# Patient Record
Sex: Male | Born: 1955
Health system: Southern US, Community
[De-identification: ages and names within clinical notes are randomized; demographics above are authoritative.]

## PROBLEM LIST (undated history)

## (undated) DIAGNOSIS — R7989 Other specified abnormal findings of blood chemistry: Secondary | ICD-10-CM

## (undated) DIAGNOSIS — J189 Pneumonia, unspecified organism: Secondary | ICD-10-CM

## (undated) DIAGNOSIS — I4891 Unspecified atrial fibrillation: Secondary | ICD-10-CM

## (undated) DIAGNOSIS — E119 Type 2 diabetes mellitus without complications: Secondary | ICD-10-CM

## (undated) DIAGNOSIS — K219 Gastro-esophageal reflux disease without esophagitis: Secondary | ICD-10-CM

## (undated) DIAGNOSIS — K760 Fatty (change of) liver, not elsewhere classified: Secondary | ICD-10-CM

## (undated) DIAGNOSIS — I509 Heart failure, unspecified: Secondary | ICD-10-CM

## (undated) DIAGNOSIS — G4730 Sleep apnea, unspecified: Secondary | ICD-10-CM

## (undated) DIAGNOSIS — I10 Essential (primary) hypertension: Secondary | ICD-10-CM

## (undated) DIAGNOSIS — R7303 Prediabetes: Secondary | ICD-10-CM

## (undated) DIAGNOSIS — D126 Benign neoplasm of colon, unspecified: Secondary | ICD-10-CM

## (undated) DIAGNOSIS — E559 Vitamin D deficiency, unspecified: Secondary | ICD-10-CM

## (undated) DIAGNOSIS — G4700 Insomnia, unspecified: Secondary | ICD-10-CM

## (undated) DIAGNOSIS — R0600 Dyspnea, unspecified: Secondary | ICD-10-CM

## (undated) DIAGNOSIS — R0602 Shortness of breath: Secondary | ICD-10-CM

## (undated) HISTORY — PX: OTHER SURGICAL HISTORY: SHX169

## (undated) HISTORY — PX: CHOLECYSTECTOMY: SHX55

## (undated) HISTORY — DX: Sleep apnea, unspecified: G47.30

## (undated) HISTORY — DX: Vitamin D deficiency, unspecified: E55.9

## (undated) HISTORY — DX: Prediabetes: R73.03

## (undated) HISTORY — DX: Shortness of breath: R06.02

## (undated) HISTORY — DX: Type 2 diabetes mellitus without complications: E11.9

## (undated) HISTORY — PX: COLONOSCOPY: SHX174

## (undated) HISTORY — DX: Benign neoplasm of colon, unspecified: D12.6

## (undated) HISTORY — DX: Other specified abnormal findings of blood chemistry: R79.89

## (undated) HISTORY — DX: Fatty (change of) liver, not elsewhere classified: K76.0

---

## 1998-03-03 ENCOUNTER — Emergency Department (HOSPITAL_COMMUNITY): Admission: EM | Admit: 1998-03-03 | Discharge: 1998-03-03 | Payer: Self-pay | Admitting: Emergency Medicine

## 1999-10-01 ENCOUNTER — Emergency Department (HOSPITAL_COMMUNITY): Admission: EM | Admit: 1999-10-01 | Discharge: 1999-10-01 | Payer: Self-pay | Admitting: *Deleted

## 2000-08-26 ENCOUNTER — Emergency Department (HOSPITAL_COMMUNITY): Admission: EM | Admit: 2000-08-26 | Discharge: 2000-08-26 | Payer: Self-pay | Admitting: Emergency Medicine

## 2000-08-26 ENCOUNTER — Encounter: Payer: Self-pay | Admitting: Emergency Medicine

## 2001-05-22 ENCOUNTER — Encounter: Payer: Self-pay | Admitting: Emergency Medicine

## 2001-05-22 ENCOUNTER — Emergency Department (HOSPITAL_COMMUNITY): Admission: EM | Admit: 2001-05-22 | Discharge: 2001-05-22 | Payer: Self-pay | Admitting: Emergency Medicine

## 2002-07-07 ENCOUNTER — Emergency Department (HOSPITAL_COMMUNITY): Admission: EM | Admit: 2002-07-07 | Discharge: 2002-07-07 | Payer: Self-pay

## 2003-09-07 ENCOUNTER — Emergency Department (HOSPITAL_COMMUNITY): Admission: EM | Admit: 2003-09-07 | Discharge: 2003-09-07 | Payer: Self-pay | Admitting: *Deleted

## 2003-09-09 ENCOUNTER — Emergency Department (HOSPITAL_COMMUNITY): Admission: EM | Admit: 2003-09-09 | Discharge: 2003-09-09 | Payer: Self-pay | Admitting: Emergency Medicine

## 2004-09-22 ENCOUNTER — Inpatient Hospital Stay (HOSPITAL_COMMUNITY): Admission: EM | Admit: 2004-09-22 | Discharge: 2004-09-24 | Payer: Self-pay | Admitting: Emergency Medicine

## 2004-10-23 ENCOUNTER — Emergency Department (HOSPITAL_COMMUNITY): Admission: EM | Admit: 2004-10-23 | Discharge: 2004-10-23 | Payer: Self-pay | Admitting: *Deleted

## 2005-03-27 ENCOUNTER — Emergency Department (HOSPITAL_COMMUNITY): Admission: EM | Admit: 2005-03-27 | Discharge: 2005-03-27 | Payer: Self-pay | Admitting: Emergency Medicine

## 2005-04-27 ENCOUNTER — Ambulatory Visit: Payer: Self-pay | Admitting: Internal Medicine

## 2005-05-04 ENCOUNTER — Ambulatory Visit: Payer: Self-pay | Admitting: Family Medicine

## 2005-05-04 ENCOUNTER — Ambulatory Visit: Payer: Self-pay | Admitting: Internal Medicine

## 2005-05-06 ENCOUNTER — Ambulatory Visit: Payer: Self-pay | Admitting: *Deleted

## 2005-05-11 ENCOUNTER — Ambulatory Visit: Payer: Self-pay | Admitting: Internal Medicine

## 2005-07-25 ENCOUNTER — Emergency Department (HOSPITAL_COMMUNITY): Admission: AD | Admit: 2005-07-25 | Discharge: 2005-07-25 | Payer: Self-pay | Admitting: Family Medicine

## 2006-01-24 ENCOUNTER — Ambulatory Visit: Payer: Self-pay | Admitting: Internal Medicine

## 2006-02-22 ENCOUNTER — Ambulatory Visit: Payer: Self-pay | Admitting: Internal Medicine

## 2006-04-16 ENCOUNTER — Emergency Department (HOSPITAL_COMMUNITY): Admission: EM | Admit: 2006-04-16 | Discharge: 2006-04-16 | Payer: Self-pay | Admitting: Emergency Medicine

## 2006-12-26 ENCOUNTER — Ambulatory Visit: Payer: Self-pay | Admitting: Family Medicine

## 2007-02-27 ENCOUNTER — Ambulatory Visit: Payer: Self-pay | Admitting: Family Medicine

## 2007-05-24 ENCOUNTER — Ambulatory Visit: Payer: Self-pay | Admitting: Internal Medicine

## 2007-05-24 LAB — CONVERTED CEMR LAB
ALT: 28 units/L (ref 0–53)
AST: 23 units/L (ref 0–37)
Chloride: 105 meq/L (ref 96–112)
Cholesterol: 188 mg/dL (ref 0–200)
Creatinine, Ser: 1.21 mg/dL (ref 0.40–1.50)
Glucose, Bld: 99 mg/dL (ref 70–99)
LDL Cholesterol: 67 mg/dL (ref 0–99)
Microalb, Ur: 7.87 mg/dL — ABNORMAL HIGH (ref 0.00–1.89)
Sodium: 138 meq/L (ref 135–145)
Total Bilirubin: 0.4 mg/dL (ref 0.3–1.2)
Total Protein: 7.2 g/dL (ref 6.0–8.3)

## 2007-06-28 ENCOUNTER — Encounter (INDEPENDENT_AMBULATORY_CARE_PROVIDER_SITE_OTHER): Payer: Self-pay | Admitting: *Deleted

## 2008-04-08 ENCOUNTER — Ambulatory Visit: Payer: Self-pay | Admitting: Internal Medicine

## 2008-04-08 ENCOUNTER — Encounter (INDEPENDENT_AMBULATORY_CARE_PROVIDER_SITE_OTHER): Payer: Self-pay | Admitting: Family Medicine

## 2008-04-08 LAB — CONVERTED CEMR LAB
BUN: 20 mg/dL (ref 6–23)
CO2: 21 meq/L (ref 19–32)
Calcium: 9.5 mg/dL (ref 8.4–10.5)
Cholesterol: 181 mg/dL (ref 0–200)
HDL: 60 mg/dL (ref >39)
Hemoglobin: 14.8 g/dL (ref 13.0–17.0)
LDL Cholesterol: 83 mg/dL (ref 0–99)
Lymphocytes Relative: 41 % (ref 12–46)
MCHC: 32.2 g/dL (ref 30.0–36.0)
MCV: 87.3 fL (ref 78.0–100.0)
Monocytes Relative: 8 % (ref 3–12)
Neutro Abs: 2.9 10*3/uL (ref 1.7–7.7)
Potassium: 4.3 meq/L (ref 3.5–5.3)
RDW: 15.5 % (ref 11.5–15.5)
Sodium: 140 meq/L (ref 135–145)
Total CHOL/HDL Ratio: 3
Triglycerides: 191 mg/dL — ABNORMAL HIGH (ref <150)
VLDL: 38 mg/dL (ref 0–40)
WBC: 6.1 10*3/uL (ref 4.0–10.5)

## 2008-04-11 ENCOUNTER — Ambulatory Visit (HOSPITAL_COMMUNITY): Admission: RE | Admit: 2008-04-11 | Discharge: 2008-04-11 | Payer: Self-pay | Admitting: Internal Medicine

## 2008-05-08 ENCOUNTER — Ambulatory Visit: Payer: Self-pay | Admitting: Internal Medicine

## 2009-05-07 ENCOUNTER — Ambulatory Visit: Payer: Self-pay | Admitting: Internal Medicine

## 2009-05-09 ENCOUNTER — Ambulatory Visit (HOSPITAL_COMMUNITY): Admission: RE | Admit: 2009-05-09 | Discharge: 2009-05-09 | Payer: Self-pay | Admitting: Internal Medicine

## 2009-05-21 ENCOUNTER — Ambulatory Visit: Payer: Self-pay | Admitting: Internal Medicine

## 2009-05-21 ENCOUNTER — Encounter (INDEPENDENT_AMBULATORY_CARE_PROVIDER_SITE_OTHER): Payer: Self-pay | Admitting: Adult Health

## 2009-05-21 LAB — CONVERTED CEMR LAB
Alkaline Phosphatase: 77 units/L (ref 39–117)
Basophils Relative: 1 % (ref 0–1)
CO2: 23 meq/L (ref 19–32)
Chloride: 108 meq/L (ref 96–112)
Eosinophils Absolute: 0.2 10*3/uL (ref 0.0–0.7)
Eosinophils Relative: 2 % (ref 0–5)
Hemoglobin: 14.8 g/dL (ref 13.0–17.0)
Lymphocytes Relative: 41 % (ref 12–46)
Monocytes Absolute: 0.6 10*3/uL (ref 0.1–1.0)
Monocytes Relative: 8 % (ref 3–12)
Neutro Abs: 3.6 10*3/uL (ref 1.7–7.7)
RBC: 5.4 M/uL (ref 4.22–5.81)
RDW: 16.7 % — ABNORMAL HIGH (ref 11.5–15.5)
Sodium: 140 meq/L (ref 135–145)
TSH: 0.869 microintl units/mL (ref 0.350–4.500)
Total CHOL/HDL Ratio: 3.4
Triglycerides: 159 mg/dL — ABNORMAL HIGH (ref <150)

## 2009-08-02 ENCOUNTER — Emergency Department (HOSPITAL_COMMUNITY): Admission: EM | Admit: 2009-08-02 | Discharge: 2009-08-02 | Payer: Self-pay | Admitting: Emergency Medicine

## 2009-08-03 ENCOUNTER — Emergency Department (HOSPITAL_COMMUNITY): Admission: EM | Admit: 2009-08-03 | Discharge: 2009-08-03 | Payer: Self-pay | Admitting: Emergency Medicine

## 2009-08-06 ENCOUNTER — Emergency Department (HOSPITAL_COMMUNITY): Admission: EM | Admit: 2009-08-06 | Discharge: 2009-08-06 | Payer: Self-pay | Admitting: Emergency Medicine

## 2009-08-08 ENCOUNTER — Ambulatory Visit: Payer: Self-pay | Admitting: Internal Medicine

## 2009-08-08 LAB — CONVERTED CEMR LAB
ALT: 200 units/L — ABNORMAL HIGH (ref 0–53)
Albumin: 3.9 g/dL (ref 3.5–5.2)
Alkaline Phosphatase: 191 units/L — ABNORMAL HIGH (ref 39–117)
BUN: 18 mg/dL (ref 6–23)
Chloride: 102 meq/L (ref 96–112)
Cholesterol: 149 mg/dL (ref 0–200)
Creatinine, Ser: 1 mg/dL (ref 0.40–1.50)
Glucose, Bld: 99 mg/dL (ref 70–99)
HDL: 41 mg/dL (ref >39)
LDL Cholesterol: 83 mg/dL (ref 0–99)
Potassium: 4.7 meq/L (ref 3.5–5.3)
Sodium: 139 meq/L (ref 135–145)
Total CHOL/HDL Ratio: 3.6
VLDL: 25 mg/dL (ref 0–40)

## 2009-08-12 ENCOUNTER — Encounter (INDEPENDENT_AMBULATORY_CARE_PROVIDER_SITE_OTHER): Payer: Self-pay | Admitting: Internal Medicine

## 2009-08-12 LAB — CONVERTED CEMR LAB: Hep B Core Total Ab: NEGATIVE

## 2010-04-03 ENCOUNTER — Ambulatory Visit: Payer: Self-pay | Admitting: Internal Medicine

## 2010-04-03 LAB — CONVERTED CEMR LAB
Alkaline Phosphatase: 81 units/L (ref 39–117)
CO2: 22 meq/L (ref 19–32)
Calcium: 9.2 mg/dL (ref 8.4–10.5)
Microalb, Ur: 0.84 mg/dL (ref 0.00–1.89)
Total Bilirubin: 0.3 mg/dL (ref 0.3–1.2)
Total CHOL/HDL Ratio: 4.3
Total Protein: 7.3 g/dL (ref 6.0–8.3)
Triglycerides: 375 mg/dL — ABNORMAL HIGH (ref <150)

## 2010-09-29 ENCOUNTER — Emergency Department (HOSPITAL_COMMUNITY)
Admission: EM | Admit: 2010-09-29 | Discharge: 2010-09-29 | Payer: Self-pay | Source: Home / Self Care | Admitting: Family Medicine

## 2011-02-24 ENCOUNTER — Other Ambulatory Visit: Payer: Self-pay | Admitting: Internal Medicine

## 2011-02-26 NOTE — Discharge Summary (Signed)
NAME:  Christopher Wilkins, Christopher Wilkins NO.:  [id number]   MEDICAL RECORD NO.:  [id number]          PATIENT TYPE:  INP   LOCATION:  0445                         FACILITY:  [facility]   PHYSICIAN:  Isidor Holts, M.D.  DATE OF BIRTH:  [birth year]   DATE OF ADMISSION:  09/22/2004  DATE OF DISCHARGE:  09/24/2004                                 DISCHARGE SUMMARY   PRIMARY CARE PHYSICIAN:  Unassigned.   DIAGNOSES:  1.  Acute pancreatitis.  2.  Hypertension.  3.  ETOH/substance abuse.   DISCHARGE MEDICATIONS:  1.  Protonix 40 mg p.o. daily for 1 week only.  2.  Lisinopril 40 mg p.o. daily.  3.  Ultram 50 mg p.o. p.r.n. q.8h.  One week's supply is dispensed.   PROCEDURE:  1.  Abdominal x-ray/chest x-ray dated September 22, 2004:  This shows normal      cardiac silhouette, mediastinum and pulmonary vasculature.  There is no      evidence of gas under either hemidiaphragm.  2.  Flat and upright abdominal images reveal normal stool and bowel gas      pattern.  No evidence of pneumoperitoneum.  Surgical clips are seen in      the region of the gallbladder.  Degenerative changes are seen in the      lumbar spine, i.e., unremarkable acute abdominal series.  3.  Pelvis, there is no gross organomegaly.   CONSULTATION:  None.   ADMISSION HISTORY:  As in H&P notes of September 22, 2004.  However, in  brief, this is a 55 year old male with a history of possible hypertension,  also status post laparoscopic cholecystectomy approximately 5 years ago,  presenting with upper abdominal pain of approximately 1 week's duration,  associated with vomiting and reduced appetite.  The patient also had a  transient episode of watery diarrhea which had resolved at the time of  admission.  He admitted to binge drinking and also smokes cocaine rolled up  in marijuana.  Initial laboratory test done in the emergency department  revealed elevated amylase at 277 and lipase at 108.  Abdominal series was  quite  unremarkable.  The patient was therefore admitted for further  evaluation, investigation, and treatment.   CLINICAL COURSE:  1.  Acute pancreatitis.  The patient was kept NPO initially, managed with      intravenous fluids, analgesics, antiemetics, and proton pump inhibitor      with satisfactory response.  Subsequently, abdominal pain subsided, and      no further episodes of emesis were noted.  The patient regained his      appetite and by September 23, 2004, was able to manage clear fluids with      gradual broadening to low fat diet which he also tolerated.   1.  Hypertension.  The patient's blood pressure at the time of admission was      mildly elevated at 152/94 mmHg.  He was therefore managed with ACE      inhibitor with gradual reduction of systolic BP to 144, as of September 24, 2004.  He has been recommended a low salt diet, and he is expected      to continue ACE inhibitor treatment until reevaluated by his PMD and      monitored on an outpatient basis.   1.  History of alcohol abuse/substance abuse.  The patient has been      counseled while in-hospital, and he has assured me that he will abstain      from these substances.  He understands that, in particular, alcohol may      be contributory to his episode of acute pancreatitis.   DISPOSITION:  The patient was subsequently discharged in satisfactory  condition on September 25, 2004.   ACTIVITY:  As tolerated.  He has been recommended out of work for  approximately 1week.   DIET:  A low fat diet for at least 1 week is recommended.   He has been cautioned to avoid alcohol and illegal substances.  He is to  call [facility] and establish a follow up appointment within 2 weeks.  He  has verbalized understanding.     Chri   CO/MEDQ  D:  09/28/2004  T:  09/28/2004  Job:  [id number]

## 2011-02-26 NOTE — H&P (Signed)
NAME:  Christopher Wilkins, Christopher Wilkins NO.:  [id number]   MEDICAL RECORD NO.:  [id number]          PATIENT TYPE:  EMS   LOCATION:  ED                           FACILITY:  [facility]   PHYSICIAN:  Isidor Holts, M.D.  DATE OF BIRTH:  Oct 11, 1956   DATE OF ADMISSION:  09/22/2004  DATE OF DISCHARGE:                                HISTORY & PHYSICAL   PMD:  Unassigned.   CHIEF COMPLAINT:  Abdominal pain/vomiting approximately one week.   HISTORY OF PRESENT ILLNESS:  This is a 55 year old male with no significant  past medical history who states that he developed upper abdominal pain  approximately one week ago, associated with vomiting and reduced appetite.  He also had watery diarrhea on September 21, 2004 now resolved.  The last  episode of vomiting was on September 21, 2004.  Denies clustering of cases or  any unusual foods.  Admits to binge drinking, usually 3-4 cans of beer on  the weekends. His last drink, however, was on September 21, 2004. He also  smoked cocaine rolled up with marijuana on September 21, 2004.  He called EMS  today as he was not getting any better.   PAST MEDICAL HISTORY:  1.  Status post laparoscopic cholecystectomy approximately five years ago.  2.  Query hypertension.   MEDICATIONS:  Not taking any regular medications.   ALLERGIES:  No known drug allergies, however, morphine causes nausea.   SOCIAL HISTORY:  Lives alone, divorced, has one offspring who is alive and  well, has two brothers and one sister, all of whom are alive and well. The  patient is a smoker and smokes approximately 1 pack of cigarettes per day  for the past 7-8 years, drinks alcohol on the weekends, about 3-4 cans of  beer according to patient. He also utilizes cocaine and marijuana.   FAMILY HISTORY:  Otherwise noncontributory.   REVIEW OF SYMPTOMS:  Essentially as per HPI and chief complaint otherwise  negative.   PHYSICAL EXAMINATION:  VITAL SIGNS:  Temperature 98.5,  blood  pressure  152/94, respiratory rate 22, pulse 77.  Pulse oximeter 97% on room air.  GENERAL:  Not in any acute distress. After analgesics are administered in  the emergency department, he is drowsy but is easily arousable. Not short of  breath at rest.  HEENT:  No clinical pallor or jaundice. No conjunctival injection.  NECK:  Supple.  JVD not seen.  No palpable lymphadenopathy, no palpable  goiter.  CHEST:  Clear to auscultation.  No wheezes, no crackles.  HEART:  Heart sounds 1 and 2 heard.  Normal rhythm, no murmurs.  ABDOMEN:  Moderately obese, soft, moderately tender in the epigastrium, no  guarding, no rebound.  No palpable organomegaly. Normal bowel sounds.  EXTREMITIES:  Lower extremity is quite unremarkable.  MUSCULOSKELETAL:  Unremarkable.  CENTRAL NERVOUS SYSTEM:  Reveals no focal neurologic deficits on gross  examination.   LABORATORY DATA:  CBC, WBC is 6.9, hemoglobin 15.3, hematocrit 46.0.  Platelets 320.  Electrolytes, sodium 139, potassium 3.7, chloride 108, CO2  26, BUN 7, creatinine 1.0, glucose  104.  AST 16, ALT 17, alkaline  phosphatase 73.  Amylase 277, lipase 108.   Abdominal series dated September 22, 2004 is quite unremarkable.   ASSESSMENT/PLAN:  1.  Acute pancreatitis. Will admit for observation and management. The      patient to be n.p.o. for now, apart from medications.  Will administer      intravenous fluids i.e. normal saline with potassium supplementation.      Also analgesics p.r.n.  Proton pump inhibitors and antiemetics as      needed. We will monitor amylase and lipase levels.   1.  Hypertension.  Mildly elevated. Will manage with ACE inhibitor.   1.  History of alcohol excess.  We will watch for withdrawal and administer      Ativan withdrawal protocol as needed.   Further management will depend on clinical course.     Chri   CO/MEDQ  D:  09/22/2004  T:  09/22/2004  Job:  [id number]

## 2011-09-28 ENCOUNTER — Other Ambulatory Visit (HOSPITAL_COMMUNITY): Payer: Self-pay | Admitting: Family Medicine

## 2011-09-28 ENCOUNTER — Ambulatory Visit (HOSPITAL_COMMUNITY)
Admission: RE | Admit: 2011-09-28 | Discharge: 2011-09-28 | Disposition: A | Discharge status: Home / Self Care | Payer: Self-pay | Source: Ambulatory Visit | Attending: Family Medicine | Admitting: Family Medicine

## 2011-09-28 DIAGNOSIS — M25569 Pain in unspecified knee: Secondary | ICD-10-CM | POA: Insufficient documentation

## 2011-09-28 DIAGNOSIS — R52 Pain, unspecified: Secondary | ICD-10-CM

## 2011-09-28 DIAGNOSIS — Z1810 Retained metal fragments, unspecified: Secondary | ICD-10-CM | POA: Insufficient documentation

## 2012-09-07 ENCOUNTER — Encounter (HOSPITAL_COMMUNITY): Payer: Self-pay | Admitting: Emergency Medicine

## 2012-09-07 ENCOUNTER — Emergency Department (HOSPITAL_COMMUNITY): Payer: Self-pay

## 2012-09-07 ENCOUNTER — Emergency Department (HOSPITAL_COMMUNITY)
Admission: EM | Admit: 2012-09-07 | Discharge: 2012-09-07 | Disposition: A | Discharge status: Home / Self Care | Payer: Self-pay | Attending: Emergency Medicine | Admitting: Emergency Medicine

## 2012-09-07 DIAGNOSIS — Z7982 Long term (current) use of aspirin: Secondary | ICD-10-CM | POA: Insufficient documentation

## 2012-09-07 DIAGNOSIS — R42 Dizziness and giddiness: Secondary | ICD-10-CM | POA: Insufficient documentation

## 2012-09-07 DIAGNOSIS — H538 Other visual disturbances: Secondary | ICD-10-CM | POA: Insufficient documentation

## 2012-09-07 DIAGNOSIS — I10 Essential (primary) hypertension: Secondary | ICD-10-CM | POA: Insufficient documentation

## 2012-09-07 DIAGNOSIS — R51 Headache: Secondary | ICD-10-CM

## 2012-09-07 DIAGNOSIS — F17200 Nicotine dependence, unspecified, uncomplicated: Secondary | ICD-10-CM | POA: Insufficient documentation

## 2012-09-07 DIAGNOSIS — Z79899 Other long term (current) drug therapy: Secondary | ICD-10-CM | POA: Insufficient documentation

## 2012-09-07 HISTORY — DX: Essential (primary) hypertension: I10

## 2012-09-07 MED ORDER — HYDROMORPHONE HCL PF 1 MG/ML IJ SOLN
0.5000 mg | Freq: Once | INTRAMUSCULAR | Status: AC
  Administered 2012-09-07: 0.5 mg via INTRAVENOUS
  Filled 2012-09-07: qty 1

## 2012-09-07 MED ORDER — METOCLOPRAMIDE HCL 5 MG/ML IJ SOLN
10.0000 mg | Freq: Once | INTRAMUSCULAR | Status: AC
  Administered 2012-09-07: 10 mg via INTRAVENOUS
  Filled 2012-09-07: qty 2

## 2012-09-07 MED ORDER — DIPHENHYDRAMINE HCL 50 MG/ML IJ SOLN
25.0000 mg | Freq: Once | INTRAMUSCULAR | Status: AC
  Administered 2012-09-07: 25 mg via INTRAVENOUS
  Filled 2012-09-07: qty 1

## 2012-09-07 MED ORDER — LISINOPRIL-HYDROCHLOROTHIAZIDE 20-12.5 MG PO TABS: 1.0000 | ORAL_TABLET | Freq: Every day | ORAL | Status: DC

## 2012-09-07 MED ORDER — IOHEXOL 350 MG/ML SOLN
50.0000 mL | Freq: Once | INTRAVENOUS | Status: AC | PRN
  Administered 2012-09-07: 50 mL via INTRAVENOUS

## 2012-09-07 NOTE — ED Notes (Signed)
Patient transported to CT 

## 2012-09-07 NOTE — ED Provider Notes (Signed)
History     CSN: [id number]  Arrival date & time 09/07/12  1003   First MD Initiated Contact with Patient 09/07/12 1015     Chief Complaint  Patient presents with  . Hypertension   HPI: Christopher Wilkins is a 56 yo AAM with history of HTN, who ran out of his blood pressure medications two weeks ago presents with acute onset occipital headache. He was watching TV when his headache started. Pain is located in the occiput, described as pressure, non-radiating, 9/10. No alleviating or exacerbating factors. He has not taken any medications for pain. Symptoms are associated with dizziness, described as lightheadedness, but not vertigo. Mild blurred vision but no double vision. He denies neck pain. He has had a HA similar to this previously several years ago when he first was diagnosed with hypertension, he is unsure of the results of this scan. He does not normally have headaches. He does not take any anticoagulants.   Past Medical History  Diagnosis Date  . Hypertension     Past Surgical History  Procedure Date  . Cholecystectomy   . Other surgical history     "Shot a couple of times"    No family history on file.  History  Substance Use Topics  . Smoking status: Current Every Day Smoker -- 0.2 packs/day    Types: Cigarettes  . Smokeless tobacco: Not on file  . Alcohol Use: Yes     Comment: occasionally     Review of Systems  Constitutional: Negative for fever, chills, appetite change and fatigue.  HENT: Negative for neck pain and neck stiffness.   Eyes: Positive for visual disturbance (blurred vision). Negative for photophobia.  Respiratory: Negative for cough and shortness of breath.   Cardiovascular: Negative for chest pain and palpitations.  Gastrointestinal: Negative for nausea, vomiting, abdominal pain and constipation.  Genitourinary: Negative for dysuria, frequency, decreased urine volume and enuresis.  Musculoskeletal: Negative for myalgias, arthralgias and gait problem.    Skin: Negative for pallor and rash.  Neurological: Positive for dizziness and headaches.  Psychiatric/Behavioral: Negative for confusion and agitation.  All other systems reviewed and are negative.   Allergies  Codeine  Home Medications   Current Outpatient Rx  Name  Route  Sig  Dispense  Refill  . ASPIRIN EC 81 MG PO TBEC   Oral   Take 81 mg by mouth daily.         Marland Kitchen LISINOPRIL-HYDROCHLOROTHIAZIDE 20-12.5 MG PO TABS   Oral   Take 1 tablet by mouth daily.           BP 175/94  Pulse 93  Temp 97.8 F (36.6 C) (Oral)  Resp 18  SpO2 96%  Physical Exam  Nursing note and vitals reviewed. Constitutional: He is oriented to person, place, and time. He appears well-developed. He is cooperative.       Overweight gentleman, appears uncomfortable.   HENT:  Head: Normocephalic and atraumatic.  Mouth/Throat: Mucous membranes are normal.  Eyes: Conjunctivae normal and EOM are normal. Pupils are equal, round, and reactive to light.  Neck: Trachea normal. No JVD present. Carotid bruit is not present.  Cardiovascular: Normal rate, regular rhythm, S1 normal, S2 normal and normal heart sounds.  Exam reveals no decreased pulses.   No murmur heard. Pulmonary/Chest: Effort normal and breath sounds normal. No respiratory distress. He has no decreased breath sounds.  Abdominal: Soft. Normal appearance and bowel sounds are normal. There is no tenderness.  Neurological: He is alert and oriented  to person, place, and time. He has normal strength and normal reflexes. No cranial nerve deficit or sensory deficit. He displays a negative Romberg sign. GCS eye subscore is 4. GCS verbal subscore is 5. GCS motor subscore is 6.   ED Course  Procedures  Labs Reviewed - No data to display Ct Angio Head W/cm &/or Wo Cm  09/07/2012  *RADIOLOGY REPORT*  Clinical Data:  Dizziness and headache  CT ANGIOGRAPHY HEAD AND NECK  Technique:  Multidetector CT imaging of the head and neck was performed using the  standard protocol during bolus administration of intravenous contrast.  Multiplanar CT image reconstructions including MIPs were obtained to evaluate the vascular anatomy. Carotid stenosis measurements (when applicable) are obtained utilizing NASCET criteria, using the distal internal carotid diameter as the denominator.  Contrast: 50mL OMNIPAQUE IOHEXOL 350 MG/ML SOLN  Comparison:  CT head 09/07/2012  CTA NECK  Findings:  Left carotid artery origin from the innominate artery, a normal variant.  Proximal great vessels are widely patent.  No mass is present in the neck.  There is disc degeneration and spondylosis throughout the cervical spine.  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  There is moderate spinal stenosis throughout the cervical spine.  No fracture or spinal mass is identified.  The carotid artery is widely patent bilaterally.  There is mild atherosclerotic plaque and mild calcification of the carotid bifurcation bilaterally without significant stenosis.  No carotid dissection.  Both vertebral arteries are  patent to the basilar. No significant carotid stenosis or dissection.   Review of the MIP images confirms the above findings.  IMPRESSION: No significant carotid or vertebral artery stenosis.  CTA HEAD  Findings:  Both vertebral arteries are patent to the basilar.  The basilar is widely patent.  Posterior cerebral arteries are patent bilaterally without stenosis.  Internal carotid artery is patent bilaterally without significant stenosis.  There is minimal atherosclerosis in the cavernous carotid.  Anterior and middle cerebral arteries are patent bilaterally without stenosis.  Negative for cerebral aneurysm.  Postcontrast images of the brain were obtained.  Normal enhancement pattern.  Ventricle size is normal.  No hemorrhage or acute infarct is identified.  There is significant chronic sinusitis in the paranasal sinuses.   Review of the MIP images confirms the above  findings.  IMPRESSION: No significant intracranial stenosis.  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  Chronic sinusitis.   Original Report Authenticated By: Janeece Riggers, M.D.    Ct Head Wo Contrast  09/07/2012  *RADIOLOGY REPORT*  Clinical Data: Headache, hypertension  CT HEAD WITHOUT CONTRAST  Technique:  Contiguous axial images were obtained from the base of the skull through the vertex without contrast.  Comparison: None.  Findings: No acute intracranial hemorrhage.  No focal mass lesion. No CT evidence of acute infarction.   No midline shift or mass effect.  No hydrocephalus.  Basilar cisterns are patent.  Extensive dural calcifications are noted.  There is mucosal thickening within the maxillary and ethmoid sinuses.  The frontal sinuses are occluded by mucosal thickening.  IMPRESSION: No acute intracranial findings.  Extensive chronic sinus inflammation involving the paranasal sinuses and frontal sinuses.   Original Report Authenticated By: Genevive Bi, M.D.    Ct Angio Neck W/cm &/or Wo/cm  09/07/2012  *RADIOLOGY REPORT*  Clinical Data:  Dizziness and headache  CT ANGIOGRAPHY HEAD AND NECK  Technique:  Multidetector CT imaging of the head and neck was performed using the standard  protocol during bolus administration of intravenous contrast.  Multiplanar CT image reconstructions including MIPs were obtained to evaluate the vascular anatomy. Carotid stenosis measurements (when applicable) are obtained utilizing NASCET criteria, using the distal internal carotid diameter as the denominator.  Contrast: 50mL OMNIPAQUE IOHEXOL 350 MG/ML SOLN  Comparison:  CT head 09/07/2012  CTA NECK  Findings:  Left carotid artery origin from the innominate artery, a normal variant.  Proximal great vessels are widely patent.  No mass is present in the neck.  There is disc degeneration and spondylosis throughout the cervical spine.  There is ossification of the posterior longitudinal ligament  at C4 and C5.  There is moderate spinal stenosis throughout the cervical spine.  No fracture or spinal mass is identified.  The carotid artery is widely patent bilaterally.  There is mild atherosclerotic plaque and mild calcification of the carotid bifurcation bilaterally without significant stenosis.  No carotid dissection.  Both vertebral arteries are  patent to the basilar. No significant carotid stenosis or dissection.   Review of the MIP images confirms the above findings.  IMPRESSION: No significant carotid or vertebral artery stenosis.  CTA HEAD  Findings:  Both vertebral arteries are patent to the basilar.  The basilar is widely patent.  Posterior cerebral arteries are patent bilaterally without stenosis.  Internal carotid artery is patent bilaterally without significant stenosis.  There is minimal atherosclerosis in the cavernous carotid.  Anterior and middle cerebral arteries are patent bilaterally without stenosis.  Negative for cerebral aneurysm.  Postcontrast images of the brain were obtained.  Normal enhancement pattern.  Ventricle size is normal.  No hemorrhage or acute infarct is identified.  There is significant chronic sinusitis in the paranasal sinuses.   Review of the MIP images confirms the above findings.  IMPRESSION: No significant intracranial stenosis.  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  Chronic sinusitis.   Original Report Authenticated By: Janeece Riggers, M.D.    No diagnosis found.  MDM   56 yo AAM with history of HTN, who ran out of his blood pressure medications two weeks ago presents with acute onset occipital headache. Afebrile, mildly hypertensive on arrival. Doubt hypertensive emergency as no neurologic deficit, lungs clear, abdomen benign. Doubt angle closure glaucoma as PERRL, no eye pain. Symptoms concerning for IPH vs subarachnoid vs vertebral artery occlussion or dissection due to acute onset. Initial head CT without acute findings. Evaluated  with CTA of the head and neck, both of which were negative for acute findings. Treated with migraine cocktail and dilaudid with resolution of HA. Unknown cause of symptoms but significant intracranial vascular pathology excluded. He remained HD stable while in the ED, blood pressure trended down to 147/90 prior to DC without intervention. No neurologic deficits evolved. Felt to be stable for outpatient management as symptoms resolved, no significant abnormality of w/u or physical exam, HD stable. Return precautions to include recurrent HA, any neurologic deficits or other concerning symptoms were given. Patient in agreement with plan and voiced understanding. Rx for hypertension med and list of PCP's given.   Reviewed imaging and previous medical records, utilized in MDM  Discussed case with Dr. Patria Mane.   ECG: SR, rate 83, PAC's, normal axis, mild flattening of T waves in V1-V2, T wave inversion aVL, no STE or depression. No previous ECG for comparison,   Clinical Impression 1. Acute headache       Margie Billet, MD 09/07/12 1659

## 2012-09-07 NOTE — ED Notes (Signed)
Pt stated that he has not had his BP medication in 2 weeks because he ran out of medication and has not been able to find a new doctor to prescribe medication. Pt c/o headache in the back of his head that started this morning. Stated that he has some blurry vision that started when he walked in the ED.

## 2012-09-08 NOTE — ED Provider Notes (Signed)
I saw and evaluated the patient, reviewed the resident's note and I agree with the findings and plan.  Pt is well appearing. Normal neuro exam. CT and CTA head without abnormality. Resolution of HA after treatment in ER. Doubt SAH. Dc home in good condition, pcp follow up  Lyanne Co, MD 09/08/12 [phone]

## 2013-03-21 ENCOUNTER — Emergency Department (HOSPITAL_COMMUNITY)
Admission: EM | Admit: 2013-03-21 | Discharge: 2013-03-21 | Disposition: A | Discharge status: Home / Self Care | Payer: BC Managed Care – PPO | Attending: Emergency Medicine | Admitting: Emergency Medicine

## 2013-03-21 ENCOUNTER — Encounter (HOSPITAL_COMMUNITY): Payer: Self-pay | Admitting: Emergency Medicine

## 2013-03-21 ENCOUNTER — Emergency Department (HOSPITAL_COMMUNITY): Payer: BC Managed Care – PPO

## 2013-03-21 DIAGNOSIS — S1096XA Insect bite of unspecified part of neck, initial encounter: Secondary | ICD-10-CM | POA: Insufficient documentation

## 2013-03-21 DIAGNOSIS — J189 Pneumonia, unspecified organism: Secondary | ICD-10-CM

## 2013-03-21 DIAGNOSIS — R0789 Other chest pain: Secondary | ICD-10-CM | POA: Insufficient documentation

## 2013-03-21 DIAGNOSIS — Z885 Allergy status to narcotic agent status: Secondary | ICD-10-CM | POA: Insufficient documentation

## 2013-03-21 DIAGNOSIS — J159 Unspecified bacterial pneumonia: Secondary | ICD-10-CM | POA: Insufficient documentation

## 2013-03-21 DIAGNOSIS — R05 Cough: Secondary | ICD-10-CM | POA: Insufficient documentation

## 2013-03-21 DIAGNOSIS — Z79899 Other long term (current) drug therapy: Secondary | ICD-10-CM | POA: Insufficient documentation

## 2013-03-21 DIAGNOSIS — F17200 Nicotine dependence, unspecified, uncomplicated: Secondary | ICD-10-CM | POA: Insufficient documentation

## 2013-03-21 DIAGNOSIS — R059 Cough, unspecified: Secondary | ICD-10-CM | POA: Insufficient documentation

## 2013-03-21 DIAGNOSIS — Y92009 Unspecified place in unspecified non-institutional (private) residence as the place of occurrence of the external cause: Secondary | ICD-10-CM | POA: Insufficient documentation

## 2013-03-21 DIAGNOSIS — W57XXXA Bitten or stung by nonvenomous insect and other nonvenomous arthropods, initial encounter: Secondary | ICD-10-CM | POA: Insufficient documentation

## 2013-03-21 DIAGNOSIS — R509 Fever, unspecified: Secondary | ICD-10-CM | POA: Insufficient documentation

## 2013-03-21 DIAGNOSIS — I10 Essential (primary) hypertension: Secondary | ICD-10-CM | POA: Insufficient documentation

## 2013-03-21 DIAGNOSIS — Y939 Activity, unspecified: Secondary | ICD-10-CM | POA: Insufficient documentation

## 2013-03-21 LAB — CBC
HCT: 41.3 % (ref 39.0–52.0)
MCH: 28.9 pg (ref 26.0–34.0)
MCV: 82.9 fL (ref 78.0–100.0)
Platelets: 272 10*3/uL (ref 150–400)
RDW: 15.1 % (ref 11.5–15.5)
WBC: 12.5 10*3/uL — ABNORMAL HIGH (ref 4.0–10.5)

## 2013-03-21 LAB — BASIC METABOLIC PANEL
BUN: 19 mg/dL (ref 6–23)
Calcium: 9.2 mg/dL (ref 8.4–10.5)
Chloride: 104 mEq/L (ref 96–112)
Creatinine, Ser: 1.67 mg/dL — ABNORMAL HIGH (ref 0.50–1.35)
GFR calc Af Amer: 51 mL/min — ABNORMAL LOW (ref >90)

## 2013-03-21 MED ORDER — HYDROCOD POLST-CHLORPHEN POLST 10-8 MG/5ML PO LQCR: 5.0000 mL | Freq: Two times a day (BID) | ORAL | Status: DC | PRN

## 2013-03-21 MED ORDER — AZITHROMYCIN 250 MG PO TABS: ORAL_TABLET | ORAL | Status: DC

## 2013-03-21 NOTE — ED Notes (Signed)
Pt reports he was doing yard work when something landed on the top of his head 2 days ago. Pt reports immediately after it happened he pulled it off his head and sat down. Ever since then he has been feeling generalized weakness, intermittent fevers and just not himself. Pt has equal hand grips, no facial droop, ambulatory to room. Pt in nad, skin warm and dry, resp e/u.

## 2013-03-21 NOTE — ED Provider Notes (Signed)
History     CSN: [id number]  Arrival date & time 03/21/13  4098   First MD Initiated Contact with Patient 03/21/13 1156      Chief Complaint  Patient presents with  . Insect Bite  . Generalized Body Aches    (Consider location/radiation/quality/duration/timing/severity/associated sxs/prior treatment) Patient is a 57 y.o. male presenting with fever. The history is provided by the patient.  Fever Max temp prior to arrival:  Pt was bitten by insects on the top of his head 2 days ago.  He did not see what kind of insect it was but the bites were painful.  Since then had has developed a cough with green sputum , has felt hot flashes, like a fever, and had excessive sweating.  He felt pressure in his chest like a 20 lb. brick on his chest.  He took no medicine for these symptoms. Temp source:  Subjective Severity:  Moderate Onset quality:  Gradual Duration:  2 days Timing:  Intermittent Progression:  Worsening Chronicity:  New Relieved by:  Nothing Worsened by:  Nothing tried Ineffective treatments:  None tried Associated symptoms: chest pain and cough   Associated symptoms: no diarrhea, no sore throat and no vomiting   Associated symptoms comment:  These symptoms seemed to start after insect bites on his scalp.   Past Medical History  Diagnosis Date  . Hypertension     Past Surgical History  Procedure Laterality Date  . Cholecystectomy    . Other surgical history      "Shot a couple of times"    History reviewed. No pertinent family history.  History  Substance Use Topics  . Smoking status: Current Every Day Smoker -- 0.25 packs/day    Types: Cigarettes  . Smokeless tobacco: Not on file  . Alcohol Use: Yes     Comment: occasionally      Review of Systems  Constitutional: Positive for fever.  HENT: Negative.  Negative for sore throat.   Respiratory: Positive for cough. Negative for wheezing.   Cardiovascular: Positive for chest pain. Negative for palpitations  and leg swelling.  Gastrointestinal: Negative.  Negative for vomiting and diarrhea.  Genitourinary: Negative.   Musculoskeletal: Negative.   Skin:       Insect bites on the scalp.   Neurological: Negative.   Psychiatric/Behavioral: Negative.     Allergies  Codeine  Home Medications   Current Outpatient Rx  Name  Route  Sig  Dispense  Refill  . amitriptyline (ELAVIL) 75 MG tablet   Oral   Take 75 mg by mouth at bedtime.         Marland Kitchen amLODipine (NORVASC) 10 MG tablet   Oral   Take 10 mg by mouth daily.         Marland Kitchen lisinopril-hydrochlorothiazide (PRINZIDE) 20-12.5 MG per tablet   Oral   Take 1 tablet by mouth daily.   30 tablet   0   . meloxicam (MOBIC) 15 MG tablet   Oral   Take 15 mg by mouth daily.           BP 111/62  Pulse 92  Temp(Src) 98.1 F (36.7 C) (Oral)  Resp 18  SpO2 94%  Physical Exam  Nursing note and vitals reviewed. Constitutional: He is oriented to person, place, and time. He appears well-developed and well-nourished. No distress.  HENT:  Head: Normocephalic and atraumatic.  Right Ear: External ear normal.  Left Ear: External ear normal.  Mouth/Throat: Oropharynx is clear and moist.  He  has two areas with scabs 2 mm in diameter at the vertex of the scalp, where he says he was bitten twice by insects.  There is no associated redness, swelling or heat.  Eyes: Conjunctivae and EOM are normal. Pupils are equal, round, and reactive to light.  Neck: Normal range of motion. Neck supple.  Cardiovascular: Normal rate, regular rhythm and normal heart sounds.   Pulmonary/Chest: Effort normal and breath sounds normal.  Abdominal: Soft. Bowel sounds are normal.  Musculoskeletal: Normal range of motion. He exhibits no edema and no tenderness.  Neurological: He is alert and oriented to person, place, and time.  No sensory or motor deficit.  Skin: Skin is warm and dry.  Psychiatric: He has a normal mood and affect. His behavior is normal.    ED Course   Procedures (including critical care time)  Labs Reviewed  CBC  BASIC METABOLIC PANEL  ROCKY MTN SPOTTED FVR AB, IGG-BLOOD   Dg Chest 2 View  03/21/2013   *RADIOLOGY REPORT*  Clinical Data: Insect bite with body aches and muscle pain.  Chest tightness and fever.  CHEST - 2 VIEW  Comparison: None.  Findings: Trachea is midline.  Heart size is accentuated by low lung volumes.  There is left basilar airspace disease.  No pleural fluid.  IMPRESSION: Low lung volumes with left basilar air space disease, possibly due to pneumonia.  Follow-up to clearing is recommended.   Original Report Authenticated By: Leanna Battles, M.D.     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  ST/T Wave abnormalities: normal  Conduction Disutrbances:none  Narrative Interpretation: Abnormal EKG  Old EKG Reviewed: unchanged  12:25 PM Pt was seen and had physical examination.  Lab workup was ordered.  1:33 PM Results for orders placed during the hospital encounter of 03/21/13  CBC      Result Value Range   WBC 12.5 (*) 4.0 - 10.5 K/uL   RBC 4.98  4.22 - 5.81 MIL/uL   Hemoglobin 14.4  13.0 - 17.0 g/dL   HCT 16.1  09.6 - 04.5 %   MCV 82.9  78.0 - 100.0 fL   MCH 28.9  26.0 - 34.0 pg   MCHC 34.9  30.0 - 36.0 g/dL   RDW 40.9  81.1 - 91.4 %   Platelets 272  150 - 400 K/uL  BASIC METABOLIC PANEL      Result Value Range   Sodium 141  135 - 145 mEq/L   Potassium 3.4 (*) 3.5 - 5.1 mEq/L   Chloride 104  96 - 112 mEq/L   CO2 26  19 - 32 mEq/L   Glucose, Bld 102 (*) 70 - 99 mg/dL   BUN 19  6 - 23 mg/dL   Creatinine, Ser 7.82 (*) 0.50 - 1.35 mg/dL   Calcium 9.2  8.4 - 95.6 mg/dL   GFR calc non Af Amer 44 (*) >90 mL/min   GFR calc Af Amer 51 (*) >90 mL/min  POCT I-STAT TROPONIN I      Result Value Range   Troponin i, poc 0.00  0.00 - 0.08 ng/mL   Comment 3             Dg Chest 2 View  03/21/2013   *RADIOLOGY REPORT*  Clinical Data: Insect bite with body aches and muscle pain.  Chest tightness and fever.  CHEST - 2  VIEW  Comparison: None.  Findings: Trachea is midline.  Heart size is accentuated by low lung volumes.  There is left basilar airspace disease.  No pleural fluid.  IMPRESSION: Low lung volumes with left basilar air space disease, possibly due to pneumonia.  Follow-up to clearing is recommended.   Original Report Authenticated By: Leanna Battles, M.D.    Workup suggests he has a small focus of community-acquired pneumonia.  Will Rx with azithromycin, Tussionex.  1. CAP (community acquired pneumonia)          Carleene Cooper III, MD 03/21/13 1348

## 2013-03-21 NOTE — ED Notes (Signed)
Lab at bedside

## 2013-03-21 NOTE — Discharge Instructions (Signed)

## 2013-03-21 NOTE — ED Notes (Signed)
Pt sts bit by bug on top of head 2 days ago and now having generalized body aches and chest pressure and cramping in extremities

## 2013-03-21 NOTE — ED Notes (Signed)
Pt denies CP but sts he feels like it is heavy that started last night. Pt denies cardiac hx.

## 2013-03-23 NOTE — ED Notes (Signed)
Chart sent to EDP office for review.+ RMSF

## 2013-03-25 ENCOUNTER — Telehealth (HOSPITAL_COMMUNITY): Payer: Self-pay | Admitting: Emergency Medicine

## 2013-03-25 NOTE — ED Notes (Signed)
Chart reviewed by Trixie Dredge PA "Give Doxycycline 100 mg po BID x 10 days.  Please follow up with PCP.  Return to ER for worsening symptoms"  6/15 @ 1710 LVM requesting callback (H)#.  Mobile # "wrong # or incorrect code"

## 2013-03-26 ENCOUNTER — Telehealth (HOSPITAL_COMMUNITY): Payer: Self-pay | Admitting: Emergency Medicine

## 2013-04-21 ENCOUNTER — Telehealth (HOSPITAL_COMMUNITY): Payer: Self-pay | Admitting: Emergency Medicine

## 2013-04-21 NOTE — ED Notes (Signed)
No response to letter sent after 30 days. Chart sent to Medical Records. °

## 2014-07-25 ENCOUNTER — Encounter (HOSPITAL_COMMUNITY): Payer: Self-pay | Admitting: Emergency Medicine

## 2014-07-25 ENCOUNTER — Emergency Department (HOSPITAL_COMMUNITY): Payer: BC Managed Care – PPO

## 2014-07-25 ENCOUNTER — Emergency Department (HOSPITAL_COMMUNITY)
Admission: EM | Admit: 2014-07-25 | Discharge: 2014-07-25 | Disposition: A | Discharge status: Home / Self Care | Payer: BC Managed Care – PPO | Attending: Emergency Medicine | Admitting: Emergency Medicine

## 2014-07-25 DIAGNOSIS — M791 Myalgia, unspecified site: Secondary | ICD-10-CM

## 2014-07-25 DIAGNOSIS — R630 Anorexia: Secondary | ICD-10-CM | POA: Insufficient documentation

## 2014-07-25 DIAGNOSIS — M549 Dorsalgia, unspecified: Secondary | ICD-10-CM

## 2014-07-25 DIAGNOSIS — R079 Chest pain, unspecified: Secondary | ICD-10-CM

## 2014-07-25 DIAGNOSIS — R109 Unspecified abdominal pain: Secondary | ICD-10-CM | POA: Insufficient documentation

## 2014-07-25 DIAGNOSIS — Z77120 Contact with and (suspected) exposure to mold (toxic): Secondary | ICD-10-CM | POA: Insufficient documentation

## 2014-07-25 DIAGNOSIS — R51 Headache: Secondary | ICD-10-CM | POA: Insufficient documentation

## 2014-07-25 DIAGNOSIS — Z720 Tobacco use: Secondary | ICD-10-CM | POA: Insufficient documentation

## 2014-07-25 DIAGNOSIS — Z79899 Other long term (current) drug therapy: Secondary | ICD-10-CM | POA: Insufficient documentation

## 2014-07-25 DIAGNOSIS — R6883 Chills (without fever): Secondary | ICD-10-CM | POA: Insufficient documentation

## 2014-07-25 DIAGNOSIS — R059 Cough, unspecified: Secondary | ICD-10-CM

## 2014-07-25 DIAGNOSIS — I10 Essential (primary) hypertension: Secondary | ICD-10-CM | POA: Insufficient documentation

## 2014-07-25 DIAGNOSIS — R1110 Vomiting, unspecified: Secondary | ICD-10-CM | POA: Insufficient documentation

## 2014-07-25 DIAGNOSIS — R05 Cough: Secondary | ICD-10-CM

## 2014-07-25 DIAGNOSIS — J3489 Other specified disorders of nose and nasal sinuses: Secondary | ICD-10-CM | POA: Insufficient documentation

## 2014-07-25 LAB — I-STAT TROPONIN, ED: TROPONIN I, POC: 0 ng/mL (ref 0.00–0.08)

## 2014-07-25 LAB — BASIC METABOLIC PANEL
ANION GAP: 12 (ref 5–15)
BUN: 14 mg/dL (ref 6–23)
CO2: 24 mEq/L (ref 19–32)
Calcium: 8.9 mg/dL (ref 8.4–10.5)
Chloride: 103 mEq/L (ref 96–112)
Creatinine, Ser: 1.1 mg/dL (ref 0.50–1.35)
GFR, EST AFRICAN AMERICAN: 83 mL/min — AB (ref >90)
GFR, EST NON AFRICAN AMERICAN: 72 mL/min — AB (ref >90)
GLUCOSE: 99 mg/dL (ref 70–99)
POTASSIUM: 4 meq/L (ref 3.7–5.3)
SODIUM: 139 meq/L (ref 137–147)

## 2014-07-25 LAB — CBC
HCT: 47.5 % (ref 39.0–52.0)
HEMOGLOBIN: 15.9 g/dL (ref 13.0–17.0)
MCH: 28 pg (ref 26.0–34.0)
MCHC: 33.5 g/dL (ref 30.0–36.0)
MCV: 83.6 fL (ref 78.0–100.0)
PLATELETS: 234 10*3/uL (ref 150–400)
RBC: 5.68 MIL/uL (ref 4.22–5.81)
RDW: 15.9 % — ABNORMAL HIGH (ref 11.5–15.5)
WBC: 3.4 10*3/uL — ABNORMAL LOW (ref 4.0–10.5)

## 2014-07-25 NOTE — ED Notes (Signed)
Pt. Reports having mold in his house for a few months. In the last 2 days he has developed chest pain, nasal congestion, abdominal pain, lower back pain with kidney pain and a headache.  ECG completed in Triage.  Pt. Is alert and oriented X3.  Pt is in NAD. Skin is warm and dry.

## 2014-07-25 NOTE — ED Provider Notes (Signed)
CSN: [id number]     Arrival date & time 07/25/14  1344 History   First MD Initiated Contact with Patient 07/25/14 1706     Chief Complaint  Patient presents with  . Chest Pain    abdominal pain, head pain, stuffiness lower back pain, abdominal pain      Patient is a 58 y.o. male presenting with chest pain. The history is provided by the patient.  Chest Pain Pain quality: aching   Pain radiates to:  Does not radiate Pain severity:  Mild Onset quality:  Gradual Timing:  Intermittent Progression:  Unchanged Relieved by: leaving his house. Worsened by:  Nothing tried Associated symptoms: abdominal pain, back pain, cough, headache and vomiting    Patient presents for multiple complaints: Pt reports ever since he discovered "black and green mold" in his house over a month ago he has had intermittent headaches, sinus pain/congestion, back pain, abdominal pain, diffuse joint pain as well mild, intermittent chest pain.  He also reports dry cough.  He is attempting to f/u with PCP but has not done so as of yet He does reports recent vomiting and lack of appetite  He denies exertional CP - he reports CP occurs at random He reports most symptoms will improve when he leaves his house   Past Medical History  Diagnosis Date  . Hypertension    Past Surgical History  Procedure Laterality Date  . Cholecystectomy    . Other surgical history      "Shot a couple of times"   No family history on file. History  Substance Use Topics  . Smoking status: Current Every Day Smoker -- 0.25 packs/day    Types: Cigarettes  . Smokeless tobacco: Not on file  . Alcohol Use: Yes     Comment: occasionally    Review of Systems  Constitutional: Positive for chills.  HENT: Positive for sinus pressure.   Respiratory: Positive for cough.   Cardiovascular: Positive for chest pain.  Gastrointestinal: Positive for vomiting and abdominal pain.  Musculoskeletal: Positive for back pain and myalgias.   Neurological: Positive for headaches.  All other systems reviewed and are negative.     Allergies  Codeine  Home Medications   Prior to Admission medications   Medication Sig Start Date End Date Taking? Authorizing Provider  lisinopril-hydrochlorothiazide (PRINZIDE) 20-12.5 MG per tablet Take 1 tablet by mouth daily. 09/07/12  Yes Louretta Shorten, MD   BP 157/84  Pulse 79  Temp(Src) 98.1 F (36.7 C) (Oral)  Resp 17  SpO2 100% Physical Exam CONSTITUTIONAL: Well developed/well nourished HEAD: Normocephalic/atraumatic EYES: EOMI/PERRL ENMT: Mucous membranes moist NECK: supple no meningeal signs SPINE:entire spine nontender CV: S1/S2 noted, no murmurs/rubs/gallops noted LUNGS: Lungs are clear to auscultation bilaterally, no apparent distress ABDOMEN: soft, nontender, no rebound or guarding GU:no cva tenderness NEURO: Pt is awake/alert, moves all extremitiesx4 No arm/leg drift No facial droop No ataxia EXTREMITIES: pulses normal, full ROM SKIN: warm, color normal PSYCH: no abnormalities of mood noted  ED Course  Procedures pt with multiple issues ongoing for over a month.   I doubt CVA/ACS or other acute cardiopulmonary/neurologic emergency Advised f/u with PCP as outpatient Advised need for close recheck of his BP  Labs Review Labs Reviewed  CBC - Abnormal; Notable for the following:    WBC 3.4 (*)    RDW 15.9 (*)    All other components within normal limits  BASIC METABOLIC PANEL - Abnormal; Notable for the following:    GFR calc non  Af Amer 72 (*)    GFR calc Af Amer 83 (*)    All other components within normal limits  I-STAT TROPOININ, ED    Imaging Review Dg Chest 2 View  07/25/2014   CLINICAL DATA:  Cough and chest congestion for 3 days. Intermittent fever. Nausea and vomiting. Chest pain. Abdominal pain.  EXAM: CHEST  2 VIEW  COMPARISON:  03/21/2013  FINDINGS: There is slight peribronchial thickening consistent with bronchitis. Lungs are otherwise  clear. Heart size and vascularity are normal. No effusions. No osseous abnormality.  IMPRESSION: Bronchitic changes.   Electronically Signed   By: Rozetta Nunnery M.D.   On: 07/25/2014 15:12     EKG Interpretation   Date/Time:  Thursday July 25 2014 13:53:07 EDT Ventricular Rate:  88 PR Interval:  150 QRS Duration: 86 QT Interval:  380 QTC Calculation: 459 R Axis:   53 Text Interpretation:  Sinus rhythm with marked sinus arrhythmia Otherwise  normal ECG No significant change since last tracing Confirmed by Christy Gentles   MD, Elenore Rota ([id number]) on 07/25/2014 5:07:08 PM      MDM   Final diagnoses:  Chest pain, unspecified chest pain type  Essential hypertension  Myalgia  Cough  Back pain, unspecified location  Exposure to mold    Nursing notes including past medical history and social history reviewed and considered in documentation Labs/vital reviewed and considered xrays reviewed and considered     Sharyon Cable, MD 07/25/14 1729

## 2014-07-25 NOTE — Discharge Instructions (Signed)
PLEASE FOLLOWUP WITH YOUR DOCTOR NEXT WEEK  Your caregiver has diagnosed you as having chest pain that is not specific for one problem, but does not require admission.  Chest pain comes from many different causes.  SEEK IMMEDIATE MEDICAL ATTENTION IF: You have severe chest pain, especially if the pain is crushing or pressure-like and spreads to the arms, back, neck, or jaw, or if you have sweating, nausea (feeling sick to your stomach), or shortness of breath. THIS IS AN EMERGENCY. Don't wait to see if the pain will go away. Get medical help at once. Call 911 or 0 (operator). DO NOT drive yourself to the hospital.  Your chest pain gets worse and does not go away with rest.  You have an attack of chest pain lasting longer than usual, despite rest and treatment with the medications your caregiver has prescribed.  You wake from sleep with chest pain or shortness of breath.  You feel dizzy or faint.  You have chest pain not typical of your usual pain for which you originally saw your caregiver.   You are having a headache. No specific cause was found today for your headache. It may have been a migraine or other cause of headache. Stress, anxiety, fatigue, and depression are common triggers for headaches. Your headache today does not appear to be life-threatening or require hospitalization, but often the exact cause of headaches is not determined in the emergency department. Therefore, follow-up with your doctor is very important to find out what may have caused your headache, and whether or not you need any further diagnostic testing or treatment. Sometimes headaches can appear benign (not harmful), but then more serious symptoms can develop which should prompt an immediate re-evaluation by your doctor or the emergency department.  SEEK MEDICAL ATTENTION IF:  You develop possible problems with medications prescribed.  The medications don't resolve your headache, if it recurs , or if you have multiple  episodes of vomiting or can't take fluids. You have a change from the usual headache.  RETURN IMMEDIATELY IF you develop a sudden, severe headache or confusion, become poorly responsive or faint, develop a fever above 100.47F or problem breathing, have a change in speech, vision, swallowing, or understanding, or develop new weakness, numbness, tingling, incoordination, or have a seizure.   SEEK IMMEDIATE MEDICAL ATTENTION IF: New numbness, tingling, weakness, or problem with the use of your arms or legs.  Severe back pain not relieved with medications.  Change in bowel or bladder control (if you lose control of stool or urine, or if you are unable to urinate) Increasing pain in any areas of the body (such as chest or abdominal pain).  Shortness of breath, dizziness or fainting.  Nausea (feeling sick to your stomach), vomiting, fever, or sweats.

## 2015-02-28 ENCOUNTER — Ambulatory Visit (AMBULATORY_SURGERY_CENTER): Payer: Self-pay

## 2015-02-28 VITALS — Ht 66.0 in | Wt 232.6 lb

## 2015-02-28 DIAGNOSIS — Z1211 Encounter for screening for malignant neoplasm of colon: Secondary | ICD-10-CM

## 2015-02-28 MED ORDER — SUPREP BOWEL PREP KIT 17.5-3.13-1.6 GM/177ML PO SOLN: 1.0000 | Freq: Once | ORAL | Status: DC

## 2015-02-28 NOTE — Progress Notes (Signed)
No allergies to eggs or soy No diet/weight loss meds No home oxygen No past problem with anesthesia  No email (doesn't remember password)

## 2015-03-13 ENCOUNTER — Ambulatory Visit (AMBULATORY_SURGERY_CENTER): Payer: BLUE CROSS/BLUE SHIELD | Admitting: Internal Medicine

## 2015-03-13 ENCOUNTER — Encounter: Payer: Self-pay | Admitting: Internal Medicine

## 2015-03-13 VITALS — BP 131/77 | HR 78 | Temp 96.0°F | Resp 18 | Ht 66.0 in | Wt 232.0 lb

## 2015-03-13 DIAGNOSIS — K635 Polyp of colon: Secondary | ICD-10-CM

## 2015-03-13 DIAGNOSIS — Z1211 Encounter for screening for malignant neoplasm of colon: Secondary | ICD-10-CM

## 2015-03-13 DIAGNOSIS — D123 Benign neoplasm of transverse colon: Secondary | ICD-10-CM | POA: Diagnosis not present

## 2015-03-13 DIAGNOSIS — D127 Benign neoplasm of rectosigmoid junction: Secondary | ICD-10-CM | POA: Diagnosis not present

## 2015-03-13 DIAGNOSIS — D125 Benign neoplasm of sigmoid colon: Secondary | ICD-10-CM | POA: Diagnosis not present

## 2015-03-13 DIAGNOSIS — K621 Rectal polyp: Secondary | ICD-10-CM

## 2015-03-13 DIAGNOSIS — D128 Benign neoplasm of rectum: Secondary | ICD-10-CM

## 2015-03-13 MED ORDER — SODIUM CHLORIDE 0.9 % IV SOLN: 500.0000 mL | INTRAVENOUS | Status: DC

## 2015-03-13 NOTE — Progress Notes (Signed)
Transferred to recovery room. A/O x3, pleased with MAC.  VSS.  Report to Jill, RN. 

## 2015-03-13 NOTE — Op Note (Signed)
[facility]  [address] [place], [id number]   COLONOSCOPY PROCEDURE REPORT  PATIENT: Christopher Wilkins, Christopher Wilkins  MR#: [id number] BIRTHDATE: [birth year] , 63  yrs. old GENDER: male ENDOSCOPIST: Jerene Bears, MD REFERRED BF:XOVA Marlou Sa, M.D. PROCEDURE DATE:  03/13/2015 PROCEDURE:   Colonoscopy, screening and Colonoscopy with snare polypectomy First Screening Colonoscopy - Avg.  risk and is 50 yrs.  old or older Yes.  Prior Negative Screening - Now for repeat screening. N/A  History of Adenoma - Now for follow-up colonoscopy & has been > or = to 3 yrs.  N/A  Polyps removed today? Yes ASA CLASS:   Class II INDICATIONS:Screening for colonic neoplasia and Colorectal Neoplasm Risk Assessment for this procedure is average risk. MEDICATIONS: Propofol 350 mg IV and Monitored anesthesia care  DESCRIPTION OF PROCEDURE:   After the risks benefits and alternatives of the procedure were thoroughly explained, informed consent was obtained.  The digital rectal exam revealed no rectal mass.   The LB [id number]  endoscope was introduced through the anus and advanced to the cecum, which was identified by both the appendix and ileocecal valve. No adverse events experienced. The quality of the prep was good.  (Suprep was used)  The instrument was then slowly withdrawn as the colon was fully examined. Estimated blood loss is zero unless otherwise noted in this procedure report.  COLON FINDINGS: A sessile polyp measuring 5 mm in size was found in the transverse colon.  A polypectomy was performed with a cold snare.  The resection was complete, the polyp tissue was completely retrieved and sent to histology.   Five sessile polyps, hyperplastic in appearance, ranging between 3-43mm in size were found in the rectosigmoid colon and rectum.  Polypectomies were performed with a cold snare.  The resection was complete, the polyp tissue was completely retrieved and sent to histology.   Retroflexed views revealed external hemorrhoids. The time to cecum = 2.7 Withdrawal time = 13.9   The scope was withdrawn and the procedure completed. COMPLICATIONS: There were no immediate complications.  ENDOSCOPIC IMPRESSION: 1.   Sessile polyp was found in the transverse colon; polypectomy was performed with a cold snare 2.   Five sessile polyps ranging between 3-66mm in size were found in the rectosigmoid colon and rectum; polypectomies were performed with a cold snare  RECOMMENDATIONS: 1.  Await pathology results 2.  Timing of repeat colonoscopy will be determined by pathology findings. 3.  You will receive a letter within 1-2 weeks with the results of your biopsy as well as final recommendations.  Please call my office if you have not received a letter after 3 weeks.  eSigned:  Jerene Bears, MD 03/13/2015 2:03 PM cc: Kevan Ny, MD and The Patient     PATIENT NAME:  Christopher, Wilkins MR#: [id number]

## 2015-03-13 NOTE — Patient Instructions (Signed)
YOU HAD AN ENDOSCOPIC PROCEDURE TODAY AT [facility]:   Refer to the procedure report that was given to you for any specific questions about what was found during the examination.  If the procedure report does not answer your questions, please call your gastroenterologist to clarify.  If you requested that your care partner not be given the details of your procedure findings, then the procedure report has been included in a sealed envelope for you to review at your convenience later.  YOU SHOULD EXPECT: Some feelings of bloating in the abdomen. Passage of more gas than usual.  Walking can help get rid of the air that was put into your GI tract during the procedure and reduce the bloating. If you had a lower endoscopy (such as a colonoscopy or flexible sigmoidoscopy) you may notice spotting of blood in your stool or on the toilet paper. If you underwent a bowel prep for your procedure, you may not have a normal bowel movement for a few days.  Please Note:  You might notice some irritation and congestion in your nose or some drainage.  This is from the oxygen used during your procedure.  There is no need for concern and it should clear up in a day or so.  SYMPTOMS TO REPORT IMMEDIATELY:   Following lower endoscopy (colonoscopy or flexible sigmoidoscopy):  Excessive amounts of blood in the stool  Significant tenderness or worsening of abdominal pains  Swelling of the abdomen that is new, acute  Fever of 100F or higher   For urgent or emergent issues, a gastroenterologist can be reached at any hour by calling [phone].   DIET: Your first meal following the procedure should be a small meal and then it is ok to progress to your normal diet. Heavy or fried foods are harder to digest and may make you feel nauseous or bloated.  Likewise, meals heavy in dairy and vegetables can increase bloating.  Drink plenty of fluids but you should avoid alcoholic beverages for 24  hours.  ACTIVITY:  You should plan to take it easy for the rest of today and you should NOT DRIVE or use heavy machinery until tomorrow (because of the sedation medicines used during the test).    FOLLOW UP: Our staff will call the number listed on your records the next business day following your procedure to check on you and address any questions or concerns that you may have regarding the information given to you following your procedure. If we do not reach you, we will leave a message.  However, if you are feeling well and you are not experiencing any problems, there is no need to return our call.  We will assume that you have returned to your regular daily activities without incident.  If any biopsies were taken you will be contacted by phone or by letter within the next 1-3 weeks.  Please call us at [phone] if you have not heard about the biopsies in 3 weeks.    SIGNATURES/CONFIDENTIALITY: You and/or your care partner have signed paperwork which will be entered into your electronic medical record.  These signatures attest to the fact that that the information above on your After Visit Summary has been reviewed and is understood.  Full responsibility of the confidentiality of this discharge information lies with you and/or your care-partner.  Discharge instructions given Polyp/Hemorrhoids handout given   You will receive a letter within 1-2 weeks with pathology results. Please call office if you  have not received a letter in 3 weeks

## 2015-03-13 NOTE — Progress Notes (Signed)
Called to room to assist during endoscopic procedure.  Patient ID and intended procedure confirmed with present staff. Received instructions for my participation in the procedure from the performing physician.  

## 2015-03-14 ENCOUNTER — Telehealth: Payer: Self-pay | Admitting: *Deleted

## 2015-03-14 NOTE — Telephone Encounter (Signed)
  Follow up Call-  Call back number 03/13/2015  Post procedure Call Back phone  # [phone]  Permission to leave phone message Yes     Patient questions:  Do you have a fever, pain , or abdominal swelling? No. Pain Score  0 *  Have you tolerated food without any problems? Yes.    Have you been able to return to your normal activities? Yes.    Do you have any questions about your discharge instructions: Diet   No. Medications  No. Follow up visit  No.  Do you have questions or concerns about your Care? No.  Actions: * If pain score is 4 or above: No action needed, pain <4.

## 2015-03-24 ENCOUNTER — Encounter: Payer: Self-pay | Admitting: Internal Medicine

## 2017-08-02 LAB — GLUCOSE, POCT (MANUAL RESULT ENTRY): POC Glucose: 87 mg/dl (ref 70–99)

## 2018-04-06 ENCOUNTER — Other Ambulatory Visit: Payer: Self-pay

## 2018-04-07 ENCOUNTER — Encounter (HOSPITAL_COMMUNITY): Payer: Self-pay | Admitting: Emergency Medicine

## 2018-04-07 ENCOUNTER — Ambulatory Visit (HOSPITAL_COMMUNITY): Payer: BLUE CROSS/BLUE SHIELD

## 2018-04-07 ENCOUNTER — Emergency Department (HOSPITAL_COMMUNITY): Payer: BLUE CROSS/BLUE SHIELD

## 2018-04-07 ENCOUNTER — Emergency Department (HOSPITAL_COMMUNITY)
Admission: EM | Admit: 2018-04-07 | Discharge: 2018-04-07 | Disposition: A | Discharge status: Home / Self Care | Payer: BLUE CROSS/BLUE SHIELD | Attending: Emergency Medicine | Admitting: Emergency Medicine

## 2018-04-07 DIAGNOSIS — R1013 Epigastric pain: Secondary | ICD-10-CM | POA: Diagnosis not present

## 2018-04-07 DIAGNOSIS — Z79899 Other long term (current) drug therapy: Secondary | ICD-10-CM | POA: Diagnosis not present

## 2018-04-07 DIAGNOSIS — K219 Gastro-esophageal reflux disease without esophagitis: Secondary | ICD-10-CM | POA: Insufficient documentation

## 2018-04-07 DIAGNOSIS — R072 Precordial pain: Secondary | ICD-10-CM

## 2018-04-07 DIAGNOSIS — R079 Chest pain, unspecified: Secondary | ICD-10-CM | POA: Diagnosis present

## 2018-04-07 DIAGNOSIS — I10 Essential (primary) hypertension: Secondary | ICD-10-CM | POA: Insufficient documentation

## 2018-04-07 DIAGNOSIS — F17210 Nicotine dependence, cigarettes, uncomplicated: Secondary | ICD-10-CM | POA: Insufficient documentation

## 2018-04-07 DIAGNOSIS — R10816 Epigastric abdominal tenderness: Secondary | ICD-10-CM

## 2018-04-07 DIAGNOSIS — Z720 Tobacco use: Secondary | ICD-10-CM

## 2018-04-07 LAB — I-STAT TROPONIN, ED
TROPONIN I, POC: 0 ng/mL (ref 0.00–0.08)
TROPONIN I, POC: 0 ng/mL (ref 0.00–0.08)

## 2018-04-07 LAB — CBC
HCT: 46.7 % (ref 39.0–52.0)
Hemoglobin: 14.9 g/dL (ref 13.0–17.0)
MCH: 27.2 pg (ref 26.0–34.0)
MCHC: 31.9 g/dL (ref 30.0–36.0)
MCV: 85.2 fL (ref 78.0–100.0)
Platelets: 253 10*3/uL (ref 150–400)
RBC: 5.48 MIL/uL (ref 4.22–5.81)
RDW: 15.1 % (ref 11.5–15.5)
WBC: 4.7 10*3/uL (ref 4.0–10.5)

## 2018-04-07 LAB — BASIC METABOLIC PANEL
ANION GAP: 9 (ref 5–15)
BUN: 24 mg/dL — ABNORMAL HIGH (ref 8–23)
CALCIUM: 8.8 mg/dL — AB (ref 8.9–10.3)
CO2: 22 mmol/L (ref 22–32)
Chloride: 109 mmol/L (ref 98–111)
Creatinine, Ser: 1.23 mg/dL (ref 0.61–1.24)
Glucose, Bld: 95 mg/dL (ref 70–99)
Potassium: 3.9 mmol/L (ref 3.5–5.1)
SODIUM: 140 mmol/L (ref 135–145)

## 2018-04-07 LAB — HEPATIC FUNCTION PANEL
ALBUMIN: 3.4 g/dL — AB (ref 3.5–5.0)
ALT: 24 U/L (ref 0–44)
AST: 22 U/L (ref 15–41)
Alkaline Phosphatase: 69 U/L (ref 38–126)
Bilirubin, Direct: <0.1 mg/dL (ref 0.0–0.2)
Total Bilirubin: 0.3 mg/dL (ref 0.3–1.2)
Total Protein: 6.7 g/dL (ref 6.5–8.1)

## 2018-04-07 LAB — LIPASE, BLOOD: LIPASE: 43 U/L (ref 11–51)

## 2018-04-07 MED ORDER — RANITIDINE HCL 150 MG PO TABS: 150.0000 mg | ORAL_TABLET | Freq: Two times a day (BID) | ORAL | 0 refills | Status: DC

## 2018-04-07 MED ORDER — ASPIRIN 81 MG PO CHEW: 324.0000 mg | CHEWABLE_TABLET | Freq: Once | ORAL | Status: DC

## 2018-04-07 MED ORDER — FAMOTIDINE IN NACL 20-0.9 MG/50ML-% IV SOLN
20.0000 mg | Freq: Once | INTRAVENOUS | Status: AC
  Administered 2018-04-07: 20 mg via INTRAVENOUS
  Filled 2018-04-07: qty 50

## 2018-04-07 MED ORDER — GI COCKTAIL ~~LOC~~
30.0000 mL | Freq: Once | ORAL | Status: AC
  Administered 2018-04-07: 30 mL via ORAL
  Filled 2018-04-07: qty 30

## 2018-04-07 NOTE — ED Provider Notes (Signed)
[facility] EMERGENCY DEPARTMENT Provider Note   CSN: [id number] Arrival date & time: 04/07/18  1037     History   Chief Complaint Chief Complaint  Patient presents with  . Chest Pain    HPI Christopher Wilkins is a 62 y.o. male with a PMHx of HTN and PSHx of cholecystectomy, who presents to the ED with complaints of sudden onset left-sided chest pain that began around 8-9 AM while he was reaching up to turn off a light in the caf at work.  He describes the pain as 10/10 constant sharp left-sided chest pain that radiated somewhat into the left shoulder blade area, worse with movement, coughing, and breathing, and unrelieved with 325 mg aspirin.  He reports associated paresthesias in the left hand that quickly resolved, diaphoresis, lightheadedness, and a dry cough for a few days.  He admits to being a cigarette smoker.  No known family history of cardiac disease.  He has a niece who had a DVT remotely, but no other known family history of DVT/PE.  He denies fevers, chills, SOB, LE swelling, recent travel/surgery/immobilization, personal hx of DVT/PE, abd pain, N/V/D/C, hematuria, dysuria, myalgias, arthralgias, claudication, orthopnea, focal weakness, or any other complaints at this time.   The history is provided by the patient and medical records. No language interpreter was used.  Chest Pain   Associated symptoms include cough, diaphoresis and numbness (paresthesias to L hand- resolved). Pertinent negatives include no abdominal pain, no fever, no nausea, no shortness of breath, no vomiting and no weakness.    Past Medical History:  Diagnosis Date  . Hypertension     There are no active problems to display for this patient.   Past Surgical History:  Procedure Laterality Date  . CHOLECYSTECTOMY    . OTHER SURGICAL HISTORY     "Shot a couple of times"        Home Medications    Prior to Admission medications   Medication Sig Start Date End Date Taking?  Authorizing Provider  Cholecalciferol (VITAMIN D PO) Take by mouth.    [provider]  lisinopril-hydrochlorothiazide (PRINZIDE) 20-12.5 MG per tablet Take 1 tablet by mouth daily. 09/07/12   Louretta Shorten, MD  PRESCRIPTION MEDICATION Prostate med    [provider]    Family History Family History  Problem Relation Age of Onset  . Colon cancer Neg Hx     Social History Social History   Tobacco Use  . Smoking status: Current Every Day Smoker    Packs/day: 0.25    Types: Cigarettes  . Smokeless tobacco: Never Used  Substance Use Topics  . Alcohol use: Yes    Alcohol/week: 0.0 oz    Comment: occasionally; once every 2 weeks  . Drug use: No     Allergies   Codeine   Review of Systems Review of Systems  Constitutional: Positive for diaphoresis. Negative for chills and fever.  Respiratory: Positive for cough. Negative for shortness of breath.   Cardiovascular: Positive for chest pain. Negative for leg swelling.  Gastrointestinal: Negative for abdominal pain, constipation, diarrhea, nausea and vomiting.  Genitourinary: Negative for dysuria and hematuria.  Musculoskeletal: Negative for arthralgias and myalgias.  Skin: Negative for color change.  Allergic/Immunologic: Negative for immunocompromised state.  Neurological: Positive for light-headedness and numbness (paresthesias to L hand- resolved). Negative for weakness.  Psychiatric/Behavioral: Negative for confusion.   All other systems reviewed and are negative for acute change except as noted in the HPI.  Physical Exam Updated Vital Signs BP 127/89   Pulse 84   Temp 97.6 F (36.4 C) (Oral)   Resp 18   SpO2 98%   Physical Exam  Constitutional: He is oriented to person, place, and time. Vital signs are normal. He appears well-developed and well-nourished.  Non-toxic appearance. No distress.  Afebrile, nontoxic, NAD  HENT:  Head: Normocephalic and atraumatic.  Mouth/Throat: Oropharynx is  clear and moist and mucous membranes are normal.  Eyes: Conjunctivae and EOM are normal. Right eye exhibits no discharge. Left eye exhibits no discharge.  Neck: Normal range of motion. Neck supple.  Cardiovascular: Normal rate, regular rhythm, normal heart sounds and intact distal pulses. Exam reveals no gallop and no friction rub.  No murmur heard. RRR, nl s1/s2, no m/r/g, distal pulses intact, no pedal edema   Pulmonary/Chest: Effort normal and breath sounds normal. No respiratory distress. He has no decreased breath sounds. He has no wheezes. He has no rhonchi. He has no rales. He exhibits tenderness. He exhibits no crepitus, no deformity and no retraction.  CTAB in all lung fields, no w/r/r, no hypoxia or increased WOB, speaking in full sentences, SpO2 98% on RA Chest wall with moderate TTP to L anterior chest and somewhat into the epigastric region, without crepitus, deformities, or retractions.     Abdominal: Soft. Normal appearance and bowel sounds are normal. He exhibits no distension. There is tenderness in the right upper quadrant, epigastric area and left upper quadrant. There is no rigidity, no rebound, no guarding, no CVA tenderness, no tenderness at McBurney's point and negative Murphy's sign.  Soft, nondistended, +BS throughout, with moderate epigastric/upper abd TTP, no r/g/r, neg murphy's as he can fully inspire but this does elicit pain, neg mcburney's, no CVA TTP   Musculoskeletal: Normal range of motion.  MAE x4 Strength and sensation grossly intact in all extremities Distal pulses intact Gait steady No pedal edema, neg homan's bilaterally  Mild TTP to L scapular area/musculature around the scapula, no focal bony or joint line TTP in L shoulder.   Neurological: He is alert and oriented to person, place, and time. He has normal strength. No sensory deficit.  Skin: Skin is warm, dry and intact. No rash noted.  Psychiatric: He has a normal mood and affect.  Nursing note and  vitals reviewed.    ED Treatments / Results  Labs (all labs ordered are listed, but only abnormal results are displayed) Labs Reviewed  BASIC METABOLIC PANEL - Abnormal; Notable for the following components:      Result Value   BUN 24 (*)    Calcium 8.8 (*)    All other components within normal limits  HEPATIC FUNCTION PANEL - Abnormal; Notable for the following components:   Albumin 3.4 (*)    All other components within normal limits  CBC  LIPASE, BLOOD  I-STAT TROPONIN, ED  I-STAT TROPONIN, ED    EKG EKG Interpretation  Date/Time:  Thursday April 06 2018 10:42:45 EDT Ventricular Rate:  79 PR Interval:  162 QRS Duration: 86 QT Interval:  404 QTC Calculation: 463 R Axis:   38 Text Interpretation:  Normal sinus rhythm Possible Left atrial enlargement Anterior infarct , age undetermined Abnormal ECG No significant change since last tracing Confirmed by Isla Pence [phone]) on 04/07/2018 12:57:22 PM   Radiology Dg Chest 2 View  Result Date: 04/07/2018 CLINICAL DATA:  Chest pain. EXAM: CHEST - 2 VIEW COMPARISON:  07/25/2014. FINDINGS: Cardiomegaly with normal pulmonary vascularity. Mild low lung  volumes with mild bibasilar subsegmental atelectasis. No pleural effusion pneumothorax. Stable to slightly smaller faint sclerotic density is noted the left anterior fifth rib. This may be a site of prior injury or bone island no acute bony abnormality identified. IMPRESSION: 1.  Cardiomegaly.  No pulmonary venous congestion. 2.  Low lung volumes with mild bibasilar atelectasis. Electronically Signed   By: Marcello Moores  Register   On: 04/07/2018 11:06   US Abdomen Complete  Result Date: 04/07/2018 CLINICAL DATA:  Chest and upper abdominal pain. EXAM: ABDOMEN ULTRASOUND COMPLETE COMPARISON:  None. FINDINGS: Gallbladder: Surgically absent. Common bile duct: Diameter: 4.1 mm without evidence of choledocholithiasis. Liver: No focal lesion identified. Mild increase in parenchymal echogenicity.  Portal vein is patent on color Doppler imaging with normal direction of blood flow towards the liver. IVC: No abnormality visualized. Pancreas: Suboptimally visualized due to overlying bowel gas. Spleen: Size and appearance within normal limits. Right Kidney: Length: 11.4 cm. Echogenicity within normal limits. No mass or hydronephrosis visualized. Left Kidney: Length: 12.8 cm. Echogenicity within normal limits. No mass or hydronephrosis visualized. Abdominal aorta: No aneurysm visualized. The bifurcation and midportion of the aorta were obscured secondary to overlying bowel. Other findings: None. IMPRESSION: 1. Status post cholecystectomy without evidence of choledocholithiasis. 2. Suboptimal assessment of pancreas, mid aorta and aortic bifurcation due to overlying bowel gas artifacts. 3. The remainder of the study is unremarkable. Electronically Signed   By: Ashley Royalty M.D.   On: 04/07/2018 18:04    Procedures Procedures (including critical care time)  Medications Ordered in ED Medications  famotidine (PEPCID) IVPB 20 mg premix (0 mg Intravenous Stopped 04/07/18 1358)  gi cocktail (Maalox,Lidocaine,Donnatal) (30 mLs Oral Given 04/07/18 1328)     Initial Impression / Assessment and Plan / ED Course  I have reviewed the triage vital signs and the nursing notes.  Pertinent labs & imaging results that were available during my care of the patient were reviewed by me and considered in my medical decision making (see chart for details).     62 y.o. male here with CP that began after he reached for something. On exam, reproducible tenderness to the epigastric/upper abd areas and L anterior chest, nonperitoneal abdomen, neg murphy's as he can fully inspire although this does elicit pain; no pedal edema, neg homan's bilaterally, no tachycardia or hypoxia. Clear lungs. Work up thus far reveals: trop neg; BMP with marginally elevated BUN 24 but otherwise unremarkable; CBC WNL; CXR with mild cardiomegaly  without pulm vasc congestion, mild bibasilar atelectasis; EKG nonischemic and without acute changes. Will check lipase and LFTs, get abd U/S, give pepcid/GI cocktail, and recheck second trop at 3hr mark (2pm). Doubt PE or dissection, doubt need for d-dimer at this time. Will reassess shortly.   6:44 PM LFTs WNL. Lipase WNL. Second trop negative. Abd U/S unremarkable without evidence of choledocholithiasis or other acute findings, bowel gas artifacts obscures optimal assessment of pancreas/mid aortic/aortic bifurcation but overall no acute findings noted. Pt feeling much better, pain resolved. Overall, symptoms most c/w musculoskeletal CP vs GI related symptoms (GERD/gastritis/PUD/etc); pt with low HEART score, two negative troponins, reassuring EKG and work up. Doubt need for further emergent work up at this time. Will start on zantac. Advised OTC remedies for symptomatic relief and close PCP f/up for recheck and ongoing management.  Diet/lifestyle modifications advised for possible gastritis/GERD. Tobacco cessation strongly encouraged. I explained the diagnosis and have given explicit precautions to return to the ER including for any other new or worsening symptoms.  The patient understands and accepts the medical plan as it's been dictated and I have answered their questions. Discharge instructions concerning home care and prescriptions have been given. The patient is STABLE and is discharged to home in good condition.    Final Clinical Impressions(s) / ED Diagnoses   Final diagnoses:  Precordial chest pain  Epigastric abdominal tenderness without rebound tenderness  Tobacco user  Gastroesophageal reflux disease, esophagitis presence not specified    ED Discharge Orders        Ordered    ranitidine (ZANTAC) 150 MG tablet  2 times daily     04/07/18 [address] Danayah Smyre, [place], [place] 04/07/18 1845    Isla Pence, MD 04/07/18 Einar Crow

## 2018-04-07 NOTE — Discharge Instructions (Addendum)
Your labs, xray, and EKG are all reassuring, your chest pain could be a variety of things including gas pain, muscle pain, indigestion, and other nonemergent issues. Alternate between tylenol and motrin as directed as needed for pain, always taking these on a full stomach and NEVER on an empty stomach. Stay well hydrated. You may consider using heat to the areas of pain, no more than 20 minutes every hour. Start taking zantac as directed to help with any indigestion/reflux. You may consider using over the counter tums, maalox, pepto bismol, etc to help with symptoms; avoid spicy/fatty/fried/acidic foods, avoid drinking alcohol or coffee/tea/soda. STOP SMOKING! Follow up with your regular doctor in 1 week for recheck of symptoms. Return to the ER for changes or worsening symptoms.  SEEK IMMEDIATE MEDICAL ATTENTION IF: You develop a fever.  Your chest pains become severe or intolerable.  You develop new, unexplained symptoms (problems).  You develop shortness of breath, nausea, vomiting, sweating or feel light headed.  You develop a new cough or you cough up blood. You develop new leg swelling

## 2018-04-07 NOTE — ED Triage Notes (Signed)
Pt arrives via [employer] from work, states he was reaching for something when he began having sharp chest pain that decreases when hes at rest, pain increases with inspiration. 12 lead unremarkable, vss. Nad.

## 2018-04-07 NOTE — ED Notes (Signed)
Pt has had 324 of ASP today

## 2018-04-07 NOTE — ED Notes (Signed)
Patient transported to Ultrasound 

## 2019-04-19 ENCOUNTER — Encounter (HOSPITAL_COMMUNITY): Payer: Self-pay | Admitting: Emergency Medicine

## 2019-04-19 ENCOUNTER — Inpatient Hospital Stay (HOSPITAL_COMMUNITY)
Admission: EM | Admit: 2019-04-19 | Discharge: 2019-04-20 | DRG: 916 | Disposition: A | Discharge status: Home / Self Care | Payer: BLUE CROSS/BLUE SHIELD | Attending: Pulmonary Disease | Admitting: Pulmonary Disease

## 2019-04-19 ENCOUNTER — Other Ambulatory Visit: Payer: Self-pay

## 2019-04-19 DIAGNOSIS — I10 Essential (primary) hypertension: Secondary | ICD-10-CM | POA: Diagnosis present

## 2019-04-19 DIAGNOSIS — N179 Acute kidney failure, unspecified: Secondary | ICD-10-CM | POA: Diagnosis present

## 2019-04-19 DIAGNOSIS — Z1159 Encounter for screening for other viral diseases: Secondary | ICD-10-CM

## 2019-04-19 DIAGNOSIS — Z79899 Other long term (current) drug therapy: Secondary | ICD-10-CM

## 2019-04-19 DIAGNOSIS — T783XXD Angioneurotic edema, subsequent encounter: Secondary | ICD-10-CM

## 2019-04-19 DIAGNOSIS — T783XXA Angioneurotic edema, initial encounter: Principal | ICD-10-CM | POA: Diagnosis present

## 2019-04-19 DIAGNOSIS — F17210 Nicotine dependence, cigarettes, uncomplicated: Secondary | ICD-10-CM | POA: Diagnosis present

## 2019-04-19 LAB — COMPREHENSIVE METABOLIC PANEL
ALT: 15 U/L (ref 0–44)
AST: 23 U/L (ref 15–41)
Albumin: 3.1 g/dL — ABNORMAL LOW (ref 3.5–5.0)
Alkaline Phosphatase: 56 U/L (ref 38–126)
Anion gap: 12 (ref 5–15)
BUN: 20 mg/dL (ref 8–23)
CO2: 23 mmol/L (ref 22–32)
Calcium: 8.7 mg/dL — ABNORMAL LOW (ref 8.9–10.3)
Chloride: 104 mmol/L (ref 98–111)
Creatinine, Ser: 1.26 mg/dL — ABNORMAL HIGH (ref 0.61–1.24)
GFR calc Af Amer: >60 mL/min (ref >60)
GFR calc non Af Amer: >60 mL/min (ref >60)
Glucose, Bld: 90 mg/dL (ref 70–99)
Potassium: 4.2 mmol/L (ref 3.5–5.1)
Sodium: 139 mmol/L (ref 135–145)
Total Bilirubin: 1.1 mg/dL (ref 0.3–1.2)
Total Protein: 6.4 g/dL — ABNORMAL LOW (ref 6.5–8.1)

## 2019-04-19 LAB — CBC
HCT: 46.4 % (ref 39.0–52.0)
Hemoglobin: 15.4 g/dL (ref 13.0–17.0)
MCH: 28.7 pg (ref 26.0–34.0)
MCHC: 33.2 g/dL (ref 30.0–36.0)
MCV: 86.6 fL (ref 80.0–100.0)
Platelets: 249 10*3/uL (ref 150–400)
RBC: 5.36 MIL/uL (ref 4.22–5.81)
RDW: 16.2 % — ABNORMAL HIGH (ref 11.5–15.5)
WBC: 9.2 10*3/uL (ref 4.0–10.5)
nRBC: 0 % (ref 0.0–0.2)

## 2019-04-19 LAB — CBC WITH DIFFERENTIAL/PLATELET
Abs Immature Granulocytes: 0.02 10*3/uL (ref 0.00–0.07)
Basophils Absolute: 0.1 10*3/uL (ref 0.0–0.1)
Basophils Relative: 1 %
Eosinophils Absolute: 0.1 10*3/uL (ref 0.0–0.5)
Eosinophils Relative: 2 %
HCT: 46 % (ref 39.0–52.0)
Hemoglobin: 15.1 g/dL (ref 13.0–17.0)
Immature Granulocytes: 0 %
Lymphocytes Relative: 46 %
Lymphs Abs: 2.9 10*3/uL (ref 0.7–4.0)
MCH: 28.5 pg (ref 26.0–34.0)
MCHC: 32.8 g/dL (ref 30.0–36.0)
MCV: 87 fL (ref 80.0–100.0)
Monocytes Absolute: 0.6 10*3/uL (ref 0.1–1.0)
Monocytes Relative: 10 %
Neutro Abs: 2.6 10*3/uL (ref 1.7–7.7)
Neutrophils Relative %: 41 %
Platelets: 227 10*3/uL (ref 150–400)
RBC: 5.29 MIL/uL (ref 4.22–5.81)
RDW: 15.9 % — ABNORMAL HIGH (ref 11.5–15.5)
WBC: 6.2 10*3/uL (ref 4.0–10.5)
nRBC: 0 % (ref 0.0–0.2)

## 2019-04-19 LAB — MAGNESIUM: Magnesium: 2 mg/dL (ref 1.7–2.4)

## 2019-04-19 LAB — MRSA PCR SCREENING: MRSA by PCR: NEGATIVE

## 2019-04-19 LAB — SARS CORONAVIRUS 2 BY RT PCR (HOSPITAL ORDER, PERFORMED IN ~~LOC~~ HOSPITAL LAB): SARS Coronavirus 2: NEGATIVE

## 2019-04-19 LAB — BASIC METABOLIC PANEL
Anion gap: 10 (ref 5–15)
BUN: 18 mg/dL (ref 8–23)
CO2: 23 mmol/L (ref 22–32)
Calcium: 8.4 mg/dL — ABNORMAL LOW (ref 8.9–10.3)
Chloride: 106 mmol/L (ref 98–111)
Creatinine, Ser: 1.11 mg/dL (ref 0.61–1.24)
GFR calc Af Amer: >60 mL/min (ref >60)
GFR calc non Af Amer: >60 mL/min (ref >60)
Glucose, Bld: 98 mg/dL (ref 70–99)
Potassium: 4 mmol/L (ref 3.5–5.1)
Sodium: 139 mmol/L (ref 135–145)

## 2019-04-19 LAB — HIV ANTIBODY (ROUTINE TESTING W REFLEX): HIV Screen 4th Generation wRfx: NONREACTIVE

## 2019-04-19 LAB — PHOSPHORUS: Phosphorus: 3.4 mg/dL (ref 2.5–4.6)

## 2019-04-19 LAB — C-REACTIVE PROTEIN: CRP: <0.8 mg/dL (ref <1.0)

## 2019-04-19 LAB — SEDIMENTATION RATE: Sed Rate: 1 mm/hr (ref 0–16)

## 2019-04-19 MED ORDER — EPINEPHRINE 0.3 MG/0.3ML IJ SOAJ
0.3000 mg | Freq: Once | INTRAMUSCULAR | Status: AC
  Administered 2019-04-19: 0.3 mg via INTRAMUSCULAR
  Filled 2019-04-19: qty 0.3

## 2019-04-19 MED ORDER — FAMOTIDINE IN NACL 20-0.9 MG/50ML-% IV SOLN
20.0000 mg | Freq: Two times a day (BID) | INTRAVENOUS | Status: DC
  Administered 2019-04-19 – 2019-04-20 (×4): 20 mg via INTRAVENOUS
  Filled 2019-04-19 (×4): qty 50

## 2019-04-19 MED ORDER — ONDANSETRON HCL 4 MG/2ML IJ SOLN
4.0000 mg | Freq: Once | INTRAMUSCULAR | Status: AC
  Administered 2019-04-19: 4 mg via INTRAVENOUS
  Filled 2019-04-19: qty 2

## 2019-04-19 MED ORDER — LIDOCAINE HCL (PF) 4 % IJ SOLN
5.0000 mL | Freq: Once | INTRAMUSCULAR | Status: AC
  Administered 2019-04-19: 5 mL
  Filled 2019-04-19: qty 5

## 2019-04-19 MED ORDER — HYDROCHLOROTHIAZIDE 12.5 MG PO CAPS
12.5000 mg | ORAL_CAPSULE | Freq: Every day | ORAL | Status: DC
  Administered 2019-04-19 – 2019-04-20 (×2): 12.5 mg via ORAL
  Filled 2019-04-19 (×2): qty 1

## 2019-04-19 MED ORDER — METHYLPREDNISOLONE SODIUM SUCC 125 MG IJ SOLR
60.0000 mg | Freq: Three times a day (TID) | INTRAMUSCULAR | Status: DC
  Administered 2019-04-19 – 2019-04-20 (×4): 60 mg via INTRAVENOUS
  Filled 2019-04-19 (×4): qty 2

## 2019-04-19 MED ORDER — LACTATED RINGERS IV SOLN
INTRAVENOUS | Status: DC
  Administered 2019-04-19 (×2): via INTRAVENOUS

## 2019-04-19 MED ORDER — DIPHENHYDRAMINE HCL 50 MG/ML IJ SOLN
25.0000 mg | Freq: Once | INTRAMUSCULAR | Status: AC
  Administered 2019-04-19: 25 mg via INTRAVENOUS
  Filled 2019-04-19: qty 1

## 2019-04-19 MED ORDER — DEXAMETHASONE SODIUM PHOSPHATE 10 MG/ML IJ SOLN
10.0000 mg | Freq: Once | INTRAMUSCULAR | Status: AC
  Administered 2019-04-19: 10 mg via INTRAVENOUS
  Filled 2019-04-19: qty 1

## 2019-04-19 MED ORDER — AMLODIPINE BESYLATE 5 MG PO TABS
5.0000 mg | ORAL_TABLET | Freq: Every day | ORAL | Status: DC
  Administered 2019-04-19 – 2019-04-20 (×2): 5 mg via ORAL
  Filled 2019-04-19 (×2): qty 1

## 2019-04-19 MED ORDER — SODIUM CHLORIDE 0.9 % IV BOLUS
500.0000 mL | Freq: Once | INTRAVENOUS | Status: AC
  Administered 2019-04-19: 500 mL via INTRAVENOUS

## 2019-04-19 MED ORDER — SODIUM CHLORIDE 0.9% IV SOLUTION: Freq: Once | INTRAVENOUS | Status: DC

## 2019-04-19 MED ORDER — DIPHENHYDRAMINE HCL 50 MG/ML IJ SOLN
25.0000 mg | Freq: Four times a day (QID) | INTRAMUSCULAR | Status: DC
  Administered 2019-04-19 – 2019-04-20 (×6): 25 mg via INTRAVENOUS
  Filled 2019-04-19 (×6): qty 1

## 2019-04-19 MED ORDER — HEPARIN SODIUM (PORCINE) 5000 UNIT/ML IJ SOLN
5000.0000 [IU] | Freq: Three times a day (TID) | INTRAMUSCULAR | Status: DC
  Administered 2019-04-19 – 2019-04-20 (×4): 5000 [IU] via SUBCUTANEOUS
  Filled 2019-04-19 (×4): qty 1

## 2019-04-19 MED ORDER — CHLORHEXIDINE GLUCONATE CLOTH 2 % EX PADS
6.0000 | MEDICATED_PAD | Freq: Every day | CUTANEOUS | Status: DC
  Administered 2019-04-19: 6 via TOPICAL

## 2019-04-19 NOTE — Progress Notes (Signed)
NEW ADMISSION NOTE New Admission Note:   Arrival Method: from [place] via wheelchair Mental Orientation: Alert and oriented x4 Telemetry: 84m07 Assessment: Completed Skin: intact IV: L FA NSL Pain: 0-10 Tubes: none  Safety Measures: Safety Fall Prevention Plan has been given, discussed and signed [facility] [place] Orientation: Patient has been orientated to the room, unit and staff.   Orders have been reviewed and implemented. Will continue to monitor the patient. Call light has been placed within reach.  Orville Govern, RN

## 2019-04-19 NOTE — ED Notes (Signed)
The pt feels like there is somehting in his throat since 1700 today  He was eating fish earlier today

## 2019-04-19 NOTE — ED Notes (Signed)
Rep;ort given to rn on 2h 

## 2019-04-19 NOTE — ED Notes (Signed)
ent still has not arrived

## 2019-04-19 NOTE — Consult Note (Signed)
Reason for Consult:airway swelling  Referring Physician: er  Christopher Wilkins is an 63 y.o. male.  HPI: hx of swelling of throat for about 10 hours. He has a hot potato voice but no stridor or breathing trouble. He has not had this previously. He not sure if he is allergic to anything new. He takes ACE inh.   Past Medical History:  Diagnosis Date  . Hypertension     Past Surgical History:  Procedure Laterality Date  . CHOLECYSTECTOMY    . OTHER SURGICAL HISTORY     "Shot a couple of times"    Family History  Problem Relation Age of Onset  . Colon cancer Neg Hx     Social History:  reports that he has been smoking cigarettes. He has been smoking about 0.25 packs per day. He has never used smokeless tobacco. He reports current alcohol use. He reports that he does not use drugs.  Allergies:  Allergies  Allergen Reactions  . Codeine Nausea And Vomiting    Medications: I have reviewed the patient's current medications.  Results for orders placed or performed during the hospital encounter of 04/19/19 (from the past 48 hour(s))  Comprehensive metabolic panel     Status: Abnormal   Collection Time: 04/19/19  1:07 AM  Result Value Ref Range   Sodium 139 135 - 145 mmol/L   Potassium 4.2 3.5 - 5.1 mmol/L   Chloride 104 98 - 111 mmol/L   CO2 23 22 - 32 mmol/L   Glucose, Bld 90 70 - 99 mg/dL   BUN 20 8 - 23 mg/dL   Creatinine, Ser 1.26 (H) 0.61 - 1.24 mg/dL   Calcium 8.7 (L) 8.9 - 10.3 mg/dL   Total Protein 6.4 (L) 6.5 - 8.1 g/dL   Albumin 3.1 (L) 3.5 - 5.0 g/dL   AST 23 15 - 41 U/L   ALT 15 0 - 44 U/L   Alkaline Phosphatase 56 38 - 126 U/L   Total Bilirubin 1.1 0.3 - 1.2 mg/dL   GFR calc non Af Amer >60 >60 mL/min   GFR calc Af Amer >60 >60 mL/min   Anion gap 12 5 - 15    Comment: Performed at [facility], [address], [place], [place] [address]  CBC with Differential/Platelet     Status: Abnormal   Collection Time: 04/19/19  2:03 AM  Result Value Ref Range   WBC  6.2 4.0 - 10.5 K/uL   RBC 5.29 4.22 - 5.81 MIL/uL   Hemoglobin 15.1 13.0 - 17.0 g/dL   HCT 46.0 39.0 - 52.0 %   MCV 87.0 80.0 - 100.0 fL   MCH 28.5 26.0 - 34.0 pg   MCHC 32.8 30.0 - 36.0 g/dL   RDW 15.9 (H) 11.5 - 15.5 %   Platelets 227 150 - 400 K/uL   nRBC 0.0 0.0 - 0.2 %   Neutrophils Relative % 41 %   Neutro Abs 2.6 1.7 - 7.7 K/uL   Lymphocytes Relative 46 %   Lymphs Abs 2.9 0.7 - 4.0 K/uL   Monocytes Relative 10 %   Monocytes Absolute 0.6 0.1 - 1.0 K/uL   Eosinophils Relative 2 %   Eosinophils Absolute 0.1 0.0 - 0.5 K/uL   Basophils Relative 1 %   Basophils Absolute 0.1 0.0 - 0.1 K/uL   Immature Granulocytes 0 %   Abs Immature Granulocytes 0.02 0.00 - 0.07 K/uL    Comment: Performed at [facility]  [address], [place], [place] [address]  Prepare fresh frozen plasma     Status: None (Preliminary result)   Collection Time: 04/19/19  2:43 AM  Result Value Ref Range   Unit Number [id number]    Blood Component Type THW PLS APHR    Unit division A0    Status of Unit ALLOCATED    Transfusion Status OK TO TRANSFUSE   CBC     Status: Abnormal   Collection Time: 04/19/19  3:03 AM  Result Value Ref Range   WBC 9.2 4.0 - 10.5 K/uL   RBC 5.36 4.22 - 5.81 MIL/uL   Hemoglobin 15.4 13.0 - 17.0 g/dL   HCT 46.4 39.0 - 52.0 %   MCV 86.6 80.0 - 100.0 fL   MCH 28.7 26.0 - 34.0 pg   MCHC 33.2 30.0 - 36.0 g/dL   RDW 16.2 (H) 11.5 - 15.5 %   Platelets 249 150 - 400 K/uL   nRBC 0.0 0.0 - 0.2 %    Comment: Performed at [facility], [place]. [address] [place], [place] [address]    No results found.  ROS Blood pressure 128/79, pulse 69, temperature 98.9 F (37.2 C), temperature source Oral, resp. rate 15, SpO2 98 %. Physical Exam  Constitutional: He appears well-developed.  HENT:  He is upright and speaking with no distress. Nose without sweliing. FOE- the soft palate is swollen. The epiglottis and larynx looks normal. Vocal cords move normal.  His tongue looks  normal anteriorly. It moves well. 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 He has no epiglolttic or laryngeal swelling. His tongue looks normal he is 10 hours from onset so observation would appear to be appropriate.   [name] 04/19/2019, 3:18 AM

## 2019-04-19 NOTE — ED Notes (Signed)
No change in the pts symptoms  He reports that his throat hurts whenever he tries to talk

## 2019-04-19 NOTE — ED Triage Notes (Signed)
Patient feels like he has something in his throat, has been trying to spit it up since 4-5 hours ago.  Took two benadryl earlier thinking that it might be something he was allergic to.  He states that he feels like it is cutting off his air.

## 2019-04-19 NOTE — ED Provider Notes (Addendum)
[facility] EMERGENCY DEPARTMENT Provider Note   CSN: [id number] Arrival date & time: 04/19/19  0012    History   Chief Complaint Chief Complaint  Patient presents with  . Foreign Body    HPI Christopher Wilkins is a 63 y.o. male.     HPI 63 year old male comes in a chief complaint of difficulty swallowing.  He has history of hypertension.  He reports about 3 or 4 hours ago he just had finished eating chicken and fish -and he suddenly started feeling like he was having something stuck in his throat.  Over time he started having difficulty in swallowing, and now he spitting out slight blood.  Patient has no history of food impaction or esophageal dysfunction.  He also denies any history of strokes or severe allergic reaction.  Patient also denies any new exposures with the food intake.  He denies any trouble breathing, but continues to state that he is having some discomfort with swallowing.  Past Medical History:  Diagnosis Date  . Hypertension     Patient Active Problem List   Diagnosis Date Noted  . Angioedema 04/19/2019    Past Surgical History:  Procedure Laterality Date  . CHOLECYSTECTOMY    . OTHER SURGICAL HISTORY     "Shot a couple of times"        Home Medications    Prior to Admission medications   Medication Sig Start Date End Date Taking? Authorizing Provider  amitriptyline (ELAVIL) 50 MG tablet Take 50 mg by mouth daily as needed. 02/08/18   [provider]  lisinopril-hydrochlorothiazide (PRINZIDE) 20-12.5 MG per tablet Take 1 tablet by mouth daily. 09/07/12   Louretta Shorten, MD  ranitidine (ZANTAC) 150 MG tablet Take 1 tablet (150 mg total) by mouth 2 (two) times daily. 04/07/18   Street, [place], PA-C  tamsulosin (FLOMAX) 0.4 MG CAPS capsule Take 0.4 mg by mouth daily. 02/08/18   [provider]    Family History Family History  Problem Relation Age of Onset  . Colon cancer Neg Hx     Social History Social History    Tobacco Use  . Smoking status: Current Every Day Smoker    Packs/day: 0.25    Types: Cigarettes  . Smokeless tobacco: Never Used  Substance Use Topics  . Alcohol use: Yes    Alcohol/week: 0.0 standard drinks    Comment: occasionally; once every 2 weeks  . Drug use: No     Allergies   Codeine   Review of Systems Review of Systems  Unable to perform ROS: Acuity of condition  Constitutional: Positive for activity change.  HENT: Positive for trouble swallowing and voice change. Negative for drooling.   Respiratory: Negative for shortness of breath and stridor.   Cardiovascular: Negative for chest pain.  Allergic/Immunologic: Negative for immunocompromised state.  Hematological: Does not bruise/bleed easily.     Physical Exam Updated Vital Signs BP 122/69   Pulse 78   Temp 98.9 F (37.2 C) (Oral)   Resp 19   Wt 88.5 kg   SpO2 92%   BMI 31.49 kg/m   Physical Exam Vitals signs and nursing note reviewed.  Constitutional:      Appearance: He is well-developed.  HENT:     Head: Atraumatic.     Mouth/Throat:     Comments: Patient has bullous appearing edematous uvula.  No tongue swelling appreciated. Given the significantly edematous uvula, we are unable to visualize the tonsils or the back of the throat  Neck:     Musculoskeletal: Neck supple.  Cardiovascular:     Rate and Rhythm: Normal rate.  Pulmonary:     Effort: Pulmonary effort is normal.     Breath sounds: No stridor.  Skin:    General: Skin is warm.  Neurological:     Mental Status: He is alert and oriented to person, place, and time.      ED Treatments / Results  Labs (all labs ordered are listed, but only abnormal results are displayed) Labs Reviewed  COMPREHENSIVE METABOLIC PANEL - Abnormal; Notable for the following components:      Result Value   Creatinine, Ser 1.26 (*)    Calcium 8.7 (*)    Total Protein 6.4 (*)    Albumin 3.1 (*)    All other components within normal limits  CBC WITH  DIFFERENTIAL/PLATELET - Abnormal; Notable for the following components:   RDW 15.9 (*)    All other components within normal limits  CBC - Abnormal; Notable for the following components:   RDW 16.2 (*)    All other components within normal limits  BASIC METABOLIC PANEL - Abnormal; Notable for the following components:   Calcium 8.4 (*)    All other components within normal limits  SARS CORONAVIRUS 2 (HOSPITAL ORDER, [facility])  MRSA PCR SCREENING  MAGNESIUM  PHOSPHORUS  SEDIMENTATION RATE  CBC WITH DIFFERENTIAL/PLATELET  HIV ANTIBODY (ROUTINE TESTING W REFLEX)  C4 COMPLEMENT  C3 COMPLEMENT  C-REACTIVE PROTEIN  PREPARE FRESH FROZEN PLASMA    EKG None  Radiology No results found.  Procedures .Critical Care Performed by: Varney Biles, MD Authorized by: Varney Biles, MD   Critical care provider statement:    Critical care time (minutes):  45   Critical care was necessary to treat or prevent imminent or life-threatening deterioration of the following conditions: Angioedema.   Critical care was time spent personally by me on the following activities:  Discussions with consultants, evaluation of patient's response to treatment, examination of patient, ordering and performing treatments and interventions, ordering and review of laboratory studies, ordering and review of radiographic studies, pulse oximetry, re-evaluation of patient's condition, obtaining history from patient or surrogate and review of old charts   (including critical care time)  Medications Ordered in ED Medications  heparin injection 5,000 Units (has no administration in time range)  lactated ringers infusion ( Intravenous New Bag/Given 04/19/19 0429)  0.9 %  sodium chloride infusion (Manually program via Guardrails IV Fluids) ( Intravenous Not Given 04/19/19 0431)  methylPREDNISolone sodium succinate (SOLU-MEDROL) 125 mg/2 mL injection 60 mg (has no administration in time range)   famotidine (PEPCID) IVPB 20 mg premix (20 mg Intravenous New Bag/Given 04/19/19 0443)  diphenhydrAMINE (BENADRYL) injection 25 mg (has no administration in time range)  dexamethasone (DECADRON) injection 10 mg (10 mg Intravenous Given 04/19/19 0129)  diphenhydrAMINE (BENADRYL) injection 25 mg (25 mg Intravenous Given 04/19/19 0127)  ondansetron (ZOFRAN) injection 4 mg (4 mg Intravenous Given 04/19/19 0132)  sodium chloride 0.9 % bolus 500 mL (0 mLs Intravenous Stopped 04/19/19 0242)  EPINEPHrine (EPI-PEN) injection 0.3 mg (0.3 mg Intramuscular Given 04/19/19 0221)  lidocaine (XYLOCAINE) 4 % (PF) injection 5 mL (5 mLs Other Given 04/19/19 0241)     Initial Impression / Assessment and Plan / ED Course  I have reviewed the triage vital signs and the nursing notes.  Pertinent labs & imaging results that were available during my care of the patient were reviewed by me  and considered in my medical decision making (see chart for details).        63 year old comes with a chief complaint of difficulty in swallowing and feeling that something is stuck in his throat.  The symptoms started about the same time he completed having dinner.  He has never had history of severe allergic reaction or angioedema.  He also does not have any history of esophageal dysfunction.  On exam it is clear that he is got uvulitis/severely edematous uvula.  Etiology for this is likely hypersensitivity given the sudden onset of the symptoms.  He has been given Decadron along with Benadryl and on reassessment he still feeling unwell therefore we will give him IM epi. He is on ACE, and his symptoms could be is induced.  Infectious process deemed less likely because the symptoms were sudden onset in nature.  The other possibility is trauma, but patient states that while eating food he did not chew on any bones or swallowed any bones that he is aware of.  Final Clinical Impressions(s) / ED Diagnoses   Final diagnoses:  Angioedema,  initial encounter    ED Discharge Orders    None         Varney Biles, MD 04/19/19 [phone]

## 2019-04-19 NOTE — H&P (Addendum)
NAME:  Christopher Wilkins, MRN:  [id number], DOB:  Jun 30, 1956, LOS: 0 ADMISSION DATE:  04/19/2019, CONSULTATION DATE:  7/9 REFERRING MD: Dr. Kathrynn Humble CHIEF COMPLAINT: Angioedema  Brief History   63 year old male on ACE inhibitor presented to ER with throat swelling shortly after eating a partially seafood dinner.  History of present illness   63 year old male with past medical history as below, which is significant for hypertension.  This is treated with lisinopril/HCTZ.  He was in his usual state of health until the p.m. hours of 7/8.  He ate dinner which consisted of chicken, fish, and shrimp.  He has been on lisinopril for "years" and has eaten these foods many times without complication in the past.  Shortly after dinner he began to feel his throat swell and become painful.  This continued to worsen until he began to experience shortness of breath.  For this reason he presented to [facility] emergency department in the early a.m. hours of 7/9.  On exam he did demonstrate some airway edema and PCCM was asked to evaluate for ICU admission and possible intubation.  Past Medical History   has a past medical history of Hypertension.   Significant Hospital Events   7/9 admit  Consults:  ENT 7/9 >  Procedures:    Significant Diagnostic Tests:    Micro Data:    Antimicrobials:     Interim history/subjective:    Objective   Blood pressure (!) 140/102, pulse 80, temperature 98.9 F (37.2 C), temperature source Oral, resp. rate 17, SpO2 98 %.       No intake or output data in the 24 hours ending 04/19/19 0256 There were no vitals filed for this visit.  Examination: General: Middle-aged overweight male in no acute distress HENT: Normocephalic, atraumatic.  Significant airway edema visible at the level of the uvula.  He has no stridor.  He is dysarthric and is having difficulty swallowing his secretions. Lungs: Clear bilateral breath sounds Cardiovascular: Regular rate and rhythm, no  MRG Abdomen: Soft, nontender, nondistended Extremities: No acute deformity or range of motion limitation Neuro: Alert, oriented, nonfocal   Resolved Hospital Problem list     Assessment & Plan:   Angioedema: No history of this.  No family history.  He is on ACE inhibitor and ate shellfish earlier tonight. -ENT consulted for laryngoscopic evaluation of the airway. -If patient requires intubation may be best to do in the operating room. -Check C3, C4, CRP, ESR -Consider FFP transfusion -Discontinue lisinopril -Benadryl, pepcid, and solumedrol scheduled.   AKI -Gentle hydration -Repeat BMP  Hypertension: -Holding home lisinopril  Best practice:  Diet: N.p.o. Pain/Anxiety/Delirium protocol (if indicated): N/A VAP protocol (if indicated): N/A DVT prophylaxis: SQ H GI prophylaxis: pepcid Glucose control: NA Mobility: Bedrest Code Status: Full code Family Communication: Patient updated Disposition: ICU  Labs   CBC: Recent Labs  Lab 04/19/19 0203  WBC 6.2  NEUTROABS 2.6  HGB 15.1  HCT 46.0  MCV 87.0  PLT 093    Basic Metabolic Panel: Recent Labs  Lab 04/19/19 0107  NA 139  K 4.2  CL 104  CO2 23  GLUCOSE 90  BUN 20  CREATININE 1.26*  CALCIUM 8.7*   GFR: CrCl cannot be calculated (Unknown ideal weight.). Recent Labs  Lab 04/19/19 0203  WBC 6.2    Liver Function Tests: Recent Labs  Lab 04/19/19 0107  AST 23  ALT 15  ALKPHOS 56  BILITOT 1.1  PROT 6.4*  ALBUMIN 3.1*  No results for input(s): LIPASE, AMYLASE in the last 168 hours. No results for input(s): AMMONIA in the last 168 hours.  ABG No results found for: PHART, PCO2ART, PO2ART, HCO3, TCO2, ACIDBASEDEF, O2SAT   Coagulation Profile: No results for input(s): INR, PROTIME in the last 168 hours.  Cardiac Enzymes: No results for input(s): CKTOTAL, CKMB, CKMBINDEX, TROPONINI in the last 168 hours.  HbA1C: No results found for: HGBA1C  CBG: No results for input(s): GLUCAP in the  last 168 hours.  Review of Systems:   Bolds are positive  Constitutional: weight loss, gain, night sweats, Fevers, chills, fatigue .  HEENT: headaches, Sore throat, sneezing, nasal congestion, post nasal drip, Difficulty swallowing, Tooth/dental problems, visual complaints visual changes, ear ache CV:  chest pain, radiates:,Orthopnea, PND, swelling in lower extremities, dizziness, palpitations, syncope.  GI  heartburn, indigestion, abdominal pain, nausea, vomiting, diarrhea, change in bowel habits, loss of appetite, bloody stools.  Resp: cough, productive:, hemoptysis, dyspnea, chest pain, pleuritic.  Skin: rash or itching or icterus GU: dysuria, change in color of urine, urgency or frequency. flank pain, hematuria  MS: joint pain or swelling. decreased range of motion  Psych: change in mood or affect. depression or anxiety.  Neuro: difficulty with speech, weakness, numbness, ataxia    Past Medical History  He,  has a past medical history of Hypertension.   Surgical History    Past Surgical History:  Procedure Laterality Date  . CHOLECYSTECTOMY    . OTHER SURGICAL HISTORY     "Shot a couple of times"     Social History   reports that he has been smoking cigarettes. He has been smoking about 0.25 packs per day. He has never used smokeless tobacco. He reports current alcohol use. He reports that he does not use drugs.   Family History   His family history is negative for Colon cancer.   Allergies Allergies  Allergen Reactions  . Codeine Nausea And Vomiting     Home Medications  Prior to Admission medications   Medication Sig Start Date End Date Taking? Authorizing Provider  amitriptyline (ELAVIL) 50 MG tablet Take 50 mg by mouth daily as needed. 02/08/18   [provider]  lisinopril-hydrochlorothiazide (PRINZIDE) 20-12.5 MG per tablet Take 1 tablet by mouth daily. 09/07/12   Louretta Shorten, MD  ranitidine (ZANTAC) 150 MG tablet Take 1 tablet (150 mg total) by mouth  2 (two) times daily. 04/07/18   Street, [place], PA-C  tamsulosin (FLOMAX) 0.4 MG CAPS capsule Take 0.4 mg by mouth daily. 02/08/18   [provider]     Critical care time: 40 mins    Georgann Housekeeper, AGACNP-BC [place] Pager [phone] or [phone]  04/19/2019 3:07 AM

## 2019-04-19 NOTE — Progress Notes (Signed)
PCCM progress note  Briefly this is a 63 y/o with angioedema secondary to ACEI vs shellfish  Pt seen and examined at morning rounds Has some throat discomfort. No difficulty breathing.  Continue steroids, pepcid, benadryl Ok for transfer to floor Will observe for 24 hrs. Likely discharge tomorrow.  Marshell Garfinkel MD [facility] 04/19/2019, 9:46 AM

## 2019-04-20 LAB — PREPARE FRESH FROZEN PLASMA: Unit division: 0

## 2019-04-20 LAB — BASIC METABOLIC PANEL
Anion gap: 10 (ref 5–15)
BUN: 17 mg/dL (ref 8–23)
CO2: 23 mmol/L (ref 22–32)
Calcium: 8.6 mg/dL — ABNORMAL LOW (ref 8.9–10.3)
Chloride: 103 mmol/L (ref 98–111)
Creatinine, Ser: 1.11 mg/dL (ref 0.61–1.24)
GFR calc Af Amer: >60 mL/min (ref >60)
GFR calc non Af Amer: >60 mL/min (ref >60)
Glucose, Bld: 142 mg/dL — ABNORMAL HIGH (ref 70–99)
Potassium: 4.1 mmol/L (ref 3.5–5.1)
Sodium: 136 mmol/L (ref 135–145)

## 2019-04-20 LAB — BPAM FFP
Blood Product Expiration Date: 202007142359
Blood Product Expiration Date: 202007142359
Blood Product Expiration Date: 202007142359
ISSUE DATE / TIME: 202007090920
ISSUE DATE / TIME: 202007100416
ISSUE DATE / TIME: 202007100416
Unit Type and Rh: 7300
Unit Type and Rh: 7300
Unit Type and Rh: 7300

## 2019-04-20 MED ORDER — AMLODIPINE BESYLATE 5 MG PO TABS: 5.0000 mg | ORAL_TABLET | Freq: Every day | ORAL | 1 refills | Status: DC

## 2019-04-20 MED ORDER — PREDNISONE 10 MG PO TABS: ORAL_TABLET | ORAL | 0 refills | Status: DC

## 2019-04-20 MED ORDER — HYDROCHLOROTHIAZIDE 12.5 MG PO CAPS: 12.5000 mg | ORAL_CAPSULE | Freq: Every day | ORAL | 1 refills | Status: DC

## 2019-04-20 MED FILL — predniSONE 10 MG TABS: 10 | 16 days supply | Qty: 40 | Fill #0

## 2019-04-20 MED FILL — HYDROCHLOROTHIAZIDE 12.5 MG: 12.5 | 30 days supply | Qty: 30 | Fill #0

## 2019-04-20 MED FILL — AMLODIPINE BESYLATE 5 MG TA: 5 | 30 days supply | Qty: 30 | Fill #0

## 2019-04-20 NOTE — Discharge Instructions (Signed)
Do not take lisinopril or any ACE-I again. You will need to see Allergy specialist, please have Dr. Lavonia Drafts to set up for you. Go to ER immediatly for any throat welling.

## 2019-04-20 NOTE — TOC Initial Note (Addendum)
Transition of Care [facility]) - Initial/Assessment Note    Patient Details  Name: Christopher Wilkins MRN: [id number] Date of Birth: 02-01-1956  Transition [facility] CM/SW Contact:    Bartholomew Crews, RN Phone Number: [phone] 04/20/2019, 11:32 AM  Clinical Narrative:                 Spoke with patient at the bedside. 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 Insurance is currently inactive. Lives in boarding house - friends with the other boarders. Drove himself to hospital - advised to call 911 in an emergency in the future - discussed that paramedics initiate treatment right away. Patient states that he feels good, and can drive self. Discussed Match with override for copays to assist with medications - patient in agreement. Reviewed meds with patient and noted through GoodRx app that most medications on [facility] $4 list. No HH or DME needs. PCP at [facility] - patient reports that an appointment has been scheduled. No other transition of care needs identified at this time. Patient to transition home today.   Expected Discharge Plan: Home/Self Care Barriers to Discharge: No Barriers Identified   Patient Goals and CMS Choice Patient states their goals for this hospitalization and ongoing recovery are:: ready to go home   Choice offered to / list presented to : NA  Expected Discharge Plan and Services Expected Discharge Plan: Home/Self Care In-house Referral: Financial Counselor Discharge Planning Services: CM Consult, [facility] Program Post Acute Care Choice: NA Living arrangements for the past 2 months: [place] Expected Discharge Date: 04/20/19               DME Arranged: N/A DME Agency: NA       HH Arranged: NA HH Agency: NA        Prior Living Arrangements/Services Living arrangements for the past 2 months: [place] with:: Self   Do you feel safe going back to the place where you live?: Yes            Criminal  Activity/Legal Involvement Pertinent to Current Situation/Hospitalization: No - Comment as needed  Activities of Daily Living Home Assistive Devices/Equipment: None ADL Screening (condition at time of admission) Patient's cognitive ability adequate to safely complete daily activities?: Yes Is the patient deaf or have difficulty hearing?: No Does the patient have difficulty seeing, even when wearing glasses/contacts?: No Does the patient have difficulty concentrating, remembering, or making decisions?: No Patient able to express need for assistance with ADLs?: Yes Does the patient have difficulty dressing or bathing?: No Independently performs ADLs?: Yes (appropriate for developmental age) Does the patient have difficulty walking or climbing stairs?: No Weakness of Legs: None Weakness of Arms/Hands: None  Permission Sought/Granted                  Emotional Assessment   Attitude/Demeanor/Rapport: Engaged Affect (typically observed): Accepting Orientation: : Oriented to Situation, Oriented to  Time, Oriented to Place, Oriented to Self Alcohol / Substance Use: Not Applicable Psych Involvement: No (comment)  Admission diagnosis:  Angioedema, initial encounter [T78.3XXA] Patient Active Problem List   Diagnosis Date Noted  . Angioedema 04/19/2019  . AKI (acute kidney injury) ([place])    PCP:  Rogers Blocker, MD Pharmacy:   [id number] [address], [place] [address] Phone: [phone] Fax: [phone]  [facility] [place] [place] -  [address] [place] [address] Phone: [phone] Fax: [phone]     Social Determinants of Health (SDOH) Interventions    Readmission Risk Interventions No flowsheet data found.

## 2019-04-20 NOTE — Discharge Summary (Addendum)
Physician Discharge Summary  Patient ID: Christopher Wilkins MRN: [id number] DOB/AGE: 17-Nov-1955 63 y.o.  Admit date: 04/19/2019 Discharge date: 04/20/2019  Problem List Active Problems:   Angioedema   AKI (acute kidney injury) [facility])  HPI: 63 year old male with past medical history as below, which is significant for hypertension.  This is treated with lisinopril/HCTZ.  He was in his usual state of health until the p.m. hours of 7/8.  He ate dinner which consisted of chicken, fish, and shrimp.  He has been on lisinopril for "years" and has eaten these foods many times without complication in the past.  Shortly after dinner he began to feel his throat swell and become painful.  This continued to worsen until he began to experience shortness of breath.  For this reason he presented to [facility] emergency department in the early a.m. hours of 7/9.  On exam he did demonstrate some airway edema and PCCM was asked to evaluate for ICU admission and possible intubation. Note: This is exertional throat episode of facial swelling and difficulty swallowing and breathing.  Previous 2 times he self treated with over-the-counter Benadryl.  He does note that each episode is getting worse and now episode before. Hospital Course:  Assessment & Plan:   Angioedema: No history of this.  No family history.  He is on ACE inhibitor and ate shellfish earlier tonight.  Dr. [name] of ear nose and throat was consulted 04/19/2019.  He found the patient in no acute distress.  No further interventions were required.  He agreed with medical management. Medical management with steroids H2 blockers antihistamines.  He reached maximal hospital benefit on 04/20/2019 with resolution of all throat swelling.  Will be discharged on prednisone taper, his lisinopril hydrochlorothiazide is been changed to Norvasc and hydrochlorothiazide, he has been placed on a prednisone taper and is to follow-up with his primary care physician Dr. Lavonia Drafts as  instructed.  He is also been instructed to avoid all ACE inhibitors in the future.  And to avoid shellfish until he seen by an allergist.  We have asked Dr. Lavonia Drafts to arrange for allergy specialist consult in the near future. This is actually her third episode of angioedema previous 2 were self medicated with over-the-counter Benadryl.  He was heavily instructed to avoid ACE inhibitor's, avoid shellfish and he will need an allergy consult once he has been seen by Dr. Lavonia Drafts of primary care.  AKI Lab Results  Component Value Date   CREATININE 1.11 04/20/2019   CREATININE 1.11 04/19/2019   CREATININE 1.26 (H) 04/19/2019   Resolved with gentle hydration  Hypertension: We stopped his ACE inhibitor's. Been placed on Norvasc and hydrochlorothiazide Instructed to avoid ACE inhibitors  Best practice:  Diet:  As tolerated but to avoid shellfish at this time Pain/Anxiety/Delirium protocol (if indicated): N/A VAP protocol (if indicated): N/A DVT prophylaxis: SQ H GI prophylaxis: pepcid Glucose control: NA Mobility: Bedrest Code Status: Full code Family Communication: Patient updated.  He has been instructed to avoid all ACE inhibitors in any form.  Avoid shellfish at this time.  And needs allergy consult in the near future.  He has been given replacements for his ACE inhibitor with Norvasc 5 mg daily.  He has been placed on a prednisone taper to taper over 16 days. Disposition: ICU  General: Well-nourished well-developed male no acute distress. HEENT: Phonates without difficulty, no evidence of any swelling at this time.  Oropharynx lingula or neck negative Neuro: Grossly intact CV: s1s2 rrr, no  m/r/g PULM: even/non-labored, lungs bilaterally clear to auscultation VP:XTGG, non-tender, bsx4 active  Extremities: warm/dry, good edema  Skin: no rashes or lesions   Labs at discharge Lab Results  Component Value Date   CREATININE 1.11 04/20/2019   BUN 17 04/20/2019   NA 136 04/20/2019   K  4.1 04/20/2019   CL 103 04/20/2019   CO2 23 04/20/2019   Lab Results  Component Value Date   WBC 9.2 04/19/2019   HGB 15.4 04/19/2019   HCT 46.4 04/19/2019   MCV 86.6 04/19/2019   PLT 249 04/19/2019   Lab Results  Component Value Date   ALT 15 04/19/2019   AST 23 04/19/2019   ALKPHOS 56 04/19/2019   BILITOT 1.1 04/19/2019   No results found for: INR, PROTIME  Current radiology studies No results found.  Disposition:  Discharge disposition: 01-Home or Self Care        Allergies as of 04/20/2019      Reactions   Codeine Nausea And Vomiting      Medication List    STOP taking these medications   lisinopril-hydrochlorothiazide 20-12.5 MG tablet Commonly known as: Prinzide     TAKE these medications   amitriptyline 50 MG tablet Commonly known as: ELAVIL Take 50 mg by mouth daily as needed.   amLODipine 5 MG tablet Commonly known as: NORVASC Take 1 tablet (5 mg total) by mouth daily.   hydrochlorothiazide 12.5 MG capsule Commonly known as: MICROZIDE Take 1 capsule (12.5 mg total) by mouth daily.   predniSONE 10 MG tablet Commonly known as: DELTASONE Prednisone 10 mg tabs Take 4 tabs  daily with food x 4 days, then 3 tabs daily x 4 days, then 2 tabs daily x 4 days, then 1 tab daily x4 days then stop. #40   ranitidine 150 MG tablet Commonly known as: ZANTAC Take 1 tablet (150 mg total) by mouth 2 (two) times daily.   tamsulosin 0.4 MG Caps capsule Commonly known as: FLOMAX Take 0.4 mg by mouth daily.      Follow-up Information    Vassie Moment, MD Follow up on 04/27/2019.   Specialty: Family Medicine Why: Dr. Lavonia Drafts at 1:30 pm Contact information: [place] [id number] [phone]            Discharged Condition: good  Time spent on discharge  40 minutes.  Vital signs at Discharge. Temp:  [98.2 F (36.8 C)-98.6 F (37 C)] 98.3 F (36.8 C) (07/10 0924) Pulse Rate:  [60-76] 76 (07/10 0924) Resp:  [11-18] 18 (07/10  0924) BP: (127-148)/(55-103) 136/55 (07/10 0924) SpO2:  [98 %-100 %] 100 % (07/10 0924) Weight:  [87.6 kg] 87.6 kg (07/10 0456) Office follow up Special Information or instructions. Follow-up with Dr. Lavonia Drafts on this Sunday.  He will need a referral to allergy specialist in the near future.  He has been instructed to never again take ACE inhibitor's. Placed on Norvasc hydrochlorothiazide and a prednisone taper at time of discharge.  He has been discharged home with low resolution of all angioedema symptoms. Signed: Richardson Landry Kian Gamarra ACNP Maryanna Shape PCCM Pager [phone] till 1 pm If no answer page [phone] 04/20/2019, 10:07 AM

## 2019-04-20 NOTE — Progress Notes (Signed)
[name] to be discharged Home per MD order. Discussed prescriptions and follow up appointments with the patient. Prescriptions explained to patient; medication list explained in detail by Emeterio Reeve. Patient verbalized understanding.  Skin clean, dry and intact without evidence of skin break down, no evidence of skin tears noted. IV catheter discontinued intact. Site without signs and symptoms of complications. Dressing and pressure applied. Pt denies pain at the site currently. No complaints noted.  Patient free of lines, drains, and wounds.   An After Visit Summary (AVS) was printed and given to the patient. Patient walked out of the unit, refused wheelchair.  Amaryllis Dyke, RN

## 2019-04-24 LAB — C3 COMPLEMENT: C3 Complement: 132 mg/dL (ref 82–167)

## 2019-04-24 LAB — C4 COMPLEMENT: Complement C4, Body Fluid: 22 mg/dL (ref 14–44)

## 2019-08-27 ENCOUNTER — Other Ambulatory Visit: Payer: Self-pay

## 2019-08-27 ENCOUNTER — Emergency Department (HOSPITAL_COMMUNITY)
Admission: EM | Admit: 2019-08-27 | Discharge: 2019-08-27 | Disposition: A | Discharge status: Home / Self Care | Payer: Self-pay | Attending: Emergency Medicine | Admitting: Emergency Medicine

## 2019-08-27 ENCOUNTER — Emergency Department (HOSPITAL_COMMUNITY): Payer: Self-pay

## 2019-08-27 DIAGNOSIS — J189 Pneumonia, unspecified organism: Secondary | ICD-10-CM | POA: Insufficient documentation

## 2019-08-27 DIAGNOSIS — F17210 Nicotine dependence, cigarettes, uncomplicated: Secondary | ICD-10-CM | POA: Insufficient documentation

## 2019-08-27 DIAGNOSIS — Z79899 Other long term (current) drug therapy: Secondary | ICD-10-CM | POA: Insufficient documentation

## 2019-08-27 DIAGNOSIS — I498 Other specified cardiac arrhythmias: Secondary | ICD-10-CM | POA: Insufficient documentation

## 2019-08-27 DIAGNOSIS — E876 Hypokalemia: Secondary | ICD-10-CM | POA: Insufficient documentation

## 2019-08-27 DIAGNOSIS — Z20822 Contact with and (suspected) exposure to covid-19: Secondary | ICD-10-CM

## 2019-08-27 DIAGNOSIS — Z20828 Contact with and (suspected) exposure to other viral communicable diseases: Secondary | ICD-10-CM | POA: Insufficient documentation

## 2019-08-27 DIAGNOSIS — I10 Essential (primary) hypertension: Secondary | ICD-10-CM | POA: Insufficient documentation

## 2019-08-27 LAB — BASIC METABOLIC PANEL
Anion gap: 12 (ref 5–15)
BUN: 10 mg/dL (ref 8–23)
CO2: 20 mmol/L — ABNORMAL LOW (ref 22–32)
Calcium: 8.6 mg/dL — ABNORMAL LOW (ref 8.9–10.3)
Chloride: 108 mmol/L (ref 98–111)
Creatinine, Ser: 1.02 mg/dL (ref 0.61–1.24)
GFR calc Af Amer: >60 mL/min (ref >60)
GFR calc non Af Amer: >60 mL/min (ref >60)
Glucose, Bld: 97 mg/dL (ref 70–99)
Potassium: 3.2 mmol/L — ABNORMAL LOW (ref 3.5–5.1)
Sodium: 140 mmol/L (ref 135–145)

## 2019-08-27 LAB — CBC
HCT: 50.8 % (ref 39.0–52.0)
Hemoglobin: 16.7 g/dL (ref 13.0–17.0)
MCH: 28.2 pg (ref 26.0–34.0)
MCHC: 32.9 g/dL (ref 30.0–36.0)
MCV: 85.7 fL (ref 80.0–100.0)
Platelets: 297 10*3/uL (ref 150–400)
RBC: 5.93 MIL/uL — ABNORMAL HIGH (ref 4.22–5.81)
RDW: 14 % (ref 11.5–15.5)
WBC: 6.6 10*3/uL (ref 4.0–10.5)
nRBC: 0 % (ref 0.0–0.2)

## 2019-08-27 LAB — TROPONIN I (HIGH SENSITIVITY): Troponin I (High Sensitivity): 37 ng/L — ABNORMAL HIGH (ref <18)

## 2019-08-27 LAB — MAGNESIUM: Magnesium: 1.8 mg/dL (ref 1.7–2.4)

## 2019-08-27 LAB — SARS CORONAVIRUS 2 (TAT 6-24 HRS): SARS Coronavirus 2: NEGATIVE

## 2019-08-27 MED ORDER — POTASSIUM CHLORIDE ER 10 MEQ PO TBCR: 20.0000 meq | EXTENDED_RELEASE_TABLET | Freq: Every day | ORAL | 0 refills | Status: DC

## 2019-08-27 MED ORDER — AEROCHAMBER PLUS FLO-VU LARGE MISC: 1.0000 | Freq: Once | Status: DC

## 2019-08-27 MED ORDER — ALBUTEROL SULFATE HFA 108 (90 BASE) MCG/ACT IN AERS
5.0000 | INHALATION_SPRAY | Freq: Once | RESPIRATORY_TRACT | Status: AC
  Administered 2019-08-27: 5 via RESPIRATORY_TRACT
  Filled 2019-08-27: qty 6.7

## 2019-08-27 MED ORDER — DOXYCYCLINE HYCLATE 100 MG PO CAPS: 100.0000 mg | ORAL_CAPSULE | Freq: Two times a day (BID) | ORAL | 0 refills | Status: AC

## 2019-08-27 MED ORDER — DOXYCYCLINE HYCLATE 100 MG PO TABS
100.0000 mg | ORAL_TABLET | Freq: Once | ORAL | Status: AC
  Administered 2019-08-27: 11:00:00 100 mg via ORAL
  Filled 2019-08-27: qty 1

## 2019-08-27 NOTE — Discharge Instructions (Signed)
Take the antibiotics as directed unless we inform you that your COVID-19 test is positive. Use the albuterol to help with your shortness of breath. Return to the ED if you start to have worsening shortness of breath, develop chest pain, leg swelling, lightheadedness or coughing up blood.

## 2019-08-27 NOTE — ED Triage Notes (Signed)
Pt endorses cough, shob and chills x 2 days. Afebrile in triage. 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 VSS. No cardiac hx.

## 2019-08-27 NOTE — ED Provider Notes (Signed)
[facility] EMERGENCY DEPARTMENT Provider Note   CSN: [id number] Arrival date & time: 08/27/19  0825     History   Chief Complaint Chief Complaint  Patient presents with  . Cough  . Shortness of Breath    HPI Christopher Wilkins is a 63 y.o. male with a past medical history of hypertension presenting to the ED with a chief complaint of cough and shortness of breath.  Symptoms have been going on for the past 2 days and worsened this morning.  Cough is productive with yellow mucus.  Reports associated diarrhea as well.  He reports chills but denies documented fever.  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  He denies any chest pain.  He has not tried any over-the-counter medications to help with his symptoms.  Denies any leg swelling, abdominal pain, vomiting, wheezing, headache.  Denies history of lung disease but states that he does smoke cigarettes.     HPI  Past Medical History:  Diagnosis Date  . Hypertension     Patient Active Problem List   Diagnosis Date Noted  . Angioedema 04/19/2019  . AKI (acute kidney injury) [facility])     Past Surgical History:  Procedure Laterality Date  . CHOLECYSTECTOMY    . OTHER SURGICAL HISTORY     "Shot a couple of times"        Home Medications    Prior to Admission medications   Medication Sig Start Date End Date Taking? Authorizing Provider  amitriptyline (ELAVIL) 50 MG tablet Take 50 mg by mouth daily as needed. 02/08/18   [provider]  amLODipine (NORVASC) 5 MG tablet Take 1 tablet (5 mg total) by mouth daily. 04/20/19   Minor, Grace Bushy, NP  doxycycline (VIBRAMYCIN) 100 MG capsule Take 1 capsule (100 mg total) by mouth 2 (two) times daily for 7 days. 08/27/19 09/03/19  Heliodoro Domagalski, PA-C  hydrochlorothiazide (MICROZIDE) 12.5 MG capsule Take 1 capsule (12.5 mg total) by mouth daily. 04/20/19   Minor,  Grace Bushy, NP  potassium chloride (KLOR-CON) 10 MEQ tablet Take 2 tablets (20 mEq total) by mouth daily for 2 days. 08/27/19 08/29/19  Amori Cooperman, PA-C  predniSONE (DELTASONE) 10 MG tablet Prednisone 10 mg tabs Take 4 tabs  daily with food x 4 days, then 3 tabs daily x 4 days, then 2 tabs daily x 4 days, then 1 tab daily x4 days then stop. #40 04/20/19   Minor, Grace Bushy, NP  ranitidine (ZANTAC) 150 MG tablet Take 1 tablet (150 mg total) by mouth 2 (two) times daily. Patient not taking: Reported on 04/19/2019 04/07/18   Street, [place], PA-C  tamsulosin (FLOMAX) 0.4 MG CAPS capsule Take 0.4 mg by mouth daily. 02/08/18   [provider]    Family History Family History  Problem Relation Age of Onset  . Colon cancer Neg Hx     Social History Social History   Tobacco Use  . Smoking status: Current Every Day Smoker    Packs/day: 0.25    Types: Cigarettes  . Smokeless tobacco: Never Used  Substance Use Topics  . Alcohol use: Yes    Alcohol/week: 0.0 standard drinks    Comment: occasionally; once every 2 weeks  . Drug use: No     Allergies   Codeine   Review of Systems Review of Systems  Constitutional: Positive for chills. Negative for appetite  change and fever.  HENT: Negative for ear pain, rhinorrhea, sneezing and sore throat.   Eyes: Negative for photophobia and visual disturbance.  Respiratory: Positive for cough and shortness of breath. Negative for chest tightness and wheezing.   Cardiovascular: Negative for chest pain and palpitations.  Gastrointestinal: Positive for diarrhea. Negative for abdominal pain, blood in stool, constipation, nausea and vomiting.  Genitourinary: Negative for dysuria, hematuria and urgency.  Musculoskeletal: Negative for myalgias.  Skin: Negative for rash.  Neurological: Negative for dizziness, weakness and light-headedness.     Physical Exam Updated Vital Signs BP (!) 146/81 (BP Location: Right Arm)   Pulse 91   Temp (!) 97.4 F  (36.3 C) (Oral)   Resp 17   SpO2 99%   Physical Exam Vitals signs and nursing note reviewed.  Constitutional:      General: He is not in acute distress.    Appearance: He is well-developed.  HENT:     Head: Normocephalic and atraumatic.     Nose: Nose normal.  Eyes:     General: No scleral icterus.       Left eye: No discharge.     Conjunctiva/sclera: Conjunctivae normal.  Neck:     Musculoskeletal: Normal range of motion and neck supple.  Cardiovascular:     Rate and Rhythm: Normal rate and regular rhythm.     Heart sounds: Normal heart sounds. No murmur. No friction rub. No gallop.   Pulmonary:     Effort: Pulmonary effort is normal. No respiratory distress.     Breath sounds: Normal breath sounds.  Abdominal:     General: Bowel sounds are normal. There is no distension.     Palpations: Abdomen is soft.     Tenderness: There is no abdominal tenderness. There is no guarding.  Musculoskeletal: Normal range of motion.  Skin:    General: Skin is warm and dry.     Findings: No rash.  Neurological:     Mental Status: He is alert.     Motor: No abnormal muscle tone.     Coordination: Coordination normal.      ED Treatments / Results  Labs (all labs ordered are listed, but only abnormal results are displayed) Labs Reviewed  BASIC METABOLIC PANEL - Abnormal; Notable for the following components:      Result Value   Potassium 3.2 (*)    CO2 20 (*)    Calcium 8.6 (*)    All other components within normal limits  CBC - Abnormal; Notable for the following components:   RBC 5.93 (*)    All other components within normal limits  TROPONIN I (HIGH SENSITIVITY) - Abnormal; Notable for the following components:   Troponin I (High Sensitivity) 37 (*)    All other components within normal limits  SARS CORONAVIRUS 2 (TAT 6-24 HRS)  MAGNESIUM    EKG EKG Interpretation  Date/Time:  Monday August 27 2019 08:34:03 EST Ventricular Rate:  94 PR Interval:  150 QRS Duration:  90 QT Interval:  406 QTC Calculation: 507 R Axis:   46 Text Interpretation: Sinus rhythm with frequent Premature ventricular complexes in a pattern of bigeminy Biatrial enlargement Left ventricular hypertrophy ( Sokolow-Lyon , Cornell product ) T wave abnormality, consider lateral ischemia Prolonged QT Abnormal ECG in a pattern of bigeminy - new Nonspecific ST and T wave abnormality Confirmed by Varney Biles [phone]) on 08/27/2019 10:10:55 AM   Radiology Dg Chest 2 View  Result Date: 08/27/2019 CLINICAL DATA:  Shortness of breath  with cough EXAM: CHEST - 2 VIEW COMPARISON:  April 07, 2018 FINDINGS: There are suspected nipple shadows on each side. There is subtle ill-defined opacity in the posterior right base, concerning for focal pneumonia. Lungs elsewhere clear. Heart size and pulmonary vascularity normal. No adenopathy. No bone lesions. IMPRESSION: 1. Subtle infiltrate posterior right base, suspicious for early pneumonia. 2. Probable nipple shadows; repeat study with nipple markers could confirm. Followup PA and lateral chest radiographs recommended in 3-4 weeks following trial of antibiotic therapy to ensure resolution and exclude underlying malignancy. Electronically Signed   By: Lowella Grip III M.D.   On: 08/27/2019 09:13    Procedures Procedures (including critical care time)  Medications Ordered in ED Medications  AeroChamber Plus Flo-Vu Large MISC 1 each (0 each Other Hold 08/27/19 1150)  doxycycline (VIBRA-TABS) tablet 100 mg (100 mg Oral Given 08/27/19 1039)  albuterol (VENTOLIN HFA) 108 (90 Base) MCG/ACT inhaler 5 puff (5 puffs Inhalation Given 08/27/19 1149)     Initial Impression / Assessment and Plan / ED Course  I have reviewed the triage vital signs and the nursing notes.  Pertinent labs & imaging results that were available during my care of the patient were reviewed by me and considered in my medical decision making (see chart for details).  Clinical Course as  of Aug 26 1418  Mon Aug 27, 2019  1039 Low suspicion for ACS, will d/c delta troponin due to EKG findings.   [HK]    Clinical Course User Index [HK] Delia Heady, PA-C       MARCKUS STEFANOVICH was evaluated in Emergency Department on 08/27/19  for the symptoms described in the history of present illness. He/she was evaluated in the context of the global COVID-19 pandemic, which necessitated consideration that the patient might be at risk for infection with the SARS-CoV-2 virus that causes COVID-19. Institutional protocols and algorithms that pertain to the evaluation of patients at risk for COVID-19 are in a state of rapid change based on information released by regulatory bodies including the CDC and federal and state organizations. These policies and algorithms were followed during the patient's care in the ED.  63 year old male with a past medical history of HTN presents to ED for cough and SOB. Symptoms going on for 2 days and worsened this morning.  Reports her cough is productive with mucus reports chills but no documented fever. Does work at [employer] without known COVID-19 exposures.  On exam is overall well appearing. EKG shows bigeminy. Low suspicion for ACS, doubt this as the cause of the slightly elevated troponin. Mild hypokalemia of 3.2 was repleted orally. CXR shows RLL pneumonia.  Covid test is pending.  Will treat with Doxy until Covid test returns.  Have also given him with spacer.  Will replete K as well. Patient oxygen saturations above 95% on RA. Do not feel he needs admission at this time and patient is agreeable to plan.  Patient is hemodynamically stable, in NAD, and able to ambulate in the ED. Evaluation does not show pathology that would require ongoing emergent intervention or inpatient treatment. I explained the diagnosis to the patient. Pain has been managed and has no complaints prior to discharge. Patient is comfortable with above plan and is stable for discharge at this time.  All questions were answered prior to disposition. Strict return precautions for returning to the ED were discussed. Encouraged follow up with PCP.   An After Visit Summary was printed and given to the patient.  Portions of this note were generated with [employer]. Dictation errors may occur despite best attempts at proofreading.     Final Clinical Impressions(s) / ED Diagnoses   Final diagnoses:  Suspected COVID-19 virus infection  Hypokalemia  Bigeminy  Community acquired pneumonia of right lower lobe of lung    ED Discharge Orders         Ordered    doxycycline (VIBRAMYCIN) 100 MG capsule  2 times daily     08/27/19 1414    potassium chloride (KLOR-CON) 10 MEQ tablet  Daily     08/27/19 1418           Delia Heady, PA-C 08/27/19 1419    Varney Biles, MD 08/28/19 1550

## 2019-08-27 NOTE — ED Notes (Signed)
Patient Alert and oriented to baseline. Stable and ambulatory to baseline. Patient verbalized understanding of the discharge instructions.  Patient belongings were taken by the patient.   

## 2020-06-18 ENCOUNTER — Emergency Department (HOSPITAL_COMMUNITY): Payer: Medicare Other

## 2020-06-18 ENCOUNTER — Other Ambulatory Visit: Payer: Self-pay

## 2020-06-18 ENCOUNTER — Inpatient Hospital Stay (HOSPITAL_COMMUNITY)
Admission: EM | Admit: 2020-06-18 | Discharge: 2020-06-22 | DRG: 308 | Disposition: A | Discharge status: Home / Self Care | Payer: Medicare Other | Attending: Family Medicine | Admitting: Family Medicine

## 2020-06-18 ENCOUNTER — Encounter (HOSPITAL_COMMUNITY): Payer: Self-pay

## 2020-06-18 DIAGNOSIS — Z6832 Body mass index (BMI) 32.0-32.9, adult: Secondary | ICD-10-CM | POA: Diagnosis not present

## 2020-06-18 DIAGNOSIS — I5041 Acute combined systolic (congestive) and diastolic (congestive) heart failure: Secondary | ICD-10-CM | POA: Diagnosis present

## 2020-06-18 DIAGNOSIS — E669 Obesity, unspecified: Secondary | ICD-10-CM | POA: Diagnosis present

## 2020-06-18 DIAGNOSIS — I248 Other forms of acute ischemic heart disease: Secondary | ICD-10-CM | POA: Diagnosis not present

## 2020-06-18 DIAGNOSIS — Z79899 Other long term (current) drug therapy: Secondary | ICD-10-CM | POA: Diagnosis not present

## 2020-06-18 DIAGNOSIS — F17213 Nicotine dependence, cigarettes, with withdrawal: Secondary | ICD-10-CM | POA: Diagnosis present

## 2020-06-18 DIAGNOSIS — Z20822 Contact with and (suspected) exposure to covid-19: Secondary | ICD-10-CM | POA: Diagnosis present

## 2020-06-18 DIAGNOSIS — I10 Essential (primary) hypertension: Secondary | ICD-10-CM | POA: Diagnosis not present

## 2020-06-18 DIAGNOSIS — R0602 Shortness of breath: Secondary | ICD-10-CM | POA: Diagnosis present

## 2020-06-18 DIAGNOSIS — Z888 Allergy status to other drugs, medicaments and biological substances status: Secondary | ICD-10-CM | POA: Diagnosis not present

## 2020-06-18 DIAGNOSIS — I083 Combined rheumatic disorders of mitral, aortic and tricuspid valves: Secondary | ICD-10-CM | POA: Diagnosis present

## 2020-06-18 DIAGNOSIS — I959 Hypotension, unspecified: Secondary | ICD-10-CM | POA: Diagnosis not present

## 2020-06-18 DIAGNOSIS — Z91013 Allergy to seafood: Secondary | ICD-10-CM

## 2020-06-18 DIAGNOSIS — I110 Hypertensive heart disease with heart failure: Secondary | ICD-10-CM | POA: Diagnosis present

## 2020-06-18 DIAGNOSIS — I493 Ventricular premature depolarization: Secondary | ICD-10-CM | POA: Diagnosis present

## 2020-06-18 DIAGNOSIS — I4891 Unspecified atrial fibrillation: Secondary | ICD-10-CM | POA: Diagnosis present

## 2020-06-18 DIAGNOSIS — I340 Nonrheumatic mitral (valve) insufficiency: Secondary | ICD-10-CM | POA: Diagnosis not present

## 2020-06-18 LAB — CBC
HCT: 47 % (ref 39.0–52.0)
Hemoglobin: 15.4 g/dL (ref 13.0–17.0)
MCH: 28.5 pg (ref 26.0–34.0)
MCHC: 32.8 g/dL (ref 30.0–36.0)
MCV: 86.9 fL (ref 80.0–100.0)
Platelets: 288 10*3/uL (ref 150–400)
RBC: 5.41 MIL/uL (ref 4.22–5.81)
RDW: 14.4 % (ref 11.5–15.5)
WBC: 5.6 10*3/uL (ref 4.0–10.5)
nRBC: 0 % (ref 0.0–0.2)

## 2020-06-18 LAB — MAGNESIUM: Magnesium: 2.2 mg/dL (ref 1.7–2.4)

## 2020-06-18 LAB — TROPONIN I (HIGH SENSITIVITY)
Troponin I (High Sensitivity): 55 ng/L — ABNORMAL HIGH (ref <18)
Troponin I (High Sensitivity): 53 ng/L — ABNORMAL HIGH (ref <18)
Troponin I (High Sensitivity): 53 ng/L — ABNORMAL HIGH (ref <18)

## 2020-06-18 LAB — BASIC METABOLIC PANEL
Anion gap: 10 (ref 5–15)
BUN: 22 mg/dL (ref 8–23)
CO2: 23 mmol/L (ref 22–32)
Calcium: 8.9 mg/dL (ref 8.9–10.3)
Chloride: 106 mmol/L (ref 98–111)
Creatinine, Ser: 1.25 mg/dL — ABNORMAL HIGH (ref 0.61–1.24)
GFR calc Af Amer: >60 mL/min (ref >60)
GFR calc non Af Amer: >60 mL/min (ref >60)
Glucose, Bld: 100 mg/dL — ABNORMAL HIGH (ref 70–99)
Potassium: 4 mmol/L (ref 3.5–5.1)
Sodium: 139 mmol/L (ref 135–145)

## 2020-06-18 LAB — BRAIN NATRIURETIC PEPTIDE: B Natriuretic Peptide: 444.2 pg/mL — ABNORMAL HIGH (ref 0.0–100.0)

## 2020-06-18 LAB — SARS CORONAVIRUS 2 BY RT PCR (HOSPITAL ORDER, PERFORMED IN ~~LOC~~ HOSPITAL LAB): SARS Coronavirus 2: NEGATIVE

## 2020-06-18 MED ORDER — ONDANSETRON HCL 4 MG/2ML IJ SOLN: 4.0000 mg | Freq: Four times a day (QID) | INTRAMUSCULAR | Status: DC | PRN

## 2020-06-18 MED ORDER — METOPROLOL TARTRATE 12.5 MG HALF TABLET
12.5000 mg | ORAL_TABLET | Freq: Two times a day (BID) | ORAL | Status: DC
  Administered 2020-06-18 – 2020-06-19 (×2): 12.5 mg via ORAL
  Filled 2020-06-18 (×2): qty 1

## 2020-06-18 MED ORDER — ACETAMINOPHEN 325 MG PO TABS: 650.0000 mg | ORAL_TABLET | Freq: Four times a day (QID) | ORAL | Status: DC | PRN

## 2020-06-18 MED ORDER — DILTIAZEM HCL-DEXTROSE 125-5 MG/125ML-% IV SOLN (PREMIX)
5.0000 mg/h | INTRAVENOUS | Status: DC
  Administered 2020-06-18: 5 mg/h via INTRAVENOUS
  Filled 2020-06-18: qty 125

## 2020-06-18 MED ORDER — HEPARIN BOLUS VIA INFUSION
5000.0000 [IU] | Freq: Once | INTRAVENOUS | Status: AC
  Administered 2020-06-18: 5000 [IU] via INTRAVENOUS
  Filled 2020-06-18: qty 5000

## 2020-06-18 MED ORDER — LEVALBUTEROL HCL 0.63 MG/3ML IN NEBU: 0.6300 mg | INHALATION_SOLUTION | Freq: Four times a day (QID) | RESPIRATORY_TRACT | Status: DC | PRN

## 2020-06-18 MED ORDER — HEPARIN (PORCINE) 25000 UT/250ML-% IV SOLN
1200.0000 [IU]/h | INTRAVENOUS | Status: DC
  Administered 2020-06-18: 1200 [IU]/h via INTRAVENOUS
  Filled 2020-06-18: qty 250

## 2020-06-18 MED ORDER — FUROSEMIDE 10 MG/ML IJ SOLN
20.0000 mg | Freq: Once | INTRAMUSCULAR | Status: AC
  Administered 2020-06-18: 20 mg via INTRAVENOUS
  Filled 2020-06-18: qty 2

## 2020-06-18 MED ORDER — DILTIAZEM LOAD VIA INFUSION
15.0000 mg | Freq: Once | INTRAVENOUS | Status: AC
  Administered 2020-06-18: 15 mg via INTRAVENOUS
  Filled 2020-06-18: qty 15

## 2020-06-18 MED ORDER — NICOTINE 14 MG/24HR TD PT24
14.0000 mg | MEDICATED_PATCH | Freq: Every day | TRANSDERMAL | Status: DC
  Administered 2020-06-18 – 2020-06-22 (×5): 14 mg via TRANSDERMAL
  Filled 2020-06-18 (×5): qty 1
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
## 2020-06-18 NOTE — ED Notes (Signed)
Lorriane Shire sister [phone] would like an update on the pt

## 2020-06-18 NOTE — Progress Notes (Signed)
[place] for Heparin Indication: atrial fibrillation  Allergies  Allergen Reactions  . Codeine Nausea And Vomiting  . Lisinopril Other (See Comments)    "Didn't do well in my body"  . Shellfish Allergy Other (See Comments)    Can tolerate shrimp in 2021 (??)    Patient Measurements: Height: 5\' 6"  (167.6 cm) Weight: 87.6 kg (193 lb 2 oz) IBW/kg (Calculated) : 63.8 Heparin Dosing Weight: 82.1 kg   Vital Signs: Temp: 97.8 F (36.6 C) (09/08 1721) Temp Source: Temporal (09/08 1721) BP: 122/76 (09/08 1930) Pulse Rate: 78 (09/08 1930)  Labs: Recent Labs    06/18/20 1723 06/18/20 1945  HGB 15.4  --   HCT 47.0  --   PLT 288  --   CREATININE 1.25*  --   TROPONINIHS 55* 53*    Estimated Creatinine Clearance: 61.9 mL/min (A) (by C-G formula based on SCr of 1.25 mg/dL (H)).   Medical History: Past Medical History:  Diagnosis Date  . Hypertension     Medications:  Scheduled:  . heparin  5,000 Units Intravenous Once  . metoprolol tartrate  12.5 mg Oral BID  . nicotine  14 mg Transdermal Daily    Assessment: Patient is a 66 yom that presents with Afib with RVR not on anticoagulation PTA. Pharmacy has been asked to dose heparin at this time.   Goal of Therapy:  Heparin level 0.3-0.7 units/ml Monitor platelets by anticoagulation protocol: Yes   Plan:  - Heparin bolus 5000 units IV x 1 dose - Heparin drip @ 1200 units/hr - Heparin level in ~ 8 hours  - Monitor patient for s/s of bleeding and CBC while on heparin   Duanne Limerick PharmD. BCPS  06/18/2020,9:50 PM

## 2020-06-18 NOTE — ED Notes (Signed)
Assumed care on patient , respirations unlabored, denies pain , IV site intact , Cardizem drip infusing at 5 mg/hr.

## 2020-06-18 NOTE — ED Triage Notes (Signed)
Pt BIB [facility], went for a wellness check up today, at PCP pt noted to be in A-Fib RVR. HR 120. Pt reports feeling SOB for several months, increase SOB w/exertion. Pt had Covid in March      BP 120/70, 99/77 HR 150-170 98% RA  20g LAC
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
## 2020-06-19 ENCOUNTER — Inpatient Hospital Stay (HOSPITAL_COMMUNITY): Payer: Medicare Other

## 2020-06-19 DIAGNOSIS — I248 Other forms of acute ischemic heart disease: Secondary | ICD-10-CM

## 2020-06-19 DIAGNOSIS — I10 Essential (primary) hypertension: Secondary | ICD-10-CM

## 2020-06-19 DIAGNOSIS — I340 Nonrheumatic mitral (valve) insufficiency: Secondary | ICD-10-CM

## 2020-06-19 DIAGNOSIS — I4891 Unspecified atrial fibrillation: Secondary | ICD-10-CM

## 2020-06-19 LAB — ECHOCARDIOGRAM COMPLETE
Calc EF: 39.7 %
Height: 66 in
MV M vel: 4.15 m/s
MV Peak grad: 68.9 mmHg
Radius: 0.3 cm
S' Lateral: 4.6 cm
Single Plane A2C EF: 35.4 %
Single Plane A4C EF: 43.8 %
Weight: 3280 oz

## 2020-06-19 LAB — CBC
HCT: 44.9 % (ref 39.0–52.0)
Hemoglobin: 14.4 g/dL (ref 13.0–17.0)
MCH: 28.1 pg (ref 26.0–34.0)
MCHC: 32.1 g/dL (ref 30.0–36.0)
MCV: 87.5 fL (ref 80.0–100.0)
Platelets: 277 10*3/uL (ref 150–400)
RBC: 5.13 MIL/uL (ref 4.22–5.81)
RDW: 14.7 % (ref 11.5–15.5)
WBC: 6.5 10*3/uL (ref 4.0–10.5)
nRBC: 0 % (ref 0.0–0.2)

## 2020-06-19 LAB — BASIC METABOLIC PANEL
Anion gap: 10 (ref 5–15)
BUN: 19 mg/dL (ref 8–23)
CO2: 27 mmol/L (ref 22–32)
Calcium: 8.6 mg/dL — ABNORMAL LOW (ref 8.9–10.3)
Chloride: 104 mmol/L (ref 98–111)
Creatinine, Ser: 1.35 mg/dL — ABNORMAL HIGH (ref 0.61–1.24)
GFR calc Af Amer: >60 mL/min (ref >60)
GFR calc non Af Amer: 55 mL/min — ABNORMAL LOW (ref >60)
Glucose, Bld: 161 mg/dL — ABNORMAL HIGH (ref 70–99)
Potassium: 4 mmol/L (ref 3.5–5.1)
Sodium: 141 mmol/L (ref 135–145)

## 2020-06-19 LAB — HEPARIN LEVEL (UNFRACTIONATED): Heparin Unfractionated: 0.34 IU/mL (ref 0.30–0.70)

## 2020-06-19 LAB — LIPID PANEL
Cholesterol: 183 mg/dL (ref 0–200)
HDL: 58 mg/dL (ref >40)
LDL Cholesterol: 116 mg/dL — ABNORMAL HIGH (ref 0–99)
Total CHOL/HDL Ratio: 3.2 RATIO
Triglycerides: 45 mg/dL (ref <150)
VLDL: 9 mg/dL (ref 0–40)

## 2020-06-19 LAB — HEMOGLOBIN A1C
Hgb A1c MFr Bld: 5.8 % — ABNORMAL HIGH (ref 4.8–5.6)
Mean Plasma Glucose: 119.76 mg/dL

## 2020-06-19 LAB — TSH: TSH: 1.467 u[IU]/mL (ref 0.350–4.500)

## 2020-06-19 LAB — BRAIN NATRIURETIC PEPTIDE: B Natriuretic Peptide: 478.8 pg/mL — ABNORMAL HIGH (ref 0.0–100.0)

## 2020-06-19 LAB — TROPONIN I (HIGH SENSITIVITY): Troponin I (High Sensitivity): 44 ng/L — ABNORMAL HIGH (ref <18)

## 2020-06-19 MED ORDER — FUROSEMIDE 20 MG PO TABS
20.0000 mg | ORAL_TABLET | Freq: Every day | ORAL | Status: DC
  Administered 2020-06-19 – 2020-06-22 (×4): 20 mg via ORAL
  Filled 2020-06-19 (×4): qty 1

## 2020-06-19 MED ORDER — METOPROLOL TARTRATE 25 MG PO TABS
25.0000 mg | ORAL_TABLET | Freq: Two times a day (BID) | ORAL | Status: DC
  Administered 2020-06-19 – 2020-06-20 (×2): 25 mg via ORAL
  Filled 2020-06-19 (×2): qty 1

## 2020-06-19 MED ORDER — APIXABAN 5 MG PO TABS
5.0000 mg | ORAL_TABLET | Freq: Two times a day (BID) | ORAL | Status: DC
  Administered 2020-06-19 – 2020-06-22 (×7): 5 mg via ORAL
  Filled 2020-06-19 (×7): qty 1

## 2020-06-19 MED ORDER — APIXABAN 5 MG PO TABS: 5.0000 mg | ORAL_TABLET | Freq: Two times a day (BID) | ORAL | 3 refills | Status: DC

## 2020-06-19 MED ORDER — DILTIAZEM HCL 30 MG PO TABS
30.0000 mg | ORAL_TABLET | Freq: Four times a day (QID) | ORAL | Status: DC
  Administered 2020-06-19 (×2): 30 mg via ORAL
  Filled 2020-06-19 (×2): qty 1

## 2020-06-19 MED ORDER — SODIUM CHLORIDE 0.9 % IV SOLN: INTRAVENOUS | Status: DC

## 2020-06-19 MED FILL — ELIQUIS 5 MG TABLET: 5 | 30 days supply | Qty: 60 | Fill #0
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
## 2020-06-19 NOTE — Progress Notes (Signed)
  Echocardiogram 2D Echocardiogram has been performed.  Christopher Wilkins 06/19/2020, 2:19 PM

## 2020-06-19 NOTE — Progress Notes (Addendum)
    Echo reviewed with attending, and EF is down. Official read is pending. Given this finding will plan for inpatient TEE/DCCV tomorrow. Will have received 3 doses of Eliquis by that time. Procedure reviewed with patient and consent obtained.   Will add low dose lasix 20mg  daily and recheck BMET in am.   Signed, Reino Bellis, NP-C 06/19/2020, 3:21 PM Pager: [phone]

## 2020-06-19 NOTE — Progress Notes (Signed)
[place] for Heparin Indication: atrial fibrillation  Assessment: Patient is a 47 yom that presents with Afib with RVR not on anticoagulation PTA. Pharmacy has been asked to dose heparin at this time.  Initial heparin level 0.34 units/ml  Goal of Therapy:  Heparin level 0.3-0.7 units/ml Monitor platelets by anticoagulation protocol: Yes   Plan:  - Continue Heparin drip @ 1200 units/hr - Heparin level in ~ 6 hours  - Monitor patient for s/s of bleeding and CBC while on heparin   Thanks for allowing pharmacy to be a part of this patient's care.  Excell Seltzer, PharmD Clinical Pharmacist  06/19/2020,3:48 AM

## 2020-06-19 NOTE — Significant Event (Addendum)
HOSPITAL MEDICINE OVERNIGHT EVENT NOTE    Notified by nursing patient arriving to unit with SBP 83/57.    Chart reviewed, admitted for Afib.  Advised to temporarily hold diltiazem infusion.  Monitor BP's closely.  Will try to transition to scheduled short acting Diltiazem once BP improves.   Vernelle Emerald  MD [facility]   ADDENDUM (9/9 12:45AM):  Patient's SBP is now over 100 off Diltiazem infusion.  Will now transition to PO Diltiazem 30mg  Q6hrs.  Sherryll Burger [place]

## 2020-06-19 NOTE — Progress Notes (Signed)
ANTICOAGULATION CONSULT NOTE - Initial Consult  Pharmacy Consult for heparin >> apixaban Indication: atrial fibrillation  Allergies  Allergen Reactions  . Codeine Nausea And Vomiting  . Lisinopril Other (See Comments)    "Didn't do well in my body"  . Shellfish Allergy Other (See Comments)    Can tolerate shrimp in 2021 (??)    Patient Measurements: Height: 5\' 6"  (167.6 cm) Weight: 93 kg (205 lb) IBW/kg (Calculated) : 63.8  Vital Signs: Temp: 99.5 F (37.5 C) (09/09 0738) Temp Source: Oral (09/09 0738) BP: 109/89 (09/09 0738) Pulse Rate: 99 (09/09 0738)  Labs: Recent Labs    06/18/20 1723 06/18/20 1723 06/18/20 1945 06/18/20 2146 06/19/20 0120  HGB 15.4  --   --   --  14.4  HCT 47.0  --   --   --  44.9  PLT 288  --   --   --  277  HEPARINUNFRC  --   --   --   --  0.34  CREATININE 1.25*  --   --   --  1.35*  TROPONINIHS 55*   < > 53* 53* 44*   < > = values in this interval not displayed.    Estimated Creatinine Clearance: 59 mL/min (A) (by C-G formula based on SCr of 1.35 mg/dL (H)).   Assessment: 64 yo M with new onset atrial fibrillation with no AC PTA. Patient transitioned from heparin to apixaban with possible DCCV as outpatient in 3 weeks if still in afib. TEE is pending but suspect reduced EF given clinical picture.   Goal of Therapy:  Monitor platelets by anticoagulation protocol: Yes   Plan:  Stop heparin Start apixaban 5mg  BID  Patient education completed, AVS updated  Monitor for signs/symptoms of bleeding    Benetta Spar, PharmD, BCPS, BCCP Clinical Pharmacist  Please check AMION for all [place] phone numbers After 10:00 PM, call [place] [phone]

## 2020-06-19 NOTE — Consult Note (Addendum)
Cardiology Consultation:   Patient ID: RODDIE RIEGLER MRN: [id number]; DOB: May 29, 1956  Admit date: 06/18/2020 Date of Consult: 06/19/2020  Primary Care Provider: Rogers Blocker, MD [facility] Cardiologist: [place], [place] Electrophysiologist:  None    Patient Profile:   AMADOR BRADDY is a 64 y.o. male with a hx of HTN, obesity and tobacco use who is being seen today for the evaluation of Afib at the request of Dr. Dwyane Dee.  History of Present Illness:   Mr. Blyden is a 64 yo male with PMH noted above. Reports he has never been seen by cardiology in the past. No known history of CAD, or arrhythmias. Denies any hx of OSA. He noted about 2 weeks ago that he started to become short of breath with activity. When leaving from work, he walks up a hill. This use to take him about 5 minutes, but recently had been taking 15 minutes. He notes shortness of breath, but no chest pain or palpitations. He presented to his PCP office for a routine check up and EKG was notable for new onset Afib. EMS was called and he was brought to the ED for further management. In talking with patient he reports orthopnea and had increased his pillows to two at night to be able to sleep. Mild non-productive cough.   In the ED his labs showed stable electrolytes, Cr 1.25, hsTn 55>>53>>44, WBC 5.6, Hgb 15.4, BNP 478. CXR with mild vascular congestion. EKG showed Afib RVR with rates in the 130s. He was placed on a Dilt gtt and metoprolol on admission, along with IV heparin. Noted to become hypotensive overnight and Dilt gtt was stopped and transitioned to Dilt 30mg  q6hr with improvement. Remains in Afib with rate controlled at rest.    Past Medical History:  Diagnosis Date  . Hypertension     Past Surgical History:  Procedure Laterality Date  . CHOLECYSTECTOMY    . OTHER SURGICAL HISTORY     "Shot a couple of times"     Home Medications:  Prior to Admission medications   Medication Sig Start Date End Date  Taking? Authorizing Provider  amLODipine (NORVASC) 5 MG tablet Take 1 tablet (5 mg total) by mouth daily. 04/20/19  Yes Minor, Grace Bushy, NP  hydrochlorothiazide (MICROZIDE) 12.5 MG capsule Take 1 capsule (12.5 mg total) by mouth daily. 04/20/19  Yes Minor, Grace Bushy, NP  NON FORMULARY Take 1 tablet by mouth See admin instructions. Super Beta caplet/tablet(s)- Take 1 caplet/tablet by mouth once a day with food   Yes [provider]  amitriptyline (ELAVIL) 50 MG tablet Take 50 mg by mouth daily as needed. Patient not taking: Reported on 06/18/2020 02/08/18   [provider]  potassium chloride (KLOR-CON) 10 MEQ tablet Take 2 tablets (20 mEq total) by mouth daily for 2 days. Patient not taking: Reported on 06/18/2020 08/27/19 06/18/20  Delia Heady, PA-C  predniSONE (DELTASONE) 10 MG tablet Prednisone 10 mg tabs Take 4 tabs  daily with food x 4 days, then 3 tabs daily x 4 days, then 2 tabs daily x 4 days, then 1 tab daily x4 days then stop. #40 Patient not taking: Reported on 06/18/2020 04/20/19   Minor, Grace Bushy, NP  ranitidine (ZANTAC) 150 MG tablet Take 1 tablet (150 mg total) by mouth 2 (two) times daily. Patient not taking: Reported on 06/18/2020 04/07/18   Street, [place], PA-C  tamsulosin (FLOMAX) 0.4 MG CAPS capsule Take 0.4 mg by mouth daily. Patient not taking:  Reported on 06/18/2020 02/08/18   [provider]    Inpatient Medications: Scheduled Meds: . diltiazem  30 mg Oral Q6H  . metoprolol tartrate  12.5 mg Oral BID  . nicotine  14 mg Transdermal Daily   Continuous Infusions: . heparin 1,200 Units/hr (06/19/20 0300)   PRN Meds: acetaminophen, levalbuterol, ondansetron (ZOFRAN) IV  Allergies:    Allergies  Allergen Reactions  . Codeine Nausea And Vomiting  . Lisinopril Other (See Comments)    "Didn't do well in my body"  . Shellfish Allergy Other (See Comments)    Can tolerate shrimp in 2021 (??)    Social History:   Social History   Socioeconomic History    . Marital status: Single    Spouse name: Not on file  . Number of children: Not on file  . Years of education: Not on file  . Highest education level: Not on file  Occupational History  . Not on file  Tobacco Use  . Smoking status: Current Every Day Smoker    Packs/day: 0.25    Types: Cigarettes  . Smokeless tobacco: Never Used  Substance and Sexual Activity  . Alcohol use: Yes    Alcohol/week: 0.0 standard drinks    Comment: occasionally; once every 2 weeks  . Drug use: No  . Sexual activity: Not on file  Other Topics Concern  . Not on file  Social History Narrative  . Not on file   Social Determinants of Health   Financial Resource Strain:   . Difficulty of Paying Living Expenses: Not on file  Food Insecurity:   . Worried About Charity fundraiser in the Last Year: Not on file  . Ran Out of Food in the Last Year: Not on file  Transportation Needs:   . Lack of Transportation (Medical): Not on file  . Lack of Transportation (Non-Medical): Not on file  Physical Activity:   . Days of Exercise per Week: Not on file  . Minutes of Exercise per Session: Not on file  Stress:   . Feeling of Stress : Not on file  Social Connections:   . Frequency of Communication with Friends and Family: Not on file  . Frequency of Social Gatherings with Friends and Family: Not on file  . Attends Religious Services: Not on file  . Active Member of Clubs or Organizations: Not on file  . Attends Archivist Meetings: Not on file  . Marital Status: Not on file  Intimate Partner Violence:   . Fear of Current or Ex-Partner: Not on file  . Emotionally Abused: Not on file  . Physically Abused: Not on file  . Sexually Abused: Not on file    Family History:    Family History  Problem Relation Age of Onset  . Colon cancer Neg Hx      ROS:  Please see the history of present illness.   All other ROS reviewed and negative.     Physical Exam/Data:   Vitals:   06/19/20 0030  06/19/20 0101 06/19/20 0418 06/19/20 0738  BP: (!) 118/100 113/68 (!) 122/101 109/89  Pulse: 95 72 86 99  Resp:   18 20  Temp:   98.6 F (37 C) 99.5 F (37.5 C)  TempSrc:   Oral Oral  SpO2:   100% 97%  Weight:      Height:        Intake/Output Summary (Last 24 hours) at 06/19/2020 1001 Last data filed at 06/19/2020 0845 Gross per  24 hour  Intake 805.7 ml  Output 1700 ml  Net -894.3 ml   Last 3 Weights 06/19/2020 06/18/2020 04/20/2019  Weight (lbs) 205 lb 193 lb 2 oz 193 lb 2 oz  Weight (kg) 92.987 kg 87.6 kg 87.6 kg     Body mass index is 33.09 kg/m.  General:  Well nourished, well developed, in no acute distress HEENT: normal Lymph: no adenopathy Neck: no JVD Endocrine:  No thryomegaly Vascular: No carotid bruits; FA pulses 2+ bilaterally without bruits  Cardiac:  normal S1, S2; Irreg Irreg; no murmur  Lungs:  clear to auscultation bilaterally, no wheezing, rhonchi or rales  Abd: soft, nontender, no hepatomegaly  Ext: no edema Musculoskeletal:  No deformities, BUE and BLE strength normal and equal Skin: warm and dry  Neuro:  CNs 2-12 intact, no focal abnormalities noted Psych:  Normal affect   EKG:  The EKG was personally reviewed and demonstrates:  AFib RVR, rate 137 Telemetry:  Telemetry was personally reviewed and demonstrates:  Afib rate controlled 90s  Relevant CV Studies:  Echo: pending  Laboratory Data:  High Sensitivity Troponin:   Recent Labs  Lab 06/18/20 1723 06/18/20 1945 06/18/20 2146 06/19/20 0120  TROPONINIHS 55* 53* 53* 44*     Chemistry Recent Labs  Lab 06/18/20 1723 06/19/20 0120  NA 139 141  K 4.0 4.0  CL 106 104  CO2 23 27  GLUCOSE 100* 161*  BUN 22 19  CREATININE 1.25* 1.35*  CALCIUM 8.9 8.6*  GFRNONAA >60 55*  GFRAA >60 >60  ANIONGAP 10 10    No results for input(s): PROT, ALBUMIN, AST, ALT, ALKPHOS, BILITOT in the last 168 hours. Hematology Recent Labs  Lab 06/18/20 1723 06/19/20 0120  WBC 5.6 6.5  RBC 5.41 5.13  HGB  15.4 14.4  HCT 47.0 44.9  MCV 86.9 87.5  MCH 28.5 28.1  MCHC 32.8 32.1  RDW 14.4 14.7  PLT 288 277   BNP Recent Labs  Lab 06/18/20 1945 06/19/20 0120  BNP 444.2* 478.8*    DDimer No results for input(s): DDIMER in the last 168 hours.   Radiology/Studies:  DG Chest Portable 1 View  Result Date: 06/18/2020 CLINICAL DATA:  Shortness of breath EXAM: PORTABLE CHEST 1 VIEW COMPARISON:  August 27, 2019 FINDINGS: Heart size remains enlarged. There is mild vascular congestion without overt pulmonary edema. There is no pneumothorax. No large pleural effusion. No acute osseous abnormality. IMPRESSION: Cardiomegaly with mild vascular congestion. Electronically Signed   By: Constance Holster M.D.   On: 06/18/2020 17:43   { New York Heart Association (NYHA) Functional Class NYHA Class II  Assessment and Plan:   GRAM SIEDLECKI is a 64 y.o. male with a hx of HTN, obesity and tobacco use who is being seen today for the evaluation of Afib at the request of Dr. Dwyane Dee.  1. Afib RVR: new onset, suspect developed about 2 weeks ago when his shortness of breath began. HR noted in the 130s on admission. Improved with IV Dilt and lopressor but BP dropped and transitioned to Dilt 30mg  q6hr. Bp now stable. Placed on IV heparin on admission.  -- This patients CHA2DS2-VASc Score of at least 2 given CHF symptoms and HTN, therefore would recommend DOAC. Discussed with patient regarding implications. Would start Eliquis 5mg  BID  -- rates are controlled while at rest, given his brief hypotension seems reasonable to stop Dilt and titrate metoprolol to 25mg  BID as tolerated -- if EF is ok on echo would plan  for [facility] for 3 weeks and follow up in the office for possible outpatient DCCV if remains in Afib. If EF is down then potentially plan for inpatient TEE/DCCV.  -- would have him ambulate to determine if HR is controlled with activity.   2. CHF with pulmonary edema: BNP was elevated on admission, and CXR with  edema. Given IV lasix with improvement. 1.7L UOP -- echo pending  3. HTN: home medications include amlodipine and HCTZ which were held on admission. Recommendations for metoprolol as above.  4. Elevated Troponin: peak of 55 with low flat trend, not consistent with ACS. No reported chest pain.   5. Tobacco use: cessation advised  For questions or updates, please contact [place] Please consult [email or web address] for contact info under    Signed, Reino Bellis, NP  06/19/2020 10:01 AM    Patient seen and examined.  Agree with above documentation.  Mr. Don is a 64 year old male with a history of hypertension, tobacco use who we are consulted to see for evaluation of atrial fibrillation at the request of Dr Dwyane Dee.  Mr. Finklea reports that he began having shortness of breath with exertion about 2 weeks ago.  Would particularly notice it when walking up a hill.  He denies any shortness of breath at rest and denies any chest pain or palpitations.  He was seen by his PCP and the EKG noted atrial fibrillation, which was a new diagnosis.  He was sent to the ED for further evaluation.  In the ED, vital signs notable for BP 135/109, SPO2 98% on room air, pulse 137.  Labs notable for creatinine 1.25, potassium 4.0, magnesium 2.2, high-sensitivity troponin  55 > 53 > 53 > 44, BNP 444, hemoglobin 15.4, A1c 5.8, TSH 1.5.  Chest x-ray showed cardiomegaly with mild vascular congestion.  EKG showed atrial fibrillation, rate 137, PVC, LVH with repolarization abnormalities, QTC 479.  Started on p.o. metoprolol and diltiazem drip with improvement in heart rate.  Telemetry shows atrial fibrillation with rates 70s to 100s.  Developed soft BP, diltiazem drip was discontinued and started on p.o. diltiazem.  On exam, patient is alert and oriented, irregular rhythm, normal rate, no murmurs, lungs CTAB, no LE edema, + JVD.  For his new onset atrial fibrillation, he currently is rate controlled and appears asymptomatic.   Will consolidate rate controlling meds to p.o. metoprolol 25 mg twice daily.  Start Eliquis 5 mg twice daily for anticoagulation.  Recommend checking echocardiogram.  If no systolic dysfunction, then would plan to continue p.o. metoprolol and Eliquis and follow-up as outpatient in 3 weeks.  If remains in AF after 3 weeks of uninterrupted anticoagulation, would schedule DCCV.  If systolic dysfunction on echocardiogram, would favor TEE/DCCV tomorrow.  Donato Heinz, MD

## 2020-06-19 NOTE — Progress Notes (Signed)
   06/18/20 2352  Vitals  Temp 98 F (36.7 C)  Temp Source Oral  BP (!) 83/57  MAP (mmHg) 66  BP Location Right Arm  BP Method Automatic  Patient Position (if appropriate) Sitting  Pulse Rate 75  Resp 18  Level of Consciousness  Level of Consciousness Alert  MEWS COLOR  MEWS Score Color Green  Oxygen Therapy  SpO2 100 %  O2 Device Room Air  MEWS Score  MEWS Temp 0  MEWS Systolic 1  MEWS Pulse 0  MEWS RR 0  MEWS LOC 0  MEWS Score 1  Dr Marlyce Huge notified of BP readings. Orders to check BP manual now then q 30 minutes. Order to d/c Cardizem drip initiated. Will continue to monitor patient.

## 2020-06-19 NOTE — Discharge Instructions (Signed)
Information on my medicine - ELIQUIS (apixaban)  This medication education was reviewed with me or my healthcare representative as part of my discharge preparation.     Why was Eliquis prescribed for you? Eliquis was prescribed for you to reduce the risk of a blood clot forming that can cause a stroke if you have a medical condition called atrial fibrillation (a type of irregular heartbeat).  What do You need to know about Eliquis ? Take your Eliquis TWICE DAILY - one tablet in the morning and one tablet in the evening with or without food. If you have difficulty swallowing the tablet whole please discuss with your pharmacist how to take the medication safely.  Take Eliquis exactly as prescribed by your doctor and DO NOT stop taking Eliquis without talking to the doctor who prescribed the medication.  Stopping may increase your risk of developing a stroke.  Refill your prescription before you run out.  After discharge, you should have regular check-up appointments with your healthcare provider that is prescribing your Eliquis.  In the future your dose may need to be changed if your kidney function or weight changes by a significant amount or as you get older.  What do you do if you miss a dose? If you miss a dose, take it as soon as you remember on the same day and resume taking twice daily.  Do not take more than one dose of ELIQUIS at the same time to make up a missed dose.  Important Safety Information A possible side effect of Eliquis is bleeding. You should call your healthcare provider right away if you experience any of the following: ? Bleeding from an injury or your nose that does not stop. ? Unusual colored urine (red or dark brown) or unusual colored stools (red or black). ? Unusual bruising for unknown reasons. ? A serious fall or if you hit your head (even if there is no bleeding).  Some medicines may interact with Eliquis and might increase your risk of bleeding or  clotting while on Eliquis. To help avoid this, consult your healthcare provider or pharmacist prior to using any new prescription or non-prescription medications, including herbals, vitamins, non-steroidal anti-inflammatory drugs (NSAIDs) and supplements.  This website has more information on Eliquis (apixaban): http://www.eliquis.com/eliquis/home  =====================================================  Atrial Fibrillation    Atrial fibrillation is a type of heartbeat that is irregular or fast. If you have this condition, your heart beats without any order. This makes it hard for your heart to pump blood in a normal way. Atrial fibrillation may come and go, or it may become a long-lasting problem. If this condition is not treated, it can put you at higher risk for stroke, heart failure, and other heart problems.  What are the causes? This condition may be caused by diseases that damage the heart. They include:  High blood pressure.  Heart failure.  Heart valve disease.  Heart surgery. Other causes include:  Diabetes.  Thyroid disease.  Being overweight.  Kidney disease. Sometimes the cause is not known.  What increases the risk? You are more likely to develop this condition if:  You are older.  You smoke.  You exercise often and very hard.  You have a family history of this condition.  You are a man.  You use drugs.  You drink a lot of alcohol.  You have lung conditions, such as emphysema, pneumonia, or COPD.  You have sleep apnea.   What are the signs or symptoms? Common symptoms   of this condition include:  A feeling that your heart is beating very fast.  Chest pain or discomfort.  Feeling short of breath.  Suddenly feeling light-headed or weak.  Getting tired easily during activity.  Fainting.  Sweating. In some cases, there are no symptoms.  How is this treated? Treatment for this condition depends on underlying conditions and how you feel  when you have atrial fibrillation. They include: 1. Medicines to: ? Prevent blood clots. ? Treat heart rate or heart rhythm problems. 2. Using devices, such as a pacemaker, to correct heart rhythm problems. 3. Doing surgery to remove the part of the heart that sends bad signals. 4. Closing an area where clots can form in the heart (left atrial appendage). In some cases, your doctor will treat other underlying conditions.  Follow these instructions at home:  Medicines 1. Take over-the-counter and prescription medicines only as told by your doctor. 2. Do not take any new medicines without first talking to your doctor. 3. If you are taking blood thinners: ? Talk with your doctor before you take any medicines that have aspirin or NSAIDs, such as ibuprofen, in them. ? Take your medicine exactly as told by your doctor. Take it at the same time each day. ? Avoid activities that could hurt or bruise you. Follow instructions about how to prevent falls. ? Wear a bracelet that says you are taking blood thinners. Or, carry a card that lists what medicines you take. Lifestyle          Do not use any products that have nicotine or tobacco in them. These include cigarettes, e-cigarettes, and chewing tobacco. If you need help quitting, ask your doctor.  Eat heart-healthy foods. Talk with your doctor about the right eating plan for you.  Exercise regularly as told by your doctor.  Do not drink alcohol.  Lose weight if you are overweight.  Do not use drugs, including cannabis.  General instructions  If you have a condition that causes breathing to stop for a short period of time (apnea), treat it as told by your doctor.  Keep a healthy weight. Do not use diet pills unless your doctor says they are safe for you. Diet pills may make heart problems worse.  Keep all follow-up visits as told by your doctor. This is important.  Contact a doctor if:  You notice a change in the speed, rhythm,  or strength of your heartbeat.  You are taking a blood-thinning medicine and you get more bruising.  You get tired more easily when you move or exercise.  You have a sudden change in weight.  Get help right away if:    1. You have pain in your chest or your belly (abdomen). 2. You have trouble breathing. 3. You have side effects of blood thinners, such as blood in your vomit, poop (stool), or pee (urine), or bleeding that cannot stop. 4. You have any signs of a stroke. "BE FAST" is an easy way to remember the main warning signs: ? B - Balance. Signs are dizziness, sudden trouble walking, or loss of balance. ? E - Eyes. Signs are trouble seeing or a change in how you see. ? F - Face. Signs are sudden weakness or loss of feeling in the face, or the face or eyelid drooping on one side. ? A - Arms. Signs are weakness or loss of feeling in an arm. This happens suddenly and usually on one side of the body. ? S - Speech.   Signs are sudden trouble speaking, slurred speech, or trouble understanding what people say. ? T - Time. Time to call emergency services. Write down what time symptoms started. 5. You have other signs of a stroke, such as: ? A sudden, very bad headache with no known cause. ? Feeling like you may vomit (nausea). ? Vomiting. ? A seizure.  These symptoms may be an emergency. Do not wait to see if the symptoms will go away. Get medical help right away. Call your local emergency services (911 in the [place] Do not drive yourself to the hospital. Summary  Atrial fibrillation is a type of heartbeat that is irregular or fast.  You are at higher risk of this condition if you smoke, are older, have diabetes, or are overweight.  Follow your doctor's instructions about medicines, diet, exercise, and follow-up visits.  Get help right away if you have signs or symptoms of a stroke.  Get help right away if you cannot catch your breath, or you have chest pain or discomfort. This  information is not intended to replace advice given to you by your health care provider. Make sure you discuss any questions you have with your health care provider. Document Revised: 03/21/2019 Document Reviewed: 03/21/2019 Elsevier Patient Education  2020 [employer]vier Inc.     

## 2020-06-19 NOTE — Progress Notes (Signed)
   06/19/20 0101  Vitals  BP 113/68  MAP (mmHg) 79  BP Method Automatic  Pulse Rate 72  MEWS COLOR  MEWS Score Color Green  MEWS Score  MEWS Temp 0  MEWS Systolic 0  MEWS Pulse 0  MEWS RR 0  MEWS LOC 0  MEWS Score 0  PO Cardizem given

## 2020-06-19 NOTE — Progress Notes (Signed)
PROGRESS NOTE    Christopher Wilkins  RXV:[id number] DOB: Dec 15, 1955 DOA: 06/18/2020 PCP: Rogers Blocker, MD    Brief Narrative: 64 years old male with medical history significant for essential hypertension, tobacco use disorder, obesity presented to the ED with progressive shortness of breath for 2 weeks.  He went to see his PCP for yearly checkup found to have a new onset A. fib with RVR on EKG. Patient was sent to the ER.  Patient also reported associated dyspnea and orthopnea. Patient was admitted for new onset A. fib with RVR,  started on Cardizem and heparin drip which dropped his blood pressure,  Cardizem drip was discontinued and transitioned to p.o. Cardizem.  Cardiology consulted recommended to continue Eliquis for 3 weeks and outpatient follow up , heart rate controlled.  Cardizem was changed to metoprolol.  Awaiting echocardiogram.   Assessment & Plan:   Active Problems:   New onset atrial fibrillation (HCC)   New onset A. fib with RVR Presented with dyspnea for 2 weeks duration associated with orthopnea and nonproductive cough. Twelve-lead EKG showing A. fib rate of 137 Started on Cardizem drip in the ED, CHA2DS2-VASc score 2 with suspected acute CHF No recent head trauma, no recent bleeding, no prior history of CVA or TIA Started on heparin drip, follow results of 2D echo May need to switch to DOAC if A. fib is sustained. Start low-dose Lopressor 12.5 mg twice daily. Continue to closely monitor on telemetry Cardiology consulted, heart rate controlled Cardizem changed to metoprolol twice daily. Patient will need Eliquis for 3 weeks if remaining in A. Fib, may require cardioversion. Echocardiogram completed report pending.   Elevated troponin, suspect demand ischemia in the setting of A. fib with RVR Troponin S peaked at 55 No sign of acute ischemia on twelve-lead EKG  Mild pulmonary edema, suspect secondary to A. fib with RVR Independently reviewed chest x-ray done on  admission which shows cardiomegaly with mild increase in pulmonary vascularity suggestive of pulmonary edema BNP 478.0 Give low dose of IV Lasix 20 mg x 1. Follow results of 2D echo  Suspected acute CHF Presented with dyspnea, orthopnea, mild pulmonary edema with cardiomegaly on chest x-ray, trace edema in lower extremities bilaterally 2D echo report pending. Give 1 dose IV Lasix 20 mg  Essential hypertension Start low-dose Lopressor Hold off home amlodipine and HCTZ Monitor vital signs  Tobacco use disorder States he uses 1 pack of cigarette per week Tobacco cessation counseled on at bedside Nicotine patch   DVT prophylaxis: Heparin gtt Code Status: Full Family Communication:  No family at bed side. Disposition Plan:   Status is: Inpatient  Remains inpatient appropriate because:Ongoing diagnostic testing needed not appropriate for outpatient work up   Dispo:  Patient From: Home  Planned Disposition: Home  Expected discharge date: 06/20/20  Medically stable for discharge: No   Consultants:   cardiology  Procedures: Echocardiogram.  Antimicrobials:  Anti-infectives (From admission, onward)   None      Subjective: Patient was seen and examined at bedside.  Overnight events noted.  Patient reports feeling better.  Denies any chest pain, palpitations,  dizziness or shortness of breath.  He asks when he can be discharged.  Objective: Vitals:   06/19/20 0101 06/19/20 0418 06/19/20 0738 06/19/20 1152  BP: 113/68 (!) 122/101 109/89 (!) 108/95  Pulse: 72 86 99 60  Resp:  18 20 20   Temp:  98.6 F (37 C) 99.5 F (37.5 C) 98.4 F (36.9 C)  TempSrc:  Oral Oral Oral  SpO2:  100% 97% 100%  Weight:      Height:        Intake/Output Summary (Last 24 hours) at 06/19/2020 1501 Last data filed at 06/19/2020 1350 Gross per 24 hour  Intake 1265.7 ml  Output 2300 ml  Net -1034.3 ml   Filed Weights   06/18/20 1723 06/19/20 0001  Weight: 87.6 kg 93 kg     Examination:  General exam: Appears calm and comfortable  Respiratory system: Clear to auscultation. Respiratory effort normal. Cardiovascular system: S1 & S2 heard, RRR. No JVD, murmurs, rubs, gallops or clicks. No pedal edema. Gastrointestinal system: Abdomen is nondistended, soft and nontender. No organomegaly or masses felt. Normal bowel sounds heard. Central nervous system: Alert and oriented. No focal neurological deficits. Extremities: No leg edema, no cyanosis no clubbing. Skin: No rashes, lesions or ulcers Psychiatry: Judgement and insight appear normal. Mood & affect appropriate.     Data Reviewed: I have personally reviewed following labs and imaging studies  CBC: Recent Labs  Lab 06/18/20 1723 06/19/20 0120  WBC 5.6 6.5  HGB 15.4 14.4  HCT 47.0 44.9  MCV 86.9 87.5  PLT 288 782   Basic Metabolic Panel: Recent Labs  Lab 06/18/20 1723 06/19/20 0120  NA 139 141  K 4.0 4.0  CL 106 104  CO2 23 27  GLUCOSE 100* 161*  BUN 22 19  CREATININE 1.25* 1.35*  CALCIUM 8.9 8.6*  MG 2.2  --    GFR: Estimated Creatinine Clearance: 59 mL/min (A) (by C-G formula based on SCr of 1.35 mg/dL (H)). Liver Function Tests: No results for input(s): AST, ALT, ALKPHOS, BILITOT, PROT, ALBUMIN in the last 168 hours. No results for input(s): LIPASE, AMYLASE in the last 168 hours. No results for input(s): AMMONIA in the last 168 hours. Coagulation Profile: No results for input(s): INR, PROTIME in the last 168 hours. Cardiac Enzymes: No results for input(s): CKTOTAL, CKMB, CKMBINDEX, TROPONINI in the last 168 hours. BNP (last 3 results) No results for input(s): PROBNP in the last 8760 hours. HbA1C: Recent Labs    06/19/20 0120  HGBA1C 5.8*   CBG: No results for input(s): GLUCAP in the last 168 hours. Lipid Profile: Recent Labs    06/19/20 0120  CHOL 183  HDL 58  LDLCALC 116*  TRIG 45  CHOLHDL 3.2   Thyroid Function Tests: Recent Labs    06/19/20 0120  TSH  1.467   Anemia Panel: No results for input(s): VITAMINB12, FOLATE, FERRITIN, TIBC, IRON, RETICCTPCT in the last 72 hours. Sepsis Labs: No results for input(s): PROCALCITON, LATICACIDVEN in the last 168 hours.  Recent Results (from the past 240 hour(s))  SARS Coronavirus 2 by RT PCR (hospital order, performed in [facility] hospital lab) Nasopharyngeal Nasopharyngeal Swab     Status: None   Collection Time: 06/18/20  7:13 PM   Specimen: Nasopharyngeal Swab  Result Value Ref Range Status   SARS Coronavirus 2 NEGATIVE NEGATIVE Final    Comment: (NOTE) SARS-CoV-2 target nucleic acids are NOT DETECTED.  The SARS-CoV-2 RNA is generally detectable in upper and lower respiratory specimens during the acute phase of infection. The lowest concentration of SARS-CoV-2 viral copies this assay can detect is 250 copies / mL. A negative result does not preclude SARS-CoV-2 infection and should not be used as the sole basis for treatment or other patient management decisions.  A negative result may occur with improper specimen collection / handling, submission of specimen other than nasopharyngeal swab,  presence of viral mutation(s) within the areas targeted by this assay, and inadequate number of viral copies (<250 copies / mL). A negative result must be combined with clinical observations, patient history, and epidemiological information.  Fact Sheet for Patients:   StrictlyIdeas.no  Fact Sheet for Healthcare Providers: BankingDealers.co.za  This test is not yet approved or  cleared by the [place] FDA and has been authorized for detection and/or diagnosis of SARS-CoV-2 by FDA under an Emergency Use Authorization (EUA).  This EUA will remain in effect (meaning this test can be used) for the duration of the COVID-19 declaration under Section 564(b)(1) of the Act, 21 U.S.C. section 360bbb-3(b)(1), unless the authorization is terminated  or revoked sooner.  Performed at [facility], [place] [address], [place], [place] [address]     Radiology Studies: DG Chest Portable 1 View  Result Date: 06/18/2020 CLINICAL DATA:  Shortness of breath EXAM: PORTABLE CHEST 1 VIEW COMPARISON:  August 27, 2019 FINDINGS: Heart size remains enlarged. There is mild vascular congestion without overt pulmonary edema. There is no pneumothorax. No large pleural effusion. No acute osseous abnormality. IMPRESSION: Cardiomegaly with mild vascular congestion. Electronically Signed   By: Constance Holster M.D.   On: 06/18/2020 17:43   Scheduled Meds: . apixaban  5 mg Oral BID  . metoprolol tartrate  25 mg Oral BID  . nicotine  14 mg Transdermal Daily   Continuous Infusions:   LOS: 1 day    Time spent: 25 mins.    Shawna Clamp, MD [facility]   If 7PM-7AM, please contact night-coverage

## 2020-06-20 ENCOUNTER — Encounter (HOSPITAL_COMMUNITY): Payer: Self-pay | Admitting: Internal Medicine

## 2020-06-20 ENCOUNTER — Inpatient Hospital Stay (HOSPITAL_COMMUNITY): Payer: Medicare Other | Admitting: Anesthesiology

## 2020-06-20 ENCOUNTER — Inpatient Hospital Stay (HOSPITAL_COMMUNITY): Payer: Medicare Other

## 2020-06-20 ENCOUNTER — Encounter (HOSPITAL_COMMUNITY)
Admission: EM | Disposition: A | Discharge status: Home / Self Care | Payer: Self-pay | Source: Home / Self Care | Attending: Family Medicine

## 2020-06-20 DIAGNOSIS — I4891 Unspecified atrial fibrillation: Secondary | ICD-10-CM

## 2020-06-20 DIAGNOSIS — I340 Nonrheumatic mitral (valve) insufficiency: Secondary | ICD-10-CM

## 2020-06-20 DIAGNOSIS — I5041 Acute combined systolic (congestive) and diastolic (congestive) heart failure: Secondary | ICD-10-CM

## 2020-06-20 HISTORY — PX: TEE WITHOUT CARDIOVERSION: SHX5443

## 2020-06-20 HISTORY — PX: CARDIOVERSION: SHX1299

## 2020-06-20 LAB — BASIC METABOLIC PANEL
Anion gap: 7 (ref 5–15)
BUN: 15 mg/dL (ref 8–23)
CO2: 27 mmol/L (ref 22–32)
Calcium: 8.8 mg/dL — ABNORMAL LOW (ref 8.9–10.3)
Chloride: 106 mmol/L (ref 98–111)
Creatinine, Ser: 1.34 mg/dL — ABNORMAL HIGH (ref 0.61–1.24)
GFR calc Af Amer: >60 mL/min (ref >60)
GFR calc non Af Amer: 56 mL/min — ABNORMAL LOW (ref >60)
Glucose, Bld: 107 mg/dL — ABNORMAL HIGH (ref 70–99)
Potassium: 4 mmol/L (ref 3.5–5.1)
Sodium: 140 mmol/L (ref 135–145)

## 2020-06-20 LAB — CBC
HCT: 46.1 % (ref 39.0–52.0)
Hemoglobin: 14.8 g/dL (ref 13.0–17.0)
MCH: 28.1 pg (ref 26.0–34.0)
MCHC: 32.1 g/dL (ref 30.0–36.0)
MCV: 87.5 fL (ref 80.0–100.0)
Platelets: 267 10*3/uL (ref 150–400)
RBC: 5.27 MIL/uL (ref 4.22–5.81)
RDW: 14.4 % (ref 11.5–15.5)
WBC: 4.9 10*3/uL (ref 4.0–10.5)
nRBC: 0 % (ref 0.0–0.2)

## 2020-06-20 LAB — PROTIME-INR
INR: 1.3 — ABNORMAL HIGH (ref 0.8–1.2)
Prothrombin Time: 15.2 seconds (ref 11.4–15.2)

## 2020-06-20 SURGERY — ECHOCARDIOGRAM, TRANSESOPHAGEAL
Anesthesia: General

## 2020-06-20 MED ORDER — PROPOFOL 500 MG/50ML IV EMUL
INTRAVENOUS | Status: DC | PRN
  Administered 2020-06-20: 100 ug/kg/min via INTRAVENOUS

## 2020-06-20 MED ORDER — SODIUM CHLORIDE 0.9 % IV SOLN
INTRAVENOUS | Status: AC | PRN
  Administered 2020-06-20: 500 mL via INTRAVENOUS

## 2020-06-20 MED ORDER — METOPROLOL TARTRATE 25 MG PO TABS
25.0000 mg | ORAL_TABLET | Freq: Two times a day (BID) | ORAL | Status: AC
  Administered 2020-06-20: 25 mg via ORAL
  Filled 2020-06-20: qty 1

## 2020-06-20 MED ORDER — LIDOCAINE 2% (20 MG/ML) 5 ML SYRINGE
INTRAMUSCULAR | Status: DC | PRN
  Administered 2020-06-20: 60 mg via INTRAVENOUS
  Administered 2020-06-20: 40 mg via INTRAVENOUS

## 2020-06-20 MED ORDER — METOPROLOL SUCCINATE ER 50 MG PO TB24
50.0000 mg | ORAL_TABLET | Freq: Every day | ORAL | Status: DC
  Administered 2020-06-21 – 2020-06-22 (×2): 50 mg via ORAL
  Filled 2020-06-20 (×2): qty 1

## 2020-06-20 NOTE — Anesthesia Preprocedure Evaluation (Signed)
Anesthesia Evaluation  Patient identified by MRN, date of birth, ID band Patient awake    Reviewed: Allergy & Precautions, NPO status , Patient's Chart, lab work & pertinent test results  History of Anesthesia Complications Negative for: history of anesthetic complications  Airway Mallampati: II  TM Distance: >3 FB Neck ROM: Full    Dental  (+) Edentulous Upper, Dental Advisory Given, Missing,    Pulmonary shortness of breath, sleep apnea , neg COPD, neg recent URI, Current Smoker,  Covid-19 Nucleic Acid Test Results Lab Results      Component                Value               Date                      SARSCOV2NAA              NEGATIVE            06/18/2020                [place]              NEGATIVE            08/27/2019                [place]              NEGATIVE            04/19/2019              breath sounds clear to auscultation       Cardiovascular hypertension, Pt. on medications (-) angina(-) Past MI + dysrhythmias Atrial Fibrillation  Rhythm:Irregular  1. Left ventricular ejection fraction, by estimation, is 35 to 40%. The  left ventricle has moderately decreased function. The left ventricle  demonstrates global hypokinesis. The left ventricular internal cavity size  was moderately dilated. Left  ventricular diastolic function could not be evaluated. There is severe  hypokinesis of the left ventricular, entire anteroseptal wall and anterior  wall.  2. Right ventricular systolic function is low normal. The right  ventricular size is normal. Tricuspid regurgitation signal is inadequate  for assessing PA pressure.  3. Left atrial size was severely dilated.  4. Right atrial size was mildly dilated.  5. Restricted posterior leaflet with heavy calcification of annular and  posteromedial papillary muscles. The mitral valve is abnormal. Mild mitral  valve regurgitation. Moderate mitral annular calcification.    6. The aortic valve is calcified. There is moderate calcification of the  aortic valve. There is moderate thickening of the aortic valve. Aortic  valve regurgitation is not visualized.  7. The inferior vena cava is dilated in size with >50% respiratory  variability, suggesting right atrial pressure of 8 mmHg.    Neuro/Psych negative neurological ROS  negative psych ROS   GI/Hepatic   Endo/Other  negative endocrine ROS  Renal/GU Renal diseaseLab Results      Component                Value               Date                      CREATININE               1.34 (H)  06/20/2020           Lab Results      Component                Value               Date                      K                        4.0                 06/20/2020                Musculoskeletal negative musculoskeletal ROS (+)   Abdominal   Peds  Hematology negative hematology ROS (+)   Anesthesia Other Findings   Reproductive/Obstetrics                             Anesthesia Physical Anesthesia Plan  ASA: II  Anesthesia Plan: General   Post-op Pain Management:    Induction: Intravenous  PONV Risk Score and Plan: 1 and Treatment may vary due to age or medical condition and Propofol infusion  Airway Management Planned: Nasal Cannula  Additional Equipment: None  Intra-op Plan:   Post-operative Plan:   Informed Consent: I have reviewed the patients History and Physical, chart, labs and discussed the procedure including the risks, benefits and alternatives for the proposed anesthesia with the patient or authorized representative who has indicated his/her understanding and acceptance.     Dental advisory given  Plan Discussed with: CRNA and Surgeon  Anesthesia Plan Comments:         Anesthesia Quick Evaluation

## 2020-06-20 NOTE — Progress Notes (Signed)
Progress Note  Patient Name: Christopher Wilkins Date of Encounter: 06/20/2020  [facility] Cardiologist: No primary care provider on file.   Subjective   Denies any chest pain or dyspnea  Inpatient Medications    Scheduled Meds: . apixaban  5 mg Oral BID  . furosemide  20 mg Oral Daily  . metoprolol tartrate  25 mg Oral BID  . nicotine  14 mg Transdermal Daily   Continuous Infusions: . sodium chloride 20 mL/hr at 06/20/20 0500   PRN Meds: acetaminophen, levalbuterol, ondansetron (ZOFRAN) IV   Vital Signs    Vitals:   06/19/20 2044 06/20/20 0101 06/20/20 0429 06/20/20 0818  BP: 110/82 (!) 114/102 (!) 116/103 (!) 119/107  Pulse: 83 (!) 48 62   Resp: 18 18 18 18   Temp: 98.9 F (37.2 C) 98.3 F (36.8 C) 98 F (36.7 C) 98.1 F (36.7 C)  TempSrc: Oral Oral Oral Oral  SpO2: 99% 100% 100% 98%  Weight:   93.8 kg   Height:        Intake/Output Summary (Last 24 hours) at 06/20/2020 0856 Last data filed at 06/20/2020 0500 Gross per 24 hour  Intake 521.16 ml  Output 500 ml  Net 21.16 ml   Last 3 Weights 06/20/2020 06/19/2020 06/18/2020  Weight (lbs) 206 lb 12.8 oz 205 lb 193 lb 2 oz  Weight (kg) 93.804 kg 92.987 kg 87.6 kg      Telemetry    AF rate 100-120s - Personally Reviewed  ECG    No new ECG - Personally Reviewed  Physical Exam   GEN: No acute distress.   Neck: No JVD Cardiac: irregular, tachycardic, 2/6 systolic murmur Respiratory: Clear to auscultation bilaterally. GI: Soft, nontender, non-distended  MS: No edema; No deformity. Neuro:  Nonfocal  Psych: Normal affect   Labs    High Sensitivity Troponin:   Recent Labs  Lab 06/18/20 1723 06/18/20 1945 06/18/20 2146 06/19/20 0120  TROPONINIHS 55* 53* 53* 44*      Chemistry Recent Labs  Lab 06/18/20 1723 06/19/20 0120 06/20/20 0556  NA 139 141 140  K 4.0 4.0 4.0  CL 106 104 106  CO2 23 27 27   GLUCOSE 100* 161* 107*  BUN 22 19 15   CREATININE 1.25* 1.35* 1.34*  CALCIUM 8.9 8.6* 8.8*    GFRNONAA >60 55* 56*  GFRAA >60 >60 >60  ANIONGAP 10 10 7      Hematology Recent Labs  Lab 06/18/20 1723 06/19/20 0120 06/20/20 0556  WBC 5.6 6.5 4.9  RBC 5.41 5.13 5.27  HGB 15.4 14.4 14.8  HCT 47.0 44.9 46.1  MCV 86.9 87.5 87.5  MCH 28.5 28.1 28.1  MCHC 32.8 32.1 32.1  RDW 14.4 14.7 14.4  PLT 288 277 267    BNP Recent Labs  Lab 06/18/20 1945 06/19/20 0120  BNP 444.2* 478.8*     DDimer No results for input(s): DDIMER in the last 168 hours.   Radiology    DG Chest Portable 1 View  Result Date: 06/18/2020 CLINICAL DATA:  Shortness of breath EXAM: PORTABLE CHEST 1 VIEW COMPARISON:  August 27, 2019 FINDINGS: Heart size remains enlarged. There is mild vascular congestion without overt pulmonary edema. There is no pneumothorax. No large pleural effusion. No acute osseous abnormality. IMPRESSION: Cardiomegaly with mild vascular congestion. Electronically Signed   By: Constance Holster M.D.   On: 06/18/2020 17:43   ECHOCARDIOGRAM COMPLETE  Result Date: 06/19/2020    ECHOCARDIOGRAM REPORT   Patient Name:   Christopher Wilkins  [place] Date of Exam: 06/19/2020 Medical Rec #:  [id number]     Height:       66.0 in Accession #:    [id number]    Weight:       205.0 lb Date of Birth:  1956/01/18     BSA:          2.021 m Patient Age:    64 years      BP:           109/89 mmHg Patient Gender: M             HR:           88 bpm. Exam Location:  Inpatient Procedure: 2D Echo, Cardiac Doppler and Color Doppler Indications:    Atrial Fibrillation 427.31 / I48.91  History:        Patient has no prior history of Echocardiogram examinations.                 Arrythmias:Atrial Fibrillation; Risk Factors:Hypertension.  Sonographer:    Bernadene Person RDCS Referring Phys: [id number] [place]  1. Left ventricular ejection fraction, by estimation, is 35 to 40%. The left ventricle has moderately decreased function. The left ventricle demonstrates global hypokinesis. The left ventricular internal cavity size  was moderately dilated. Left ventricular diastolic function could not be evaluated. There is severe hypokinesis of the left ventricular, entire anteroseptal wall and anterior wall.  2. Right ventricular systolic function is low normal. The right ventricular size is normal. Tricuspid regurgitation signal is inadequate for assessing PA pressure.  3. Left atrial size was severely dilated.  4. Right atrial size was mildly dilated.  5. Restricted posterior leaflet with heavy calcification of annular and posteromedial papillary muscles. The mitral valve is abnormal. Mild mitral valve regurgitation. Moderate mitral annular calcification.  6. The aortic valve is calcified. There is moderate calcification of the aortic valve. There is moderate thickening of the aortic valve. Aortic valve regurgitation is not visualized.  7. The inferior vena cava is dilated in size with >50% respiratory variability, suggesting right atrial pressure of 8 mmHg. Comparison(s): No prior Echocardiogram. FINDINGS  Left Ventricle: Both global and focal wall motion abnormalities as noted. Left ventricular ejection fraction, by estimation, is 35 to 40%. The left ventricle has moderately decreased function. The left ventricle demonstrates global hypokinesis. Severe hypokinesis of the left ventricular, entire anteroseptal wall and anterior wall. The left ventricular internal cavity size was moderately dilated. There is no left ventricular hypertrophy. Left ventricular diastolic function could not be evaluated due to  atrial fibrillation. Left ventricular diastolic function could not be evaluated. Right Ventricle: The right ventricular size is normal. Right vetricular wall thickness was not assessed. Right ventricular systolic function is low normal. Tricuspid regurgitation signal is inadequate for assessing PA pressure. Left Atrium: Left atrial size was severely dilated. Right Atrium: Right atrial size was mildly dilated. Pericardium: Trivial  pericardial effusion is present. Mitral Valve: Restricted posterior leaflet with heavy calcification of annular and posteromedial papillary muscles. The mitral valve is abnormal. Severely decreased mobility of the mitral valve leaflets. Moderate mitral annular calcification. Mild mitral  valve regurgitation. Tricuspid Valve: The tricuspid valve is normal in structure. Tricuspid valve regurgitation is trivial. No evidence of tricuspid stenosis. Aortic Valve: Likely tricuspid, but significantly calcified. The aortic valve is calcified. There is moderate calcification of the aortic valve. There is moderate thickening of the aortic valve. Aortic valve regurgitation is not visualized. Pulmonic Valve: The pulmonic valve was grossly  normal. Pulmonic valve regurgitation is not visualized. Aorta: The aortic root and ascending aorta are structurally normal, with no evidence of dilitation. Venous: The inferior vena cava is dilated in size with greater than 50% respiratory variability, suggesting right atrial pressure of 8 mmHg. IAS/Shunts: The atrial septum is grossly normal.  LEFT VENTRICLE PLAX 2D LVIDd:         6.00 cm LVIDs:         4.60 cm LV PW:         1.00 cm LV IVS:        0.80 cm LVOT diam:     2.00 cm LV SV:         56 LV SV Index:   28 LVOT Area:     3.14 cm  LV Volumes (MOD) LV vol d, MOD A2C: 147.0 ml LV vol d, MOD A4C: 163.0 ml LV vol s, MOD A2C: 94.9 ml LV vol s, MOD A4C: 91.6 ml LV SV MOD A2C:     52.1 ml LV SV MOD A4C:     163.0 ml LV SV MOD BP:      61.7 ml RIGHT VENTRICLE TAPSE (M-mode): 1.3 cm LEFT ATRIUM              Index       RIGHT ATRIUM           Index LA diam:        4.50 cm  2.23 cm/m  RA Area:     18.80 cm LA Vol (A2C):   150.0 ml 74.22 ml/m RA Volume:   50.90 ml  25.19 ml/m LA Vol (A4C):   182.0 ml 90.06 ml/m LA Biplane Vol: 168.0 ml 83.13 ml/m  AORTIC VALVE LVOT Vmax:   98.25 cm/s LVOT Vmean:  71.750 cm/s LVOT VTI:    0.180 m  AORTA Ao Root diam: 3.50 cm Ao Asc diam:  2.90 cm MR Peak  grad:    68.9 mmHg MR Mean grad:    42.0 mmHg   SHUNTS MR Vmax:         415.00 cm/s Systemic VTI:  0.18 m MR Vmean:        307.0 cm/s  Systemic Diam: 2.00 cm MR PISA:         0.57 cm MR PISA Eff ROA: 5 mm MR PISA Radius:  0.30 cm Buford Dresser MD Electronically signed by Buford Dresser MD Signature Date/Time: 06/19/2020/5:45:50 PM    Final     Cardiac Studies   Echo 06/19/20: 1. Left ventricular ejection fraction, by estimation, is 35 to 40%. The  left ventricle has moderately decreased function. The left ventricle  demonstrates global hypokinesis. The left ventricular internal cavity size  was moderately dilated. Left  ventricular diastolic function could not be evaluated. There is severe  hypokinesis of the left ventricular, entire anteroseptal wall and anterior  wall.  2. Right ventricular systolic function is low normal. The right  ventricular size is normal. Tricuspid regurgitation signal is inadequate  for assessing PA pressure.  3. Left atrial size was severely dilated.  4. Right atrial size was mildly dilated.  5. Restricted posterior leaflet with heavy calcification of annular and  posteromedial papillary muscles. The mitral valve is abnormal. Mild mitral  valve regurgitation. Moderate mitral annular calcification.  6. The aortic valve is calcified. There is moderate calcification of the  aortic valve. There is moderate thickening of the aortic valve. Aortic  valve regurgitation is not visualized.  7. The inferior  vena cava is dilated in size with >50% respiratory  variability, suggesting right atrial pressure of 8 mmHg.   Patient Profile     64 y.o. male with a hx of HTN, obesity and tobacco use who is being seen today for the evaluation of Afib at the request of Dr. Dwyane Dee.  Assessment & Plan    Afib RVR: new onset, suspect developed about 2 weeks ago when his shortness of breath began. HR noted in the 130s on admission. 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 -Started on metoprolol 25 mg twice daily, will consolidate to XL on discharge -Plan to start losartan if renal function stable -Continue Lasix 20 mg daily  HTN: home medications include amlodipine and HCTZ which were held on admission. Recommendations for metoprolol as above.  Plan to add losartan if stable renal function.  Elevated Troponin: peak of 55 with low flat trend, not consistent with ACS. No reported chest pain.   Tobacco use: cessation advised  For questions or updates, please contact [place] Please consult [email or web address] for contact info under        Signed, Donato Heinz, MD  06/20/2020, 8:56 AM

## 2020-06-20 NOTE — Transfer of Care (Signed)
Immediate Anesthesia Transfer of Care Note  Patient: Christopher Wilkins  Procedure(s) Performed: TRANSESOPHAGEAL ECHOCARDIOGRAM (TEE) (N/A ) CARDIOVERSION (N/A )  Patient Location: Endoscopy Unit  Anesthesia Type:MAC  Level of Consciousness: awake  Airway & Oxygen Therapy: Patient Spontanous Breathing and Patient connected to nasal cannula oxygen  Post-op Assessment: Report given to RN and Post -op Vital signs reviewed and stable  Post vital signs: Reviewed and stable  Last Vitals:  Vitals Value Taken Time  BP 96/71 06/20/20 1211  Temp 36.2 C 06/20/20 1210  Pulse 71 06/20/20 1214  Resp 18 06/20/20 1214  SpO2 95 % 06/20/20 1214  Vitals shown include unvalidated device data.  Last Pain:  Vitals:   06/20/20 1210  TempSrc: Temporal  PainSc: 0-No pain         Complications: No complications documented.

## 2020-06-20 NOTE — Progress Notes (Signed)
PROGRESS NOTE    Christopher Wilkins  IZT:[id number] DOB: 10/26/1955 DOA: 06/18/2020 PCP: Rogers Blocker, MD    Brief Narrative: 64 years old male with medical history significant for essential hypertension, tobacco use disorder, obesity presented to the ED with progressive shortness of breath for 2 weeks.  He went to see his PCP for yearly checkup found to have a new onset A. fib with RVR on EKG. Patient was sent to the ER.  Patient also reported associated dyspnea and orthopnea. Patient was admitted for new onset A. fib with RVR,  started on Cardizem and heparin drip which dropped his blood pressure,  Cardizem drip was discontinued and transitioned to p.o. Cardizem.  Cardiology consulted recommended to continue Eliquis for 3 weeks and outpatient follow up , heart rate controlled.  Cardizem was changed to metoprolol.  Echocardiogram shows EF 35 to 40%.  Patient underwent TEE and successful cardioversion for A. fib.  Awaiting cardiology recommendation for discharge.   Assessment & Plan:   Active Problems:   Atrial fibrillation with RVR (HCC)   New onset A. fib with RVR Presented with dyspnea for 2 weeks duration associated with orthopnea and nonproductive cough. Twelve-lead EKG showing A. fib rate of 137 Started on Cardizem drip in the ED, CHA2DS2-VASc score 2 with suspected acute CHF No recent head trauma, no recent bleeding, no prior history of CVA or TIA Started on heparin drip, follow results of 2D echo May need to switch to DOAC if A. fib is sustained. Start low-dose Lopressor 12.5 mg twice daily. Continue to closely monitor on telemetry Cardiology consulted, heart rate controlled Cardizem changed to metoprolol twice daily. Patient will need Eliquis for 3 weeks if remaining in A. Fib, may require cardioversion. Echocardiogram shows EF 35 to 40%.   Patient underwent TEE and successful cardioversion for A. fib.   Awaiting cardiology recommendation for discharge.   Elevated troponin,  suspect demand ischemia in the setting of A. fib with RVR Troponin S peaked at 55 No sign of acute ischemia on twelve-lead EKG  Mild pulmonary edema, suspect secondary to A. fib with RVR Independently reviewed chest x-ray done on admission which shows cardiomegaly with mild increase in pulmonary vascularity suggestive of pulmonary edema BNP 478.0 Give low dose of IV Lasix 20 mg x 1. Echo : EF 35 to 40% consistent with systolic CHF. Needs ACEI or ARB when renal fx better  Suspected acute CHF Presented with dyspnea, orthopnea, mild pulmonary edema with cardiomegaly on chest x-ray, trace edema in lower extremities bilaterally 2D echo LV EF 35 to 40% consistent with systolic CHF. Give 1 dose IV Lasix 20 mg  Essential hypertension Started  low-dose Lopressor Hold off home amlodipine and HCTZ Monitor vital signs  Tobacco use disorder States he uses 1 pack of cigarette per week Tobacco cessation counseled on at bedside Nicotine patch   DVT prophylaxis: Eliquis  Code Status: Full Family Communication:  No family at bed side. Disposition Plan:   Status is: Inpatient  Remains inpatient appropriate because:Ongoing diagnostic testing needed not appropriate for outpatient work up   Dispo:  Patient From: Home  Planned Disposition: Home  Expected discharge date: 06/21/20  Medically stable for discharge: No   Consultants:   cardiology  Procedures: Echocardiogram.  Antimicrobials:  Anti-infectives (From admission, onward)   None      Subjective: Patient was seen and examined at bedside.  Overnight events noted.  Patient denies any chest pain, shortness of breath.  He is anticipating a TEE  and cardioversion ,  asks can he be discharged after the procedure if it is normal.  Objective: Vitals:   06/20/20 1251 06/20/20 1316 06/20/20 1330 06/20/20 1500  BP: 113/87 (!) 110/91 (!) 117/92 123/89  Pulse: 72 78 75 80  Resp: 16 20 18 20   Temp: 97.8 F (36.6 C) 98 F (36.7  C)  98 F (36.7 C)  TempSrc: Oral Oral    SpO2: 97% 91% (!) 87% 100%  Weight:      Height:        Intake/Output Summary (Last 24 hours) at 06/20/2020 1605 Last data filed at 06/20/2020 1215 Gross per 24 hour  Intake 501.16 ml  Output 200 ml  Net 301.16 ml   Filed Weights   06/18/20 1723 06/19/20 0001 06/20/20 0429  Weight: 87.6 kg 93 kg 93.8 kg    Examination:  General exam: Appears calm and comfortable, Alert, oriented x 3  Respiratory system: Clear to auscultation. Respiratory effort normal. Cardiovascular system: S1 & S2 heard, RRR. No JVD, murmurs, rubs, gallops or clicks. No pedal edema. Gastrointestinal system: Abdomen is nondistended, soft and nontender. No organomegaly or masses felt. Normal bowel sounds heard. Central nervous system: Alert and oriented. No focal neurological deficits. Extremities: No leg edema, no cyanosis no clubbing. Skin: No rashes, lesions or ulcers Psychiatry: Judgement and insight appear normal. Mood & affect appropriate.     Data Reviewed: I have personally reviewed following labs and imaging studies  CBC: Recent Labs  Lab 06/18/20 1723 06/19/20 0120 06/20/20 0556  WBC 5.6 6.5 4.9  HGB 15.4 14.4 14.8  HCT 47.0 44.9 46.1  MCV 86.9 87.5 87.5  PLT 288 277 161   Basic Metabolic Panel: Recent Labs  Lab 06/18/20 1723 06/19/20 0120 06/20/20 0556  NA 139 141 140  K 4.0 4.0 4.0  CL 106 104 106  CO2 23 27 27   GLUCOSE 100* 161* 107*  BUN 22 19 15   CREATININE 1.25* 1.35* 1.34*  CALCIUM 8.9 8.6* 8.8*  MG 2.2  --   --    GFR: Estimated Creatinine Clearance: 59.7 mL/min (A) (by C-G formula based on SCr of 1.34 mg/dL (H)). Liver Function Tests: No results for input(s): AST, ALT, ALKPHOS, BILITOT, PROT, ALBUMIN in the last 168 hours. No results for input(s): LIPASE, AMYLASE in the last 168 hours. No results for input(s): AMMONIA in the last 168 hours. Coagulation Profile: Recent Labs  Lab 06/20/20 1252  INR 1.3*   Cardiac  Enzymes: No results for input(s): CKTOTAL, CKMB, CKMBINDEX, TROPONINI in the last 168 hours. BNP (last 3 results) No results for input(s): PROBNP in the last 8760 hours. HbA1C: Recent Labs    06/19/20 0120  HGBA1C 5.8*   CBG: No results for input(s): GLUCAP in the last 168 hours. Lipid Profile: Recent Labs    06/19/20 0120  CHOL 183  HDL 58  LDLCALC 116*  TRIG 45  CHOLHDL 3.2   Thyroid Function Tests: Recent Labs    06/19/20 0120  TSH 1.467   Anemia Panel: No results for input(s): VITAMINB12, FOLATE, FERRITIN, TIBC, IRON, RETICCTPCT in the last 72 hours. Sepsis Labs: No results for input(s): PROCALCITON, LATICACIDVEN in the last 168 hours.  Recent Results (from the past 240 hour(s))  SARS Coronavirus 2 by RT PCR (hospital order, performed in [facility] hospital lab) Nasopharyngeal Nasopharyngeal Swab     Status: None   Collection Time: 06/18/20  7:13 PM   Specimen: Nasopharyngeal Swab  Result Value Ref Range Status  SARS Coronavirus 2 NEGATIVE NEGATIVE Final    Comment: (NOTE) SARS-CoV-2 target nucleic acids are NOT DETECTED.  The SARS-CoV-2 RNA is generally detectable in upper and lower respiratory specimens during the acute phase of infection. The lowest concentration of SARS-CoV-2 viral copies this assay can detect is 250 copies / mL. A negative result does not preclude SARS-CoV-2 infection and should not be used as the sole basis for treatment or other patient management decisions.  A negative result may occur with improper specimen collection / handling, submission of specimen other than nasopharyngeal swab, presence of viral mutation(s) within the areas targeted by this assay, and inadequate number of viral copies (<250 copies / mL). A negative result must be combined with clinical observations, patient history, and epidemiological information.  Fact Sheet for Patients:   StrictlyIdeas.no  Fact Sheet for Healthcare  Providers: BankingDealers.co.za  This test is not yet approved or  cleared by the [place] FDA and has been authorized for detection and/or diagnosis of SARS-CoV-2 by FDA under an Emergency Use Authorization (EUA).  This EUA will remain in effect (meaning this test can be used) for the duration of the COVID-19 declaration under Section 564(b)(1) of the Act, 21 U.S.C. section 360bbb-3(b)(1), unless the authorization is terminated or revoked sooner.  Performed at [facility], [place] [address], [place], [place] [address]     Radiology Studies: DG Chest Portable 1 View  Result Date: 06/18/2020 CLINICAL DATA:  Shortness of breath EXAM: PORTABLE CHEST 1 VIEW COMPARISON:  August 27, 2019 FINDINGS: Heart size remains enlarged. There is mild vascular congestion without overt pulmonary edema. There is no pneumothorax. No large pleural effusion. No acute osseous abnormality. IMPRESSION: Cardiomegaly with mild vascular congestion. Electronically Signed   By: Constance Holster M.D.   On: 06/18/2020 17:43   ECHOCARDIOGRAM COMPLETE  Result Date: 06/19/2020    ECHOCARDIOGRAM REPORT   Patient Name:   Christopher Wilkins Date of Exam: 06/19/2020 Medical Rec #:  [id number]     Height:       66.0 in Accession #:    [id number]    Weight:       205.0 lb Date of Birth:  1956/06/03     BSA:          2.021 m Patient Age:    34 years      BP:           109/89 mmHg Patient Gender: M             HR:           88 bpm. Exam Location:  Inpatient Procedure: 2D Echo, Cardiac Doppler and Color Doppler Indications:    Atrial Fibrillation 427.31 / I48.91  History:        Patient has no prior history of Echocardiogram examinations.                 Arrythmias:Atrial Fibrillation; Risk Factors:Hypertension.  Sonographer:    Bernadene Person RDCS Referring Phys: [id number] [place]  1. Left ventricular ejection fraction, by estimation, is 35 to 40%. The left ventricle has moderately decreased  function. The left ventricle demonstrates global hypokinesis. The left ventricular internal cavity size was moderately dilated. Left ventricular diastolic function could not be evaluated. There is severe hypokinesis of the left ventricular, entire anteroseptal wall and anterior wall.  2. Right ventricular systolic function is low normal. The right ventricular size is normal. Tricuspid regurgitation signal is inadequate for assessing PA pressure.  3. Left atrial size was severely dilated.  4. Right atrial size was mildly dilated.  5. Restricted posterior leaflet with heavy calcification of annular and posteromedial papillary muscles. The mitral valve is abnormal. Mild mitral valve regurgitation. Moderate mitral annular calcification.  6. The aortic valve is calcified. There is moderate calcification of the aortic valve. There is moderate thickening of the aortic valve. Aortic valve regurgitation is not visualized.  7. The inferior vena cava is dilated in size with >50% respiratory variability, suggesting right atrial pressure of 8 mmHg. Comparison(s): No prior Echocardiogram. FINDINGS  Left Ventricle: Both global and focal wall motion abnormalities as noted. Left ventricular ejection fraction, by estimation, is 35 to 40%. The left ventricle has moderately decreased function. The left ventricle demonstrates global hypokinesis. Severe hypokinesis of the left ventricular, entire anteroseptal wall and anterior wall. The left ventricular internal cavity size was moderately dilated. There is no left ventricular hypertrophy. Left ventricular diastolic function could not be evaluated due to  atrial fibrillation. Left ventricular diastolic function could not be evaluated. Right Ventricle: The right ventricular size is normal. Right vetricular wall thickness was not assessed. Right ventricular systolic function is low normal. Tricuspid regurgitation signal is inadequate for assessing PA pressure. Left Atrium: Left atrial size  was severely dilated. Right Atrium: Right atrial size was mildly dilated. Pericardium: Trivial pericardial effusion is present. Mitral Valve: Restricted posterior leaflet with heavy calcification of annular and posteromedial papillary muscles. The mitral valve is abnormal. Severely decreased mobility of the mitral valve leaflets. Moderate mitral annular calcification. Mild mitral  valve regurgitation. Tricuspid Valve: The tricuspid valve is normal in structure. Tricuspid valve regurgitation is trivial. No evidence of tricuspid stenosis. Aortic Valve: Likely tricuspid, but significantly calcified. The aortic valve is calcified. There is moderate calcification of the aortic valve. There is moderate thickening of the aortic valve. Aortic valve regurgitation is not visualized. Pulmonic Valve: The pulmonic valve was grossly normal. Pulmonic valve regurgitation is not visualized. Aorta: The aortic root and ascending aorta are structurally normal, with no evidence of dilitation. Venous: The inferior vena cava is dilated in size with greater than 50% respiratory variability, suggesting right atrial pressure of 8 mmHg. IAS/Shunts: The atrial septum is grossly normal.  LEFT VENTRICLE PLAX 2D LVIDd:         6.00 cm LVIDs:         4.60 cm LV PW:         1.00 cm LV IVS:        0.80 cm LVOT diam:     2.00 cm LV SV:         56 LV SV Index:   28 LVOT Area:     3.14 cm  LV Volumes (MOD) LV vol d, MOD A2C: 147.0 ml LV vol d, MOD A4C: 163.0 ml LV vol s, MOD A2C: 94.9 ml LV vol s, MOD A4C: 91.6 ml LV SV MOD A2C:     52.1 ml LV SV MOD A4C:     163.0 ml LV SV MOD BP:      61.7 ml RIGHT VENTRICLE TAPSE (M-mode): 1.3 cm LEFT ATRIUM              Index       RIGHT ATRIUM           Index LA diam:        4.50 cm  2.23 cm/m  RA Area:     18.80 cm LA Vol (A2C):   150.0 ml 74.22 ml/m  RA Volume:   50.90 ml  25.19 ml/m LA Vol (A4C):   182.0 ml 90.06 ml/m LA Biplane Vol: 168.0 ml 83.13 ml/m  AORTIC VALVE LVOT Vmax:   98.25 cm/s LVOT Vmean:   71.750 cm/s LVOT VTI:    0.180 m  AORTA Ao Root diam: 3.50 cm Ao Asc diam:  2.90 cm MR Peak grad:    68.9 mmHg MR Mean grad:    42.0 mmHg   SHUNTS MR Vmax:         415.00 cm/s Systemic VTI:  0.18 m MR Vmean:        307.0 cm/s  Systemic Diam: 2.00 cm MR PISA:         0.57 cm MR PISA Eff ROA: 5 mm MR PISA Radius:  0.30 cm Buford Dresser MD Electronically signed by Buford Dresser MD Signature Date/Time: 06/19/2020/5:45:50 PM    Final    ECHO TEE  Result Date: 06/20/2020    TRANSESOPHOGEAL ECHO REPORT   Patient Name:   Christopher Wilkins Date of Exam: 06/20/2020 Medical Rec #:  [id number]     Height:       66.0 in Accession #:    [id number]    Weight:       206.8 lb Date of Birth:  1956/09/05     BSA:          2.028 m Patient Age:    54 years      BP:           110/100 mmHg Patient Gender: M             HR:           121 bpm. Exam Location:  Inpatient Procedure: Transesophageal Echo, 3D Echo, Cardiac Doppler and Color Doppler Indications:     atrial fibrillation  History:         Patient has prior history of Echocardiogram examinations, most                  recent 06/19/2020.  Sonographer:     Johny Chess Referring Phys:  [id number] DVVOHYW B ROBERTS Diagnosing Phys: Candee Furbish MD PROCEDURE: After discussion of the risks and benefits of a TEE, an informed consent was obtained from the patient. The transesophogeal probe was passed without difficulty through the esophogus of the patient. Sedation performed by different physician. The patient was monitored while under deep sedation. Anesthestetic sedation was provided intravenously by Anesthesiology: 112mg  of Propofol. The patient developed no complications during the procedure. A direct current cardioversion was performed. IMPRESSIONS  1. Left ventricular ejection fraction, by estimation, is 30 to 35%. The left ventricle has moderately decreased function.  2. Right ventricular systolic function is normal. The right ventricular size is normal.  3. Left atrial  size was severely dilated. No left atrial/left atrial appendage thrombus was detected.  4. Right atrial size was severely dilated.  5. 3D images of mitral valve taken. The mitral valve is degenerative. Moderate mitral valve regurgitation. 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  6. The aortic valve is tricuspid. Aortic valve regurgitation is not visualized. Mild aortic valve sclerosis is present, with no evidence of aortic valve stenosis. FINDINGS  Left Ventricle: Left ventricular ejection fraction, by estimation, is 30 to 35%. The left ventricle has moderately decreased function. The left ventricular internal cavity size was normal in size. Right Ventricle: The right ventricular size is normal. No increase in right  ventricular wall thickness. Right ventricular systolic function is normal. Left Atrium: Left atrial size was severely dilated. No left atrial/left atrial appendage thrombus was detected. Right Atrium: Right atrial size was severely dilated. Pericardium: There is no evidence of pericardial effusion. Mitral Valve: 3D images of mitral valve taken. The mitral valve is degenerative in appearance. 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 There is mild thickening of the mitral valve leaflet(s). There is mild calcification of the mitral valve leaflet(s). Mildly decreased mobility of the mitral valve posterior leaflet. Mild mitral annular calcification. Moderate mitral valve regurgitation. Tricuspid Valve: The tricuspid valve is normal in structure. Tricuspid valve regurgitation is mild. Aortic Valve: The aortic valve is tricuspid. Aortic valve regurgitation is not visualized. Mild aortic valve sclerosis is present, with no evidence of aortic valve stenosis. Pulmonic Valve: The pulmonic valve was not assessed. Pulmonic valve regurgitation is not visualized. Aorta: The aortic root  is normal in size and structure. IAS/Shunts: No atrial level shunt detected by color flow Doppler. Candee Furbish MD Electronically signed by Candee Furbish MD Signature Date/Time: 06/20/2020/2:19:34 PM    Final    Scheduled Meds: . apixaban  5 mg Oral BID  . furosemide  20 mg Oral Daily  . metoprolol tartrate  25 mg Oral BID  . nicotine  14 mg Transdermal Daily   Continuous Infusions:   LOS: 2 days    Time spent: 25 mins.    Shawna Clamp, MD [facility]   If 7PM-7AM, please contact night-coverage

## 2020-06-20 NOTE — Progress Notes (Signed)
PT Cancellation Note  Patient Details Name: Christopher Wilkins MRN: [id number] DOB: [birth year]   Cancelled Treatment:   Pt off floor for TEE. Will follow up later today vs another date. Acute PT to continue.   Willow Ora, PTA, CLT Acute Rehab Services [phone] 06/20/20, 10:55 AM  Willow Ora 06/20/2020, 10:55 AM

## 2020-06-20 NOTE — CV Procedure (Signed)
   Transesophageal Echocardiogram and cardioversion  Indications: AFIB  Time out performed  Anesthesia propofol  Findings:  Left Ventricle: 30-35% EF  Mitral Valve: Moderate MR, posterior leaflet restriction  Aortic Valve: Sclerosis, no stenosis, no AR  Tricuspid Valve: Mild TR  Left Atrium: No LAA thrombus  Successful cardioversion. 200J x 1.  Candee Furbish, MD

## 2020-06-20 NOTE — Progress Notes (Signed)
  Echocardiogram Echocardiogram Transesophageal has been performed.  Christopher Wilkins 06/20/2020, 12:56 PM

## 2020-06-20 NOTE — Plan of Care (Signed)
  Problem: Activity: Goal: Capacity to carry out activities will improve Outcome: Progressing   Problem: Activity: Goal: Capacity to carry out activities will improve Outcome: Progressing   Problem: Education: Goal: Individualized Educational Video(s) Outcome: Progressing   Problem: Education: Goal: Ability to verbalize understanding of medication therapies will improve Outcome: Progressing

## 2020-06-20 NOTE — Plan of Care (Signed)

## 2020-06-20 NOTE — Progress Notes (Addendum)
Heart Failure Stewardship Pharmacist Progress Note   PCP: Rogers Blocker, MD PCP-Cardiologist: No primary care provider on file.    HPI:  64 yo M with PMH of HTN, obesity, and tobacco use. He presented to [facility] ED following PCP visit on 06/18/20 with concern for new onset afib. An ECHO was done on 06/19/20 and showed newly reduced LVEF of 35-40%. S/p successful TEE/DCCV on 06/20/20. Of note, patient was admitted in July 2020 for angioedema (lisinopril vs shellfish).  Current HF Medications: Furosemide 20 mg daily Metoprolol tartrate 25 mg BID  Prior to admission HF Medications: None  Pertinent Lab Values: . Serum creatinine 1.34, BUN 15, Potassium 4, Sodium 140, BNP 478.8, Magnesium 2.2   Vital Signs: . Weight: 206 lbs (admission weight: 193 lbs) . Blood pressure: 110/90s  . Heart rate: 70s   Medication Assistance / Insurance Benefits Check: Does the patient have prescription insurance?  No  Does the patient qualify for medication assistance through manufacturers or grants?   Pending . Eligible grants and/or patient assistance programs: pending . Medication assistance applications in progress: none  . Medication assistance applications approved: none Approved medication assistance renewals will be completed by: pending  Outpatient Pharmacy:  Prior to admission outpatient pharmacy: [facility] Is the patient willing to use [facility] at discharge? Yes Is the patient willing to transition their outpatient pharmacy to utilize a [facility] outpatient pharmacy?   Pending    Assessment: 1. Acute systolic CHF (EF 00-93%), possible etiology afib RVR. NYHA class II symptoms. - Continue furosemide 20 mg daily - Continue metoprolol 25 mg BID. Consider changing to metoprolol XL prior to discharge - Allergy (possible angioedema) noted with lisinopril. Cardiology plans to trial losartan if renal function stable - Consider starting spironolactone and/or SGLT2i prior to discharge  pending SCr and BP trends   Plan: 1) Medication changes recommended at this time: - Agree with plans to trial losartan if SCr stable  2) Patient assistance - Patient is uninsured. Has used match in the past with [facility] pharmacy.  3)  Education  - To be completed prior to discharge  Kerby Nora, PharmD, BCPS Heart Failure Stewardship Pharmacist Phone [phone]

## 2020-06-20 NOTE — Interval H&P Note (Signed)
History and Physical Interval Note:  06/20/2020 11:10 AM  Christopher Wilkins  has presented today for surgery, with the diagnosis of AFIB.  The various methods of treatment have been discussed with the patient and family. After consideration of risks, benefits and other options for treatment, the patient has consented to  Procedure(s): TRANSESOPHAGEAL ECHOCARDIOGRAM (TEE) (N/A) CARDIOVERSION (N/A) as a surgical intervention.  The patient's history has been reviewed, patient examined, no change in status, stable for surgery.  I have reviewed the patient's chart and labs.  Questions were answered to the patient's satisfaction.     [employer]

## 2020-06-20 NOTE — TOC Progression Note (Signed)
Transition of Care [facility]) - Progression Note    Patient Details  Name: Christopher Wilkins MRN: [id number] Date of Birth: [birth year]  [facility] CM/SW Contact  Zenon Mayo, RN Phone Number: 06/20/2020, 4:27 PM  Clinical Narrative:    Patient's eliquis is located in [place] he will need to get these before he leaves tomorrow.  He does not have Medicare part D.  NCM made him a follow up apt with [facility] clinic so he can get help with medications.          Expected Discharge Plan and Services                                                 Social Determinants of Health (SDOH) Interventions    Readmission Risk Interventions No flowsheet data found.

## 2020-06-20 NOTE — H&P (View-Only) (Signed)
Progress Note  Patient Name: YEHIA MCBAIN Date of Encounter: 06/20/2020  [facility] Cardiologist: No primary care provider on file.   Subjective   Denies any chest pain or dyspnea  Inpatient Medications    Scheduled Meds: . apixaban  5 mg Oral BID  . furosemide  20 mg Oral Daily  . metoprolol tartrate  25 mg Oral BID  . nicotine  14 mg Transdermal Daily   Continuous Infusions: . sodium chloride 20 mL/hr at 06/20/20 0500   PRN Meds: acetaminophen, levalbuterol, ondansetron (ZOFRAN) IV   Vital Signs    Vitals:   06/19/20 2044 06/20/20 0101 06/20/20 0429 06/20/20 0818  BP: 110/82 (!) 114/102 (!) 116/103 (!) 119/107  Pulse: 83 (!) 48 62   Resp: 18 18 18 18   Temp: 98.9 F (37.2 C) 98.3 F (36.8 C) 98 F (36.7 C) 98.1 F (36.7 C)  TempSrc: Oral Oral Oral Oral  SpO2: 99% 100% 100% 98%  Weight:   93.8 kg   Height:        Intake/Output Summary (Last 24 hours) at 06/20/2020 0856 Last data filed at 06/20/2020 0500 Gross per 24 hour  Intake 521.16 ml  Output 500 ml  Net 21.16 ml   Last 3 Weights 06/20/2020 06/19/2020 06/18/2020  Weight (lbs) 206 lb 12.8 oz 205 lb 193 lb 2 oz  Weight (kg) 93.804 kg 92.987 kg 87.6 kg      Telemetry    AF rate 100-120s - Personally Reviewed  ECG    No new ECG - Personally Reviewed  Physical Exam   GEN: No acute distress.   Neck: No JVD Cardiac: irregular, tachycardic, 2/6 systolic murmur Respiratory: Clear to auscultation bilaterally. GI: Soft, nontender, non-distended  MS: No edema; No deformity. Neuro:  Nonfocal  Psych: Normal affect   Labs    High Sensitivity Troponin:   Recent Labs  Lab 06/18/20 1723 06/18/20 1945 06/18/20 2146 06/19/20 0120  TROPONINIHS 55* 53* 53* 44*      Chemistry Recent Labs  Lab 06/18/20 1723 06/19/20 0120 06/20/20 0556  NA 139 141 140  K 4.0 4.0 4.0  CL 106 104 106  CO2 23 27 27   GLUCOSE 100* 161* 107*  BUN 22 19 15   CREATININE 1.25* 1.35* 1.34*  CALCIUM 8.9 8.6* 8.8*    GFRNONAA >60 55* 56*  GFRAA >60 >60 >60  ANIONGAP 10 10 7      Hematology Recent Labs  Lab 06/18/20 1723 06/19/20 0120 06/20/20 0556  WBC 5.6 6.5 4.9  RBC 5.41 5.13 5.27  HGB 15.4 14.4 14.8  HCT 47.0 44.9 46.1  MCV 86.9 87.5 87.5  MCH 28.5 28.1 28.1  MCHC 32.8 32.1 32.1  RDW 14.4 14.7 14.4  PLT 288 277 267    BNP Recent Labs  Lab 06/18/20 1945 06/19/20 0120  BNP 444.2* 478.8*     DDimer No results for input(s): DDIMER in the last 168 hours.   Radiology    DG Chest Portable 1 View  Result Date: 06/18/2020 CLINICAL DATA:  Shortness of breath EXAM: PORTABLE CHEST 1 VIEW COMPARISON:  August 27, 2019 FINDINGS: Heart size remains enlarged. There is mild vascular congestion without overt pulmonary edema. There is no pneumothorax. No large pleural effusion. No acute osseous abnormality. IMPRESSION: Cardiomegaly with mild vascular congestion. Electronically Signed   By: Constance Holster M.D.   On: 06/18/2020 17:43   ECHOCARDIOGRAM COMPLETE  Result Date: 06/19/2020    ECHOCARDIOGRAM REPORT   Patient Name:   JAMERION CABELLO  Marchiano Date of Exam: 06/19/2020 Medical Rec #:  [id number]     Height:       66.0 in Accession #:    [id number]    Weight:       205.0 lb Date of Birth:  04-28-1956     BSA:          2.021 m Patient Age:    38 years      BP:           109/89 mmHg Patient Gender: M             HR:           88 bpm. Exam Location:  Inpatient Procedure: 2D Echo, Cardiac Doppler and Color Doppler Indications:    Atrial Fibrillation 427.31 / I48.91  History:        Patient has no prior history of Echocardiogram examinations.                 Arrythmias:Atrial Fibrillation; Risk Factors:Hypertension.  Sonographer:    Bernadene Person RDCS Referring Phys: [id number] [place]  1. Left ventricular ejection fraction, by estimation, is 35 to 40%. The left ventricle has moderately decreased function. The left ventricle demonstrates global hypokinesis. The left ventricular internal cavity size  was moderately dilated. Left ventricular diastolic function could not be evaluated. There is severe hypokinesis of the left ventricular, entire anteroseptal wall and anterior wall.  2. Right ventricular systolic function is low normal. The right ventricular size is normal. Tricuspid regurgitation signal is inadequate for assessing PA pressure.  3. Left atrial size was severely dilated.  4. Right atrial size was mildly dilated.  5. Restricted posterior leaflet with heavy calcification of annular and posteromedial papillary muscles. The mitral valve is abnormal. Mild mitral valve regurgitation. Moderate mitral annular calcification.  6. The aortic valve is calcified. There is moderate calcification of the aortic valve. There is moderate thickening of the aortic valve. Aortic valve regurgitation is not visualized.  7. The inferior vena cava is dilated in size with >50% respiratory variability, suggesting right atrial pressure of 8 mmHg. Comparison(s): No prior Echocardiogram. FINDINGS  Left Ventricle: Both global and focal wall motion abnormalities as noted. Left ventricular ejection fraction, by estimation, is 35 to 40%. The left ventricle has moderately decreased function. The left ventricle demonstrates global hypokinesis. Severe hypokinesis of the left ventricular, entire anteroseptal wall and anterior wall. The left ventricular internal cavity size was moderately dilated. There is no left ventricular hypertrophy. Left ventricular diastolic function could not be evaluated due to  atrial fibrillation. Left ventricular diastolic function could not be evaluated. Right Ventricle: The right ventricular size is normal. Right vetricular wall thickness was not assessed. Right ventricular systolic function is low normal. Tricuspid regurgitation signal is inadequate for assessing PA pressure. Left Atrium: Left atrial size was severely dilated. Right Atrium: Right atrial size was mildly dilated. Pericardium: Trivial  pericardial effusion is present. Mitral Valve: Restricted posterior leaflet with heavy calcification of annular and posteromedial papillary muscles. The mitral valve is abnormal. Severely decreased mobility of the mitral valve leaflets. Moderate mitral annular calcification. Mild mitral  valve regurgitation. Tricuspid Valve: The tricuspid valve is normal in structure. Tricuspid valve regurgitation is trivial. No evidence of tricuspid stenosis. Aortic Valve: Likely tricuspid, but significantly calcified. The aortic valve is calcified. There is moderate calcification of the aortic valve. There is moderate thickening of the aortic valve. Aortic valve regurgitation is not visualized. Pulmonic Valve: The pulmonic valve was grossly  normal. Pulmonic valve regurgitation is not visualized. Aorta: The aortic root and ascending aorta are structurally normal, with no evidence of dilitation. Venous: The inferior vena cava is dilated in size with greater than 50% respiratory variability, suggesting right atrial pressure of 8 mmHg. IAS/Shunts: The atrial septum is grossly normal.  LEFT VENTRICLE PLAX 2D LVIDd:         6.00 cm LVIDs:         4.60 cm LV PW:         1.00 cm LV IVS:        0.80 cm LVOT diam:     2.00 cm LV SV:         56 LV SV Index:   28 LVOT Area:     3.14 cm  LV Volumes (MOD) LV vol d, MOD A2C: 147.0 ml LV vol d, MOD A4C: 163.0 ml LV vol s, MOD A2C: 94.9 ml LV vol s, MOD A4C: 91.6 ml LV SV MOD A2C:     52.1 ml LV SV MOD A4C:     163.0 ml LV SV MOD BP:      61.7 ml RIGHT VENTRICLE TAPSE (M-mode): 1.3 cm LEFT ATRIUM              Index       RIGHT ATRIUM           Index LA diam:        4.50 cm  2.23 cm/m  RA Area:     18.80 cm LA Vol (A2C):   150.0 ml 74.22 ml/m RA Volume:   50.90 ml  25.19 ml/m LA Vol (A4C):   182.0 ml 90.06 ml/m LA Biplane Vol: 168.0 ml 83.13 ml/m  AORTIC VALVE LVOT Vmax:   98.25 cm/s LVOT Vmean:  71.750 cm/s LVOT VTI:    0.180 m  AORTA Ao Root diam: 3.50 cm Ao Asc diam:  2.90 cm MR Peak  grad:    68.9 mmHg MR Mean grad:    42.0 mmHg   SHUNTS MR Vmax:         415.00 cm/s Systemic VTI:  0.18 m MR Vmean:        307.0 cm/s  Systemic Diam: 2.00 cm MR PISA:         0.57 cm MR PISA Eff ROA: 5 mm MR PISA Radius:  0.30 cm Buford Dresser MD Electronically signed by Buford Dresser MD Signature Date/Time: 06/19/2020/5:45:50 PM    Final     Cardiac Studies   Echo 06/19/20: 1. Left ventricular ejection fraction, by estimation, is 35 to 40%. The  left ventricle has moderately decreased function. The left ventricle  demonstrates global hypokinesis. The left ventricular internal cavity size  was moderately dilated. Left  ventricular diastolic function could not be evaluated. There is severe  hypokinesis of the left ventricular, entire anteroseptal wall and anterior  wall.  2. Right ventricular systolic function is low normal. The right  ventricular size is normal. Tricuspid regurgitation signal is inadequate  for assessing PA pressure.  3. Left atrial size was severely dilated.  4. Right atrial size was mildly dilated.  5. Restricted posterior leaflet with heavy calcification of annular and  posteromedial papillary muscles. The mitral valve is abnormal. Mild mitral  valve regurgitation. Moderate mitral annular calcification.  6. The aortic valve is calcified. There is moderate calcification of the  aortic valve. There is moderate thickening of the aortic valve. Aortic  valve regurgitation is not visualized.  7. The inferior  vena cava is dilated in size with >50% respiratory  variability, suggesting right atrial pressure of 8 mmHg.   Patient Profile     64 y.o. male with a hx of HTN, obesity and tobacco use who is being seen today for the evaluation of Afib at the request of Dr. Dwyane Dee.  Assessment & Plan    Afib RVR: new onset, suspect developed about 2 weeks ago when his shortness of breath began. HR noted in the 130s on admission. 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 -Started on metoprolol 25 mg twice daily, will consolidate to XL on discharge -Plan to start losartan if renal function stable -Continue Lasix 20 mg daily  HTN: home medications include amlodipine and HCTZ which were held on admission. Recommendations for metoprolol as above.  Plan to add losartan if stable renal function.  Elevated Troponin: peak of 55 with low flat trend, not consistent with ACS. No reported chest pain.   Tobacco use: cessation advised  For questions or updates, please contact [place] Please consult [email or web address] for contact info under        Signed, Donato Heinz, MD  06/20/2020, 8:56 AM

## 2020-06-21 LAB — BASIC METABOLIC PANEL
Anion gap: 9 (ref 5–15)
BUN: 17 mg/dL (ref 8–23)
CO2: 23 mmol/L (ref 22–32)
Calcium: 8.7 mg/dL — ABNORMAL LOW (ref 8.9–10.3)
Chloride: 106 mmol/L (ref 98–111)
Creatinine, Ser: 1.31 mg/dL — ABNORMAL HIGH (ref 0.61–1.24)
GFR calc Af Amer: >60 mL/min (ref >60)
GFR calc non Af Amer: 57 mL/min — ABNORMAL LOW (ref >60)
Glucose, Bld: 98 mg/dL (ref 70–99)
Potassium: 4.1 mmol/L (ref 3.5–5.1)
Sodium: 138 mmol/L (ref 135–145)

## 2020-06-21 LAB — CBC
HCT: 47.7 % (ref 39.0–52.0)
Hemoglobin: 15.1 g/dL (ref 13.0–17.0)
MCH: 27.7 pg (ref 26.0–34.0)
MCHC: 31.7 g/dL (ref 30.0–36.0)
MCV: 87.4 fL (ref 80.0–100.0)
Platelets: 256 10*3/uL (ref 150–400)
RBC: 5.46 MIL/uL (ref 4.22–5.81)
RDW: 14.5 % (ref 11.5–15.5)
WBC: 5.7 10*3/uL (ref 4.0–10.5)
nRBC: 0 % (ref 0.0–0.2)

## 2020-06-21 MED ORDER — SACUBITRIL-VALSARTAN 24-26 MG PO TABS
1.0000 | ORAL_TABLET | Freq: Two times a day (BID) | ORAL | Status: DC
  Administered 2020-06-21 – 2020-06-22 (×3): 1 via ORAL
  Filled 2020-06-21 (×3): qty 1

## 2020-06-21 NOTE — Plan of Care (Signed)

## 2020-06-21 NOTE — Progress Notes (Signed)
PROGRESS NOTE    Christopher Wilkins  WEX:[id number] DOB: Jul 08, 1956 DOA: 06/18/2020 PCP: Rogers Blocker, MD    Brief Narrative: 64 years old male with medical history significant for essential hypertension, tobacco use disorder, obesity presented to the ED with progressive shortness of breath for 2 weeks.  He went to see his PCP for yearly checkup found to have a new onset A. fib with RVR on EKG. Patient was sent to the ER.  Patient also reported associated dyspnea and orthopnea. Patient was admitted for new onset A. fib with RVR,  started on Cardizem and heparin drip which dropped his blood pressure,  Cardizem drip was discontinued and transitioned to p.o. Cardizem.  Cardiology consulted recommended to continue Eliquis for 3 weeks and outpatient follow up , heart rate controlled.  Cardizem was changed to metoprolol.  Echocardiogram shows EF 35 to 40%.  Patient underwent TEE and successful cardioversion for A. fib.   S/p post TEE/DCC 06/20/20 maintaining NSR.  Given low ejection fraction,  started on Entresto at low dose.  Per cardiology will observe for 24 hours.  Assessment & Plan:   Active Problems:   Atrial fibrillation with RVR (HCC)   New onset A. fib with RVR Presented with dyspnea for 2 weeks duration associated with orthopnea and nonproductive cough. Twelve-lead EKG showing A. fib rate of 137 Started on Cardizem drip in the ED, CHA2DS2-VASc score 2 with suspected acute CHF No recent head trauma, no recent bleeding, no prior history of CVA or TIA Started on heparin drip, follow results of 2D echo May need to switch to DOAC if A. fib is sustained. Start low-dose Lopressor 12.5 mg twice daily. Continue to closely monitor on telemetry Cardiology consulted, heart rate controlled. Cardizem changed to metoprolol twice daily. Continue Eliquis as previously ordered Echocardiogram shows EF 35 to 40%.   Patient underwent TEE and successful cardioversion for A. fib.   S/p post TEE/DCC 06/20/20  maintaining NSR Given low ejection fraction,  started on Entresto at low dose.  Elevated troponin, suspect demand ischemia in the setting of A. fib with RVR Troponin S peaked at 55 No sign of acute ischemia on twelve-lead EKG  Mild pulmonary edema, suspect secondary to A. fib with RVR Independently reviewed chest x-ray done on admission which shows cardiomegaly with mild increase in pulmonary vascularity suggestive of pulmonary edema BNP 478.0 Give low dose of IV Lasix 20 mg x 1. Echo : EF 35 to 40% consistent with systolic CHF. Needs ACEI or ARB when renal fx better. Given low ejection fraction,  started on Entresto at low dose.  Suspected acute CHF Presented with dyspnea, orthopnea, mild pulmonary edema with cardiomegaly on chest x-ray, trace edema in lower extremities bilaterally 2D echo LV EF 35 to 40% consistent with systolic CHF. Give 1 dose IV Lasix 20 mg  Essential hypertension Started  low-dose Lopressor Hold off home amlodipine and HCTZ Monitor vital signs  Tobacco use disorder States he uses 1 pack of cigarette per week Tobacco cessation counseled on at bedside Nicotine patch   DVT prophylaxis: Eliquis  Code Status: Full Family Communication:  No family at bed side. Disposition Plan:   Status is: Inpatient  Remains inpatient appropriate because:Ongoing diagnostic testing needed not appropriate for outpatient work up   Dispo:  Patient From: Home  Planned Disposition: Home  Expected discharge date: 06/22/20 Medically stable for discharge: No   Consultants:   cardiology  Procedures: Echocardiogram.  Antimicrobials:  Anti-infectives (From admission, onward)   None  Subjective: Patient was seen and examined at bedside.  Overnight events noted.  He reports feeling better, denies any palpitation and dizziness. Patient denies any chest pain, shortness of breath.  He asks when he can be discharged. Objective: Vitals:   06/21/20 0623 06/21/20  0728 06/21/20 0820 06/21/20 1200  BP: (!) 136/100  129/90 (!) 125/95  Pulse: 85  82 83  Resp: 19  16 18   Temp: 98.4 F (36.9 C)  98.4 F (36.9 C) 98.6 F (37 C)  TempSrc: Oral  Oral Oral  SpO2: 96%  96% 97%  Weight:  93.1 kg    Height:        Intake/Output Summary (Last 24 hours) at 06/21/2020 1356 Last data filed at 06/21/2020 1200 Gross per 24 hour  Intake 960 ml  Output 1050 ml  Net -90 ml   Filed Weights   06/19/20 0001 06/20/20 0429 06/21/20 0728  Weight: 93 kg 93.8 kg 93.1 kg    Examination:  General exam: Appears calm and comfortable, Alert, oriented x 3  Respiratory system: Clear to auscultation. Respiratory effort normal. Cardiovascular system: S1 & S2 heard, RRR. No JVD, murmurs, rubs, gallops or clicks. No pedal edema. Gastrointestinal system: Abdomen is nondistended, soft and nontender. No organomegaly or masses felt. Normal bowel sounds heard. Central nervous system: Alert and oriented. No focal neurological deficits. Extremities: No leg edema, no cyanosis no clubbing. Skin: No rashes, lesions or ulcers Psychiatry: Judgement and insight appear normal. Mood & affect appropriate.     Data Reviewed: I have personally reviewed following labs and imaging studies  CBC: Recent Labs  Lab 06/18/20 1723 06/19/20 0120 06/20/20 0556 06/21/20 0628  WBC 5.6 6.5 4.9 5.7  HGB 15.4 14.4 14.8 15.1  HCT 47.0 44.9 46.1 47.7  MCV 86.9 87.5 87.5 87.4  PLT 288 277 267 703   Basic Metabolic Panel: Recent Labs  Lab 06/18/20 1723 06/19/20 0120 06/20/20 0556 06/21/20 0628  NA 139 141 140 138  K 4.0 4.0 4.0 4.1  CL 106 104 106 106  CO2 23 27 27 23   GLUCOSE 100* 161* 107* 98  BUN 22 19 15 17   CREATININE 1.25* 1.35* 1.34* 1.31*  CALCIUM 8.9 8.6* 8.8* 8.7*  MG 2.2  --   --   --    GFR: Estimated Creatinine Clearance: 60.8 mL/min (A) (by C-G formula based on SCr of 1.31 mg/dL (H)). Liver Function Tests: No results for input(s): AST, ALT, ALKPHOS, BILITOT, PROT,  ALBUMIN in the last 168 hours. No results for input(s): LIPASE, AMYLASE in the last 168 hours. No results for input(s): AMMONIA in the last 168 hours. Coagulation Profile: Recent Labs  Lab 06/20/20 1252  INR 1.3*   Cardiac Enzymes: No results for input(s): CKTOTAL, CKMB, CKMBINDEX, TROPONINI in the last 168 hours. BNP (last 3 results) No results for input(s): PROBNP in the last 8760 hours. HbA1C: Recent Labs    06/19/20 0120  HGBA1C 5.8*   CBG: No results for input(s): GLUCAP in the last 168 hours. Lipid Profile: Recent Labs    06/19/20 0120  CHOL 183  HDL 58  LDLCALC 116*  TRIG 45  CHOLHDL 3.2   Thyroid Function Tests: Recent Labs    06/19/20 0120  TSH 1.467   Anemia Panel: No results for input(s): VITAMINB12, FOLATE, FERRITIN, TIBC, IRON, RETICCTPCT in the last 72 hours. Sepsis Labs: No results for input(s): PROCALCITON, LATICACIDVEN in the last 168 hours.  Recent Results (from the past 240 hour(s))  SARS Coronavirus 2  by RT PCR (hospital order, performed in [facility] hospital lab) Nasopharyngeal Nasopharyngeal Swab     Status: None   Collection Time: 06/18/20  7:13 PM   Specimen: Nasopharyngeal Swab  Result Value Ref Range Status   SARS Coronavirus 2 NEGATIVE NEGATIVE Final    Comment: (NOTE) SARS-CoV-2 target nucleic acids are NOT DETECTED.  The SARS-CoV-2 RNA is generally detectable in upper and lower respiratory specimens during the acute phase of infection. The lowest concentration of SARS-CoV-2 viral copies this assay can detect is 250 copies / mL. A negative result does not preclude SARS-CoV-2 infection and should not be used as the sole basis for treatment or other patient management decisions.  A negative result may occur with improper specimen collection / handling, submission of specimen other than nasopharyngeal swab, presence of viral mutation(s) within the areas targeted by this assay, and inadequate number of viral copies (<250 copies /  mL). A negative result must be combined with clinical observations, patient history, and epidemiological information.  Fact Sheet for Patients:   StrictlyIdeas.no  Fact Sheet for Healthcare Providers: BankingDealers.co.za  This test is not yet approved or  cleared by the [place] FDA and has been authorized for detection and/or diagnosis of SARS-CoV-2 by FDA under an Emergency Use Authorization (EUA).  This EUA will remain in effect (meaning this test can be used) for the duration of the COVID-19 declaration under Section 564(b)(1) of the Act, 21 U.S.C. section 360bbb-3(b)(1), unless the authorization is terminated or revoked sooner.  Performed at [facility], [place] [address], [place], [place] [address]     Radiology Studies: ECHOCARDIOGRAM COMPLETE  Result Date: 06/19/2020    ECHOCARDIOGRAM REPORT   Patient Name:   Christopher Wilkins Date of Exam: 06/19/2020 Medical Rec #:  [id number]     Height:       66.0 in Accession #:    [id number]    Weight:       205.0 lb Date of Birth:  [birth year]     BSA:          2.021 m Patient Age:    6 years      BP:           109/89 mmHg Patient Gender: M             HR:           88 bpm. Exam Location:  Inpatient Procedure: 2D Echo, Cardiac Doppler and Color Doppler Indications:    Atrial Fibrillation 427.31 / I48.91  History:        Patient has no prior history of Echocardiogram examinations.                 Arrythmias:Atrial Fibrillation; Risk Factors:Hypertension.  Sonographer:    Bernadene Person RDCS Referring Phys: [id number] [place]  1. Left ventricular ejection fraction, by estimation, is 35 to 40%. The left ventricle has moderately decreased function. The left ventricle demonstrates global hypokinesis. The left ventricular internal cavity size was moderately dilated. Left ventricular diastolic function could not be evaluated. There is severe hypokinesis of the left ventricular, entire  anteroseptal wall and anterior wall.  2. Right ventricular systolic function is low normal. The right ventricular size is normal. Tricuspid regurgitation signal is inadequate for assessing PA pressure.  3. Left atrial size was severely dilated.  4. Right atrial size was mildly dilated.  5. Restricted posterior leaflet with heavy calcification of annular and posteromedial papillary muscles. The mitral valve  is abnormal. Mild mitral valve regurgitation. Moderate mitral annular calcification.  6. The aortic valve is calcified. There is moderate calcification of the aortic valve. There is moderate thickening of the aortic valve. Aortic valve regurgitation is not visualized.  7. The inferior vena cava is dilated in size with >50% respiratory variability, suggesting right atrial pressure of 8 mmHg. Comparison(s): No prior Echocardiogram. FINDINGS  Left Ventricle: Both global and focal wall motion abnormalities as noted. Left ventricular ejection fraction, by estimation, is 35 to 40%. The left ventricle has moderately decreased function. The left ventricle demonstrates global hypokinesis. Severe hypokinesis of the left ventricular, entire anteroseptal wall and anterior wall. The left ventricular internal cavity size was moderately dilated. There is no left ventricular hypertrophy. Left ventricular diastolic function could not be evaluated due to  atrial fibrillation. Left ventricular diastolic function could not be evaluated. Right Ventricle: The right ventricular size is normal. Right vetricular wall thickness was not assessed. Right ventricular systolic function is low normal. Tricuspid regurgitation signal is inadequate for assessing PA pressure. Left Atrium: Left atrial size was severely dilated. Right Atrium: Right atrial size was mildly dilated. Pericardium: Trivial pericardial effusion is present. Mitral Valve: Restricted posterior leaflet with heavy calcification of annular and posteromedial papillary muscles. The  mitral valve is abnormal. Severely decreased mobility of the mitral valve leaflets. Moderate mitral annular calcification. Mild mitral  valve regurgitation. Tricuspid Valve: The tricuspid valve is normal in structure. Tricuspid valve regurgitation is trivial. No evidence of tricuspid stenosis. Aortic Valve: Likely tricuspid, but significantly calcified. The aortic valve is calcified. There is moderate calcification of the aortic valve. There is moderate thickening of the aortic valve. Aortic valve regurgitation is not visualized. Pulmonic Valve: The pulmonic valve was grossly normal. Pulmonic valve regurgitation is not visualized. Aorta: The aortic root and ascending aorta are structurally normal, with no evidence of dilitation. Venous: The inferior vena cava is dilated in size with greater than 50% respiratory variability, suggesting right atrial pressure of 8 mmHg. IAS/Shunts: The atrial septum is grossly normal.  LEFT VENTRICLE PLAX 2D LVIDd:         6.00 cm LVIDs:         4.60 cm LV PW:         1.00 cm LV IVS:        0.80 cm LVOT diam:     2.00 cm LV SV:         56 LV SV Index:   28 LVOT Area:     3.14 cm  LV Volumes (MOD) LV vol d, MOD A2C: 147.0 ml LV vol d, MOD A4C: 163.0 ml LV vol s, MOD A2C: 94.9 ml LV vol s, MOD A4C: 91.6 ml LV SV MOD A2C:     52.1 ml LV SV MOD A4C:     163.0 ml LV SV MOD BP:      61.7 ml RIGHT VENTRICLE TAPSE (M-mode): 1.3 cm LEFT ATRIUM              Index       RIGHT ATRIUM           Index LA diam:        4.50 cm  2.23 cm/m  RA Area:     18.80 cm LA Vol (A2C):   150.0 ml 74.22 ml/m RA Volume:   50.90 ml  25.19 ml/m LA Vol (A4C):   182.0 ml 90.06 ml/m LA Biplane Vol: 168.0 ml 83.13 ml/m  AORTIC VALVE LVOT Vmax:  98.25 cm/s LVOT Vmean:  71.750 cm/s LVOT VTI:    0.180 m  AORTA Ao Root diam: 3.50 cm Ao Asc diam:  2.90 cm MR Peak grad:    68.9 mmHg MR Mean grad:    42.0 mmHg   SHUNTS MR Vmax:         415.00 cm/s Systemic VTI:  0.18 m MR Vmean:        307.0 cm/s  Systemic Diam: 2.00 cm  MR PISA:         0.57 cm MR PISA Eff ROA: 5 mm MR PISA Radius:  0.30 cm Buford Dresser MD Electronically signed by Buford Dresser MD Signature Date/Time: 06/19/2020/5:45:50 PM    Final    ECHO TEE  Result Date: 06/20/2020    TRANSESOPHOGEAL ECHO REPORT   Patient Name:   Christopher Wilkins Date of Exam: 06/20/2020 Medical Rec #:  [id number]     Height:       66.0 in Accession #:    [id number]    Weight:       206.8 lb Date of Birth:  [birth year]     BSA:          2.028 m Patient Age:    11 years      BP:           110/100 mmHg Patient Gender: M             HR:           121 bpm. Exam Location:  Inpatient Procedure: Transesophageal Echo, 3D Echo, Cardiac Doppler and Color Doppler Indications:     atrial fibrillation  History:         Patient has prior history of Echocardiogram examinations, most                  recent 06/19/2020.  Sonographer:     Johny Chess Referring Phys:  [id number] ROBERTS Diagnosing Phys: Candee Furbish MD PROCEDURE: After discussion of the risks and benefits of a TEE, an informed consent was obtained from the patient. The transesophogeal probe was passed without difficulty through the esophogus of the patient. Sedation performed by different physician. The patient was monitored while under deep sedation. Anesthestetic sedation was provided intravenously by Anesthesiology: 112mg  of Propofol. The patient developed no complications during the procedure. A direct current cardioversion was performed. IMPRESSIONS  1. Left ventricular ejection fraction, by estimation, is 30 to 35%. The left ventricle has moderately decreased function.  2. Right ventricular systolic function is normal. The right ventricular size is normal.  3. Left atrial size was severely dilated. No left atrial/left atrial appendage thrombus was detected.  4. Right atrial size was severely dilated.  5. 3D images of mitral valve taken. The mitral valve is degenerative. Moderate mitral valve regurgitation. 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  6. The aortic valve is tricuspid. Aortic valve regurgitation is not visualized. Mild aortic valve sclerosis is present, with no evidence of aortic valve stenosis. FINDINGS  Left Ventricle: Left ventricular ejection fraction, by estimation, is 30 to 35%. The left ventricle has moderately decreased function. The left ventricular internal cavity size was normal in size. Right Ventricle: The right ventricular size is normal. No increase in right ventricular wall thickness. Right ventricular systolic function is normal. Left Atrium: Left atrial size was severely dilated. No left atrial/left atrial appendage thrombus was detected. Right Atrium: Right atrial size was severely  dilated. Pericardium: There is no evidence of pericardial effusion. Mitral Valve: 3D images of mitral valve taken. The mitral valve is degenerative in appearance. 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 There is mild thickening of the mitral valve leaflet(s). There is mild calcification of the mitral valve leaflet(s). Mildly decreased mobility of the mitral valve posterior leaflet. Mild mitral annular calcification. Moderate mitral valve regurgitation. Tricuspid Valve: The tricuspid valve is normal in structure. Tricuspid valve regurgitation is mild. Aortic Valve: The aortic valve is tricuspid. Aortic valve regurgitation is not visualized. Mild aortic valve sclerosis is present, with no evidence of aortic valve stenosis. Pulmonic Valve: The pulmonic valve was not assessed. Pulmonic valve regurgitation is not visualized. Aorta: The aortic root is normal in size and structure. IAS/Shunts: No atrial level shunt detected by color flow Doppler. Candee Furbish MD Electronically signed by Candee Furbish MD Signature Date/Time: 06/20/2020/2:19:34 PM    Final    Scheduled Meds:  apixaban  5 mg Oral  BID   furosemide  20 mg Oral Daily   metoprolol succinate  50 mg Oral Daily   nicotine  14 mg Transdermal Daily   sacubitril-valsartan  1 tablet Oral BID   Continuous Infusions:   LOS: 3 days    Time spent: 25 mins.    Shawna Clamp, MD [facility]   If 7PM-7AM, please contact night-coverage

## 2020-06-21 NOTE — Progress Notes (Addendum)
Progress Note  Patient Name: Christopher Wilkins Date of Encounter: 06/21/2020  [facility] Cardiologist: Gilman Schmidt  Subjective   Feels much better  Allergies note lisinopril but no specific reaction " just didn't feel well "  Should be ok to start ARB/Entresto  Inpatient Medications    Scheduled Meds: . apixaban  5 mg Oral BID  . furosemide  20 mg Oral Daily  . metoprolol succinate  50 mg Oral Daily  . nicotine  14 mg Transdermal Daily   Continuous Infusions:  PRN Meds: acetaminophen, levalbuterol, ondansetron (ZOFRAN) IV   Vital Signs    Vitals:   06/20/20 1623 06/20/20 2023 06/21/20 0623 06/21/20 0728  BP: (!) 124/97 (!) 125/95 (!) 136/100   Pulse: 84 89 85   Resp: 20 20 19    Temp: 98.6 F (37 C) 98.7 F (37.1 C) 98.4 F (36.9 C)   TempSrc: Oral Oral Oral   SpO2:  95% 96%   Weight:    93.1 kg  Height:        Intake/Output Summary (Last 24 hours) at 06/21/2020 0747 Last data filed at 06/20/2020 2023 Gross per 24 hour  Intake 440 ml  Output 1050 ml  Net -610 ml   Last 3 Weights 06/21/2020 06/20/2020 06/19/2020  Weight (lbs) 205 lb 4.8 oz 206 lb 12.8 oz 205 lb  Weight (kg) 93.123 kg 93.804 kg 92.987 kg      Telemetry    NSR   ECG    No new ECG - Personally Reviewed  Physical Exam   GEN: No acute distress.   Neck: No JVD Cardiac: irregular, tachycardic, 2/6 systolic murmur Respiratory: Clear to auscultation bilaterally. GI: Soft, nontender, non-distended  MS: No edema; No deformity. Neuro:  Nonfocal  Psych: Normal affect   Labs    High Sensitivity Troponin:   Recent Labs  Lab 06/18/20 1723 06/18/20 1945 06/18/20 2146 06/19/20 0120  TROPONINIHS 55* 53* 53* 44*      Chemistry Recent Labs  Lab 06/19/20 0120 06/20/20 0556 06/21/20 0628  NA 141 140 138  K 4.0 4.0 4.1  CL 104 106 106  CO2 27 27 23   GLUCOSE 161* 107* 98  BUN 19 15 17   CREATININE 1.35* 1.34* 1.31*  CALCIUM 8.6* 8.8* 8.7*  GFRNONAA 55* 56* 57*  GFRAA >60 >60 >60    ANIONGAP 10 7 9      Hematology Recent Labs  Lab 06/19/20 0120 06/20/20 0556 06/21/20 0628  WBC 6.5 4.9 5.7  RBC 5.13 5.27 5.46  HGB 14.4 14.8 15.1  HCT 44.9 46.1 47.7  MCV 87.5 87.5 87.4  MCH 28.1 28.1 27.7  MCHC 32.1 32.1 31.7  RDW 14.7 14.4 14.5  PLT 277 267 256    BNP Recent Labs  Lab 06/18/20 1945 06/19/20 0120  BNP 444.2* 478.8*     DDimer No results for input(s): DDIMER in the last 168 hours.   Radiology    ECHOCARDIOGRAM COMPLETE  Result Date: 06/19/2020    ECHOCARDIOGRAM REPORT   Patient Name:   Christopher Wilkins Date of Exam: 06/19/2020 Medical Rec #:  [id number]     Height:       66.0 in Accession #:    [id number]    Weight:       205.0 lb Date of Birth:  [birth year]     BSA:          2.021 m Patient Age:    1 years      BP:  109/89 mmHg Patient Gender: M             HR:           88 bpm. Exam Location:  Inpatient Procedure: 2D Echo, Cardiac Doppler and Color Doppler Indications:    Atrial Fibrillation 427.31 / I48.91  History:        Patient has no prior history of Echocardiogram examinations.                 Arrythmias:Atrial Fibrillation; Risk Factors:Hypertension.  Sonographer:    Bernadene Person RDCS Referring Phys: [id number] [place]  1. Left ventricular ejection fraction, by estimation, is 35 to 40%. The left ventricle has moderately decreased function. The left ventricle demonstrates global hypokinesis. The left ventricular internal cavity size was moderately dilated. Left ventricular diastolic function could not be evaluated. There is severe hypokinesis of the left ventricular, entire anteroseptal wall and anterior wall.  2. Right ventricular systolic function is low normal. The right ventricular size is normal. Tricuspid regurgitation signal is inadequate for assessing PA pressure.  3. Left atrial size was severely dilated.  4. Right atrial size was mildly dilated.  5. Restricted posterior leaflet with heavy calcification of annular and  posteromedial papillary muscles. The mitral valve is abnormal. Mild mitral valve regurgitation. Moderate mitral annular calcification.  6. The aortic valve is calcified. There is moderate calcification of the aortic valve. There is moderate thickening of the aortic valve. Aortic valve regurgitation is not visualized.  7. The inferior vena cava is dilated in size with >50% respiratory variability, suggesting right atrial pressure of 8 mmHg. Comparison(s): No prior Echocardiogram. FINDINGS  Left Ventricle: Both global and focal wall motion abnormalities as noted. Left ventricular ejection fraction, by estimation, is 35 to 40%. The left ventricle has moderately decreased function. The left ventricle demonstrates global hypokinesis. Severe hypokinesis of the left ventricular, entire anteroseptal wall and anterior wall. The left ventricular internal cavity size was moderately dilated. There is no left ventricular hypertrophy. Left ventricular diastolic function could not be evaluated due to  atrial fibrillation. Left ventricular diastolic function could not be evaluated. Right Ventricle: The right ventricular size is normal. Right vetricular wall thickness was not assessed. Right ventricular systolic function is low normal. Tricuspid regurgitation signal is inadequate for assessing PA pressure. Left Atrium: Left atrial size was severely dilated. Right Atrium: Right atrial size was mildly dilated. Pericardium: Trivial pericardial effusion is present. Mitral Valve: Restricted posterior leaflet with heavy calcification of annular and posteromedial papillary muscles. The mitral valve is abnormal. Severely decreased mobility of the mitral valve leaflets. Moderate mitral annular calcification. Mild mitral  valve regurgitation. Tricuspid Valve: The tricuspid valve is normal in structure. Tricuspid valve regurgitation is trivial. No evidence of tricuspid stenosis. Aortic Valve: Likely tricuspid, but significantly calcified. The  aortic valve is calcified. There is moderate calcification of the aortic valve. There is moderate thickening of the aortic valve. Aortic valve regurgitation is not visualized. Pulmonic Valve: The pulmonic valve was grossly normal. Pulmonic valve regurgitation is not visualized. Aorta: The aortic root and ascending aorta are structurally normal, with no evidence of dilitation. Venous: The inferior vena cava is dilated in size with greater than 50% respiratory variability, suggesting right atrial pressure of 8 mmHg. IAS/Shunts: The atrial septum is grossly normal.  LEFT VENTRICLE PLAX 2D LVIDd:         6.00 cm LVIDs:         4.60 cm LV PW:  1.00 cm LV IVS:        0.80 cm LVOT diam:     2.00 cm LV SV:         56 LV SV Index:   28 LVOT Area:     3.14 cm  LV Volumes (MOD) LV vol d, MOD A2C: 147.0 ml LV vol d, MOD A4C: 163.0 ml LV vol s, MOD A2C: 94.9 ml LV vol s, MOD A4C: 91.6 ml LV SV MOD A2C:     52.1 ml LV SV MOD A4C:     163.0 ml LV SV MOD BP:      61.7 ml RIGHT VENTRICLE TAPSE (M-mode): 1.3 cm LEFT ATRIUM              Index       RIGHT ATRIUM           Index LA diam:        4.50 cm  2.23 cm/m  RA Area:     18.80 cm LA Vol (A2C):   150.0 ml 74.22 ml/m RA Volume:   50.90 ml  25.19 ml/m LA Vol (A4C):   182.0 ml 90.06 ml/m LA Biplane Vol: 168.0 ml 83.13 ml/m  AORTIC VALVE LVOT Vmax:   98.25 cm/s LVOT Vmean:  71.750 cm/s LVOT VTI:    0.180 m  AORTA Ao Root diam: 3.50 cm Ao Asc diam:  2.90 cm MR Peak grad:    68.9 mmHg MR Mean grad:    42.0 mmHg   SHUNTS MR Vmax:         415.00 cm/s Systemic VTI:  0.18 m MR Vmean:        307.0 cm/s  Systemic Diam: 2.00 cm MR PISA:         0.57 cm MR PISA Eff ROA: 5 mm MR PISA Radius:  0.30 cm Buford Dresser MD Electronically signed by Buford Dresser MD Signature Date/Time: 06/19/2020/5:45:50 PM    Final    ECHO TEE  Result Date: 06/20/2020    TRANSESOPHOGEAL ECHO REPORT   Patient Name:   Christopher Wilkins Date of Exam: 06/20/2020 Medical Rec #:  [id number]      Height:       66.0 in Accession #:    [id number]    Weight:       206.8 lb Date of Birth:  09-05-1956     BSA:          2.028 m Patient Age:    56 years      BP:           110/100 mmHg Patient Gender: M             HR:           121 bpm. Exam Location:  Inpatient Procedure: Transesophageal Echo, 3D Echo, Cardiac Doppler and Color Doppler Indications:     atrial fibrillation  History:         Patient has prior history of Echocardiogram examinations, most                  recent 06/19/2020.  Sonographer:     Johny Chess Referring Phys:  [id number] OIZTIWP B ROBERTS Diagnosing Phys: Candee Furbish MD PROCEDURE: After discussion of the risks and benefits of a TEE, an informed consent was obtained from the patient. The transesophogeal probe was passed without difficulty through the esophogus of the patient. Sedation performed by different physician. The patient was monitored while under deep sedation. Anesthestetic sedation was provided  intravenously by Anesthesiology: 112mg  of Propofol. The patient developed no complications during the procedure. A direct current cardioversion was performed. IMPRESSIONS  1. Left ventricular ejection fraction, by estimation, is 30 to 35%. The left ventricle has moderately decreased function.  2. Right ventricular systolic function is normal. The right ventricular size is normal.  3. Left atrial size was severely dilated. No left atrial/left atrial appendage thrombus was detected.  4. Right atrial size was severely dilated.  5. 3D images of mitral valve taken. The mitral valve is degenerative. Moderate mitral valve regurgitation. 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  6. The aortic valve is tricuspid. Aortic valve regurgitation is not visualized. Mild aortic valve sclerosis is present, with no evidence of aortic valve stenosis. FINDINGS  Left Ventricle: Left ventricular ejection fraction, by estimation, is 30 to 35%. The  left ventricle has moderately decreased function. The left ventricular internal cavity size was normal in size. Right Ventricle: The right ventricular size is normal. No increase in right ventricular wall thickness. Right ventricular systolic function is normal. Left Atrium: Left atrial size was severely dilated. No left atrial/left atrial appendage thrombus was detected. Right Atrium: Right atrial size was severely dilated. Pericardium: There is no evidence of pericardial effusion. Mitral Valve: 3D images of mitral valve taken. The mitral valve is degenerative in appearance. 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 There is mild thickening of the mitral valve leaflet(s). There is mild calcification of the mitral valve leaflet(s). Mildly decreased mobility of the mitral valve posterior leaflet. Mild mitral annular calcification. Moderate mitral valve regurgitation. Tricuspid Valve: The tricuspid valve is normal in structure. Tricuspid valve regurgitation is mild. Aortic Valve: The aortic valve is tricuspid. Aortic valve regurgitation is not visualized. Mild aortic valve sclerosis is present, with no evidence of aortic valve stenosis. Pulmonic Valve: The pulmonic valve was not assessed. Pulmonic valve regurgitation is not visualized. Aorta: The aortic root is normal in size and structure. IAS/Shunts: No atrial level shunt detected by color flow Doppler. Candee Furbish MD Electronically signed by Candee Furbish MD Signature Date/Time: 06/20/2020/2:19:34 PM    Final     Cardiac Studies   Echo 06/19/20: 1. Left ventricular ejection fraction, by estimation, is 35 to 40%. The  left ventricle has moderately decreased function. The left ventricle  demonstrates global hypokinesis. The left ventricular internal cavity size  was moderately dilated. Left  ventricular diastolic function could not be evaluated. There is severe  hypokinesis of the left  ventricular, entire anteroseptal wall and anterior  wall.  2. Right ventricular systolic function is low normal. The right  ventricular size is normal. Tricuspid regurgitation signal is inadequate  for assessing PA pressure.  3. Left atrial size was severely dilated.  4. Right atrial size was mildly dilated.  5. Restricted posterior leaflet with heavy calcification of annular and  posteromedial papillary muscles. The mitral valve is abnormal. Mild mitral  valve regurgitation. Moderate mitral annular calcification.  6. The aortic valve is calcified. There is moderate calcification of the  aortic valve. There is moderate thickening of the aortic valve. Aortic  valve regurgitation is not visualized.  7. The inferior vena cava is dilated in size with >50% respiratory  variability, suggesting right atrial pressure of 8 mmHg.   Patient Profile     64 y.o. male with a hx of HTN, obesity and tobacco use who is being  seen today for the evaluation of Afib at the request of Dr. Dwyane Dee.  Assessment & Plan    Afib RVR: new onset, suspect developed about 2 weeks ago when his shortness of breath began. HR noted in the 130s on admission. 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 -Started on metoprolol 25 mg twice daily, will consolidate to XL on discharge -Cr is 1.3 today start entresto low dose today  -Continue Lasix 20 mg daily  HTN: See CHF meds above   Elevated Troponin: peak of 55 with low flat trend, not consistent with ACS. No reported chest pain.   Tobacco use: cessation advised  For questions or updates, please contact [place] Please consult [email or web address] for contact info under        Signed, Jenkins Rouge, MD  06/21/2020, 7:47 AM
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
## 2020-06-22 LAB — BASIC METABOLIC PANEL
Anion gap: 9 (ref 5–15)
BUN: 11 mg/dL (ref 8–23)
CO2: 22 mmol/L (ref 22–32)
Calcium: 8.6 mg/dL — ABNORMAL LOW (ref 8.9–10.3)
Chloride: 108 mmol/L (ref 98–111)
Creatinine, Ser: 1.33 mg/dL — ABNORMAL HIGH (ref 0.61–1.24)
GFR calc Af Amer: >60 mL/min (ref >60)
GFR calc non Af Amer: 56 mL/min — ABNORMAL LOW (ref >60)
Glucose, Bld: 97 mg/dL (ref 70–99)
Potassium: 4.4 mmol/L (ref 3.5–5.1)
Sodium: 139 mmol/L (ref 135–145)

## 2020-06-22 MED ORDER — FUROSEMIDE 20 MG PO TABS
20.0000 mg | ORAL_TABLET | Freq: Every day | ORAL | 1 refills | Status: DC
Start: 2020-06-23 — End: 2020-07-02

## 2020-06-22 MED ORDER — APIXABAN 5 MG PO TABS
5.0000 mg | ORAL_TABLET | Freq: Two times a day (BID) | ORAL | 3 refills | Status: DC
Start: 2020-06-22 — End: 2020-07-11

## 2020-06-22 MED ORDER — FUROSEMIDE 20 MG PO TABS
20.0000 mg | ORAL_TABLET | Freq: Every day | ORAL | 1 refills | Status: DC
Start: 2020-06-23 — End: 2020-06-22

## 2020-06-22 MED ORDER — METOPROLOL SUCCINATE ER 50 MG PO TB24
50.0000 mg | ORAL_TABLET | Freq: Every day | ORAL | 0 refills | Status: DC
Start: 2020-06-23 — End: 2020-06-22

## 2020-06-22 MED ORDER — SACUBITRIL-VALSARTAN 24-26 MG PO TABS: 1.0000 | ORAL_TABLET | Freq: Two times a day (BID) | ORAL | 2 refills | Status: DC

## 2020-06-22 MED ORDER — SENNA 8.6 MG PO TABS
1.0000 | ORAL_TABLET | Freq: Once | ORAL | Status: AC
  Administered 2020-06-22: 8.6 mg via ORAL
  Filled 2020-06-22: qty 1

## 2020-06-22 MED ORDER — METOPROLOL SUCCINATE ER 50 MG PO TB24
50.0000 mg | ORAL_TABLET | Freq: Every day | ORAL | 0 refills | Status: DC
Start: 2020-06-23 — End: 2020-07-11

## 2020-06-22 NOTE — Progress Notes (Signed)
Progress Note  Patient Name: Christopher Wilkins Date of Encounter: 06/22/2020  [facility] Cardiologist: Hundred well tolerated entresto dose yesterday afternoon   Inpatient Medications    Scheduled Meds: . apixaban  5 mg Oral BID  . furosemide  20 mg Oral Daily  . metoprolol succinate  50 mg Oral Daily  . nicotine  14 mg Transdermal Daily  . sacubitril-valsartan  1 tablet Oral BID   Continuous Infusions:  PRN Meds: acetaminophen, levalbuterol, ondansetron (ZOFRAN) IV   Vital Signs    Vitals:   06/21/20 0820 06/21/20 1200 06/21/20 2013 06/22/20 0345  BP: 129/90 (!) 125/95 (!) 124/92 121/87  Pulse: 82 83 79 78  Resp: 16 18 16 20   Temp: 98.4 F (36.9 C) 98.6 F (37 C) 98.2 F (36.8 C) 97.6 F (36.4 C)  TempSrc: Oral Oral Oral Oral  SpO2: 96% 97% 98% 96%  Weight:    92.2 kg  Height:        Intake/Output Summary (Last 24 hours) at 06/22/2020 0801 Last data filed at 06/21/2020 2026 Gross per 24 hour  Intake 1200 ml  Output 400 ml  Net 800 ml   Last 3 Weights 06/22/2020 06/21/2020 06/20/2020  Weight (lbs) 203 lb 3.2 oz 205 lb 4.8 oz 206 lb 12.8 oz  Weight (kg) 92.171 kg 93.123 kg 93.804 kg      Telemetry    NSR   ECG    No new ECG - Personally Reviewed  Physical Exam   GEN: No acute distress.   Neck: No JVD Cardiac: irregular, tachycardic, 2/6 systolic murmur Respiratory: Clear to auscultation bilaterally. GI: Soft, nontender, non-distended  MS: No edema; No deformity. Neuro:  Nonfocal  Psych: Normal affect   Labs    High Sensitivity Troponin:   Recent Labs  Lab 06/18/20 1723 06/18/20 1945 06/18/20 2146 06/19/20 0120  TROPONINIHS 55* 53* 53* 44*      Chemistry Recent Labs  Lab 06/20/20 0556 06/21/20 0628 06/22/20 0654  NA 140 138 139  K 4.0 4.1 4.4  CL 106 106 108  CO2 27 23 22   GLUCOSE 107* 98 97  BUN 15 17 11   CREATININE 1.34* 1.31* 1.33*  CALCIUM 8.8* 8.7* 8.6*  GFRNONAA 56* 57* 56*  GFRAA >60 >60 >60    ANIONGAP 7 9 9      Hematology Recent Labs  Lab 06/19/20 0120 06/20/20 0556 06/21/20 0628  WBC 6.5 4.9 5.7  RBC 5.13 5.27 5.46  HGB 14.4 14.8 15.1  HCT 44.9 46.1 47.7  MCV 87.5 87.5 87.4  MCH 28.1 28.1 27.7  MCHC 32.1 32.1 31.7  RDW 14.7 14.4 14.5  PLT 277 267 256    BNP Recent Labs  Lab 06/18/20 1945 06/19/20 0120  BNP 444.2* 478.8*     DDimer No results for input(s): DDIMER in the last 168 hours.   Radiology    ECHO TEE  Result Date: 06/20/2020    TRANSESOPHOGEAL ECHO REPORT   Patient Name:   Christopher Wilkins Date of Exam: 06/20/2020 Medical Rec #:  [id number]     Height:       66.0 in Accession #:    [id number]    Weight:       206.8 lb Date of Birth:  1956/01/27     BSA:          2.028 m Patient Age:    64 years      BP:  110/100 mmHg Patient Gender: M             HR:           121 bpm. Exam Location:  Inpatient Procedure: Transesophageal Echo, 3D Echo, Cardiac Doppler and Color Doppler Indications:     atrial fibrillation  History:         Patient has prior history of Echocardiogram examinations, most                  recent 06/19/2020.  Sonographer:     Johny Chess Referring Phys:  [id number] JKDTOIZ B ROBERTS Diagnosing Phys: Candee Furbish MD PROCEDURE: After discussion of the risks and benefits of a TEE, an informed consent was obtained from the patient. The transesophogeal probe was passed without difficulty through the esophogus of the patient. Sedation performed by different physician. The patient was monitored while under deep sedation. Anesthestetic sedation was provided intravenously by Anesthesiology: 112mg  of Propofol. The patient developed no complications during the procedure. A direct current cardioversion was performed. IMPRESSIONS  1. Left ventricular ejection fraction, by estimation, is 30 to 35%. The left ventricle has moderately decreased function.  2. Right ventricular systolic function is normal. The right ventricular size is normal.  3. Left atrial size  was severely dilated. No left atrial/left atrial appendage thrombus was detected.  4. Right atrial size was severely dilated.  5. 3D images of mitral valve taken. The mitral valve is degenerative. Moderate mitral valve regurgitation. 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  6. The aortic valve is tricuspid. Aortic valve regurgitation is not visualized. Mild aortic valve sclerosis is present, with no evidence of aortic valve stenosis. FINDINGS  Left Ventricle: Left ventricular ejection fraction, by estimation, is 30 to 35%. The left ventricle has moderately decreased function. The left ventricular internal cavity size was normal in size. Right Ventricle: The right ventricular size is normal. No increase in right ventricular wall thickness. Right ventricular systolic function is normal. Left Atrium: Left atrial size was severely dilated. No left atrial/left atrial appendage thrombus was detected. Right Atrium: Right atrial size was severely dilated. Pericardium: There is no evidence of pericardial effusion. Mitral Valve: 3D images of mitral valve taken. The mitral valve is degenerative in appearance. 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 There is mild thickening of the mitral valve leaflet(s). There is mild calcification of the mitral valve leaflet(s). Mildly decreased mobility of the mitral valve posterior leaflet. Mild mitral annular calcification. Moderate mitral valve regurgitation. Tricuspid Valve: The tricuspid valve is normal in structure. Tricuspid valve regurgitation is mild. Aortic Valve: The aortic valve is tricuspid. Aortic valve regurgitation is not visualized. Mild aortic valve sclerosis is present, with no evidence of aortic valve stenosis. Pulmonic Valve: The pulmonic valve was not assessed. Pulmonic valve regurgitation is not visualized. Aorta: The aortic root is  normal in size and structure. IAS/Shunts: No atrial level shunt detected by color flow Doppler. Candee Furbish MD Electronically signed by Candee Furbish MD Signature Date/Time: 06/20/2020/2:19:34 PM    Final     Cardiac Studies   Echo 06/19/20: 1. Left ventricular ejection fraction, by estimation, is 35 to 40%. The  left ventricle has moderately decreased function. The left ventricle  demonstrates global hypokinesis. The left ventricular internal cavity size  was moderately dilated. Left  ventricular diastolic function could not be evaluated. There is severe  hypokinesis of  the left ventricular, entire anteroseptal wall and anterior  wall.  2. Right ventricular systolic function is low normal. The right  ventricular size is normal. Tricuspid regurgitation signal is inadequate  for assessing PA pressure.  3. Left atrial size was severely dilated.  4. Right atrial size was mildly dilated.  5. Restricted posterior leaflet with heavy calcification of annular and  posteromedial papillary muscles. The mitral valve is abnormal. Mild mitral  valve regurgitation. Moderate mitral annular calcification.  6. The aortic valve is calcified. There is moderate calcification of the  aortic valve. There is moderate thickening of the aortic valve. Aortic  valve regurgitation is not visualized.  7. The inferior vena cava is dilated in size with >50% respiratory  variability, suggesting right atrial pressure of 8 mmHg.   Patient Profile     64 y.o. male with a hx of HTN, obesity and tobacco use who is being seen today for the evaluation of Afib at the request of Dr. Dwyane Dee.  Assessment & Plan    Afib RVR: new onset, suspect developed about 2 weeks ago when his shortness of breath began. HR noted in the 130s on admission. 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 -Started on metoprolol 25 mg twice daily, will consolidate to XL on discharge -Cr is 1.3 pending today if stable ok to d/c home on entresto  -Continue Lasix 20 mg daily  HTN: See CHF meds above   Elevated Troponin: peak of 55 with low flat trend, not consistent with ACS. No reported chest pain.   Tobacco use: cessation advised  Ok to d/c home today Can note on d/c instructions no work for a week Will arrange outpatient f/u  With Dr Gilman Schmidt to titrate entresto   For questions or updates, please contact [place] Please consult [email or web address] for contact info under        Signed, Jenkins Rouge, MD  06/22/2020, 8:01 AM

## 2020-06-22 NOTE — Care Management (Signed)
1413 06-22-20 Case Manager retrieved Eliquis medication from the main pharmacy. Patient has 30 day free supply for home. Transportation en route. No further needs from Case Manager at this time. Graves-Bigelow, Ocie Cornfield, RN, BSN Case Manager

## 2020-06-22 NOTE — Discharge Summary (Addendum)
Physician Discharge Summary  Christopher Wilkins FTD:[id number] DOB: 09/05/1956 DOA: 06/18/2020  PCP: Rogers Blocker, MD  Admit date: 06/18/2020   Discharge date: 06/22/2020  Admitted From: Home.  Disposition:  Home  Recommendations for Outpatient Follow-up:  1. Follow up with PCP in 1-2 weeks. 2. Please obtain BMP/CBC in one week. 3. Advised to follow-up with cardiology as scheduled.   4. Patient's medication has been adjusted.   5. Patient is started on Entresto for low CHF.  Advice to take rest for a week. 6. Advised to take Eliquis 5 mg twice daily for atrial fibrillation. 7. He underwent TEE/ [facility] ( cardioversion) for Atrial Fibrillation.  Home Health: None. Equipment/Devices: None.  Discharge Condition: Stable CODE STATUS:Full code Diet recommendation: Heart Healthy  Brief Summary / Hospital course: 64 years old male with medical history significant for essential hypertension, tobacco use disorder, obesity presented to the ED with progressive shortness of breath for 2 weeks.  He went to see his PCP for yearly checkup and found to have a new onset A. fib with RVR on EKG. Patient was sent to the ER.  Patient also reported associated dyspnea and orthopnea. Patient was admitted for new onset A. fib with RVR,  started on Cardizem and heparin drip which dropped his blood pressure,  Cardizem drip was discontinued and transitioned to p.o. Cardizem.  Cardiology was consulted recommended to continue Eliquis for 3 weeks and outpatient follow up , Heart rate remains controlled.  Cardizem was changed to metoprolol.  Echocardiogram shows EF 35 to 40%. Cardiology decided TEE and cardioversion. Patient underwent TEE and successful cardioversion for A. fib.   S/p post TEE/DCC 06/20/20 maintaining NSR.  Given low ejection fraction, He was started on Entresto at low dose. He was observed for 24 hours after Entresto was started, renal functions remained stable.  Patient was cleared from cardiology to be discharged.   Advised patient no work for 1 week, follow-up with cardiology for up titration of Entresto.  Patient was also discharged on Eliquis given severe left atrial enlargement at risk for early recurrence for A. Fib. Excuse letter for work given to the patient.  He was managed for below problems.    Discharge Diagnoses:  Active Problems:   Atrial fibrillation with RVR (HCC)  New onset A. fib with RVR Presented with dyspnea for 2 weeks duration associated withorthopnea and nonproductive cough. Twelve-lead EKG showing A. fib rate of 137 Started on Cardizem drip in the ED, CHA2DS2-VASc score 2 with suspected acute CHF No recent head trauma, no recent bleeding, no prior history of CVA or TIA Started on heparin drip,follow results of 2D echo May need to switch to DOAC if A. fib is sustained. Consider low-dose Lopressor12.5 mg twice daily. Continue to closely monitor on telemetry Cardiology consulted, heart rate controlled. Cardizem changed to metoprolol twice daily. Continue Eliquis as previously ordered. Echocardiogram shows EF 35 to 40%.   Patient underwent TEE and successful cardioversion for A. fib.   S/p post TEE/DCC 06/20/20 maintaining NSR Given low ejection fraction,  started on Entresto at low dose.   Elevated troponin, suspect demand ischemia in the setting of A. fib with RVR Troponin S peaked at 55 No sign of acute ischemia on twelve-lead EKG.  Mild pulmonary edema, suspect secondary to A. fib with RVR Independentlyreviewedchest x-ray done on admission which shows cardiomegaly withmild increase in pulmonary vascularity suggestive of pulmonary edema BNP 478.0 Give low dose of IV Lasix20 mg x 1. Echo : EF 35 to  40% consistent with systolic CHF. Needs ACEI or ARB when renal fx better. Given low ejection fraction,  started on Entresto at low dose.  Suspected acute CHF Presented with dyspnea, orthopnea, mild pulmonary edema with cardiomegaly on chest x-ray, trace edema in  lower extremities bilaterally 2D echo LV EF 35 to 40% consistent with systolic CHF. Give 1 dose IV Lasix 20 mg  Essential hypertension Started  low-dose Lopressor Hold off home amlodipine and HCTZ Monitor vital signs  Tobacco use disorder States he uses 1 pack of cigarette per week Tobacco cessation counseled on at bedside Nicotine patch   Discharge Instructions  Discharge Instructions    Call MD for:  difficulty breathing, headache or visual disturbances   Complete by: As directed    Call MD for:  persistant dizziness or light-headedness   Complete by: As directed    Call MD for:  persistant nausea and vomiting   Complete by: As directed    Diet - low sodium heart healthy   Complete by: As directed    Diet Carb Modified   Complete by: As directed    Discharge instructions   Complete by: As directed    Advised to follow-up with primary care physician in 1 week Advised to follow-up with cardiology as scheduled.  Patient's medication has been adjusted.  Patient is started on Entresto for low CHF.  Advice to take rest for a week. Advised to take Eliquis 5 mg twice daily for atrial fibrillation.   Increase activity slowly   Complete by: As directed      Allergies as of 06/22/2020      Reactions   Codeine Nausea And Vomiting   Lisinopril Other (See Comments)   "Didn't do well in my body"   Shellfish Allergy Other (See Comments)   Can tolerate shrimp in 2021 (??)      Medication List    STOP taking these medications   amitriptyline 50 MG tablet Commonly known as: ELAVIL   amLODipine 5 MG tablet Commonly known as: NORVASC   hydrochlorothiazide 12.5 MG capsule Commonly known as: MICROZIDE   NON FORMULARY   potassium chloride 10 MEQ tablet Commonly known as: KLOR-CON   predniSONE 10 MG tablet Commonly known as: DELTASONE   ranitidine 150 MG tablet Commonly known as: ZANTAC   tamsulosin 0.4 MG Caps capsule Commonly known as: FLOMAX     TAKE these  medications   apixaban 5 MG Tabs tablet Commonly known as: ELIQUIS Take 1 tablet (5 mg total) by mouth 2 (two) times daily.   furosemide 20 MG tablet Commonly known as: LASIX Take 1 tablet (20 mg total) by mouth daily. Start taking on: June 23, 2020   metoprolol succinate 50 MG 24 hr tablet Commonly known as: TOPROL-XL Take 1 tablet (50 mg total) by mouth daily. Take with or immediately following a meal. Start taking on: June 23, 2020   sacubitril-valsartan 24-26 MG Commonly known as: ENTRESTO Take 1 tablet by mouth 2 (two) times daily.       Follow-up [place] Follow up on 07/11/2020.   Why: 9:10 for hospital follow up , you will need to get eliquis refill here for 10.00 Contact information: [place] [address] [phone]       Donato Heinz, MD Follow up in 1 week(s).   Specialties: Cardiology, Radiology Contact information: [address] [place] [address] [phone]  Allergies  Allergen Reactions  . Codeine Nausea And Vomiting  . Lisinopril Other (See Comments)    "Didn't do well in my body"  . Shellfish Allergy Other (See Comments)    Can tolerate shrimp in 2021 (??)    Consultations:  Cardiology.   Procedures/Studies: DG Chest Portable 1 View  Result Date: 06/18/2020 CLINICAL DATA:  Shortness of breath EXAM: PORTABLE CHEST 1 VIEW COMPARISON:  August 27, 2019 FINDINGS: Heart size remains enlarged. There is mild vascular congestion without overt pulmonary edema. There is no pneumothorax. No large pleural effusion. No acute osseous abnormality. IMPRESSION: Cardiomegaly with mild vascular congestion. Electronically Signed   By: Constance Holster M.D.   On: 06/18/2020 17:43   ECHOCARDIOGRAM COMPLETE  Result Date: 06/19/2020    ECHOCARDIOGRAM REPORT   Patient Name:   DAIJON WENKE Date of Exam: 06/19/2020 Medical Rec  #:  [id number]     Height:       66.0 in Accession #:    [id number]    Weight:       205.0 lb Date of Birth:  04-Apr-1956     BSA:          2.021 m Patient Age:    64 years      BP:           109/89 mmHg Patient Gender: M             HR:           88 bpm. Exam Location:  Inpatient Procedure: 2D Echo, Cardiac Doppler and Color Doppler Indications:    Atrial Fibrillation 427.31 / I48.91  History:        Patient has no prior history of Echocardiogram examinations.                 Arrythmias:Atrial Fibrillation; Risk Factors:Hypertension.  Sonographer:    Bernadene Person RDCS Referring Phys: [id number] [place]  1. Left ventricular ejection fraction, by estimation, is 35 to 40%. The left ventricle has moderately decreased function. The left ventricle demonstrates global hypokinesis. The left ventricular internal cavity size was moderately dilated. Left ventricular diastolic function could not be evaluated. There is severe hypokinesis of the left ventricular, entire anteroseptal wall and anterior wall.  2. Right ventricular systolic function is low normal. The right ventricular size is normal. Tricuspid regurgitation signal is inadequate for assessing PA pressure.  3. Left atrial size was severely dilated.  4. Right atrial size was mildly dilated.  5. Restricted posterior leaflet with heavy calcification of annular and posteromedial papillary muscles. The mitral valve is abnormal. Mild mitral valve regurgitation. Moderate mitral annular calcification.  6. The aortic valve is calcified. There is moderate calcification of the aortic valve. There is moderate thickening of the aortic valve. Aortic valve regurgitation is not visualized.  7. The inferior vena cava is dilated in size with >50% respiratory variability, suggesting right atrial pressure of 8 mmHg. Comparison(s): No prior Echocardiogram. FINDINGS  Left Ventricle: Both global and focal wall motion abnormalities as noted. Left ventricular ejection  fraction, by estimation, is 35 to 40%. The left ventricle has moderately decreased function. The left ventricle demonstrates global hypokinesis. Severe hypokinesis of the left ventricular, entire anteroseptal wall and anterior wall. The left ventricular internal cavity size was moderately dilated. There is no left ventricular hypertrophy. Left ventricular diastolic function could not be evaluated due to  atrial fibrillation. Left ventricular diastolic function could not be evaluated. Right Ventricle: The right ventricular size is  normal. Right vetricular wall thickness was not assessed. Right ventricular systolic function is low normal. Tricuspid regurgitation signal is inadequate for assessing PA pressure. Left Atrium: Left atrial size was severely dilated. Right Atrium: Right atrial size was mildly dilated. Pericardium: Trivial pericardial effusion is present. Mitral Valve: Restricted posterior leaflet with heavy calcification of annular and posteromedial papillary muscles. The mitral valve is abnormal. Severely decreased mobility of the mitral valve leaflets. Moderate mitral annular calcification. Mild mitral  valve regurgitation. Tricuspid Valve: The tricuspid valve is normal in structure. Tricuspid valve regurgitation is trivial. No evidence of tricuspid stenosis. Aortic Valve: Likely tricuspid, but significantly calcified. The aortic valve is calcified. There is moderate calcification of the aortic valve. There is moderate thickening of the aortic valve. Aortic valve regurgitation is not visualized. Pulmonic Valve: The pulmonic valve was grossly normal. Pulmonic valve regurgitation is not visualized. Aorta: The aortic root and ascending aorta are structurally normal, with no evidence of dilitation. Venous: The inferior vena cava is dilated in size with greater than 50% respiratory variability, suggesting right atrial pressure of 8 mmHg. IAS/Shunts: The atrial septum is grossly normal.  LEFT VENTRICLE PLAX 2D  LVIDd:         6.00 cm LVIDs:         4.60 cm LV PW:         1.00 cm LV IVS:        0.80 cm LVOT diam:     2.00 cm LV SV:         56 LV SV Index:   28 LVOT Area:     3.14 cm  LV Volumes (MOD) LV vol d, MOD A2C: 147.0 ml LV vol d, MOD A4C: 163.0 ml LV vol s, MOD A2C: 94.9 ml LV vol s, MOD A4C: 91.6 ml LV SV MOD A2C:     52.1 ml LV SV MOD A4C:     163.0 ml LV SV MOD BP:      61.7 ml RIGHT VENTRICLE TAPSE (M-mode): 1.3 cm LEFT ATRIUM              Index       RIGHT ATRIUM           Index LA diam:        4.50 cm  2.23 cm/m  RA Area:     18.80 cm LA Vol (A2C):   150.0 ml 74.22 ml/m RA Volume:   50.90 ml  25.19 ml/m LA Vol (A4C):   182.0 ml 90.06 ml/m LA Biplane Vol: 168.0 ml 83.13 ml/m  AORTIC VALVE LVOT Vmax:   98.25 cm/s LVOT Vmean:  71.750 cm/s LVOT VTI:    0.180 m  AORTA Ao Root diam: 3.50 cm Ao Asc diam:  2.90 cm MR Peak grad:    68.9 mmHg MR Mean grad:    42.0 mmHg   SHUNTS MR Vmax:         415.00 cm/s Systemic VTI:  0.18 m MR Vmean:        307.0 cm/s  Systemic Diam: 2.00 cm MR PISA:         0.57 cm MR PISA Eff ROA: 5 mm MR PISA Radius:  0.30 cm Buford Dresser MD Electronically signed by Buford Dresser MD Signature Date/Time: 06/19/2020/5:45:50 PM    Final    ECHO TEE  Result Date: 06/20/2020    TRANSESOPHOGEAL ECHO REPORT   Patient Name:   MEARLE DREW Date of Exam: 06/20/2020 Medical Rec #:  [id number]  Height:       66.0 in Accession #:    [id number]    Weight:       206.8 lb Date of Birth:  May 26, 1956     BSA:          2.028 m Patient Age:    50 years      BP:           110/100 mmHg Patient Gender: M             HR:           121 bpm. Exam Location:  Inpatient Procedure: Transesophageal Echo, 3D Echo, Cardiac Doppler and Color Doppler Indications:     atrial fibrillation  History:         Patient has prior history of Echocardiogram examinations, most                  recent 06/19/2020.  Sonographer:     Johny Chess Referring Phys:  [id number] B ROBERTS Diagnosing Phys: Candee Furbish MD PROCEDURE: After discussion of the risks and benefits of a TEE, an informed consent was obtained from the patient. The transesophogeal probe was passed without difficulty through the esophogus of the patient. Sedation performed by different physician. The patient was monitored while under deep sedation. Anesthestetic sedation was provided intravenously by Anesthesiology: 112mg  of Propofol. The patient developed no complications during the procedure. A direct current cardioversion was performed. IMPRESSIONS  1. Left ventricular ejection fraction, by estimation, is 30 to 35%. The left ventricle has moderately decreased function.  2. Right ventricular systolic function is normal. The right ventricular size is normal.  3. Left atrial size was severely dilated. No left atrial/left atrial appendage thrombus was detected.  4. Right atrial size was severely dilated.  5. 3D images of mitral valve taken. The mitral valve is degenerative. Moderate mitral valve regurgitation. 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  6. The aortic valve is tricuspid. Aortic valve regurgitation is not visualized. Mild aortic valve sclerosis is present, with no evidence of aortic valve stenosis. FINDINGS  Left Ventricle: Left ventricular ejection fraction, by estimation, is 30 to 35%. The left ventricle has moderately decreased function. The left ventricular internal cavity size was normal in size. Right Ventricle: The right ventricular size is normal. No increase in right ventricular wall thickness. Right ventricular systolic function is normal. Left Atrium: Left atrial size was severely dilated. No left atrial/left atrial appendage thrombus was detected. Right Atrium: Right atrial size was severely dilated. Pericardium: There is no evidence of pericardial effusion. Mitral Valve: 3D images of mitral valve taken. The mitral valve is degenerative in appearance. 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 There is mild thickening of the mitral valve leaflet(s). There is mild calcification of the mitral valve leaflet(s). Mildly decreased mobility of the mitral valve posterior leaflet. Mild mitral annular calcification. Moderate mitral valve regurgitation. Tricuspid Valve: The tricuspid valve is normal in structure. Tricuspid valve regurgitation is mild. Aortic Valve: The aortic valve is tricuspid. Aortic valve regurgitation is not visualized. Mild aortic valve sclerosis is present, with no evidence of aortic valve stenosis. Pulmonic Valve: The pulmonic valve was not assessed. Pulmonic valve regurgitation is not visualized. Aorta: The aortic root is normal in size and structure. IAS/Shunts: No atrial level shunt detected by color flow Doppler. Candee Furbish MD Electronically signed by Elta Guadeloupe  Skains MD Signature Date/Time: 06/20/2020/2:19:34 PM    Final    ( Echo, TEE, [facility])   Subjective: Patient was seen and examined at bedside,  Overnight events noted.  Patient denies any chest pain, shortness of breath. He remains in normal sinus rhythm.  Patient wants to be discharged home,  cleared from cardiology to be discharged.  Discharge Exam: Vitals:   06/22/20 0856 06/22/20 1111  BP: 115/75 106/78  Pulse: 70 67  Resp:  17  Temp:  98.5 F (36.9 C)  SpO2: 97% 99%   Vitals:   06/21/20 2013 06/22/20 0345 06/22/20 0856 06/22/20 1111  BP: (!) 124/92 121/87 115/75 106/78  Pulse: 79 78 70 67  Resp: 16 20  17   Temp: 98.2 F (36.8 C) 97.6 F (36.4 C)  98.5 F (36.9 C)  TempSrc: Oral Oral  Oral  SpO2: 98% 96% 97% 99%  Weight:  92.2 kg    Height:        General: Pt is alert, awake, not in acute distress Cardiovascular: RRR, S1/S2 +, no rubs, no gallops Respiratory: CTA bilaterally, no wheezing, no rhonchi Abdominal: Soft, NT, ND, bowel sounds + Extremities: no edema, no cyanosis    The results of  significant diagnostics from this hospitalization (including imaging, microbiology, ancillary and laboratory) are listed below for reference.     Microbiology: Recent Results (from the past 240 hour(s))  SARS Coronavirus 2 by RT PCR (hospital order, performed in [facility] hospital lab) Nasopharyngeal Nasopharyngeal Swab     Status: None   Collection Time: 06/18/20  7:13 PM   Specimen: Nasopharyngeal Swab  Result Value Ref Range Status   SARS Coronavirus 2 NEGATIVE NEGATIVE Final    Comment: (NOTE) SARS-CoV-2 target nucleic acids are NOT DETECTED.  The SARS-CoV-2 RNA is generally detectable in upper and lower respiratory specimens during the acute phase of infection. The lowest concentration of SARS-CoV-2 viral copies this assay can detect is 250 copies / mL. A negative result does not preclude SARS-CoV-2 infection and should not be used as the sole basis for treatment or other patient management decisions.  A negative result may occur with improper specimen collection / handling, submission of specimen other than nasopharyngeal swab, presence of viral mutation(s) within the areas targeted by this assay, and inadequate number of viral copies (<250 copies / mL). A negative result must be combined with clinical observations, patient history, and epidemiological information.  Fact Sheet for Patients:   StrictlyIdeas.no  Fact Sheet for Healthcare Providers: BankingDealers.co.za  This test is not yet approved or  cleared by the [place] FDA and has been authorized for detection and/or diagnosis of SARS-CoV-2 by FDA under an Emergency Use Authorization (EUA).  This EUA will remain in effect (meaning this test can be used) for the duration of the COVID-19 declaration under Section 564(b)(1) of the Act, 21 U.S.C. section 360bbb-3(b)(1), unless the authorization is terminated or revoked sooner.  Performed at [facility],  [place] [address] [place], [place] [address]      Labs: BNP (last 3 results) Recent Labs    06/18/20 1945 06/19/20 0120  BNP 444.2* 401.0*   Basic Metabolic Panel: Recent Labs  Lab 06/18/20 1723 06/19/20 0120 06/20/20 0556 06/21/20 0628 06/22/20 0654  NA 139 141 140 138 139  K 4.0 4.0 4.0 4.1 4.4  CL 106 104 106 106 108  CO2 23 27 27 23 22   GLUCOSE 100* 161* 107* 98 97  BUN 22 19 15 17  11  CREATININE 1.25* 1.35* 1.34* 1.31* 1.33*  CALCIUM 8.9 8.6* 8.8* 8.7* 8.6*  MG 2.2  --   --   --   --    Liver Function Tests: No results for input(s): AST, ALT, ALKPHOS, BILITOT, PROT, ALBUMIN in the last 168 hours. No results for input(s): LIPASE, AMYLASE in the last 168 hours. No results for input(s): AMMONIA in the last 168 hours. CBC: Recent Labs  Lab 06/18/20 1723 06/19/20 0120 06/20/20 0556 06/21/20 0628  WBC 5.6 6.5 4.9 5.7  HGB 15.4 14.4 14.8 15.1  HCT 47.0 44.9 46.1 47.7  MCV 86.9 87.5 87.5 87.4  PLT 288 277 267 256   Cardiac Enzymes: No results for input(s): CKTOTAL, CKMB, CKMBINDEX, TROPONINI in the last 168 hours. BNP: Invalid input(s): POCBNP CBG: No results for input(s): GLUCAP in the last 168 hours. D-Dimer No results for input(s): DDIMER in the last 72 hours. Hgb A1c No results for input(s): HGBA1C in the last 72 hours. Lipid Profile No results for input(s): CHOL, HDL, LDLCALC, TRIG, CHOLHDL, LDLDIRECT in the last 72 hours. Thyroid function studies No results for input(s): TSH, T4TOTAL, T3FREE, THYROIDAB in the last 72 hours.  Invalid input(s): FREET3 Anemia work up No results for input(s): VITAMINB12, FOLATE, FERRITIN, TIBC, IRON, RETICCTPCT in the last 72 hours. Urinalysis No results found for: COLORURINE, APPEARANCEUR, [place], [place], [place], [place], [place], [place], PROTEINUR, UROBILINOGEN, NITRITE, LEUKOCYTESUR Sepsis Labs Invalid input(s): PROCALCITONIN,  WBC,  LACTICIDVEN Microbiology Recent Results (from the past 240 hour(s))  SARS  Coronavirus 2 by RT PCR (hospital order, performed in [facility] hospital lab) Nasopharyngeal Nasopharyngeal Swab     Status: None   Collection Time: 06/18/20  7:13 PM   Specimen: Nasopharyngeal Swab  Result Value Ref Range Status   SARS Coronavirus 2 NEGATIVE NEGATIVE Final    Comment: (NOTE) SARS-CoV-2 target nucleic acids are NOT DETECTED.  The SARS-CoV-2 RNA is generally detectable in upper and lower respiratory specimens during the acute phase of infection. The lowest concentration of SARS-CoV-2 viral copies this assay can detect is 250 copies / mL. A negative result does not preclude SARS-CoV-2 infection and should not be used as the sole basis for treatment or other patient management decisions.  A negative result may occur with improper specimen collection / handling, submission of specimen other than nasopharyngeal swab, presence of viral mutation(s) within the areas targeted by this assay, and inadequate number of viral copies (<250 copies / mL). A negative result must be combined with clinical observations, patient history, and epidemiological information.  Fact Sheet for Patients:   StrictlyIdeas.no  Fact Sheet for Healthcare Providers: BankingDealers.co.za  This test is not yet approved or  cleared by the [place] FDA and has been authorized for detection and/or diagnosis of SARS-CoV-2 by FDA under an Emergency Use Authorization (EUA).  This EUA will remain in effect (meaning this test can be used) for the duration of the COVID-19 declaration under Section 564(b)(1) of the Act, 21 U.S.C. section 360bbb-3(b)(1), unless the authorization is terminated or revoked sooner.  Performed at [facility], [place] [address], [place], [place] [address]      Time coordinating discharge: Over 30 minutes  SIGNED:   Shawna Clamp, MD  [facility] 06/22/2020, 1:23 PM Pager   If 7PM-7AM, please contact  night-coverage [email or web address]

## 2020-06-23 ENCOUNTER — Telehealth: Payer: Self-pay | Admitting: Cardiology

## 2020-06-23 NOTE — Telephone Encounter (Signed)
Attempted to contact pt. Unable to leave message as mailbox is full.  °

## 2020-06-23 NOTE — Telephone Encounter (Signed)
    Pt is schedule for TOC with Kerin Ransom on 07/02/20 at 2:15 pm. Per Dr. Kyla Balzarine staff message   Josue Hector, MD  P Cv Div Nl Scheduling; Donato Heinz, MD Needs [facility] outpatient f/u northline Schuman patient CHF/Afib post TEE [facility] needs Entresto titrateed and f/u BMET

## 2020-06-24 ENCOUNTER — Encounter (HOSPITAL_COMMUNITY): Payer: Self-pay | Admitting: Cardiology

## 2020-06-24 NOTE — Telephone Encounter (Signed)
Called patient to discuss TOC questions.  Patient did not answer, unable to leave voicemail.

## 2020-06-24 NOTE — Anesthesia Postprocedure Evaluation (Signed)
Anesthesia Post Note  Patient: Christopher Wilkins  Procedure(s) Performed: TRANSESOPHAGEAL ECHOCARDIOGRAM (TEE) (N/A ) CARDIOVERSION (N/A )     Patient location during evaluation: Endoscopy Anesthesia Type: General Level of consciousness: awake and alert Pain management: pain level controlled Vital Signs Assessment: post-procedure vital signs reviewed and stable Respiratory status: spontaneous breathing, nonlabored ventilation, respiratory function stable and patient connected to nasal cannula oxygen Cardiovascular status: stable Postop Assessment: no apparent nausea or vomiting Anesthetic complications: no   No complications documented.  Last Vitals:  Vitals:   06/22/20 0856 06/22/20 1111  BP: 115/75 106/78  Pulse: 70 67  Resp:  17  Temp:  36.9 C  SpO2: 97% 99%    Last Pain:  Vitals:   06/22/20 1111  TempSrc: Oral  PainSc:                  Julyssa Kyer

## 2020-06-26 NOTE — Telephone Encounter (Signed)
Patient contacted regarding discharge from [facility] on 9/12.  Patient understands to follow up with provider Kerin Ransom, PA on 07/02/20 at 2:15 pm at [facility] Patient understands discharge instructions? Yes Patient understands medications and regiment? Yes Patient understands to bring all medications to this visit? yes  Pt state he was unable to pick up prescription for Eliquis and Entresto due to cost. Samples for Elquis and 30 day free card for Elquis and Entresto placed up front for pick up. Nurse also attached patient assistance form for both medications.

## 2020-07-02 ENCOUNTER — Other Ambulatory Visit: Payer: Self-pay

## 2020-07-02 ENCOUNTER — Encounter: Payer: Self-pay | Admitting: Cardiology

## 2020-07-02 ENCOUNTER — Ambulatory Visit (INDEPENDENT_AMBULATORY_CARE_PROVIDER_SITE_OTHER): Payer: Medicare Other | Admitting: Cardiology

## 2020-07-02 DIAGNOSIS — I429 Cardiomyopathy, unspecified: Secondary | ICD-10-CM | POA: Insufficient documentation

## 2020-07-02 DIAGNOSIS — I509 Heart failure, unspecified: Secondary | ICD-10-CM | POA: Insufficient documentation

## 2020-07-02 DIAGNOSIS — I4891 Unspecified atrial fibrillation: Secondary | ICD-10-CM | POA: Diagnosis not present

## 2020-07-02 DIAGNOSIS — T783XXD Angioneurotic edema, subsequent encounter: Secondary | ICD-10-CM

## 2020-07-02 DIAGNOSIS — Z7901 Long term (current) use of anticoagulants: Secondary | ICD-10-CM

## 2020-07-02 DIAGNOSIS — I10 Essential (primary) hypertension: Secondary | ICD-10-CM | POA: Insufficient documentation

## 2020-07-02 NOTE — Patient Instructions (Signed)
Medication Instructions:  Stop Entresto,and Lasix *If you need a refill on your cardiac medications before your next appointment, please call your pharmacy*   Lab Work: BMP If you have labs (blood work) drawn today and your tests are completely normal, you will receive your results only by: Marland Kitchen MyChart Message (if you have MyChart) OR . A paper copy in the mail If you have any lab test that is abnormal or we need to change your treatment, we will call you to review the results.   Testing/Procedures: None   Follow-Up: At [facility], you and your health needs are our priority.  As part of our continuing mission to provide you with exceptional heart care, we have created designated Provider Care Teams.  These Care Teams include your primary Cardiologist (physician) and Advanced Practice Providers (APPs -  Physician Assistants and Nurse Practitioners) who all work together to provide you with the care you need, when you need it.  We recommend signing up for the patient portal called "MyChart".  Sign up information is provided on this After Visit Summary.  MyChart is used to connect with patients for Virtual Visits (Telemedicine).  Patients are able to view lab/test results, encounter notes, upcoming appointments, etc.  Non-urgent messages can be sent to your provider as well.   To learn more about what you can do with MyChart, go to [email or web address].    Your next appointment:   6 week(s)  The format for your next appointment:   In Person  Provider:   Oswaldo Milian, MD   Other Instructions Please call office for Eliquis samples

## 2020-07-02 NOTE — Progress Notes (Signed)
Cardiology Office Note:    Date:  07/02/2020   ID:  Christopher Wilkins, Christopher Wilkins 01-Dec-1955, MRN [id number]  PCP:  Rogers Blocker, MD  Cardiologist:  No primary care provider on file.  Electrophysiologist:  None   Referring MD: Rogers Blocker, MD   No chief complaint on file. fatigue- DOE  History of Present Illness:    Christopher Wilkins is a 64 y.o. male with a hx of hypertension suspected angioedema with near respiratory failure July 2020.  The patient presented to cardiology 06/19/2020 when he came to the emergency room with shortness of breath and was found to be in heart failure and AF with RVR.  Patient was admitted and diuresed.  He underwent TEE cardioversion 06/20/2020.  His CHA DS 2-VASC score was 2 and he was placed on Eliquis.  Echocardiogram showed an ejection fraction of 35 to 40% with severe left atrial enlargement.  He was discharged 06/22/2020 on Entresto 24 6, Toprol 50 mg, Lasix 20 mg and Eliquis 5 mg twice daily.  He was instructed to stop his amlodipine and his hydrochlorothiazide and potassium.  He is in the office today for follow-up from his recent hospitalization.  He is back to work, he is a Biomedical scientist at [employer].  He does have some fatigue and feels like he is short of breath walking in from the parking lot.  He does not have orthopnea or swelling.  He asked about a temporary handicap parking sticker and I provided this.  I reviewed his medications with him in detail.  He did get samples of Eliquis.  He did not fill the prescription for Entresto since it was $800.  He also continue to take his amlodipine and HCTZ.  Past Medical History:  Diagnosis Date  . Hypertension     Past Surgical History:  Procedure Laterality Date  . CARDIOVERSION N/A 06/20/2020   Procedure: CARDIOVERSION;  Surgeon: Jerline Pain, MD;  Location: [facility] ENDOSCOPY;  Service: Cardiovascular;  Laterality: N/A;  . CHOLECYSTECTOMY    . OTHER SURGICAL HISTORY     "Shot a couple of times"  . TEE WITHOUT CARDIOVERSION N/A  06/20/2020   Procedure: TRANSESOPHAGEAL ECHOCARDIOGRAM (TEE);  Surgeon: Jerline Pain, MD;  Location: [facility] ENDOSCOPY;  Service: Cardiovascular;  Laterality: N/A;    Current Medications: Current Meds  Medication Sig  . amLODipine (NORVASC) 5 MG tablet Take 5 mg by mouth daily. Take 1 tablet Daily  . apixaban (ELIQUIS) 5 MG TABS tablet Take 1 tablet (5 mg total) by mouth 2 (two) times daily.  . hydrochlorothiazide (MICROZIDE) 12.5 MG capsule Take 12.5 mg by mouth daily. Take 1 Capsule Daily  . metoprolol succinate (TOPROL-XL) 50 MG 24 hr tablet Take 1 tablet (50 mg total) by mouth daily. Take with or immediately following a meal.  . [DISCONTINUED] furosemide (LASIX) 20 MG tablet Take 1 tablet (20 mg total) by mouth daily.     Allergies:   Codeine, Lisinopril, and Shellfish allergy   Social History   Socioeconomic History  . Marital status: Single    Spouse name: Not on file  . Number of children: Not on file  . Years of education: Not on file  . Highest education level: Not on file  Occupational History  . Not on file  Tobacco Use  . Smoking status: Current Every Day Smoker    Packs/day: 0.25    Types: Cigarettes  . Smokeless tobacco: Never Used  Substance and Sexual Activity  . Alcohol use: Yes  Alcohol/week: 0.0 standard drinks    Comment: occasionally; once every 2 weeks  . Drug use: No  . Sexual activity: Not on file  Other Topics Concern  . Not on file  Social History Narrative  . Not on file   Social Determinants of Health   Financial Resource Strain:   . Difficulty of Paying Living Expenses: Not on file  Food Insecurity:   . Worried About Charity fundraiser in the Last Year: Not on file  . Ran Out of Food in the Last Year: Not on file  Transportation Needs:   . Lack of Transportation (Medical): Not on file  . Lack of Transportation (Non-Medical): Not on file  Physical Activity:   . Days of Exercise per Week: Not on file  . Minutes of Exercise per Session:  Not on file  Stress:   . Feeling of Stress : Not on file  Social Connections:   . Frequency of Communication with Friends and Family: Not on file  . Frequency of Social Gatherings with Friends and Family: Not on file  . Attends Religious Services: Not on file  . Active Member of Clubs or Organizations: Not on file  . Attends Archivist Meetings: Not on file  . Marital Status: Not on file     Family History: The patient's family history is negative for Colon cancer.  ROS:   Please see the history of present illness.     All other systems reviewed and are negative.  EKGs/Labs/Other Studies Reviewed:    The following studies were reviewed today: Echo 06/19/2020- IMPRESSIONS    1. Left ventricular ejection fraction, by estimation, is 35 to 40%. The  left ventricle has moderately decreased function. The left ventricle  demonstrates global hypokinesis. The left ventricular internal cavity size  was moderately dilated. Left  ventricular diastolic function could not be evaluated. There is severe  hypokinesis of the left ventricular, entire anteroseptal wall and anterior  wall.  2. Right ventricular systolic function is low normal. The right  ventricular size is normal. Tricuspid regurgitation signal is inadequate  for assessing PA pressure.  3. Left atrial size was severely dilated.  4. Right atrial size was mildly dilated.  5. Restricted posterior leaflet with heavy calcification of annular and  posteromedial papillary muscles. The mitral valve is abnormal. Mild mitral  valve regurgitation. Moderate mitral annular calcification.  6. The aortic valve is calcified. There is moderate calcification of the  aortic valve. There is moderate thickening of the aortic valve. Aortic  valve regurgitation is not visualized.  7. The inferior vena cava is dilated in size with >50% respiratory  variability, suggesting right atrial pressure of 8 mmHg.   EKG:  EKG is ordered  today.  The ekg ordered today demonstrates NSR,  HR 76, LVH with repol changes  Recent Labs: 06/18/2020: Magnesium 2.2 06/19/2020: B Natriuretic Peptide 478.8; TSH 1.467 06/21/2020: Hemoglobin 15.1; Platelets 256 06/22/2020: BUN 11; Creatinine, Ser 1.33; Potassium 4.4; Sodium 139  Recent Lipid Panel    Component Value Date/Time   CHOL 183 06/19/2020 0120   TRIG 45 06/19/2020 0120   HDL 58 06/19/2020 0120   CHOLHDL 3.2 06/19/2020 0120   VLDL 9 06/19/2020 0120   LDLCALC 116 (H) 06/19/2020 0120    Physical Exam:    VS:  BP 122/80   Pulse 88   Ht 5\' 6"  (1.676 m)   Wt 208 lb 6.4 oz (94.5 kg)   SpO2 97%   BMI 33.64  kg/m     Wt Readings from Last 3 Encounters:  07/02/20 208 lb 6.4 oz (94.5 kg)  06/22/20 203 lb 3.2 oz (92.2 kg)  04/20/19 193 lb 2 oz (87.6 kg)     GEN:  Well nourished, well developed AA male,  in no acute distress HEENT: Normal NECK: No JVD; No carotid bruits CARDIAC: RRR, no murmurs, rubs, gallops RESPIRATORY:  Clear to auscultation without rales, wheezing or rhonchi  ABDOMEN: Soft, non-tender, non-distended MUSCULOSKELETAL:  No edema; No deformity  SKIN: Warm and dry NEUROLOGIC:  Alert and oriented x 3 PSYCHIATRIC:  Normal affect   ASSESSMENT:    Atrial fibrillation with RVR (HCC) New onset 06/18/2020- TEE CV 06/20/2020 NSR today  Acute CHF ([place]) On admission 06/18/2020-compensated on exam today  Cardiomyopathy ([place]) Presumed secondary to AF with RVR.  EF 35-40%.  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  Eliquis added 06/18/2020   Essential hypertension Controlled- continue Amlodipine  PLAN:    Stop lasix- continue HCTZ.  I would not use Entresto with his history of significant angioedema in July 2020.  Check BMP today as he has been on two diuretics and has a history of past AKI.  F/U Dr Gardiner Rhyme in 6  weeks.   Medication Adjustments/Labs and Tests Ordered: Current medicines are reviewed at length with the patient today.  Concerns regarding medicines are outlined above.  Orders Placed This Encounter  Procedures  . Basic metabolic panel  . EKG 12-Lead   No orders of the defined types were placed in this encounter.   Patient Instructions  Medication Instructions:  Stop Entresto,and Lasix *If you need a refill on your cardiac medications before your next appointment, please call your pharmacy*   Lab Work: BMP If you have labs (blood work) drawn today and your tests are completely normal, you will receive your results only by: Marland Kitchen MyChart Message (if you have MyChart) OR . A paper copy in the mail If you have any lab test that is abnormal or we need to change your treatment, we will call you to review the results.   Testing/Procedures: None   Follow-Up: At [facility], you and your health needs are our priority.  As part of our continuing mission to provide you with exceptional heart care, we have created designated Provider Care Teams.  These Care Teams include your primary Cardiologist (physician) and Advanced Practice Providers (APPs -  Physician Assistants and Nurse Practitioners) who all work together to provide you with the care you need, when you need it.  We recommend signing up for the patient portal called "MyChart".  Sign up information is provided on this After Visit Summary.  MyChart is used to connect with patients for Virtual Visits (Telemedicine).  Patients are able to view lab/test results, encounter notes, upcoming appointments, etc.  Non-urgent messages can be sent to your provider as well.   To learn more about what you can do with MyChart, go to [email or web address].    Your next appointment:   6 week(s)  The format for your next appointment:   In Person  Provider:   Oswaldo Milian, MD   Other Instructions Please call office for Eliquis  samples    Signed, Kerin Ransom, PA-C  07/02/2020 2:41 PM    [place]

## 2020-07-02 NOTE — Assessment & Plan Note (Signed)
On admission 06/18/2020-compensated on exam today

## 2020-07-02 NOTE — Assessment & Plan Note (Signed)
Presumed secondary to AF with RVR.  EF 35-40%.  Entresto added at DC but he never filled ($)

## 2020-07-02 NOTE — Assessment & Plan Note (Signed)
History of recurrent angioedema with ACE- hospitalized July 2020 Would not use Entresto

## 2020-07-02 NOTE — Assessment & Plan Note (Signed)
Controlled- continue Amlodipine

## 2020-07-02 NOTE — Assessment & Plan Note (Signed)
New onset 06/18/2020- TEE CV 06/20/2020 NSR today

## 2020-07-02 NOTE — Assessment & Plan Note (Signed)
CHADS2 VASc= 2.  Eliquis added 06/18/2020

## 2020-07-03 LAB — BASIC METABOLIC PANEL
BUN/Creatinine Ratio: 16 (ref 10–24)
BUN: 22 mg/dL (ref 8–27)
CO2: 23 mmol/L (ref 20–29)
Calcium: 9 mg/dL (ref 8.6–10.2)
Chloride: 103 mmol/L (ref 96–106)
Creatinine, Ser: 1.37 mg/dL — ABNORMAL HIGH (ref 0.76–1.27)
GFR calc Af Amer: 63 mL/min/{1.73_m2} (ref >59)
GFR calc non Af Amer: 54 mL/min/{1.73_m2} — ABNORMAL LOW (ref >59)
Glucose: 88 mg/dL (ref 65–99)
Potassium: 4 mmol/L (ref 3.5–5.2)
Sodium: 143 mmol/L (ref 134–144)

## 2020-07-09 ENCOUNTER — Telehealth: Payer: Self-pay | Admitting: Cardiology

## 2020-07-09 NOTE — Telephone Encounter (Signed)
Spoke with pt and reviewed results.  Pt appreciative for call.

## 2020-07-09 NOTE — Telephone Encounter (Signed)
  Patient is returning call for lab results 

## 2020-07-11 ENCOUNTER — Other Ambulatory Visit: Payer: Self-pay

## 2020-07-11 ENCOUNTER — Other Ambulatory Visit: Payer: Self-pay | Admitting: Family Medicine

## 2020-07-11 ENCOUNTER — Ambulatory Visit: Payer: Medicare Other | Attending: Family Medicine | Admitting: Family Medicine

## 2020-07-11 VITALS — BP 127/72 | HR 75 | Temp 98.1°F | Ht 66.0 in | Wt 208.8 lb

## 2020-07-11 DIAGNOSIS — N289 Disorder of kidney and ureter, unspecified: Secondary | ICD-10-CM | POA: Diagnosis not present

## 2020-07-11 DIAGNOSIS — I4891 Unspecified atrial fibrillation: Secondary | ICD-10-CM | POA: Diagnosis not present

## 2020-07-11 DIAGNOSIS — I509 Heart failure, unspecified: Secondary | ICD-10-CM

## 2020-07-11 DIAGNOSIS — Z23 Encounter for immunization: Secondary | ICD-10-CM

## 2020-07-11 DIAGNOSIS — Z720 Tobacco use: Secondary | ICD-10-CM

## 2020-07-11 DIAGNOSIS — I429 Cardiomyopathy, unspecified: Secondary | ICD-10-CM | POA: Diagnosis not present

## 2020-07-11 DIAGNOSIS — Z789 Other specified health status: Secondary | ICD-10-CM

## 2020-07-11 DIAGNOSIS — Z7689 Persons encountering health services in other specified circumstances: Secondary | ICD-10-CM

## 2020-07-11 DIAGNOSIS — Z7901 Long term (current) use of anticoagulants: Secondary | ICD-10-CM

## 2020-07-11 DIAGNOSIS — Z09 Encounter for follow-up examination after completed treatment for conditions other than malignant neoplasm: Secondary | ICD-10-CM

## 2020-07-11 MED ORDER — METOPROLOL SUCCINATE ER 50 MG PO TB24: 50.0000 mg | ORAL_TABLET | Freq: Every day | ORAL | 3 refills | Status: DC

## 2020-07-11 MED ORDER — APIXABAN 5 MG PO TABS: 5.0000 mg | ORAL_TABLET | Freq: Two times a day (BID) | ORAL | 3 refills | Status: DC

## 2020-07-11 MED FILL — !ELIQUIS 5MG TABLET: 5 | 30 days supply | Qty: 60 | Fill #0

## 2020-07-11 NOTE — Patient Instructions (Signed)
Heart Failure, Diagnosis  Heart failure means that your heart is not able to pump blood in the right way. This makes it hard for your body to work well. Heart failure is usually a long-term (chronic) condition. You must take good care of yourself and follow your treatment plan from your doctor. What are the causes? This condition may be caused by:  High blood pressure.  Build up of cholesterol and fat in the arteries.  Heart attack. This injures the heart muscle.  Heart valves that do not open and close properly.  Damage of the heart muscle. This is also called cardiomyopathy.  Lung disease.  Abnormal heart rhythms. What increases the risk? The risk of heart failure goes up as a person ages. This condition is also more likely to develop in people who:  Are overweight.  Are male.  Smoke or chew tobacco.  Abuse alcohol or illegal drugs.  Have taken medicines that can damage the heart.  Have diabetes.  Have abnormal heart rhythms.  Have thyroid problems.  Have low blood counts (anemia). What are the signs or symptoms? Symptoms of this condition include:  Shortness of breath.  Coughing.  Swelling of the feet, ankles, legs, or belly.  Losing weight for no reason.  Trouble breathing.  Waking from sleep because of the need to sit up and get more air.  Rapid heartbeat.  Being very tired.  Feeling dizzy, or feeling like you may pass out (faint).  Having no desire to eat.  Feeling like you may vomit (nauseous).  Peeing (urinating) more at night.  Feeling confused. How is this treated?     This condition may be treated with:  Medicines. These can be given to treat blood pressure and to make the heart muscles stronger.  Changes in your daily life. These may include eating a healthy diet, staying at a healthy body weight, quitting tobacco and illegal drug use, or doing exercises.  Surgery. Surgery can be done to open blocked valves, or to put devices in  the heart, such as pacemakers.  A donor heart (heart transplant). You will receive a healthy heart from a donor. Follow these instructions at home:  Treat other conditions as told by your doctor. These may include high blood pressure, diabetes, thyroid disease, or abnormal heart rhythms.  Learn as much as you can about heart failure.  Get support as you need it.  Keep all follow-up visits as told by your doctor. This is important. Summary  Heart failure means that your heart is not able to pump blood in the right way.  This condition is caused by high blood pressure, heart attack, or damage of the heart muscle.  Symptoms of this condition include shortness of breath and swelling of the feet, ankles, legs, or belly. You may also feel very tired or feel like you may vomit.  You may be treated with medicines, surgery, or changes in your daily life.  Treat other health conditions as told by your doctor. This information is not intended to replace advice given to you by your health care provider. Make sure you discuss any questions you have with your health care provider. Document Revised: 12/15/2018 Document Reviewed: 12/15/2018 Elsevier Patient Education  Christopher Wilkins.  Heart Failure Action Plan A heart failure action plan helps you understand what to do when you have symptoms of heart failure. Follow the plan that was created by you and your health care provider. Review your plan each time you visit your  health care provider. Red zone These signs and symptoms mean you should get medical help right away:  You have trouble breathing when resting.  You have a dry cough that is getting worse.  You have swelling or pain in your legs or abdomen that is getting worse.  You suddenly gain more than 2-3 lb (0.9-1.4 kg) in a day, or more than 5 lb (2.3 kg) in one week. This amount may be more or less depending on your condition.  You have trouble staying awake or you feel  confused.  You have chest pain.  You do not have an appetite.  You pass out. If you experience any of these symptoms:  Call your local emergency services (911 in the [place] right away or seek help at the emergency department of the nearest hospital. Yellow zone These signs and symptoms mean your condition may be getting worse and you should make some changes:  You have trouble breathing when you are active or you need to sleep with extra pillows.  You have swelling in your legs or abdomen.  You gain 2-3 lb (0.9-1.4 kg) in one day, or 5 lb (2.3 kg) in one week. This amount may be more or less depending on your condition.  You get tired easily.  You have trouble sleeping.  You have a dry cough. If you experience any of these symptoms:  Contact your health care provider within the next day.  Your health care provider may adjust your medicines. Green zone These signs mean you are doing well and can continue what you are doing:  You do not have shortness of breath.  You have very little swelling or no new swelling.  Your weight is stable (no gain or loss).  You have a normal activity level.  You do not have chest pain or any other new symptoms. Follow these instructions at home:  Take over-the-counter and prescription medicines only as told by your health care provider.  Weigh yourself daily. Your target weight is __________ lb (__________ kg). ? Call your health care provider if you gain more than __________ lb (__________ kg) in a day, or more than __________ lb (__________ kg) in one week.  Eat a heart-healthy diet. Work with a diet and nutrition specialist (dietitian) to create an eating plan that is best for you.  Keep all follow-up visits as told by your health care provider. This is important. Where to find more information  American Heart Association: www.heart.org Summary  Follow the action plan that was created by you and your health care provider.  Get  help right away if you have any symptoms in the Red zone. This information is not intended to replace advice given to you by your health care provider. Make sure you discuss any questions you have with your health care provider. Document Revised: 09/09/2017 Document Reviewed: 11/06/2016 Elsevier Patient Education  2020 [employer].

## 2020-07-11 NOTE — Progress Notes (Signed)
Patient ID: Christopher Wilkins, male    DOB: Aug 12, 1956  MRN: [id number]   SUBJECTIVE:  Christopher Wilkins is a 64 y.o. male who presents for hospital f/u. Where: [facility] When: 06/18/2020-06/22/2020 Primary Dx: New onset of atrial fibrillation with RVR, suspected acute CHF; essential Hypertension, tobacco use disorder  HPI:        64 year old male new to the practice who presents to establish care status post hospitalization from 06/18/2020 through 06/22/2020 after being seen by his doctor for increased shortness of breath for about 2 weeks prior and he was found to have new onset atrial fibrillation with rapid ventricular rate for which he was sent to the ED for further evaluation and admitted.       He reports that he feels better since his hospitalization. He has had no chest pain and no palpitations. He does not have shortness of breath unless he is really exerting himself. He has managed to decrease his tobacco use down to 5 cigarettes and some days he does not smoke at all. He has felt fatigued and has some anxiety as he is currently also taking care of an elderly family member who recently had a stroke. 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         He is taking the Eliquis but it is very expensive. He would like to know if there is something that would help with the cost. He is currently taking all of his medications. He has had no unusual bruising or bleeding. He reports no falls or injuries.    Patient Active Problem List   Diagnosis Date Noted  . Acute CHF ([place]) 07/02/2020  . Cardiomyopathy ([place]) 07/02/2020  . Anticoagulated 07/02/2020  . Essential hypertension 07/02/2020  . Atrial fibrillation with RVR ([place]) 06/18/2020  . Angioedema 04/19/2019  . AKI (acute kidney injury) [facility])      Current Outpatient Medications on File Prior to Visit  Medication Sig  Dispense Refill  . amLODipine (NORVASC) 5 MG tablet Take 5 mg by mouth daily. Take 1 tablet Daily    . hydrochlorothiazide (MICROZIDE) 12.5 MG capsule Take 12.5 mg by mouth daily. Take 1 Capsule Daily     No current facility-administered medications on file prior to visit.    Allergies  Allergen Reactions  . Codeine Nausea And Vomiting  . Lisinopril Other (See Comments)    "Didn't do well in my body"  . Shellfish Allergy Other (See Comments)    Can tolerate shrimp in 2021 (??)    Social History   Tobacco Use  . Smoking status: Current Every Day Smoker    Packs/day: 0.25    Types: Cigarettes  . Smokeless tobacco: Never Used  Substance Use Topics  . Alcohol use: Yes    Alcohol/week: 0.0 standard drinks    Comment: occasionally; once every 2 weeks  . Drug use: No     Family History  Problem Relation Age of Onset  . Colon cancer Neg Hx     Past Surgical History:  Procedure Laterality Date  . CARDIOVERSION N/A 06/20/2020   Procedure: CARDIOVERSION;  Surgeon: Jerline Pain, MD;  Location: [facility] ENDOSCOPY;  Service: Cardiovascular;  Laterality: N/A;  . CHOLECYSTECTOMY    . OTHER SURGICAL HISTORY     "Shot a couple of times"  . TEE WITHOUT CARDIOVERSION N/A 06/20/2020   Procedure: TRANSESOPHAGEAL  ECHOCARDIOGRAM (TEE);  Surgeon: Jerline Pain, MD;  Location: [facility] ENDOSCOPY;  Service: Cardiovascular;  Laterality: N/A;    ROS: Review of Systems  Constitutional: Positive for fatigue (just got off from workk at 5:30 am). Negative for chills and fever.  HENT: Negative for sore throat and trouble swallowing.   Eyes: Negative for photophobia and visual disturbance.  Respiratory: Positive for shortness of breath (with exertion). Negative for cough.   Cardiovascular: Negative for chest pain, palpitations and leg swelling.  Gastrointestinal: Negative for abdominal pain, constipation, diarrhea and nausea.  Endocrine: Negative for polydipsia, polyphagia and polyuria.  Genitourinary:  Negative for dysuria and frequency.  Musculoskeletal: Negative for arthralgias and back pain.  Skin: Negative for rash and wound.  Neurological: Negative for dizziness and headaches.  Hematological: Negative for adenopathy. Does not bruise/bleed easily.  Psychiatric/Behavioral: Negative for suicidal ideas. The patient is not nervous/anxious.        He reports some anxiety regarding being the caregiver for his elderly relative     PHYSICAL EXAM: BP 127/72 (BP Location: Left Arm, Patient Position: Sitting)   Pulse 75   Temp 98.1 F (36.7 C)   Ht 5\' 6"  (1.676 m)   Wt 208 lb 12.8 oz (94.7 kg)   SpO2 95%   BMI 33.70 kg/m    Physical Exam Vitals and nursing note reviewed.  Constitutional:      General: He is not in acute distress.    Appearance: Normal appearance. He is obese.  Neck:     Vascular: No carotid bruit.  Cardiovascular:     Rate and Rhythm: Normal rate and regular rhythm.     Comments: Regular rhythm with ectopics Pulmonary:     Effort: Pulmonary effort is normal.     Breath sounds: Normal breath sounds.  Abdominal:     Palpations: Abdomen is soft.     Tenderness: There is no abdominal tenderness. There is no right CVA tenderness, left CVA tenderness, guarding or rebound.  Musculoskeletal:     Cervical back: Normal range of motion and neck supple.     Right lower leg: No edema.     Left lower leg: No edema.     Comments: No significant LE edema but trace right pretibial edema  Lymphadenopathy:     Cervical: No cervical adenopathy.  Skin:    General: Skin is warm and dry.  Neurological:     General: No focal deficit present.     Mental Status: He is alert and oriented to person, place, and time.     Cranial Nerves: No cranial nerve deficit.  Psychiatric:        Mood and Affect: Mood normal.        Behavior: Behavior normal.       ASSESSMENT AND PLAN: 1. Atrial fibrillation with RVR ([place]); 6. Hospital discharge follow-up; 9. Encounter to establish  care Patient is status post hospitalization for new onset afib and new onset CHF. Recent hospital records and his cardiology follow-up note were reviewed and discussed with patient. Discussed atrial fibrillation and the need for anticoagulant medication to reduce the risk of embolic stroke as patient with left atrial enlargement on imaging during hospitalization which increases his risk for recurrent episodes of atrial fibrillation despite his cardioversion to normal rhythm during his hospitalization. RX for Eliquis sent to this pharmacy to see if patient is eligible for a patient assistance program due to the cost. Discussed risk of increased bleeding with use of Eliquis as patient works as  a Biomedical scientist and also discussed with patient that if he has a fall or hits his head then he needs to be seen at the ED and have a head CT due to the increased risk of a brain bleed since he is on blood thinning medication. He will have CBC in follow-up of use of blood thinning medication to look for anemia or low platelet count. Heart rate is controlled and refill given for metoprolol.  - CBC - apixaban (ELIQUIS) 5 MG TABS tablet; Take 1 tablet (5 mg total) by mouth 2 (two) times daily.  Dispense: 180 tablet; Refill: 3 - metoprolol succinate (TOPROL-XL) 50 MG 24 hr tablet; Take 1 tablet (50 mg total) by mouth daily. Take with or immediately following a meal.  Dispense: 90 tablet; Refill: 3  2. Cardiomyopathy, unspecified type ([place]) 3. Chronic congestive heart failure, unspecified heart failure type ([place]); Essential Hypertension Followed by cardiology and recent note reviewed. Also discussed CHF signs and symptoms with patient and importance of checking his weight daily and also letting one of his doctors know if he is experiencing increase in his weight, increased shortness of breath or any other concerns. Discussed regular exercise such as walking before or after work to help with heart health as well as weight loss.  -  Basic Metabolic Panel  4. Renal insufficiency Patient with Cr of 1.37 with normal GFR on BMP done on 07/02/2020. Will obtain BMP to recheck creatinine and electrolytes.  Discussed importance of controlling blood pressure and avoiding NSAID's not only due to renal insufficiency but also because he is on blood thinning medication.  - Basic Metabolic Panel  5. Anticoagulant long-term use CBC in follow-up of use of anticoagulant to look for anemia or low platelet count which may require additional follow-up.  - CBC - apixaban (ELIQUIS) 5 MG TABS tablet; Take 1 tablet (5 mg total) by mouth 2 (two) times daily.  Dispense: 180 tablet; Refill: 3  7. 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 Social Work consult placed and I provided the number for [facility] to the patient.  - Ambulatory referral to Social Work  8. Need for immunization against influenza Patient agreed to have influenza immunization and educational material on the immunization also provided at today's visit.   10. Tobacco use He reports that he is down to 5 cigarettes per day, down from 2 ppd in the past and intends to quit completely.   - Health Maintenance: He reports that he received the one dose [employer] vaccine. He declines fecal occult blood testing or referral for colonoscopy.    Future Appointments  Date Time Provider [place]  08/19/2020  4:00 PM Donato Heinz, MD [facility]     Antony Blackbird, MD, Rosalita Chessman

## 2020-07-12 LAB — BASIC METABOLIC PANEL WITH GFR
BUN/Creatinine Ratio: 16 (ref 10–24)
BUN: 18 mg/dL (ref 8–27)
CO2: 26 mmol/L (ref 20–29)
Calcium: 9 mg/dL (ref 8.6–10.2)
Chloride: 105 mmol/L (ref 96–106)
Creatinine, Ser: 1.12 mg/dL (ref 0.76–1.27)
GFR calc Af Amer: 79 mL/min/1.73
GFR calc non Af Amer: 69 mL/min/1.73
Glucose: 67 mg/dL (ref 65–99)
Potassium: 4.3 mmol/L (ref 3.5–5.2)
Sodium: 142 mmol/L (ref 134–144)

## 2020-07-12 LAB — CBC
Hematocrit: 48.3 % (ref 37.5–51.0)
Hemoglobin: 15.4 g/dL (ref 13.0–17.7)
MCH: 28 pg (ref 26.6–33.0)
MCHC: 31.9 g/dL (ref 31.5–35.7)
MCV: 88 fL (ref 79–97)
Platelets: 258 x10E3/uL (ref 150–450)
RBC: 5.5 x10E6/uL (ref 4.14–5.80)
RDW: 13.5 % (ref 11.6–15.4)
WBC: 5.6 x10E3/uL (ref 3.4–10.8)

## 2020-07-13 ENCOUNTER — Encounter: Payer: Self-pay | Admitting: Family Medicine

## 2020-07-25 ENCOUNTER — Telehealth: Payer: Self-pay | Admitting: Licensed Clinical Social Worker

## 2020-07-25 NOTE — Telephone Encounter (Signed)
Call placed to patient regarding [facility] referral. Phone continued to ring and there was no option for LCSW to leave message requesting a return call.

## 2020-08-03 ENCOUNTER — Ambulatory Visit (HOSPITAL_COMMUNITY)
Admission: EM | Admit: 2020-08-03 | Discharge: 2020-08-03 | Disposition: A | Discharge status: Nursing Home (Includes ICF) | Payer: Medicare Other | Attending: Family Medicine | Admitting: Family Medicine

## 2020-08-03 ENCOUNTER — Encounter (HOSPITAL_COMMUNITY): Payer: Self-pay | Admitting: *Deleted

## 2020-08-03 ENCOUNTER — Other Ambulatory Visit: Payer: Self-pay

## 2020-08-03 ENCOUNTER — Inpatient Hospital Stay (HOSPITAL_COMMUNITY)
Admission: EM | Admit: 2020-08-03 | Discharge: 2020-08-05 | DRG: 291 | Disposition: A | Discharge status: Home Health | Payer: Medicare Other | Source: Ambulatory Visit | Attending: Internal Medicine | Admitting: Internal Medicine

## 2020-08-03 ENCOUNTER — Encounter (HOSPITAL_COMMUNITY): Payer: Self-pay

## 2020-08-03 ENCOUNTER — Observation Stay (HOSPITAL_COMMUNITY): Payer: Medicare Other

## 2020-08-03 ENCOUNTER — Emergency Department (HOSPITAL_COMMUNITY): Payer: Medicare Other

## 2020-08-03 DIAGNOSIS — I480 Paroxysmal atrial fibrillation: Secondary | ICD-10-CM | POA: Diagnosis present

## 2020-08-03 DIAGNOSIS — R079 Chest pain, unspecified: Secondary | ICD-10-CM

## 2020-08-03 DIAGNOSIS — Z79899 Other long term (current) drug therapy: Secondary | ICD-10-CM

## 2020-08-03 DIAGNOSIS — I5023 Acute on chronic systolic (congestive) heart failure: Secondary | ICD-10-CM

## 2020-08-03 DIAGNOSIS — R0602 Shortness of breath: Secondary | ICD-10-CM

## 2020-08-03 DIAGNOSIS — Z888 Allergy status to other drugs, medicaments and biological substances status: Secondary | ICD-10-CM

## 2020-08-03 DIAGNOSIS — I509 Heart failure, unspecified: Secondary | ICD-10-CM

## 2020-08-03 DIAGNOSIS — Z7901 Long term (current) use of anticoagulants: Secondary | ICD-10-CM

## 2020-08-03 DIAGNOSIS — I4891 Unspecified atrial fibrillation: Secondary | ICD-10-CM

## 2020-08-03 DIAGNOSIS — I110 Hypertensive heart disease with heart failure: Secondary | ICD-10-CM | POA: Diagnosis not present

## 2020-08-03 DIAGNOSIS — F17210 Nicotine dependence, cigarettes, uncomplicated: Secondary | ICD-10-CM | POA: Diagnosis present

## 2020-08-03 HISTORY — DX: Unspecified atrial fibrillation: I48.91

## 2020-08-03 LAB — CBC WITH DIFFERENTIAL/PLATELET
Abs Immature Granulocytes: 0.03 10*3/uL (ref 0.00–0.07)
Basophils Absolute: 0.1 10*3/uL (ref 0.0–0.1)
Basophils Relative: 1 %
Eosinophils Absolute: 0.1 10*3/uL (ref 0.0–0.5)
Eosinophils Relative: 1 %
HCT: 48.1 % (ref 39.0–52.0)
Hemoglobin: 15.6 g/dL (ref 13.0–17.0)
Immature Granulocytes: 0 %
Lymphocytes Relative: 42 %
Lymphs Abs: 2.9 10*3/uL (ref 0.7–4.0)
MCH: 27.9 pg (ref 26.0–34.0)
MCHC: 32.4 g/dL (ref 30.0–36.0)
MCV: 86 fL (ref 80.0–100.0)
Monocytes Absolute: 0.7 10*3/uL (ref 0.1–1.0)
Monocytes Relative: 10 %
Neutro Abs: 3.2 10*3/uL (ref 1.7–7.7)
Neutrophils Relative %: 46 %
Platelets: 280 10*3/uL (ref 150–400)
RBC: 5.59 MIL/uL (ref 4.22–5.81)
RDW: 14.8 % (ref 11.5–15.5)
WBC: 7 10*3/uL (ref 4.0–10.5)
nRBC: 0 % (ref 0.0–0.2)

## 2020-08-03 LAB — BASIC METABOLIC PANEL
Anion gap: 9 (ref 5–15)
BUN: 16 mg/dL (ref 8–23)
CO2: 22 mmol/L (ref 22–32)
Calcium: 8.5 mg/dL — ABNORMAL LOW (ref 8.9–10.3)
Chloride: 107 mmol/L (ref 98–111)
Creatinine, Ser: 1.11 mg/dL (ref 0.61–1.24)
GFR, Estimated: >60 mL/min (ref >60)
Glucose, Bld: 107 mg/dL — ABNORMAL HIGH (ref 70–99)
Potassium: 3.5 mmol/L (ref 3.5–5.1)
Sodium: 138 mmol/L (ref 135–145)

## 2020-08-03 LAB — TROPONIN I (HIGH SENSITIVITY)
Troponin I (High Sensitivity): 21 ng/L — ABNORMAL HIGH (ref <18)
Troponin I (High Sensitivity): 27 ng/L — ABNORMAL HIGH (ref <18)

## 2020-08-03 LAB — MAGNESIUM: Magnesium: 1.8 mg/dL (ref 1.7–2.4)

## 2020-08-03 LAB — BRAIN NATRIURETIC PEPTIDE: B Natriuretic Peptide: 411.6 pg/mL — ABNORMAL HIGH (ref 0.0–100.0)

## 2020-08-03 MED ORDER — METOPROLOL TARTRATE 25 MG PO TABS: 50.0000 mg | ORAL_TABLET | Freq: Two times a day (BID) | ORAL | Status: DC

## 2020-08-03 MED ORDER — SODIUM CHLORIDE 0.9% FLUSH
3.0000 mL | INTRAVENOUS | Status: DC | PRN
  Administered 2020-08-03 – 2020-08-04 (×2): 3 mL via INTRAVENOUS

## 2020-08-03 MED ORDER — APIXABAN 5 MG PO TABS
5.0000 mg | ORAL_TABLET | Freq: Two times a day (BID) | ORAL | Status: DC
  Administered 2020-08-03 – 2020-08-05 (×4): 5 mg via ORAL
  Filled 2020-08-03 (×4): qty 1

## 2020-08-03 MED ORDER — DILTIAZEM LOAD VIA INFUSION
20.0000 mg | Freq: Once | INTRAVENOUS | Status: AC
  Administered 2020-08-03: 20 mg via INTRAVENOUS
  Filled 2020-08-03: qty 20

## 2020-08-03 MED ORDER — DILTIAZEM HCL-DEXTROSE 125-5 MG/125ML-% IV SOLN (PREMIX)
5.0000 mg/h | INTRAVENOUS | Status: DC
  Administered 2020-08-03: 8 mg/h via INTRAVENOUS
  Filled 2020-08-03 (×2): qty 125

## 2020-08-03 MED ORDER — METOPROLOL TARTRATE 50 MG PO TABS
50.0000 mg | ORAL_TABLET | Freq: Two times a day (BID) | ORAL | Status: DC
  Administered 2020-08-03 – 2020-08-05 (×4): 50 mg via ORAL
  Filled 2020-08-03 (×4): qty 1

## 2020-08-03 MED ORDER — METOPROLOL TARTRATE 25 MG PO TABS: 25.0000 mg | ORAL_TABLET | Freq: Two times a day (BID) | ORAL | Status: DC

## 2020-08-03 MED ORDER — METOPROLOL TARTRATE 5 MG/5ML IV SOLN
5.0000 mg | Freq: Once | INTRAVENOUS | Status: DC
  Filled 2020-08-03: qty 5

## 2020-08-03 MED ORDER — METOPROLOL TARTRATE 5 MG/5ML IV SOLN
2.5000 mg | Freq: Four times a day (QID) | INTRAVENOUS | Status: DC | PRN
  Administered 2020-08-04: 2.5 mg via INTRAVENOUS
  Filled 2020-08-03: qty 5

## 2020-08-03 MED ORDER — POTASSIUM CHLORIDE CRYS ER 20 MEQ PO TBCR
40.0000 meq | EXTENDED_RELEASE_TABLET | Freq: Once | ORAL | Status: AC
  Administered 2020-08-03: 40 meq via ORAL
  Filled 2020-08-03: qty 2

## 2020-08-03 MED ORDER — ONDANSETRON HCL 4 MG/2ML IJ SOLN: 4.0000 mg | Freq: Four times a day (QID) | INTRAMUSCULAR | Status: DC | PRN

## 2020-08-03 MED ORDER — FUROSEMIDE 10 MG/ML IJ SOLN
20.0000 mg | Freq: Once | INTRAMUSCULAR | Status: AC
  Administered 2020-08-03: 20 mg via INTRAVENOUS
  Filled 2020-08-03: qty 2

## 2020-08-03 MED ORDER — SODIUM CHLORIDE 0.9% FLUSH
3.0000 mL | Freq: Two times a day (BID) | INTRAVENOUS | Status: DC
  Administered 2020-08-04 – 2020-08-05 (×3): 3 mL via INTRAVENOUS

## 2020-08-03 MED ORDER — SODIUM CHLORIDE 0.9 % IV SOLN: 250.0000 mL | INTRAVENOUS | Status: DC | PRN

## 2020-08-03 MED ORDER — ACETAMINOPHEN 325 MG PO TABS: 650.0000 mg | ORAL_TABLET | ORAL | Status: DC | PRN

## 2020-08-03 MED ORDER — MORPHINE SULFATE (PF) 4 MG/ML IV SOLN
4.0000 mg | Freq: Once | INTRAVENOUS | Status: AC
  Administered 2020-08-03: 4 mg via INTRAVENOUS
  Filled 2020-08-03: qty 1

## 2020-08-03 NOTE — H&P (Signed)
History and Physical    Christopher Wilkins BWL:[id number] DOB: 10-Jul-1956 DOA: 08/03/2020  PCP: Rogers Blocker, MD (Confirm with patient/family/NH records and if not entered, this has to be entered at [facility] point of entry) Patient coming from: Home  I have personally briefly reviewed patient's old medical records in [facility]  Chief Complaint: SOB  HPI: Christopher Wilkins is a 64 y.o. male with medical history significant of paroxysmal A. fib status post cardioversion in September 6811, chronic systolic CHF, presented with increasing short of breath.  Symptoms started yesterday, gradual getting worse, shortness of breath with minimal activity.  Denies any cough wheezing fever or chills.  No chest pains.  Last night patient could not sleep on a flat bed due to shortness of breath. ED Course: Patient was found in rapid A. fib, Cardizem 20 mg IV push given and started on Cardizem drip 5 mg/h.  Chest x-ray showed bilateral pulmonary congestion.  Blood work potassium 3.5, creatinine 1.1.  Troponin negative x1, WBC 7.1 Review of Systems: As per HPI otherwise 14 point review of systems negative.    Past Medical History:  Diagnosis Date  . Atrial fibrillation ([place])   . Hypertension     Past Surgical History:  Procedure Laterality Date  . CARDIOVERSION N/A 06/20/2020   Procedure: CARDIOVERSION;  Surgeon: Jerline Pain, MD;  Location: [facility] ENDOSCOPY;  Service: Cardiovascular;  Laterality: N/A;  . CHOLECYSTECTOMY    . OTHER SURGICAL HISTORY     "Shot a couple of times"  . TEE WITHOUT CARDIOVERSION N/A 06/20/2020   Procedure: TRANSESOPHAGEAL ECHOCARDIOGRAM (TEE);  Surgeon: Jerline Pain, MD;  Location: [facility] ENDOSCOPY;  Service: Cardiovascular;  Laterality: N/A;     reports that he has been smoking cigarettes. He has been smoking about 0.25 packs per day. He has never used smokeless tobacco. He reports current alcohol use. He reports that he does not use drugs.  Allergies  Allergen Reactions  . Codeine  Nausea And Vomiting  . Lisinopril Other (See Comments)    "Didn't do well in my body"  . Shellfish Allergy Other (See Comments)    Can tolerate shrimp in 2021 (??)    Family History  Problem Relation Age of Onset  . Colon cancer Neg Hx      Prior to Admission medications   Medication Sig Start Date End Date Taking? Authorizing Provider  amLODipine (NORVASC) 5 MG tablet Take 5 mg by mouth daily.    Yes [provider]  apixaban (ELIQUIS) 5 MG TABS tablet Take 1 tablet (5 mg total) by mouth 2 (two) times daily. 07/11/20  Yes Fulp, Cammie, MD  hydrochlorothiazide (MICROZIDE) 12.5 MG capsule Take 12.5 mg by mouth daily.    Yes [provider]  metoprolol succinate (TOPROL-XL) 50 MG 24 hr tablet Take 1 tablet (50 mg total) by mouth daily. Take with or immediately following a meal. 07/11/20  Yes Antony Blackbird, MD    Physical Exam: Vitals:   08/03/20 1645 08/03/20 1700 08/03/20 1730 08/03/20 1847  BP: (!) 107/92 (!) 113/92 (!) 118/95 (!) 119/95  Pulse: (!) 52 86 71 85  Resp: (!) 30 (!) 36  18  Temp:    98.6 F (37 C)  TempSrc:    Oral  SpO2: 98% 98% 95% 97%    Constitutional: NAD, calm, comfortable Vitals:   08/03/20 1645 08/03/20 1700 08/03/20 1730 08/03/20 1847  BP: (!) 107/92 (!) 113/92 (!) 118/95 (!) 119/95  Pulse: (!) 52 86 71  85  Resp: (!) 30 (!) 36  18  Temp:    98.6 F (37 C)  TempSrc:    Oral  SpO2: 98% 98% 95% 97%   Eyes: PERRL, lids and conjunctivae normal ENMT: Mucous membranes are moist. Posterior pharynx clear of any exudate or lesions.Normal dentition.  Neck: normal, supple, no masses, no thyromegaly Respiratory: clear to auscultation bilaterally, no wheezing, fine crackles on bilateral bases.  Increasing respiratory effort. No accessory muscle use.  Cardiovascular: Irregular heart rate, no murmurs / rubs / gallops.  2+ extremity edema. 2+ pedal pulses. No carotid bruits.  Abdomen: no tenderness, no masses palpated. No hepatosplenomegaly. Bowel  sounds positive.  Musculoskeletal: no clubbing / cyanosis. No joint deformity upper and lower extremities. Good ROM, no contractures. Normal muscle tone.  Skin: no rashes, lesions, ulcers. No induration Neurologic: CN 2-12 grossly intact. Sensation intact, DTR normal. Strength 5/5 in all 4.  Psychiatric: Normal judgment and insight. Alert and oriented x 3. Normal mood.     Labs on Admission: I have personally reviewed following labs and imaging studies  CBC: Recent Labs  Lab 08/03/20 1440  WBC 7.0  NEUTROABS 3.2  HGB 15.6  HCT 48.1  MCV 86.0  PLT 810   Basic Metabolic Panel: Recent Labs  Lab 08/03/20 1612  NA 138  K 3.5  CL 107  CO2 22  GLUCOSE 107*  BUN 16  CREATININE 1.11  CALCIUM 8.5*   GFR: CrCl cannot be calculated (Unknown ideal weight.). Liver Function Tests: No results for input(s): AST, ALT, ALKPHOS, BILITOT, PROT, ALBUMIN in the last 168 hours. No results for input(s): LIPASE, AMYLASE in the last 168 hours. No results for input(s): AMMONIA in the last 168 hours. Coagulation Profile: No results for input(s): INR, PROTIME in the last 168 hours. Cardiac Enzymes: No results for input(s): CKTOTAL, CKMB, CKMBINDEX, TROPONINI in the last 168 hours. BNP (last 3 results) No results for input(s): PROBNP in the last 8760 hours. HbA1C: No results for input(s): HGBA1C in the last 72 hours. CBG: No results for input(s): GLUCAP in the last 168 hours. Lipid Profile: No results for input(s): CHOL, HDL, LDLCALC, TRIG, CHOLHDL, LDLDIRECT in the last 72 hours. Thyroid Function Tests: No results for input(s): TSH, T4TOTAL, FREET4, T3FREE, THYROIDAB in the last 72 hours. Anemia Panel: No results for input(s): VITAMINB12, FOLATE, FERRITIN, TIBC, IRON, RETICCTPCT in the last 72 hours. Urine analysis: No results found for: COLORURINE, APPEARANCEUR, [place], [place], GLUCOSEU, HGBUR, BILIRUBINUR, KETONESUR, PROTEINUR, UROBILINOGEN, NITRITE, LEUKOCYTESUR  Radiological Exams  on Admission: DG Chest Portable 1 View  Result Date: 08/03/2020 CLINICAL DATA:  Shortness of breath. EXAM: PORTABLE CHEST 1 VIEW COMPARISON:  June 18, 2020. FINDINGS: Stable cardiomegaly with mild central pulmonary vascular congestion. No pneumothorax or pleural effusion is noted. Both lungs are clear. The visualized skeletal structures are unremarkable. IMPRESSION: Stable cardiomegaly with mild central pulmonary vascular congestion. Electronically Signed   By: Marijo Conception M.D.   On: 08/03/2020 15:26    EKG: Independently reviewed.  Rapid A. fib  Assessment/Plan Active Problems:   A-fib (HCC)   CHF (congestive heart failure) ([place])  (please populate well all problems here in Problem List. (For example, if patient is on BP meds at home and you resume or decide to hold them, it is a problem that needs to be her. Same for CAD, COPD, HLD and so on)  Acute on chronic systolic CHF decompensation -Likely from uncontrolled A. Fib -Discontinue Cardizem given patient has CHF, and in decompensation -  Increase his metoprolol to 50 mg twice daily, as needed Lopressor IV pushes for heart rate more than 120. -Consider consult cardiology tomorrow if heart rate not well controlled -Lasix 20 mg IV x1 -Replenish potassium, check Mg -It appears that patient was discharged on Entresto last month, not clear why it was continued. -Allergy to ACEI in the past.  Rapid A. Fib -Continue Eliquis, rate control strategy as above.  DVT prophylaxis: Eliquis Code Status: Full code Family Communication: None at bedside Disposition Plan: Plan for 24 hours hospital supervision once heart rate controlled and CHF symptom improved patient can be discharged home. Consults called: None Admission status: Tele Obs   Lequita Halt MD [facility] Pager [phone]  08/03/2020, 6:56 PM

## 2020-08-03 NOTE — Plan of Care (Signed)
Patient admitted with Afib with RVR

## 2020-08-03 NOTE — ED Notes (Signed)
Attempted to call Pt's sister but no answer but no answer. Pt being transported to [facility] by EMS

## 2020-08-03 NOTE — ED Provider Notes (Signed)
BP (!) 145/84   Pulse (!) 55   Temp (!) 97.3 F (36.3 C) (Oral)   Resp 18 Comment: varies from 18-24  SpO2 100%   Patient is here with shortness of breath since last night, had to sleep sitting up.  States that he short of breath even at rest today.  He has had some chest pain and points to his mid sternal area.  He states that he broke out into a sweat earlier today and felt briefly weak.  I reviewed the medical record.  The patient was just hospitalized last month with atrial fibrillation rapid ventricular response.  He has seen cardiology in follow-up.  He has seen internal medicine in follow-up.  He is compliant with his medications.  EKG today shows again atrial fibrillation with rapid ventricular response, rate 134.  Early LVH.  Some ST and T wave changes.  PVCs.  Patient is transported by ambulance to the emergency room for higher level of care   Raylene Everts, MD 08/03/20 1414

## 2020-08-03 NOTE — Discharge Instructions (Signed)
Go to ER

## 2020-08-03 NOTE — ED Provider Notes (Signed)
[place] EMERGENCY DEPARTMENT Provider Note   CSN: [id number] Arrival date & time:        History Chief Complaint  Patient presents with  . Atrial Fibrillation    Christopher Wilkins is a 64 y.o. male.  The history is provided by the patient.  Shortness of Breath Severity:  Moderate Onset quality:  Sudden Timing:  Constant Progression:  Unchanged Chronicity:  New Worsened by:  Exertion (exertion) Associated symptoms: chest pain, cough and diaphoresis   Associated symptoms: no abdominal pain, no fever, no headaches, no neck pain, no sore throat and no vomiting   Chest pain:    Severity:  Moderate   Onset quality:  Sudden   Duration:  18 hours   Timing:  Constant   Progression:  Unchanged   Chronicity:  New Risk factors comment:  Hx of Afib, HF      Past Medical History:  Diagnosis Date  . Atrial fibrillation ([place])   . Hypertension     Patient Active Problem List   Diagnosis Date Noted  . Acute CHF ([place]) 07/02/2020  . Cardiomyopathy ([place]) 07/02/2020  . Anticoagulated 07/02/2020  . Essential hypertension 07/02/2020  . Atrial fibrillation with RVR ([place]) 06/18/2020  . Angioedema 04/19/2019  . AKI (acute kidney injury) [facility])     Past Surgical History:  Procedure Laterality Date  . CARDIOVERSION N/A 06/20/2020   Procedure: CARDIOVERSION;  Surgeon: Jerline Pain, MD;  Location: [facility] ENDOSCOPY;  Service: Cardiovascular;  Laterality: N/A;  . CHOLECYSTECTOMY    . OTHER SURGICAL HISTORY     "Shot a couple of times"  . TEE WITHOUT CARDIOVERSION N/A 06/20/2020   Procedure: TRANSESOPHAGEAL ECHOCARDIOGRAM (TEE);  Surgeon: Jerline Pain, MD;  Location: [facility] ENDOSCOPY;  Service: Cardiovascular;  Laterality: N/A;       Family History  Problem Relation Age of Onset  . Colon cancer Neg Hx     Social History   Tobacco Use  . Smoking status: Current Every Day Smoker    Packs/day: 0.25    Types: Cigarettes  . Smokeless tobacco: Never Used  Vaping  Use  . Vaping Use: Some days  Substance Use Topics  . Alcohol use: Yes    Alcohol/week: 0.0 standard drinks    Comment: occasionally; once every 2 weeks  . Drug use: No    Home Medications Prior to Admission medications   Medication Sig Start Date End Date Taking? Authorizing Provider  amLODipine (NORVASC) 5 MG tablet Take 5 mg by mouth daily. Take 1 tablet Daily    [provider]  apixaban (ELIQUIS) 5 MG TABS tablet Take 1 tablet (5 mg total) by mouth 2 (two) times daily. 07/11/20   Fulp, Cammie, MD  hydrochlorothiazide (MICROZIDE) 12.5 MG capsule Take 12.5 mg by mouth daily. Take 1 Capsule Daily    [provider]  metoprolol succinate (TOPROL-XL) 50 MG 24 hr tablet Take 1 tablet (50 mg total) by mouth daily. Take with or immediately following a meal. 07/11/20   Fulp, Cammie, MD    Allergies    Codeine, Lisinopril, and Shellfish allergy  Review of Systems   Review of Systems  Constitutional: Positive for diaphoresis. Negative for chills and fever.  HENT: Negative for congestion and sore throat.   Respiratory: Positive for cough, chest tightness and shortness of breath.   Cardiovascular: Positive for chest pain and palpitations. Negative for leg swelling.  Gastrointestinal: Negative for abdominal pain, nausea and vomiting.  Genitourinary: Negative for difficulty urinating.  Musculoskeletal: Negative for neck pain.  Neurological: Negative for dizziness and headaches.  All other systems reviewed and are negative.   Physical Exam Updated Vital Signs BP (!) 127/107   Pulse 66   Temp 98.5 F (36.9 C) (Oral)   Resp (!) 28   SpO2 99%   Physical Exam Vitals reviewed.  Constitutional:      General: He is not in acute distress.    Appearance: Normal appearance. He is not diaphoretic.  HENT:     Head: Normocephalic and atraumatic.     Nose: Nose normal.     Mouth/Throat:     Mouth: Mucous membranes are moist.     Pharynx: Oropharynx is clear.  Eyes:      Conjunctiva/sclera: Conjunctivae normal.  Cardiovascular:     Heart sounds: Normal heart sounds.  Pulmonary:     Effort: Pulmonary effort is normal.     Breath sounds: Rales present.     Comments: Rales in bases Abdominal:     General: Abdomen is flat.     Palpations: Abdomen is soft.     Tenderness: There is no abdominal tenderness.  Musculoskeletal:     Cervical back: Neck supple.     Right lower leg: No edema.     Left lower leg: No edema.  Skin:    General: Skin is warm and dry.  Neurological:     Mental Status: He is alert.  Psychiatric:        Mood and Affect: Mood normal.        Behavior: Behavior normal.     ED Results / Procedures / Treatments   Labs (all labs ordered are listed, but only abnormal results are displayed) Labs Reviewed  CBC WITH DIFFERENTIAL/PLATELET  BASIC METABOLIC PANEL  BRAIN NATRIURETIC PEPTIDE  TROPONIN I (HIGH SENSITIVITY)    EKG EKG Interpretation  Date/Time:  Sunday August 03 2020 14:24:38 EDT Ventricular Rate:  131 PR Interval:    QRS Duration: 91 QT Interval:  331 QTC Calculation: 489 R Axis:   56 Text Interpretation: Atrial fibrillation Borderline repolarization abnormality Borderline prolonged QT interval No STEMI Confirmed by Nanda Quinton [phone]) on 08/03/2020 2:29:01 PM Also confirmed by Lacretia Leigh ([id number])  on 08/03/2020 3:06:32 PM   Radiology No results found.  Procedures Procedures (including critical care time)  Medications Ordered in ED Medications - No data to display  ED Course  I have reviewed the triage vital signs and the nursing notes.  Pertinent labs & imaging results that were available during my care of the patient were reviewed by me and considered in my medical decision making (see chart for details).    MDM Rules/Calculators/A&P                           Medical Decision Making: Christopher Wilkins is a 64 y.o. male who presented to the ED today with SOB and CP. Sudden onset last night, worse  with exertion and laying flat.    Past medical history significant for Pt in Afib with RVR on arrival, pt on eliquis and metoprolol at home. Pt has hx of HF EF 35%. Reviewed and confirmed nursing documentation for past medical history, family history, social history.  On my initial exam, the pt was in NAD, BP 130/100, HR 110-130s, Mild rales in lung bases  Pt in Afib with RVR.  Diltiazem bolus and infusion started for pharmacologic cardioversion, rate improved  Clinical picture concerning for HF  exacerbation.  BNP elevated and CXR concerning for volume overload.  Pt given 20 IV Lasix for diuresis.    Consults: none performed  All radiology and laboratory studies reviewed independently and with my attending physician, agree with reading provided by radiologist unless otherwise noted.   Based on the above findings, I believe patient requires admission.   The above care was discussed with and agreed upon by my attending physician. Emergency Department Medication Summary:  Medications  diltiazem (CARDIZEM) 125 mg in dextrose 5% 125 mL (1 mg/mL) infusion (8 mg/hr Intravenous New Bag/Given 08/03/20 1549)  diltiazem (CARDIZEM) 1 mg/mL load via infusion 20 mg (20 mg Intravenous Bolus from Bag 08/03/20 1542)        Final Clinical Impression(s) / ED Diagnoses Final diagnoses:  None    Rx / DC Orders ED Discharge Orders    None       Roosevelt Locks, MD 08/03/20 2024    Lacretia Leigh, MD 08/05/20 1215

## 2020-08-03 NOTE — ED Provider Notes (Signed)
I saw and evaluated the patient, reviewed the resident's note and I agree with the findings and plan.  EKG: EKG Interpretation  Date/Time:  Sunday August 03 2020 14:24:38 EDT Ventricular Rate:  131 PR Interval:    QRS Duration: 91 QT Interval:  331 QTC Calculation: 489 R Axis:   56 Text Interpretation: Atrial fibrillation Borderline repolarization abnormality Borderline prolonged QT interval No STEMI Confirmed by Nanda Quinton [phone]) on 08/03/2020 2:29:01 PM Also confirmed by Lacretia Leigh ([id number])  on 08/03/2020 3:36:76 PM 64 year old male presents with acute onset of shortness of breath as well as palpitations began last night.  Patient has a history of paroxysmal A. fib and is in A. fib here.  States compliance with his medications.  Will give diltiazem here as well as labs and x-rays and likely admit.   Lacretia Leigh, MD 08/03/20 1527

## 2020-08-03 NOTE — ED Notes (Signed)
Report called to Roselyn Reef, ED Charge RN.

## 2020-08-03 NOTE — ED Notes (Signed)
Patient is being discharged from the Urgent Care and sent to the Emergency Department via [facility]. Per Dr Meda Coffee, patient is in need of higher level of care due to chest pain, dyspnea, a-fib. Patient is aware and verbalizes understanding of plan of care.  Vitals:   08/03/20 1335  BP: (!) 145/84  Pulse: (!) 55  Resp: 18  Temp: (!) 97.3 F (36.3 C)  SpO2: 100%

## 2020-08-03 NOTE — ED Triage Notes (Signed)
Pt brought to ED via EMS from urgent care with c/o constant centralized chest pressure radiating to right arm and shortness of breath; afib RVT present on EKG in office. Pt given 324mg  aspirin and 200 mL NS by EMS PTA. Patient takes eliquis. Currently A&Ox4, afib with a rate of 149.

## 2020-08-03 NOTE — ED Notes (Signed)
EKG shown to Dr Meda Coffee.

## 2020-08-03 NOTE — ED Notes (Signed)
This RN Informed tech if can place pt on zoll monitor

## 2020-08-03 NOTE — ED Notes (Signed)
[facility] notified of need for pt transfer to ED.

## 2020-08-03 NOTE — ED Triage Notes (Signed)
Pt reports having TEE done last month for a-fib; has been placed on Eloquis.  C/O SOB since last night, worse when laying down.  C/O some slight chest pain.  States he "broke out in a sweat" PTA.  Skin warm and dry @ this time.

## 2020-08-04 DIAGNOSIS — I4891 Unspecified atrial fibrillation: Secondary | ICD-10-CM | POA: Diagnosis not present

## 2020-08-04 DIAGNOSIS — F17210 Nicotine dependence, cigarettes, uncomplicated: Secondary | ICD-10-CM | POA: Diagnosis present

## 2020-08-04 DIAGNOSIS — I509 Heart failure, unspecified: Secondary | ICD-10-CM | POA: Diagnosis present

## 2020-08-04 DIAGNOSIS — Z7901 Long term (current) use of anticoagulants: Secondary | ICD-10-CM | POA: Diagnosis not present

## 2020-08-04 DIAGNOSIS — Z888 Allergy status to other drugs, medicaments and biological substances status: Secondary | ICD-10-CM | POA: Diagnosis not present

## 2020-08-04 DIAGNOSIS — I5023 Acute on chronic systolic (congestive) heart failure: Secondary | ICD-10-CM

## 2020-08-04 DIAGNOSIS — I110 Hypertensive heart disease with heart failure: Secondary | ICD-10-CM | POA: Diagnosis present

## 2020-08-04 DIAGNOSIS — I480 Paroxysmal atrial fibrillation: Secondary | ICD-10-CM | POA: Diagnosis present

## 2020-08-04 DIAGNOSIS — Z79899 Other long term (current) drug therapy: Secondary | ICD-10-CM | POA: Diagnosis not present

## 2020-08-04 LAB — BASIC METABOLIC PANEL
Anion gap: 8 (ref 5–15)
BUN: 19 mg/dL (ref 8–23)
CO2: 23 mmol/L (ref 22–32)
Calcium: 8.7 mg/dL — ABNORMAL LOW (ref 8.9–10.3)
Chloride: 107 mmol/L (ref 98–111)
Creatinine, Ser: 1.29 mg/dL — ABNORMAL HIGH (ref 0.61–1.24)
GFR, Estimated: >60 mL/min (ref >60)
Glucose, Bld: 126 mg/dL — ABNORMAL HIGH (ref 70–99)
Potassium: 3.9 mmol/L (ref 3.5–5.1)
Sodium: 138 mmol/L (ref 135–145)

## 2020-08-04 MED ORDER — FUROSEMIDE 20 MG PO TABS
20.0000 mg | ORAL_TABLET | Freq: Once | ORAL | Status: AC
  Administered 2020-08-04: 20 mg via ORAL
  Filled 2020-08-04: qty 1

## 2020-08-04 MED ORDER — FUROSEMIDE 20 MG PO TABS
20.0000 mg | ORAL_TABLET | Freq: Every day | ORAL | Status: DC
  Administered 2020-08-04: 20 mg via ORAL
  Filled 2020-08-04: qty 1

## 2020-08-04 NOTE — Progress Notes (Signed)
PROGRESS NOTE    DYRELL TUCCILLO  TOI:[id number] DOB: Jul 21, 1956 DOA: 08/03/2020 PCP: Rogers Blocker, MD   Brief Narrative:   ONIS MARKOFF is a 64 y.o. male with medical history significant of paroxysmal A. fib status post cardioversion in September 8338, chronic systolic CHF, presented with increasing short of breath.  Symptoms started yesterday, gradual getting worse, shortness of breath with minimal activity.  Denies any cough wheezing fever or chills.  No chest pains.  Last night patient could not sleep on a flat bed due to shortness of breath.  10/25: Reviewing old notes. Looks like he was supposed to be on lasix already per his last cardiology visit. Will resume his HF/A fib regimen from that visit. Breathing is improved, but he doesn't feel back to baseline. Continue diuresis. Follow Scr.   Assessment & Plan:  Acute on chronic systolic CHF decompensation     - Likely from uncontrolled A. Fib     - Continue metoprolol to 50 mg twice daily, as needed Lopressor IV pushes for heart rate more than 120.     - continue lasix 20mg ; follow SCr     - Angioedema with ACEi in the past  Rapid A. Fib     - Continue Eliquis     - continue metoprolol 50mg  BID  DVT prophylaxis: eliquis Code Status: FULL Family Communication: Spoke with sister   Status is: Observation  The patient remains OBS appropriate and will d/c before 2 midnights.  Dispo: The patient is from: Home              Anticipated d/c is to: Home              Anticipated d/c date is: 1 day              Patient currently is not medically stable to d/c.  Consultants:   None  Procedures:   None  Antimicrobials:  . None   ROS:  ROS is negative for all not previously mentioned.  Subjective: "It still doesn't feel normal"  Objective: Vitals:   08/04/20 0040 08/04/20 0411 08/04/20 0855 08/04/20 1131  BP: 103/74 104/78 (!) 120/92 122/89  Pulse: 85 99 87 93  Resp: 15 19 20 20   Temp: 97.7 F (36.5 C) 97.6 F (36.4  C) 98.2 F (36.8 C) 98.2 F (36.8 C)  TempSrc: Axillary Oral Oral Oral  SpO2: 96% 95% 90% 97%  Weight:  95.3 kg    Height:        Intake/Output Summary (Last 24 hours) at 08/04/2020 1449 Last data filed at 08/04/2020 1134 Gross per 24 hour  Intake --  Output 1100 ml  Net -1100 ml   Filed Weights   08/03/20 2311 08/04/20 0000 08/04/20 0411  Weight: 95.3 kg 95.3 kg 95.3 kg    Examination:  General: 64 y.o. male resting in bed in NAD Eyes: PERRL, normal sclera ENMT: Nares patent w/o discharge, orophaynx clear, dentition normal, ears w/o discharge/lesions/ulcers Neck: Supple, trachea midline Cardiovascular: RRR, +S1, S2, no m/g/r, equal pulses throughout Respiratory: slight crackle right side, no w/r, normal WOB on 2L [place] GI: BS+, NDNT, no masses noted, no organomegaly noted MSK: No e/c/c Skin: No rashes, bruises, ulcerations noted Neuro: A&O x 3, no focal deficits Psyc: Appropriate interaction and affect, calm/cooperative   Data Reviewed: I have personally reviewed following labs and imaging studies.  CBC: Recent Labs  Lab 08/03/20 1440  WBC 7.0  NEUTROABS 3.2  HGB 15.6  HCT  48.1  MCV 86.0  PLT 615   Basic Metabolic Panel: Recent Labs  Lab 08/03/20 1612 08/03/20 1655 08/04/20 0920  NA 138  --  138  K 3.5  --  3.9  CL 107  --  107  CO2 22  --  23  GLUCOSE 107*  --  126*  BUN 16  --  19  CREATININE 1.11  --  1.29*  CALCIUM 8.5*  --  8.7*  MG  --  1.8  --    GFR: Estimated Creatinine Clearance: 61.7 mL/min (A) (by C-G formula based on SCr of 1.29 mg/dL (H)). Liver Function Tests: No results for input(s): AST, ALT, ALKPHOS, BILITOT, PROT, ALBUMIN in the last 168 hours. No results for input(s): LIPASE, AMYLASE in the last 168 hours. No results for input(s): AMMONIA in the last 168 hours. Coagulation Profile: No results for input(s): INR, PROTIME in the last 168 hours. Cardiac Enzymes: No results for input(s): CKTOTAL, CKMB, CKMBINDEX, TROPONINI in the  last 168 hours. BNP (last 3 results) No results for input(s): PROBNP in the last 8760 hours. HbA1C: No results for input(s): HGBA1C in the last 72 hours. CBG: No results for input(s): GLUCAP in the last 168 hours. Lipid Profile: No results for input(s): CHOL, HDL, LDLCALC, TRIG, CHOLHDL, LDLDIRECT in the last 72 hours. Thyroid Function Tests: No results for input(s): TSH, T4TOTAL, FREET4, T3FREE, THYROIDAB in the last 72 hours. Anemia Panel: No results for input(s): VITAMINB12, FOLATE, FERRITIN, TIBC, IRON, RETICCTPCT in the last 72 hours. Sepsis Labs: No results for input(s): PROCALCITON, LATICACIDVEN in the last 168 hours.  No results found for this or any previous visit (from the past 240 hour(s)).    Radiology Studies: DG Chest Port 1 View  Result Date: 08/03/2020 CLINICAL DATA:  Heart failure EXAM: PORTABLE CHEST 1 VIEW COMPARISON:  08/03/2020 FINDINGS: Resolution of interstitial pulmonary edema. Mild cardiomegaly is unchanged. Normal pleural spaces. IMPRESSION: Resolution of interstitial pulmonary edema. Electronically Signed   By: Ulyses Jarred M.D.   On: 08/03/2020 22:38   DG Chest Portable 1 View  Result Date: 08/03/2020 CLINICAL DATA:  Shortness of breath. EXAM: PORTABLE CHEST 1 VIEW COMPARISON:  June 18, 2020. FINDINGS: Stable cardiomegaly with mild central pulmonary vascular congestion. No pneumothorax or pleural effusion is noted. Both lungs are clear. The visualized skeletal structures are unremarkable. IMPRESSION: Stable cardiomegaly with mild central pulmonary vascular congestion. Electronically Signed   By: Marijo Conception M.D.   On: 08/03/2020 15:26     Scheduled Meds: . apixaban  5 mg Oral BID  . metoprolol tartrate  50 mg Oral BID  . sodium chloride flush  3 mL Intravenous Q12H   Continuous Infusions: . sodium chloride       LOS: 0 days    Time spent: 25 minutes spent in the coordination of care today.    Jonnie Finner, DO [facility]  If 7PM-7AM, please contact night-coverage [email or web address] 08/04/2020, 2:49 PM

## 2020-08-04 NOTE — Progress Notes (Addendum)
Heart Failure Stewardship Pharmacist Progress Note   PCP: Rogers Blocker, MD PCP-Cardiologist: No primary care provider on file.    HPI:  64 yo M with PMH of HTN, obesity, CHF, afib, and tobacco use. He presented to the ED on 08/03/20 with shortness of breath, orthopnea, chest pain, and afib with RVR. Of note, patient was admitted in July 2020 for angioedema (lisinopril vs shellfish).  Current HF Medications: Furosemide 20 mg daily Metoprolol tartrate 50 mg BID  Prior to admission HF Medications: Metoprolol succinate 50 mg daily  Pertinent Lab Values: . Serum creatinine 1.29, BUN 23, Potassium 3.9, Sodium 138, BNP 411.6, Magnesium 1.8   Vital Signs: . Weight: 210 lbs (admission weight: 210 lbs) . Blood pressure: 120/90s  . Heart rate: 90-110s   Outpatient Pharmacy:  Prior to admission outpatient pharmacy: [facility] Is the patient willing to use [facility] at discharge? Yes   Assessment: 1. Acute on chronic systolic CHF (EF 89-38%), likely due to afib RVR. NYHA class II symptoms. - Continue furosemide 20 mg daily - Continue metoprolol tartrate 50 mg BID. Consider transitioning to metoprolol XL prior to discharge. - No ACE/ARB/ARNI with angioedema possibly from lisinopril use in the past. - Consider adding spironolactone for HF medication optimization. This could aid in diuresis with additional mortality benefit when compared to furosemide.   Plan: 1) Medication changes recommended at this time: - Add spironolactone 25 mg daily  2)  Education  - To be completed prior to discharge  Kerby Nora, PharmD, BCPS Heart Failure Stewardship Pharmacist Phone [phone]

## 2020-08-04 NOTE — Progress Notes (Signed)
Oxygen saturation decreased to 80s while sleeping.  Oxygen 2L via nasal cannula placed on patient.  Patient agreed to wear oxygen while sleeping.

## 2020-08-04 NOTE — Progress Notes (Signed)
   08/04/20 2045  Assess: MEWS Score  Temp 98.6 F (37 C)  BP (!) 111/92  Pulse Rate (!) 117  ECG Heart Rate (!) 117  Resp (!) 24  SpO2 95 %  O2 Device Nasal Cannula  O2 Flow Rate (L/min) 2 L/min  Assess: MEWS Score  MEWS Temp 0  MEWS Systolic 0  MEWS Pulse 2  MEWS RR 1  MEWS LOC 0  MEWS Score 3  MEWS Score Color Yellow  Assess: if the MEWS score is Yellow or Red  Were vital signs taken at a resting state? Yes  Focused Assessment Change from prior assessment (see assessment flowsheet)  Early Detection of Sepsis Score *See Row Information* Low  MEWS guidelines implemented *See Row Information* Yes  Treat  MEWS Interventions Administered prn meds/treatments;Administered scheduled meds/treatments  Take Vital Signs  Increase Vital Sign Frequency  Yellow: Q 2hr X 2 then Q 4hr X 2, if remains yellow, continue Q 4hrs  Escalate  MEWS: Escalate Yellow: discuss with charge nurse/RN and consider discussing with provider and RRT  Notify: Charge Nurse/RN  Name of Charge Nurse/RN Notified Christina   Date Charge Nurse/RN Notified 08/04/20  Time Charge Nurse/RN Notified 2112  Document  Patient Outcome Other (Comment) (monitoring for response to medications given)

## 2020-08-05 ENCOUNTER — Other Ambulatory Visit (HOSPITAL_COMMUNITY): Payer: Self-pay | Admitting: Internal Medicine

## 2020-08-05 DIAGNOSIS — I5023 Acute on chronic systolic (congestive) heart failure: Secondary | ICD-10-CM | POA: Diagnosis not present

## 2020-08-05 DIAGNOSIS — I4891 Unspecified atrial fibrillation: Secondary | ICD-10-CM | POA: Diagnosis not present

## 2020-08-05 LAB — CBC WITH DIFFERENTIAL/PLATELET
Abs Immature Granulocytes: 0.01 10*3/uL (ref 0.00–0.07)
Basophils Absolute: 0.1 10*3/uL (ref 0.0–0.1)
Basophils Relative: 1 %
Eosinophils Absolute: 0.1 10*3/uL (ref 0.0–0.5)
Eosinophils Relative: 1 %
HCT: 45.4 % (ref 39.0–52.0)
Hemoglobin: 14.8 g/dL (ref 13.0–17.0)
Immature Granulocytes: 0 %
Lymphocytes Relative: 52 %
Lymphs Abs: 3.3 10*3/uL (ref 0.7–4.0)
MCH: 27.9 pg (ref 26.0–34.0)
MCHC: 32.6 g/dL (ref 30.0–36.0)
MCV: 85.5 fL (ref 80.0–100.0)
Monocytes Absolute: 0.6 10*3/uL (ref 0.1–1.0)
Monocytes Relative: 10 %
Neutro Abs: 2.2 10*3/uL (ref 1.7–7.7)
Neutrophils Relative %: 36 %
Platelets: 264 10*3/uL (ref 150–400)
RBC: 5.31 MIL/uL (ref 4.22–5.81)
RDW: 14.6 % (ref 11.5–15.5)
WBC: 6.2 10*3/uL (ref 4.0–10.5)
nRBC: 0 % (ref 0.0–0.2)

## 2020-08-05 LAB — BASIC METABOLIC PANEL
Anion gap: 11 (ref 5–15)
BUN: 18 mg/dL (ref 8–23)
CO2: 22 mmol/L (ref 22–32)
Calcium: 8.6 mg/dL — ABNORMAL LOW (ref 8.9–10.3)
Chloride: 106 mmol/L (ref 98–111)
Creatinine, Ser: 1.29 mg/dL — ABNORMAL HIGH (ref 0.61–1.24)
GFR, Estimated: >60 mL/min (ref >60)
Glucose, Bld: 103 mg/dL — ABNORMAL HIGH (ref 70–99)
Potassium: 3.8 mmol/L (ref 3.5–5.1)
Sodium: 139 mmol/L (ref 135–145)

## 2020-08-05 LAB — MAGNESIUM: Magnesium: 1.9 mg/dL (ref 1.7–2.4)

## 2020-08-05 MED ORDER — FUROSEMIDE 40 MG PO TABS
40.0000 mg | ORAL_TABLET | Freq: Every day | ORAL | Status: DC
  Administered 2020-08-05: 40 mg via ORAL
  Filled 2020-08-05: qty 1

## 2020-08-05 MED ORDER — FUROSEMIDE 40 MG PO TABS: 40.0000 mg | ORAL_TABLET | Freq: Every day | ORAL | 0 refills | Status: DC

## 2020-08-05 MED ORDER — METOPROLOL TARTRATE 50 MG PO TABS
50.0000 mg | ORAL_TABLET | Freq: Once | ORAL | Status: AC
  Administered 2020-08-05: 50 mg via ORAL
  Filled 2020-08-05: qty 1

## 2020-08-05 MED ORDER — METOPROLOL SUCCINATE ER 100 MG PO TB24: 100.0000 mg | ORAL_TABLET | Freq: Every day | ORAL | 0 refills | Status: DC

## 2020-08-05 MED FILL — FUROSEMIDE 40 MG TABLET: 40 | 30 days supply | Qty: 30 | Fill #0

## 2020-08-05 MED FILL — METOPROLOL SUCCINATE ER 100: 100 | 30 days supply | Qty: 30 | Fill #0

## 2020-08-05 NOTE — Progress Notes (Signed)
SATURATION QUALIFICATIONS: (This note is used to comply with regulatory documentation for home oxygen)  Patient Saturations on Room Air while Ambulating = 96%    Please briefly explain why patient needs home oxygen:  Pt denies shortness of breath while ambulating.  Pt did drop down to 86% twice however was not sustained.

## 2020-08-05 NOTE — TOC Transition Note (Signed)
Transition of Care [facility]) - CM/SW Discharge Note   Patient Details  Name: Christopher Wilkins MRN: [id number] Date of Birth: 1956-08-05  Transition of [facility]) CM/SW Contact:  Zenon Mayo, RN Phone Number: 08/05/2020, 1:11 PM   Clinical Narrative:    Patient for possible dc today, he states he would like to have [facility] for CHF disease management, NCM offered choice, he states he has no preference.  NCM made referral to [facility] with [facility].  She is able to take referral.  Soc will begin 24 to 48 hrs post dc. NCM asked MD to put order in for [facility] and to send meds to [facility].    Final next level of care: [place] Barriers to Discharge: No Barriers Identified   Patient Goals and CMS Choice Patient states their goals for this hospitalization and ongoing recovery are:: get better CMS Medicare.gov Compare Post Acute Care list provided to:: Patient Choice offered to / list presented to : Patient  Discharge Placement                       Discharge Plan and Services                  DME Agency: NA       HH Arranged: RN, Disease Management [place] Agency:  ([place]) Date [place]: 08/05/20 Time [place]: 8280 Representative spoke with at [place]: [place] ([place]) Interventions     Readmission Risk Interventions No flowsheet data found.

## 2020-08-05 NOTE — Progress Notes (Addendum)
Heart Failure Stewardship Pharmacist Progress Note   PCP: Rogers Blocker, MD PCP-Cardiologist: No primary care provider on file.    HPI:  64 yo M with PMH of HTN, obesity, CHF, afib, and tobacco use. He presented to the ED on 08/03/20 with shortness of breath, orthopnea, chest pain, and afib with RVR. Of note, patient was admitted in July 2020 for angioedema (lisinopril vs shellfish).  Current HF Medications: Furosemide 40 mg daily Metoprolol tartrate 50 mg BID  Prior to admission HF Medications: Metoprolol succinate 50 mg daily  Pertinent Lab Values: . Serum creatinine 1.29, BUN 18, Potassium 3.8, Sodium 139, BNP 411.6, Magnesium 1.9  Vital Signs: . Weight: 204 lbs (admission weight: 210 lbs) . Blood pressure: 120/80s  . Heart rate: 70-110s   Outpatient Pharmacy:  Prior to admission outpatient pharmacy: [facility] Is the patient willing to use [facility] at discharge? Yes   Assessment: 1. Acute on chronic systolic CHF (EF 11-88%), likely due to afib RVR. NYHA class II symptoms. - Continue furosemide 40 mg daily - Metoprolol tartrate 50 mg BID. Consider transitioning to metoprolol succinate prior to discharge. - No ACE/ARB/ARNI with angioedema possibly from lisinopril use in the past. - Consider adding spironolactone for HF medication optimization. This could aid in diuresis with additional mortality benefit when compared to furosemide.    Plan: 1) Medication changes recommended at this time: - Add spironolactone 25 mg daily - Switch metoprolol tartrate to metoprolol succinate  2)  Education  - To be completed prior to discharge  Kerby Nora, PharmD, BCPS Heart Failure Stewardship Pharmacist Phone [phone]

## 2020-08-05 NOTE — Plan of Care (Signed)
°  Problem: Education: Goal: Knowledge of disease or condition will improve 08/05/2020 1300 by Camillia Herter, RN Outcome: Adequate for Discharge 08/05/2020 1300 by Camillia Herter, RN Outcome: Progressing 08/05/2020 0742 by Camillia Herter, RN Outcome: Progressing Goal: Understanding of medication regimen will improve 08/05/2020 1300 by Camillia Herter, RN Outcome: Adequate for Discharge 08/05/2020 1300 by Camillia Herter, RN Outcome: Progressing 08/05/2020 0742 by Camillia Herter, RN Outcome: Progressing Goal: Individualized Educational Video(s) 08/05/2020 1300 by Camillia Herter, RN Outcome: Adequate for Discharge 08/05/2020 1300 by Camillia Herter, RN Outcome: Progressing 08/05/2020 0742 by Camillia Herter, RN Outcome: Progressing   Problem: Activity: Goal: Ability to tolerate increased activity will improve 08/05/2020 1300 by Camillia Herter, RN Outcome: Adequate for Discharge 08/05/2020 1300 by Camillia Herter, RN Outcome: Progressing 08/05/2020 0742 by Camillia Herter, RN Outcome: Progressing   Problem: Cardiac: Goal: Ability to achieve and maintain adequate cardiopulmonary perfusion will improve 08/05/2020 1300 by Camillia Herter, RN Outcome: Adequate for Discharge 08/05/2020 1300 by Camillia Herter, RN Outcome: Progressing 08/05/2020 0742 by Camillia Herter, RN Outcome: Progressing   Problem: Health Behavior/Discharge Planning: Goal: Ability to safely manage health-related needs after discharge will improve 08/05/2020 1300 by Camillia Herter, RN Outcome: Adequate for Discharge 08/05/2020 1300 by Camillia Herter, RN Outcome: Progressing 08/05/2020 0742 by Camillia Herter, RN Outcome: Progressing

## 2020-08-05 NOTE — Plan of Care (Signed)

## 2020-08-05 NOTE — Discharge Summary (Signed)
Physician Discharge Summary  Christopher Wilkins TWS:[id number] DOB: 1956-04-20 DOA: 08/03/2020  PCP: Rogers Blocker, MD  Admit date: 08/03/2020 Discharge date: 08/05/2020  Admitted From: Home Disposition:  Discharged from home.   Recommendations for Outpatient Follow-up:  1. Follow up with PCP in 1 weeks 2. Please obtain BMP/CBC in one week 3. Follow up with Cardiology in 2 - 4 weeks.  Discharge Condition: Stable  CODE STATUS: FULL   Brief/Interim Summary: Christopher Tolen Jonesis a 64 y.o.malewith medical history significant ofparoxysmal A. fib status post cardioversion in September 0017, chronic systolic CHF, presented with increasing short of breath. Symptoms started yesterday, gradual getting worse, shortness of breath with minimal activity. Denies any cough wheezing fever or chills. No chest pains. Last night patient could not sleep on a flat bed due to shortness of breath.  10/26: 6MWT performed today. He did we'll. Temporary desat to 86% but was not sustained and pt did not c/o of dyspnea. He is tolerating lasix 40mg  PO. Will send him out on that dosage and d/c his HCTZ. HR is still up. He has tolerated additional metoprolol. We will send him out on toprol XL 100mg . He will need to follow up with PCP in 1 week and cardiology in 2 - 4 weeks.  Discharge Diagnoses: Acute on chronic systolic CHF decompensation     - Likely from uncontrolled A. Fib     - tolerating lasix 40mg  PO (Scr is stable @ 1.29, this is ok); follow up with PCP/cardiology     - tolerated increased in metoprolol; will send home with toprol XL 100mg      - Angioedema with ACEi in the past  Rapid A. Fib     - Continue Eliquis     - tolerated increased in metoprolol; will send home with toprol XL 100mg    Discharge Instructions   Allergies as of 08/05/2020      Reactions   Codeine Nausea And Vomiting   Lisinopril Other (See Comments)   "Didn't do well in my body"   Shellfish Allergy Other (See Comments)   Can  tolerate shrimp in 2021 (??)      Medication List    STOP taking these medications   amLODipine 5 MG tablet Commonly known as: NORVASC   hydrochlorothiazide 12.5 MG capsule Commonly known as: MICROZIDE     TAKE these medications   apixaban 5 MG Tabs tablet Commonly known as: ELIQUIS Take 1 tablet (5 mg total) by mouth 2 (two) times daily.   furosemide 40 MG tablet Commonly known as: LASIX Take 1 tablet (40 mg total) by mouth daily. Start taking on: August 06, 2020   metoprolol succinate 100 MG 24 hr tablet Commonly known as: Toprol XL Take 1 tablet (100 mg total) by mouth daily. Take with or immediately following a meal. What changed:   medication strength  how much to take       Allergies  Allergen Reactions  . Codeine Nausea And Vomiting  . Lisinopril Other (See Comments)    "Didn't do well in my body"  . Shellfish Allergy Other (See Comments)    Can tolerate shrimp in 2021 (??)    Consultations:  None  Procedures/Studies: DG Chest Port 1 View  Result Date: 08/03/2020 CLINICAL DATA:  Heart failure EXAM: PORTABLE CHEST 1 VIEW COMPARISON:  08/03/2020 FINDINGS: Resolution of interstitial pulmonary edema. Mild cardiomegaly is unchanged. Normal pleural spaces. IMPRESSION: Resolution of interstitial pulmonary edema. Electronically Signed   By: Cletus Gash.D.  On: 08/03/2020 22:38   DG Chest Portable 1 View  Result Date: 08/03/2020 CLINICAL DATA:  Shortness of breath. EXAM: PORTABLE CHEST 1 VIEW COMPARISON:  June 18, 2020. FINDINGS: Stable cardiomegaly with mild central pulmonary vascular congestion. No pneumothorax or pleural effusion is noted. Both lungs are clear. The visualized skeletal structures are unremarkable. IMPRESSION: Stable cardiomegaly with mild central pulmonary vascular congestion. Electronically Signed   By: Marijo Conception M.D.   On: 08/03/2020 15:26      Subjective: "I'm breathing better."  Discharge Exam: Vitals:   08/05/20  0423 08/05/20 0804  BP: 120/88 121/75  Pulse: 81 84  Resp: (!) 23 (!) 22  Temp: 98.2 F (36.8 C) 98.5 F (36.9 C)  SpO2: 96% 96%   Vitals:   08/04/20 2247 08/05/20 0040 08/05/20 0423 08/05/20 0804  BP: (!) 113/97 (!) 114/98 120/88 121/75  Pulse: 74 (!) 103 81 84  Resp: (!) 25 18 (!) 23 (!) 22  Temp: 98.3 F (36.8 C) 98.2 F (36.8 C) 98.2 F (36.8 C) 98.5 F (36.9 C)  TempSrc: Oral Oral Oral Oral  SpO2: 93% 99% 96% 96%  Weight:   92.7 kg   Height:        General: 64 y.o. male resting in bed in NAD Eyes: PERRL, normal sclera ENMT: Nares patent w/o discharge, orophaynx clear, dentition normal, ears w/o discharge/lesions/ulcers Neck: Supple, trachea midline Cardiovascular: RRR, +S1, S2, no m/g/r, equal pulses throughout Respiratory: CTABL, no w/r/r, normal WOB GI: BS+, NDNT, no masses noted, no organomegaly noted MSK: No e/c/c Skin: No rashes, bruises, ulcerations noted Neuro: A&O x 3, no focal deficits Psyc: Appropriate interaction and affect, calm/cooperative   The results of significant diagnostics from this hospitalization (including imaging, microbiology, ancillary and laboratory) are listed below for reference.     Microbiology: No results found for this or any previous visit (from the past 240 hour(s)).   Labs: BNP (last 3 results) Recent Labs    06/18/20 1945 06/19/20 0120 08/03/20 1440  BNP 444.2* 478.8* 007.6*   Basic Metabolic Panel: Recent Labs  Lab 08/03/20 1612 08/03/20 1655 08/04/20 0920 08/05/20 0208  NA 138  --  138 139  K 3.5  --  3.9 3.8  CL 107  --  107 106  CO2 22  --  23 22  GLUCOSE 107*  --  126* 103*  BUN 16  --  19 18  CREATININE 1.11  --  1.29* 1.29*  CALCIUM 8.5*  --  8.7* 8.6*  MG  --  1.8  --  1.9   Liver Function Tests: No results for input(s): AST, ALT, ALKPHOS, BILITOT, PROT, ALBUMIN in the last 168 hours. No results for input(s): LIPASE, AMYLASE in the last 168 hours. No results for input(s): AMMONIA in the last 168  hours. CBC: Recent Labs  Lab 08/03/20 1440 08/05/20 0208  WBC 7.0 6.2  NEUTROABS 3.2 2.2  HGB 15.6 14.8  HCT 48.1 45.4  MCV 86.0 85.5  PLT 280 264   Cardiac Enzymes: No results for input(s): CKTOTAL, CKMB, CKMBINDEX, TROPONINI in the last 168 hours. BNP: Invalid input(s): POCBNP CBG: No results for input(s): GLUCAP in the last 168 hours. D-Dimer No results for input(s): DDIMER in the last 72 hours. Hgb A1c No results for input(s): HGBA1C in the last 72 hours. Lipid Profile No results for input(s): CHOL, HDL, LDLCALC, TRIG, CHOLHDL, LDLDIRECT in the last 72 hours. Thyroid function studies No results for input(s): TSH, T4TOTAL, T3FREE, THYROIDAB in the  last 72 hours.  Invalid input(s): FREET3 Anemia work up No results for input(s): VITAMINB12, FOLATE, FERRITIN, TIBC, IRON, RETICCTPCT in the last 72 hours. Urinalysis No results found for: COLORURINE, APPEARANCEUR, LABSPEC, [place], GLUCOSEU, HGBUR, BILIRUBINUR, KETONESUR, PROTEINUR, UROBILINOGEN, NITRITE, LEUKOCYTESUR Sepsis Labs Invalid input(s): PROCALCITONIN,  WBC,  LACTICIDVEN Microbiology No results found for this or any previous visit (from the past 240 hour(s)).   Time coordinating discharge: 40 minutes  SIGNED:   Jonnie Finner, DO  [facility] 08/05/2020, 10:13 AM   If 7PM-7AM, please contact night-coverage [email or web address]

## 2020-08-11 ENCOUNTER — Telehealth: Payer: Self-pay | Admitting: Family Medicine

## 2020-08-11 NOTE — Telephone Encounter (Signed)
lori rn with [facility] is requesting verbal order for home health nursing 1x8

## 2020-08-11 NOTE — Telephone Encounter (Signed)
Called Christopher Wilkins/ requested verbal orders to continue ome health/ pt was discharged from hospital/ orders given. Please advice if anything else is needed?

## 2020-08-11 NOTE — Telephone Encounter (Signed)
I have not seen patient since his recent hospitalization, not sure what else may be needed at this time.

## 2020-08-13 ENCOUNTER — Ambulatory Visit (HOSPITAL_COMMUNITY)
Admission: EM | Admit: 2020-08-13 | Discharge: 2020-08-13 | Payer: Medicare HMO | Attending: Family Medicine | Admitting: Family Medicine

## 2020-08-13 ENCOUNTER — Ambulatory Visit (HOSPITAL_COMMUNITY): Payer: Medicare HMO

## 2020-08-13 ENCOUNTER — Inpatient Hospital Stay (HOSPITAL_COMMUNITY)
Admission: EM | Admit: 2020-08-13 | Discharge: 2020-08-21 | DRG: 286 | Disposition: A | Discharge status: Home Health | Payer: Medicare HMO | Attending: Internal Medicine | Admitting: Internal Medicine

## 2020-08-13 ENCOUNTER — Emergency Department (HOSPITAL_COMMUNITY): Payer: Medicare HMO

## 2020-08-13 ENCOUNTER — Other Ambulatory Visit: Payer: Self-pay

## 2020-08-13 ENCOUNTER — Encounter (HOSPITAL_COMMUNITY): Payer: Self-pay | Admitting: Emergency Medicine

## 2020-08-13 ENCOUNTER — Encounter (HOSPITAL_COMMUNITY): Payer: Self-pay

## 2020-08-13 ENCOUNTER — Ambulatory Visit (INDEPENDENT_AMBULATORY_CARE_PROVIDER_SITE_OTHER): Payer: Medicare HMO

## 2020-08-13 DIAGNOSIS — Z20822 Contact with and (suspected) exposure to covid-19: Secondary | ICD-10-CM | POA: Diagnosis present

## 2020-08-13 DIAGNOSIS — Z87891 Personal history of nicotine dependence: Secondary | ICD-10-CM

## 2020-08-13 DIAGNOSIS — Z6832 Body mass index (BMI) 32.0-32.9, adult: Secondary | ICD-10-CM

## 2020-08-13 DIAGNOSIS — I5043 Acute on chronic combined systolic (congestive) and diastolic (congestive) heart failure: Secondary | ICD-10-CM | POA: Diagnosis present

## 2020-08-13 DIAGNOSIS — E669 Obesity, unspecified: Secondary | ICD-10-CM | POA: Diagnosis present

## 2020-08-13 DIAGNOSIS — I509 Heart failure, unspecified: Secondary | ICD-10-CM

## 2020-08-13 DIAGNOSIS — I4819 Other persistent atrial fibrillation: Secondary | ICD-10-CM | POA: Diagnosis present

## 2020-08-13 DIAGNOSIS — Z91013 Allergy to seafood: Secondary | ICD-10-CM

## 2020-08-13 DIAGNOSIS — I130 Hypertensive heart and chronic kidney disease with heart failure and stage 1 through stage 4 chronic kidney disease, or unspecified chronic kidney disease: Principal | ICD-10-CM | POA: Diagnosis present

## 2020-08-13 DIAGNOSIS — R0602 Shortness of breath: Secondary | ICD-10-CM

## 2020-08-13 DIAGNOSIS — I4891 Unspecified atrial fibrillation: Secondary | ICD-10-CM | POA: Diagnosis not present

## 2020-08-13 DIAGNOSIS — E876 Hypokalemia: Secondary | ICD-10-CM | POA: Diagnosis present

## 2020-08-13 DIAGNOSIS — R570 Cardiogenic shock: Secondary | ICD-10-CM | POA: Diagnosis present

## 2020-08-13 DIAGNOSIS — N182 Chronic kidney disease, stage 2 (mild): Secondary | ICD-10-CM | POA: Diagnosis present

## 2020-08-13 DIAGNOSIS — I472 Ventricular tachycardia: Secondary | ICD-10-CM | POA: Diagnosis present

## 2020-08-13 DIAGNOSIS — N179 Acute kidney failure, unspecified: Secondary | ICD-10-CM | POA: Diagnosis present

## 2020-08-13 DIAGNOSIS — Z7901 Long term (current) use of anticoagulants: Secondary | ICD-10-CM

## 2020-08-13 DIAGNOSIS — Z23 Encounter for immunization: Secondary | ICD-10-CM | POA: Diagnosis not present

## 2020-08-13 DIAGNOSIS — E872 Acidosis: Secondary | ICD-10-CM | POA: Diagnosis present

## 2020-08-13 DIAGNOSIS — I10 Essential (primary) hypertension: Secondary | ICD-10-CM | POA: Diagnosis present

## 2020-08-13 DIAGNOSIS — R739 Hyperglycemia, unspecified: Secondary | ICD-10-CM | POA: Diagnosis present

## 2020-08-13 DIAGNOSIS — Z79899 Other long term (current) drug therapy: Secondary | ICD-10-CM | POA: Diagnosis not present

## 2020-08-13 DIAGNOSIS — R0682 Tachypnea, not elsewhere classified: Secondary | ICD-10-CM

## 2020-08-13 DIAGNOSIS — Z95828 Presence of other vascular implants and grafts: Secondary | ICD-10-CM

## 2020-08-13 DIAGNOSIS — I5023 Acute on chronic systolic (congestive) heart failure: Secondary | ICD-10-CM | POA: Diagnosis not present

## 2020-08-13 DIAGNOSIS — I428 Other cardiomyopathies: Secondary | ICD-10-CM | POA: Diagnosis present

## 2020-08-13 HISTORY — DX: Dyspnea, unspecified: R06.00

## 2020-08-13 HISTORY — DX: Heart failure, unspecified: I50.9

## 2020-08-13 HISTORY — DX: Insomnia, unspecified: G47.00

## 2020-08-13 LAB — CBC
HCT: 50.9 % (ref 39.0–52.0)
Hemoglobin: 16.3 g/dL (ref 13.0–17.0)
MCH: 27.9 pg (ref 26.0–34.0)
MCHC: 32 g/dL (ref 30.0–36.0)
MCV: 87.2 fL (ref 80.0–100.0)
Platelets: 288 10*3/uL (ref 150–400)
RBC: 5.84 MIL/uL — ABNORMAL HIGH (ref 4.22–5.81)
RDW: 14.8 % (ref 11.5–15.5)
WBC: 6.3 10*3/uL (ref 4.0–10.5)
nRBC: 0 % (ref 0.0–0.2)

## 2020-08-13 LAB — BASIC METABOLIC PANEL
Anion gap: 10 (ref 5–15)
BUN: 19 mg/dL (ref 8–23)
CO2: 22 mmol/L (ref 22–32)
Calcium: 9.1 mg/dL (ref 8.9–10.3)
Chloride: 109 mmol/L (ref 98–111)
Creatinine, Ser: 1.4 mg/dL — ABNORMAL HIGH (ref 0.61–1.24)
GFR, Estimated: 56 mL/min — ABNORMAL LOW (ref >60)
Glucose, Bld: 97 mg/dL (ref 70–99)
Potassium: 3.5 mmol/L (ref 3.5–5.1)
Sodium: 141 mmol/L (ref 135–145)

## 2020-08-13 LAB — TROPONIN I (HIGH SENSITIVITY): Troponin I (High Sensitivity): 23 ng/L — ABNORMAL HIGH (ref <18)

## 2020-08-13 LAB — BRAIN NATRIURETIC PEPTIDE: B Natriuretic Peptide: 920.3 pg/mL — ABNORMAL HIGH (ref 0.0–100.0)

## 2020-08-13 LAB — RESPIRATORY PANEL BY RT PCR (FLU A&B, COVID)
Influenza A by PCR: NEGATIVE
Influenza B by PCR: NEGATIVE
SARS Coronavirus 2 by RT PCR: NEGATIVE

## 2020-08-13 MED ORDER — ACETAMINOPHEN 650 MG RE SUPP: 650.0000 mg | Freq: Four times a day (QID) | RECTAL | Status: DC | PRN

## 2020-08-13 MED ORDER — APIXABAN 5 MG PO TABS
5.0000 mg | ORAL_TABLET | Freq: Two times a day (BID) | ORAL | Status: DC
  Administered 2020-08-13 – 2020-08-14 (×2): 5 mg via ORAL
  Filled 2020-08-13 (×2): qty 1

## 2020-08-13 MED ORDER — FUROSEMIDE 10 MG/ML IJ SOLN
40.0000 mg | Freq: Once | INTRAMUSCULAR | Status: AC
  Administered 2020-08-13: 40 mg via INTRAVENOUS
  Filled 2020-08-13: qty 4

## 2020-08-13 MED ORDER — ACETAMINOPHEN 325 MG PO TABS: 650.0000 mg | ORAL_TABLET | Freq: Four times a day (QID) | ORAL | Status: DC | PRN

## 2020-08-13 MED ORDER — SODIUM CHLORIDE 0.9% FLUSH
3.0000 mL | Freq: Two times a day (BID) | INTRAVENOUS | Status: DC
  Administered 2020-08-13 – 2020-08-16 (×5): 3 mL via INTRAVENOUS

## 2020-08-13 MED ORDER — RAMELTEON 8 MG PO TABS
8.0000 mg | ORAL_TABLET | Freq: Every day | ORAL | Status: DC
  Administered 2020-08-14 – 2020-08-20 (×8): 8 mg via ORAL
  Filled 2020-08-13 (×9): qty 1

## 2020-08-13 MED ORDER — METOPROLOL SUCCINATE ER 100 MG PO TB24
100.0000 mg | ORAL_TABLET | Freq: Every day | ORAL | Status: DC
  Administered 2020-08-14 – 2020-08-15 (×2): 100 mg via ORAL
  Filled 2020-08-13 (×2): qty 1

## 2020-08-13 MED ORDER — FUROSEMIDE 10 MG/ML IJ SOLN
40.0000 mg | Freq: Two times a day (BID) | INTRAMUSCULAR | Status: DC
  Administered 2020-08-14: 40 mg via INTRAVENOUS
  Filled 2020-08-13: qty 4

## 2020-08-13 NOTE — ED Provider Notes (Addendum)
[facility]   [id number] 08/13/20 Arrival Time: 1237  ASSESSMENT & PLAN:  1. SOB (shortness of breath)   2. Atrial fibrillation with rapid ventricular response ([place])   3. Tachypnea     Discharged from hospital on 08/08/2020. Discharge summary reviewed. He certainly has several reasons for being SOB; A. Fib; CHF, questionable early infiltrate of RLL. Given presentation and tachypnea, ED evaluation warranted. Transferred via EMS. Stable upon discharge.   ECG: Performed today and interpreted by me: A.fib.  I have personally viewed the imaging studies ordered this visit. Question early infiltrate of RLL.   Reviewed expectations re: course of current medical issues. Questions answered. Outlined signs and symptoms indicating need for more acute intervention. Patient verbalized understanding. After Visit Summary given.   SUBJECTIVE:  History from: patient. Christopher Wilkins is a 64 y.o. male who was recently discharged from hospital on 08/08/2020 presents with complaint of feeling SOB since late last evening; fairly abrupt onset and now persistent. Without n/v/CP. Afebrile. SOB worse with exertion and supine position. No significant LE edema. Taking medications as prescribed at hospital discharge.  Social History   Tobacco Use  Smoking Status Current Every Day Smoker  . Packs/day: 0.25  . Types: Cigarettes  Smokeless Tobacco Never Used    OBJECTIVE:  Vitals:   08/13/20 1243  BP: (!) 120/94  Pulse: (!) 52  Resp: (!) 32  Temp: (!) 97.5 F (36.4 C)  TempSrc: Temporal  SpO2: 96%   Bradycardia and RR noted. Appears uncomfortable and speaks in very short sentences.  Eyes: PERRLA; EOMI; conjunctivae normal HENT: normocephalic; atraumatic Neck: supple with FROM Lungs: with labored respirations; slightly coarse breath sounds bilaterally; no wheezing; question slight rales on R Heart: irregularly regular Abdomen: soft, non-tender Extremities: without edema;  without calf swelling or tenderness; symmetrical without gross deformities Skin: warm and dry; without rash or lesions Neuro: normal gait Psychological: alert and cooperative; normal mood and affect  Labs Reviewed: Results for orders placed or performed during the hospital encounter of 08/03/20  CBC with Differential  Result Value Ref Range   WBC 7.0 4.0 - 10.5 K/uL   RBC 5.59 4.22 - 5.81 MIL/uL   Hemoglobin 15.6 13.0 - 17.0 g/dL   HCT 48.1 39 - 52 %   MCV 86.0 80.0 - 100.0 fL   MCH 27.9 26.0 - 34.0 pg   MCHC 32.4 30.0 - 36.0 g/dL   RDW 14.8 11.5 - 15.5 %   Platelets 280 150 - 400 K/uL   nRBC 0.0 0.0 - 0.2 %   Neutrophils Relative % 46 %   Neutro Abs 3.2 1.7 - 7.7 K/uL   Lymphocytes Relative 42 %   Lymphs Abs 2.9 0.7 - 4.0 K/uL   Monocytes Relative 10 %   Monocytes Absolute 0.7 0.1 - 1.0 K/uL   Eosinophils Relative 1 %   Eosinophils Absolute 0.1 0.0 - 0.5 K/uL   Basophils Relative 1 %   Basophils Absolute 0.1 0.0 - 0.1 K/uL   Immature Granulocytes 0 %   Abs Immature Granulocytes 0.03 0.00 - 0.07 K/uL  Brain natriuretic peptide  Result Value Ref Range   B Natriuretic Peptide 411.6 (H) 0.0 - 100.0 pg/mL  Basic metabolic panel  Result Value Ref Range   Sodium 138 135 - 145 mmol/L   Potassium 3.5 3.5 - 5.1 mmol/L   Chloride 107 98 - 111 mmol/L   CO2 22 22 - 32 mmol/L   Glucose, Bld 107 (H) 70 - 99 mg/dL  BUN 16 8 - 23 mg/dL   Creatinine, Ser 1.11 0.61 - 1.24 mg/dL   Calcium 8.5 (L) 8.9 - 10.3 mg/dL   GFR, Estimated >60 >60 mL/min   Anion gap 9 5 - 15  Magnesium  Result Value Ref Range   Magnesium 1.8 1.7 - 2.4 mg/dL  Basic metabolic panel  Result Value Ref Range   Sodium 138 135 - 145 mmol/L   Potassium 3.9 3.5 - 5.1 mmol/L   Chloride 107 98 - 111 mmol/L   CO2 23 22 - 32 mmol/L   Glucose, Bld 126 (H) 70 - 99 mg/dL   BUN 19 8 - 23 mg/dL   Creatinine, Ser 1.29 (H) 0.61 - 1.24 mg/dL   Calcium 8.7 (L) 8.9 - 10.3 mg/dL   GFR, Estimated >60 >60 mL/min   Anion gap 8  5 - 15  Basic metabolic panel  Result Value Ref Range   Sodium 139 135 - 145 mmol/L   Potassium 3.8 3.5 - 5.1 mmol/L   Chloride 106 98 - 111 mmol/L   CO2 22 22 - 32 mmol/L   Glucose, Bld 103 (H) 70 - 99 mg/dL   BUN 18 8 - 23 mg/dL   Creatinine, Ser 1.29 (H) 0.61 - 1.24 mg/dL   Calcium 8.6 (L) 8.9 - 10.3 mg/dL   GFR, Estimated >60 >60 mL/min   Anion gap 11 5 - 15  CBC with Differential/Platelet  Result Value Ref Range   WBC 6.2 4.0 - 10.5 K/uL   RBC 5.31 4.22 - 5.81 MIL/uL   Hemoglobin 14.8 13.0 - 17.0 g/dL   HCT 45.4 39 - 52 %   MCV 85.5 80.0 - 100.0 fL   MCH 27.9 26.0 - 34.0 pg   MCHC 32.6 30.0 - 36.0 g/dL   RDW 14.6 11.5 - 15.5 %   Platelets 264 150 - 400 K/uL   nRBC 0.0 0.0 - 0.2 %   Neutrophils Relative % 36 %   Neutro Abs 2.2 1.7 - 7.7 K/uL   Lymphocytes Relative 52 %   Lymphs Abs 3.3 0.7 - 4.0 K/uL   Monocytes Relative 10 %   Monocytes Absolute 0.6 0.1 - 1.0 K/uL   Eosinophils Relative 1 %   Eosinophils Absolute 0.1 0.0 - 0.5 K/uL   Basophils Relative 1 %   Basophils Absolute 0.1 0.0 - 0.1 K/uL   Immature Granulocytes 0 %   Abs Immature Granulocytes 0.01 0.00 - 0.07 K/uL  Magnesium  Result Value Ref Range   Magnesium 1.9 1.7 - 2.4 mg/dL  Troponin I (High Sensitivity)  Result Value Ref Range   Troponin I (High Sensitivity) 21 (H) <18 ng/L  Troponin I (High Sensitivity)  Result Value Ref Range   Troponin I (High Sensitivity) 27 (H) <18 ng/L   Labs Reviewed - No data to display  Imaging: DG Chest 2 View  Result Date: 08/13/2020 CLINICAL DATA:  Shortness of breath. EXAM: CHEST - 2 VIEW COMPARISON:  August 03, 2020. FINDINGS: Stable cardiomediastinal silhouette. No pneumothorax is noted. Left lung is clear. Minimal right pleural effusion is noted. Minimal right basilar atelectasis or infiltrate is noted. The visualized skeletal structures are unremarkable. IMPRESSION: Minimal right basilar atelectasis or infiltrate is noted with minimal right pleural effusion.  Electronically Signed   By: Marijo Conception M.D.   On: 08/13/2020 13:13     Allergies  Allergen Reactions  . Codeine Nausea And Vomiting  . Lisinopril Other (See Comments)    "Didn't do well  in my body"  . Shellfish Allergy Other (See Comments)    Can tolerate shrimp in 2021 (??)    Past Medical History:  Diagnosis Date  . Atrial fibrillation ([place])   . Hypertension    Social History   Socioeconomic History  . Marital status: Single    Spouse name: Not on file  . Number of children: Not on file  . Years of education: Not on file  . Highest education level: Not on file  Occupational History  . Not on file  Tobacco Use  . Smoking status: Current Every Day Smoker    Packs/day: 0.25    Types: Cigarettes  . Smokeless tobacco: Never Used  Vaping Use  . Vaping Use: Some days  Substance and Sexual Activity  . Alcohol use: Yes    Alcohol/week: 0.0 standard drinks    Comment: occasionally; once every 2 weeks  . Drug use: No  . Sexual activity: Not on file  Other Topics Concern  . Not on file  Social History Narrative  . Not on file   Social Determinants of Health   Financial Resource Strain:   . Difficulty of Paying Living Expenses: Not on file  Food Insecurity:   . Worried About Charity fundraiser in the Last Year: Not on file  . Ran Out of Food in the Last Year: Not on file  Transportation Needs:   . Lack of Transportation (Medical): Not on file  . Lack of Transportation (Non-Medical): Not on file  Physical Activity:   . Days of Exercise per Week: Not on file  . Minutes of Exercise per Session: Not on file  Stress:   . Feeling of Stress : Not on file  Social Connections:   . Frequency of Communication with Friends and Family: Not on file  . Frequency of Social Gatherings with Friends and Family: Not on file  . Attends Religious Services: Not on file  . Active Member of Clubs or Organizations: Not on file  . Attends Archivist Meetings: Not on file    . Marital Status: Not on file  Intimate Partner Violence:   . Fear of Current or Ex-Partner: Not on file  . Emotionally Abused: Not on file  . Physically Abused: Not on file  . Sexually Abused: Not on file   Family History  Problem Relation Age of Onset  . Colon cancer Neg Hx    Past Surgical History:  Procedure Laterality Date  . CARDIOVERSION N/A 06/20/2020   Procedure: CARDIOVERSION;  Surgeon: Jerline Pain, MD;  Location: [facility] ENDOSCOPY;  Service: Cardiovascular;  Laterality: N/A;  . CHOLECYSTECTOMY    . OTHER SURGICAL HISTORY     "Shot a couple of times"  . TEE WITHOUT CARDIOVERSION N/A 06/20/2020   Procedure: TRANSESOPHAGEAL ECHOCARDIOGRAM (TEE);  Surgeon: Jerline Pain, MD;  Location: [facility] ENDOSCOPY;  Service: Cardiovascular;  Laterality: N/AVanessa Kick, MD 08/13/20 [place]    Vanessa Kick, MD 08/13/20 1356

## 2020-08-13 NOTE — ED Provider Notes (Signed)
[facility] EMERGENCY DEPARTMENT Provider Note   CSN: [id number] Arrival date & time: 08/13/20  1525     History Chief Complaint  Patient presents with  . Atrial Fibrillation    Christopher Wilkins is a 64 y.o. male history of A. fib on Eliquis, CHF, hypertension here presenting with shortness of breath.  Was recently admitted for CHF exacerbation.  Patient was discharged home several days ago.  He states that since discharge, he has been having progressively worsening shortness of breath.  He states that he was so short of breath yesterday he was unable to fall asleep.  He also had about 7 pound weight gain since discharge.  Patient states that he is compliant with his Lasix at home.  Patient went to urgent care was sent here for further evaluation today.  Denies any fevers or chills.   The history is provided by the patient.       Past Medical History:  Diagnosis Date  . Atrial fibrillation ([place])   . Hypertension     Patient Active Problem List   Diagnosis Date Noted  . A-fib ([place]) 08/03/2020  . CHF (congestive heart failure) ([place]) 08/03/2020  . Acute CHF ([place]) 07/02/2020  . Cardiomyopathy ([place]) 07/02/2020  . Anticoagulated 07/02/2020  . Essential hypertension 07/02/2020  . Atrial fibrillation with RVR ([place]) 06/18/2020  . Angioedema 04/19/2019  . AKI (acute kidney injury) [facility])     Past Surgical History:  Procedure Laterality Date  . CARDIOVERSION N/A 06/20/2020   Procedure: CARDIOVERSION;  Surgeon: Jerline Pain, MD;  Location: [facility] ENDOSCOPY;  Service: Cardiovascular;  Laterality: N/A;  . CHOLECYSTECTOMY    . OTHER SURGICAL HISTORY     "Shot a couple of times"  . TEE WITHOUT CARDIOVERSION N/A 06/20/2020   Procedure: TRANSESOPHAGEAL ECHOCARDIOGRAM (TEE);  Surgeon: Jerline Pain, MD;  Location: [facility] ENDOSCOPY;  Service: Cardiovascular;  Laterality: N/A;       Family History  Problem Relation Age of Onset  . Colon cancer Neg Hx     Social History    Tobacco Use  . Smoking status: Current Every Day Smoker    Packs/day: 0.25    Types: Cigarettes  . Smokeless tobacco: Never Used  Vaping Use  . Vaping Use: Some days  Substance Use Topics  . Alcohol use: Yes    Alcohol/week: 0.0 standard drinks    Comment: occasionally; once every 2 weeks  . Drug use: No    Home Medications Prior to Admission medications   Medication Sig Start Date End Date Taking? Authorizing Provider  apixaban (ELIQUIS) 5 MG TABS tablet Take 1 tablet (5 mg total) by mouth 2 (two) times daily. 07/11/20   Fulp, Cammie, MD  furosemide (LASIX) 40 MG tablet Take 1 tablet (40 mg total) by mouth daily. 08/06/20 09/05/20  Cherylann Ratel A, DO  metoprolol succinate (TOPROL XL) 100 MG 24 hr tablet Take 1 tablet (100 mg total) by mouth daily. Take with or immediately following a meal. 08/05/20 09/04/20  Cherylann Ratel A, DO    Allergies    Codeine, Lisinopril, and Shellfish allergy  Review of Systems   Review of Systems  Respiratory: Positive for shortness of breath.   All other systems reviewed and are negative.   Physical Exam Updated Vital Signs BP (!) 136/108 (BP Location: Left Arm)   Pulse (!) 49   Temp (!) 97.3 F (36.3 C) (Oral)   Resp (!) 24   SpO2 98%   Physical Exam Vitals  and nursing note reviewed.  Constitutional:      Appearance: Normal appearance.  HENT:     Head: Normocephalic.     Nose: Nose normal.     Mouth/Throat:     Mouth: Mucous membranes are moist.  Eyes:     Extraocular Movements: Extraocular movements intact.     Pupils: Pupils are equal, round, and reactive to light.  Cardiovascular:     Rate and Rhythm: Normal rate and regular rhythm.     Pulses: Normal pulses.     Heart sounds: Normal heart sounds.  Pulmonary:     Comments: Diminished bilateral bases  Abdominal:     General: Abdomen is flat.     Palpations: Abdomen is soft.  Musculoskeletal:     Cervical back: Normal range of motion.     Comments: 1+ edema   Skin:     General: Skin is warm.     Capillary Refill: Capillary refill takes less than 2 seconds.  Neurological:     General: No focal deficit present.     Mental Status: He is alert and oriented to person, place, and time.  Psychiatric:        Mood and Affect: Mood normal.        Behavior: Behavior normal.     ED Results / Procedures / Treatments   Labs (all labs ordered are listed, but only abnormal results are displayed) Labs Reviewed  BASIC METABOLIC PANEL - Abnormal; Notable for the following components:      Result Value   Creatinine, Ser 1.40 (*)    GFR, Estimated 56 (*)    All other components within normal limits  CBC - Abnormal; Notable for the following components:   RBC 5.84 (*)    All other components within normal limits  RESPIRATORY PANEL BY RT PCR (FLU A&B, COVID)  BRAIN NATRIURETIC PEPTIDE  TROPONIN I (HIGH SENSITIVITY)    EKG EKG Interpretation  Date/Time:  Wednesday August 13 2020 16:41:00 EDT Ventricular Rate:  94 PR Interval:    QRS Duration: 100 QT Interval:  366 QTC Calculation: 457 R Axis:   62 Text Interpretation: Atrial fibrillation with premature ventricular or aberrantly conducted complexes Minimal voltage criteria for LVH, may be normal variant ( Cornell product ) Nonspecific T wave abnormality Abnormal ECG No significant change since last tracing Confirmed by Wandra Arthurs [phone]) on 08/13/2020 5:00:05 PM   Radiology DG Chest 2 View  Result Date: 08/13/2020 CLINICAL DATA:  Shortness of breath EXAM: CHEST - 2 VIEW COMPARISON:  August 13, 2020 FINDINGS: The cardiomediastinal silhouette is unchanged and enlarged in contour. Small RIGHT pleural effusion. No pneumothorax. Diffuse interstitial prominence and peribronchial cuffing, mildly increased in conspicuity in comparison to prior. RIGHT basilar heterogeneous opacity, likely atelectasis. Prominent azygos shadow. Visualized abdomen is unremarkable. Multilevel degenerative changes of the thoracic spine.  IMPRESSION: Constellation of findings are favored to reflect pulmonary edema with a small RIGHT pleural effusion and scattered atelectasis. Differential considerations include atypical infection. Electronically Signed   By: Valentino Saxon MD   On: 08/13/2020 17:08   DG Chest 2 View  Result Date: 08/13/2020 CLINICAL DATA:  Shortness of breath. EXAM: CHEST - 2 VIEW COMPARISON:  August 03, 2020. FINDINGS: Stable cardiomediastinal silhouette. No pneumothorax is noted. Left lung is clear. Minimal right pleural effusion is noted. Minimal right basilar atelectasis or infiltrate is noted. The visualized skeletal structures are unremarkable. IMPRESSION: Minimal right basilar atelectasis or infiltrate is noted with minimal right pleural effusion. Electronically  Signed   By: Marijo Conception M.D.   On: 08/13/2020 13:13    Procedures Procedures (including critical care time)  Medications Ordered in ED Medications - No data to display  ED Course  I have reviewed the triage vital signs and the nursing notes.  Pertinent labs & imaging results that were available during my care of the patient were reviewed by me and considered in my medical decision making (see chart for details).    MDM Rules/Calculators/A&P                         Christopher Wilkins is a 64 y.o. male here with worsening shortness of breath and weight gain.  Likely CHF exacerbation.  Patient is not hypoxic in the ED. we will check CBC and BMP and troponin and BNP.  We will get a chest x-ray and Covid test and give Lasix.  7:07 PM CXR showed pulm edema.  Labs show elevated BNP and 900.  Given IV Lasix.  He is still tachypneic in the mid 91s.  Will admit for CHF exacerbation.   Final Clinical Impression(s) / ED Diagnoses Final diagnoses:  None    Rx / DC Orders ED Discharge Orders    None       Drenda Freeze, MD 08/13/20 1907

## 2020-08-13 NOTE — ED Notes (Signed)
Called [facility]

## 2020-08-13 NOTE — ED Triage Notes (Signed)
Pt with hx of afib, c/o shortness of breath, worse with exertion x1 day.

## 2020-08-13 NOTE — ED Triage Notes (Signed)
Pt presents with shortness of breath since yesterday. 

## 2020-08-13 NOTE — H&P (Signed)
History and Physical   Christopher Wilkins YBO:[id number] DOB: [birth year] DOA: 08/13/2020  PCP: Rogers Blocker, MD   Patient coming from: Home  Chief Complaint: Dyspnea, edema  HPI: Christopher Wilkins is a 64 y.o. male with medical history significant of atrial fibrillation, hypertension, CHF (EF 30-35% on 06/20/2020) who presents with worsening dyspnea and lower extremity edema. Patient recently admitted from 10/24-10/26 for heart failure exacerbation believed to be secondary to atrial fibrillation which was not rate controlled. He is metoprolol was increased to 100 mg daily at that time with improvement of his rate. He states that since discharge he has had progressively worsening dyspnea, edema, DOE, and orthopnea. He reports about a 7 pound weight gain this time. He states he has been taking his medications that were prescribed on discharge including taking Lasix daily. He reports some cough worse with lying down. He denies chest pain, abdominal pain, nausea, vomiting, constipation/diarrhea.  ED Course: Vital signs significant for tachypnea in the 20s and heart rate intermittently in the 100s to 110s. Lab work showed creatinine stable at 1.4. CBC without abnormality. Troponin stable at 23 on initial with repeat pending. BNP elevated to 920 (previously 400 on last admission). Respiratory panel pending. Patient was given a dose of Lasix.  Review of Systems: As per HPI otherwise all other systems reviewed and are negative.  Past Medical History:  Diagnosis Date  . Atrial fibrillation ([place])   . Hypertension     Past Surgical History:  Procedure Laterality Date  . CARDIOVERSION N/A 06/20/2020   Procedure: CARDIOVERSION;  Surgeon: Jerline Pain, MD;  Location[facility] ENDOSCOPY;  Service: Cardiovascular;  Laterality: N/A;  . CHOLECYSTECTOMY    . OTHER SURGICAL HISTORY     "Shot a couple of times"  . TEE WITHOUT CARDIOVERSION N/A 06/20/2020   Procedure: TRANSESOPHAGEAL ECHOCARDIOGRAM (TEE);  Surgeon: Jerline Pain, MD;  Location: [facility] ENDOSCOPY;  Service: Cardiovascular;  Laterality: N/A;    Social History  reports that he has been smoking cigarettes. He has been smoking about 0.25 packs per day. He has never used smokeless tobacco. He reports current alcohol use. He reports that he does not use drugs.  Allergies  Allergen Reactions  . Codeine Nausea And Vomiting  . Lisinopril Other (See Comments)    "Didn't do well in my body"  . Shellfish Allergy Other (See Comments)    Can tolerate shrimp in 2021 (??)    Family History  Problem Relation Age of Onset  . Colon cancer Neg Hx   Confirmed on admission  Prior to Admission medications   Medication Sig Start Date End Date Taking? Authorizing Provider  apixaban (ELIQUIS) 5 MG TABS tablet Take 1 tablet (5 mg total) by mouth 2 (two) times daily. 07/11/20   Fulp, Cammie, MD  furosemide (LASIX) 40 MG tablet Take 1 tablet (40 mg total) by mouth daily. 08/06/20 09/05/20  Cherylann Ratel A, DO  metoprolol succinate (TOPROL XL) 100 MG 24 hr tablet Take 1 tablet (100 mg total) by mouth daily. Take with or immediately following a meal. 08/05/20 09/04/20  Jonnie Finner, DO    Physical Exam: Vitals:   08/14/20 0100 08/14/20 0115 08/14/20 0130 08/14/20 0145  BP: 128/88 109/75 (!) 109/96 109/87  Pulse: (!) 46 66 (!) 58 (!) 49  Resp: (!) 0 (!) 31 (!) 0 (!) 34  Temp:      TempSrc:      SpO2: (!) 89% (!) 87% (!) 89% (!) 83%  Physical Exam Constitutional:      General: He is not in acute distress.    Appearance: Normal appearance.  HENT:     Head: Normocephalic and atraumatic.     Mouth/Throat:     Mouth: Mucous membranes are moist.     Pharynx: Oropharynx is clear.  Eyes:     Extraocular Movements: Extraocular movements intact.     Pupils: Pupils are equal, round, and reactive to light.  Cardiovascular:     Rate and Rhythm: Normal rate and regular rhythm.     Pulses: Normal pulses.     Heart sounds: Normal heart sounds.  Pulmonary:      Effort: Pulmonary effort is normal. No respiratory distress.     Breath sounds: Normal breath sounds.     Comments: Diminished breath sounds bilateral bases Abdominal:     General: Bowel sounds are normal. There is no distension.     Palpations: Abdomen is soft.     Tenderness: There is no abdominal tenderness.  Musculoskeletal:        General: No swelling or deformity.     Right lower leg: Edema present.     Left lower leg: Edema present.  Skin:    General: Skin is warm and dry.  Neurological:     General: No focal deficit present.     Mental Status: Mental status is at baseline.    Labs on Admission: I have personally reviewed following labs and imaging studies  CBC: Recent Labs  Lab 08/13/20 1634  WBC 6.3  HGB 16.3  HCT 50.9  MCV 87.2  PLT 341    Basic Metabolic Panel: Recent Labs  Lab 08/13/20 1634  NA 141  K 3.5  CL 109  CO2 22  GLUCOSE 97  BUN 19  CREATININE 1.40*  CALCIUM 9.1    GFR: Estimated Creatinine Clearance: 56.1 mL/min (A) (by C-G formula based on SCr of 1.4 mg/dL (H)).  Liver Function Tests: No results for input(s): AST, ALT, ALKPHOS, BILITOT, PROT, ALBUMIN in the last 168 hours.  Urine analysis: No results found for: COLORURINE, APPEARANCEUR, [place], [place], GLUCOSEU, [place], BILIRUBINUR, KETONESUR, PROTEINUR, [place], NITRITE, LEUKOCYTESUR  Radiological Exams on Admission: DG Chest 2 View  Result Date: 08/13/2020 CLINICAL DATA:  Shortness of breath EXAM: CHEST - 2 VIEW COMPARISON:  August 13, 2020 FINDINGS: The cardiomediastinal silhouette is unchanged and enlarged in contour. Small RIGHT pleural effusion. No pneumothorax. Diffuse interstitial prominence and peribronchial cuffing, mildly increased in conspicuity in comparison to prior. RIGHT basilar heterogeneous opacity, likely atelectasis. Prominent azygos shadow. Visualized abdomen is unremarkable. Multilevel degenerative changes of the thoracic spine. IMPRESSION: Constellation of  findings are favored to reflect pulmonary edema with a small RIGHT pleural effusion and scattered atelectasis. Differential considerations include atypical infection. Electronically Signed   By: Valentino Saxon MD   On: 08/13/2020 17:08   DG Chest 2 View  Result Date: 08/13/2020 CLINICAL DATA:  Shortness of breath. EXAM: CHEST - 2 VIEW COMPARISON:  August 03, 2020. FINDINGS: Stable cardiomediastinal silhouette. No pneumothorax is noted. Left lung is clear. Minimal right pleural effusion is noted. Minimal right basilar atelectasis or infiltrate is noted. The visualized skeletal structures are unremarkable. IMPRESSION: Minimal right basilar atelectasis or infiltrate is noted with minimal right pleural effusion. Electronically Signed   By: Marijo Conception M.D.   On: 08/13/2020 13:13    EKG: Independently reviewed. Atrial flutter with PVC, rate of 94  Assessment/Plan Principal Problem:   Acute exacerbation of CHF (congestive heart  failure) [facility]) Active Problems:   Essential hypertension   A-fib (HCC)  Acute on chronic CHF > Recent admission for the same thought to be due to poorly controlled A. fib, discharged 10/26 > Progressively worsening symptoms since discharge - Will not repeat echocardiogram given recent echo in September - Received a dose of IV Lasix in ED, continue IV Lasix 40 mg twice daily - Daily weights, strict I/O - Potassium WNL, add on magnesium  Atrial fibrillation > Metoprolol succinate recently increased to 100 mg daily during recent admission > Heart rate currently in the 90s to 110s in ED, will likely need further titration of beta-blocker or other rate control medication - Continue metoprolol and Eliquis  DVT prophylaxis: Eliquis  Code Status:   Full  Family Communication:  None on admission  Disposition Plan:   Patient is from:  Home  Anticipated DC to:  Home  Anticipated DC date:  11/5, pending clinical course    Consults called:  None  Admission status:    Inpatient, telemetry  Severity of Illness: The appropriate patient status for this patient is INPATIENT. Inpatient status is judged to be reasonable and necessary in order to provide the required intensity of service to ensure the patient's safety. The patient's presenting symptoms, physical exam findings, and initial radiographic and laboratory data in the context of their chronic comorbidities is felt to place them at high risk for further clinical deterioration. Furthermore, it is not anticipated that the patient will be medically stable for discharge from the hospital within 2 midnights of admission. The following factors support the patient status of inpatient.   " The patient's presenting symptoms include progressive dyspnea, edema, DOE. " The worrisome physical exam findings include decreased breath sounds, edema. " The initial radiographic and laboratory data are worrisome because of pulmonary edema on chest x-ray. " The chronic co-morbidities include atrial fibrillation, hypertension.   * I certify that at the point of admission it is my clinical judgment that the patient will require inpatient hospital care spanning beyond 2 midnights from the point of admission due to high intensity of service, high risk for further deterioration and high frequency of surveillance required.Marcelyn Bruins MD [facility]  How to contact the [facility] Attending or Consulting provider [place] or covering provider during after hours [place], for this patient?   1. Check the care team in [facility] and look for a) attending/consulting [facility] provider listed and b) the [facility] team listed 2. Log into [email or web address] and use [place]'s universal password to access. If you do not have the password, please contact the hospital operator. 3. Locate the [facility] provider you are looking for under [facility] and page to a number that you can be directly reached. 4. If you still have difficulty reaching the  provider, please page the [facility] (Director on Call) for the Hospitalists listed on amion for assistance.  08/14/2020, 2:00 AM

## 2020-08-13 NOTE — ED Notes (Signed)
Report was given to ED charge nurse Santiago Glad, pt left EMS to ED.

## 2020-08-14 ENCOUNTER — Encounter (HOSPITAL_COMMUNITY): Payer: Self-pay | Admitting: Internal Medicine

## 2020-08-14 DIAGNOSIS — I10 Essential (primary) hypertension: Secondary | ICD-10-CM

## 2020-08-14 DIAGNOSIS — I5023 Acute on chronic systolic (congestive) heart failure: Secondary | ICD-10-CM | POA: Diagnosis not present

## 2020-08-14 DIAGNOSIS — I4819 Other persistent atrial fibrillation: Secondary | ICD-10-CM

## 2020-08-14 DIAGNOSIS — I5043 Acute on chronic combined systolic (congestive) and diastolic (congestive) heart failure: Secondary | ICD-10-CM | POA: Diagnosis not present

## 2020-08-14 DIAGNOSIS — I4891 Unspecified atrial fibrillation: Secondary | ICD-10-CM | POA: Diagnosis not present

## 2020-08-14 LAB — CBC
HCT: 46.3 % (ref 39.0–52.0)
Hemoglobin: 15 g/dL (ref 13.0–17.0)
MCH: 28 pg (ref 26.0–34.0)
MCHC: 32.4 g/dL (ref 30.0–36.0)
MCV: 86.4 fL (ref 80.0–100.0)
Platelets: 277 10*3/uL (ref 150–400)
RBC: 5.36 MIL/uL (ref 4.22–5.81)
RDW: 14.6 % (ref 11.5–15.5)
WBC: 5 10*3/uL (ref 4.0–10.5)
nRBC: 0 % (ref 0.0–0.2)

## 2020-08-14 LAB — BASIC METABOLIC PANEL
Anion gap: 12 (ref 5–15)
BUN: 21 mg/dL (ref 8–23)
CO2: 21 mmol/L — ABNORMAL LOW (ref 22–32)
Calcium: 8.6 mg/dL — ABNORMAL LOW (ref 8.9–10.3)
Chloride: 107 mmol/L (ref 98–111)
Creatinine, Ser: 1.29 mg/dL — ABNORMAL HIGH (ref 0.61–1.24)
GFR, Estimated: >60 mL/min (ref >60)
Glucose, Bld: 95 mg/dL (ref 70–99)
Potassium: 3.1 mmol/L — ABNORMAL LOW (ref 3.5–5.1)
Sodium: 140 mmol/L (ref 135–145)

## 2020-08-14 LAB — HIV ANTIBODY (ROUTINE TESTING W REFLEX): HIV Screen 4th Generation wRfx: NONREACTIVE

## 2020-08-14 LAB — MAGNESIUM: Magnesium: 1.8 mg/dL (ref 1.7–2.4)

## 2020-08-14 MED ORDER — SODIUM CHLORIDE 0.9% FLUSH
3.0000 mL | Freq: Two times a day (BID) | INTRAVENOUS | Status: DC
  Administered 2020-08-14 (×2): 3 mL via INTRAVENOUS

## 2020-08-14 MED ORDER — AMIODARONE HCL 200 MG PO TABS
400.0000 mg | ORAL_TABLET | Freq: Two times a day (BID) | ORAL | Status: DC
  Administered 2020-08-14 – 2020-08-15 (×3): 400 mg via ORAL
  Filled 2020-08-14 (×3): qty 2

## 2020-08-14 MED ORDER — SODIUM CHLORIDE 0.9 % IV SOLN: 250.0000 mL | INTRAVENOUS | Status: DC | PRN

## 2020-08-14 MED ORDER — SODIUM CHLORIDE 0.9 % IV SOLN: INTRAVENOUS | Status: DC

## 2020-08-14 MED ORDER — FUROSEMIDE 10 MG/ML IJ SOLN: 40.0000 mg | Freq: Every day | INTRAMUSCULAR | Status: DC

## 2020-08-14 MED ORDER — WHITE PETROLATUM EX OINT
TOPICAL_OINTMENT | CUTANEOUS | Status: DC | PRN
  Filled 2020-08-14: qty 28.35

## 2020-08-14 MED ORDER — MAGNESIUM SULFATE 2 GM/50ML IV SOLN
2.0000 g | Freq: Once | INTRAVENOUS | Status: AC
  Administered 2020-08-14: 2 g via INTRAVENOUS
  Filled 2020-08-14: qty 50

## 2020-08-14 MED ORDER — PNEUMOCOCCAL VAC POLYVALENT 25 MCG/0.5ML IJ INJ
0.5000 mL | INJECTION | INTRAMUSCULAR | Status: AC
  Administered 2020-08-21: 0.5 mL via INTRAMUSCULAR
  Filled 2020-08-14: qty 0.5

## 2020-08-14 MED ORDER — AMIODARONE HCL 200 MG PO TABS: 200.0000 mg | ORAL_TABLET | Freq: Two times a day (BID) | ORAL | Status: DC

## 2020-08-14 MED ORDER — POTASSIUM CHLORIDE CRYS ER 20 MEQ PO TBCR
40.0000 meq | EXTENDED_RELEASE_TABLET | Freq: Two times a day (BID) | ORAL | Status: DC
  Administered 2020-08-14 – 2020-08-15 (×3): 40 meq via ORAL
  Filled 2020-08-14 (×3): qty 2

## 2020-08-14 MED ORDER — HEPARIN (PORCINE) 25000 UT/250ML-% IV SOLN
1500.0000 [IU]/h | INTRAVENOUS | Status: DC
  Administered 2020-08-14: 1200 [IU]/h via INTRAVENOUS
  Administered 2020-08-16 – 2020-08-17 (×3): 1500 [IU]/h via INTRAVENOUS
  Filled 2020-08-14 (×5): qty 250

## 2020-08-14 MED ORDER — ASPIRIN 81 MG PO CHEW
81.0000 mg | CHEWABLE_TABLET | ORAL | Status: AC
  Administered 2020-08-15: 81 mg via ORAL
  Filled 2020-08-14: qty 1

## 2020-08-14 MED ORDER — SODIUM CHLORIDE 0.9% FLUSH: 3.0000 mL | INTRAVENOUS | Status: DC | PRN

## 2020-08-14 NOTE — Progress Notes (Signed)
Patient arrived to room 6E16 in NAD, VS stable. Patient free from pain. Patient oriented to room, call bell in reach and bed in lowest position.

## 2020-08-14 NOTE — Consult Note (Addendum)
Cardiology Consultation:   Patient ID: Christopher Wilkins MRN: [id number]; DOB: 12/10/1955  Admit date: 08/13/2020 Date of Consult: 08/14/2020  Primary Care Provider: Rogers Blocker, MD [facility] Cardiologist: Donato Heinz, MD   Patient Profile:   Christopher Wilkins is a 64 y.o. male with a hx of PAF, chronic systolic and diastolic CHF, HTN, obesity and tobacco abuse who is being seen today for the evaluation of CHF at the request of Dr. Eliseo Squires.  Admitted 06/2020 with acute onset CHF and afib. Echo showed LVEF of 35-40%. Underwent TEE/DCCV with restoration of sinus rhythm. Severe LAE at risk for early recurrence. CHADS2-VASc score of 2. Discharged on Eliquis, Entresto, Toprol and lasix.   Seen in clinic 07/02/2020. Did not fill the prescription for Entresto since it was $800. History of recurrent angioedema with ACE and hospitalized July 2020  for suspected angioedema with near respiratory failure. Recommended not use Entresto. Given samples of Eliquis.   History of Present Illness:   Christopher Wilkins most recently admitted 10/26-10/26 for acute CHF exacerbation 2nd to afib RVR. His Toprol increased to 100mg  qd.  DC HCTZ and given lasix 40mg  qd. However presented with progressive worsening dyspnea, orthopnea and weight gain since discharge. Noted BNP of 920. Hs-troponin 23. Scr 1.4. Respiratory panel negative. Admitted and started on IV lasix for CHF exacerbation. Diuresed net negative 1.9L. No daily weight. Cardiology is asked for management of CHF.   Patient reports compliance with medications and low-sodium diet.  States he gets shortness of breath with ambulation without chest tightness or pressure.  Intermittent palpitation.  Poor historian.  Noted orthopnea and PND.  No chest pain.   Past Medical History:  Diagnosis Date  . Atrial fibrillation ([place])   . CHF (congestive heart failure) ([place])   . Dyspnea   . Hypertension   . Insomnia     Past Surgical History:  Procedure Laterality Date    . CARDIOVERSION N/A 06/20/2020   Procedure: CARDIOVERSION;  Surgeon: Jerline Pain, MD;  Location: [facility] ENDOSCOPY;  Service: Cardiovascular;  Laterality: N/A;  . CHOLECYSTECTOMY    . OTHER SURGICAL HISTORY     "Shot a couple of times"  . TEE WITHOUT CARDIOVERSION N/A 06/20/2020   Procedure: TRANSESOPHAGEAL ECHOCARDIOGRAM (TEE);  Surgeon: Jerline Pain, MD;  Location: [facility] ENDOSCOPY;  Service: Cardiovascular;  Laterality: N/A;     Inpatient Medications: Scheduled Meds: . apixaban  5 mg Oral BID  . furosemide  40 mg Intravenous BID  . metoprolol succinate  100 mg Oral Daily  . [START ON 08/15/2020] pneumococcal 23 valent vaccine  0.5 mL Intramuscular Tomorrow-1000  . potassium chloride  40 mEq Oral BID  . ramelteon  8 mg Oral QHS  . sodium chloride flush  3 mL Intravenous Q12H   Continuous Infusions:  PRN Meds: acetaminophen **OR** acetaminophen, white petrolatum  Allergies:    Allergies  Allergen Reactions  . Codeine Nausea And Vomiting  . Lisinopril Other (See Comments)    "Didn't do well in my body"  . Shellfish Allergy Other (See Comments)    Can tolerate shrimp in 2021 (??)    Social History:   Social History   Socioeconomic History  . Marital status: Single    Spouse name: Not on file  . Number of children: Not on file  . Years of education: Not on file  . Highest education level: Not on file  Occupational History  . Not on file  Tobacco Use  . Smoking status: Former  Smoker    Packs/day: 0.25    Types: Cigarettes    Quit date: 05/14/2020    Years since quitting: 0.2  . Smokeless tobacco: Never Used  Vaping Use  . Vaping Use: Never used  Substance and Sexual Activity  . Alcohol use: Yes    Alcohol/week: 0.0 standard drinks    Comment: occasionally; once every 2 weeks  . Drug use: No  . Sexual activity: Not on file  Other Topics Concern  . Not on file  Social History Narrative  . Not on file   Social Determinants of Health   Financial Resource Strain:    . Difficulty of Paying Living Expenses: Not on file  Food Insecurity:   . Worried About Charity fundraiser in the Last Year: Not on file  . Ran Out of Food in the Last Year: Not on file  Transportation Needs:   . Lack of Transportation (Medical): Not on file  . Lack of Transportation (Non-Medical): Not on file  Physical Activity:   . Days of Exercise per Week: Not on file  . Minutes of Exercise per Session: Not on file  Stress:   . Feeling of Stress : Not on file  Social Connections:   . Frequency of Communication with Friends and Family: Not on file  . Frequency of Social Gatherings with Friends and Family: Not on file  . Attends Religious Services: Not on file  . Active Member of Clubs or Organizations: Not on file  . Attends Archivist Meetings: Not on file  . Marital Status: Not on file  Intimate Partner Violence:   . Fear of Current or Ex-Partner: Not on file  . Emotionally Abused: Not on file  . Physically Abused: Not on file  . Sexually Abused: Not on file    Family History:   Family History  Problem Relation Age of Onset  . Colon cancer Neg Hx      ROS:  Please see the history of present illness.  All other ROS reviewed and negative.     Physical Exam/Data:   Vitals:   08/14/20 1000 08/14/20 1030 08/14/20 1051 08/14/20 1106  BP: (!) 119/96 (!) 103/59  109/79  Pulse: 72     Resp: (!) 27   (!) 22  Temp:   (!) 97.5 F (36.4 C) 97.9 F (36.6 C)  TempSrc:   Oral Oral  SpO2: 91% 98%  97%  Height:    5\' 6"  (1.676 m)    Intake/Output Summary (Last 24 hours) at 08/14/2020 1331 Last data filed at 08/14/2020 1200 Gross per 24 hour  Intake 240 ml  Output 2150 ml  Net -1910 ml   Last 3 Weights 08/05/2020 08/04/2020 08/04/2020  Weight (lbs) 204 lb 4.8 oz 210 lb 3.2 oz 210 lb 1.6 oz  Weight (kg) 92.67 kg 95.346 kg 95.3 kg     Body mass index is 32.97 kg/m.  General:  Well nourished, well developed, in no acute distress HEENT: normal Lymph: no  adenopathy Neck:  JVD difficult to  Endocrine:  No thryomegaly Vascular: No carotid bruits; FA pulses 2+ bilaterally without bruits  Cardiac:  normal S1, S2; RRR; no murmur  Lungs:  clear to auscultation bilaterally, no wheezing, rhonchi or rales  Abd: soft, nontender, no hepatomegaly  Ext: no edema Musculoskeletal:  No deformities, BUE and BLE strength normal and equal Skin: warm and dry  Neuro:  CNs 2-12 intact, no focal abnormalities noted Psych:  Normal affect  EKG:  The EKG was personally reviewed and demonstrates:  Atrial fibrillation at 100 bpm Telemetry:  Telemetry was personally reviewed and demonstrates:  afib 90-100s  Relevant CV Studies:  Echo 06/19/2020 1. Left ventricular ejection fraction, by estimation, is 35 to 40%. The  left ventricle has moderately decreased function. The left ventricle  demonstrates global hypokinesis. The left ventricular internal cavity size  was moderately dilated. Left  ventricular diastolic function could not be evaluated. There is severe  hypokinesis of the left ventricular, entire anteroseptal wall and anterior  wall.  2. Right ventricular systolic function is low normal. The right  ventricular size is normal. Tricuspid regurgitation signal is inadequate  for assessing PA pressure.  3. Left atrial size was severely dilated.  4. Right atrial size was mildly dilated.  5. Restricted posterior leaflet with heavy calcification of annular and  posteromedial papillary muscles. The mitral valve is abnormal. Mild mitral  valve regurgitation. Moderate mitral annular calcification.  6. The aortic valve is calcified. There is moderate calcification of the  aortic valve. There is moderate thickening of the aortic valve. Aortic  valve regurgitation is not visualized.  7. The inferior vena cava is dilated in size with >50% respiratory  variability, suggesting right atrial pressure of 8 mmHg.   Laboratory Data:  High Sensitivity Troponin:     Recent Labs  Lab 08/03/20 1440 08/03/20 1655 08/13/20 1643  TROPONINIHS 21* 27* 23*     Chemistry Recent Labs  Lab 08/13/20 1634 08/14/20 0349  NA 141 140  K 3.5 3.1*  CL 109 107  CO2 22 21*  GLUCOSE 97 95  BUN 19 21  CREATININE 1.40* 1.29*  CALCIUM 9.1 8.6*  GFRNONAA 56* >60  ANIONGAP 10 12    Hematology Recent Labs  Lab 08/13/20 1634 08/14/20 0349  WBC 6.3 5.0  RBC 5.84* 5.36  HGB 16.3 15.0  HCT 50.9 46.3  MCV 87.2 86.4  MCH 27.9 28.0  MCHC 32.0 32.4  RDW 14.8 14.6  PLT 288 277   BNP Recent Labs  Lab 08/13/20 1643  BNP 920.3*     Radiology/Studies:  DG Chest 2 View  Result Date: 08/13/2020 CLINICAL DATA:  Shortness of breath EXAM: CHEST - 2 VIEW COMPARISON:  August 13, 2020 FINDINGS: The cardiomediastinal silhouette is unchanged and enlarged in contour. Small RIGHT pleural effusion. No pneumothorax. Diffuse interstitial prominence and peribronchial cuffing, mildly increased in conspicuity in comparison to prior. RIGHT basilar heterogeneous opacity, likely atelectasis. Prominent azygos shadow. Visualized abdomen is unremarkable. Multilevel degenerative changes of the thoracic spine. IMPRESSION: Constellation of findings are favored to reflect pulmonary edema with a small RIGHT pleural effusion and scattered atelectasis. Differential considerations include atypical infection. Electronically Signed   By: Valentino Saxon MD   On: 08/13/2020 17:08   DG Chest 2 View  Result Date: 08/13/2020 CLINICAL DATA:  Shortness of breath. EXAM: CHEST - 2 VIEW COMPARISON:  August 03, 2020. FINDINGS: Stable cardiomediastinal silhouette. No pneumothorax is noted. Left lung is clear. Minimal right pleural effusion is noted. Minimal right basilar atelectasis or infiltrate is noted. The visualized skeletal structures are unremarkable. IMPRESSION: Minimal right basilar atelectasis or infiltrate is noted with minimal right pleural effusion. Electronically Signed   By: Marijo Conception M.D.   On: 08/13/2020 13:13     Assessment and Plan:   1. Acute on chronic combined CHF -Suspects acute exacerbation due to recurrent tachycardia.  Patient reports compliance with fluid pills and low-sodium diet.  His breathing has  improved since got IV Lasix here.  Net I&O -1.9 L. -Continue Toprol-XL 100 mg -History of significant angioedema on ACE leading to respiratory distress in 2020 however he has tolerated Entresto short-term 06/2020.  He cannot afford Entresto.  Will review with MD.  2.  Persistent atrial fibrillation -Rate relatively stable at 90-110s.  Suspects intermittent exacerbation of heart rate.  Patient will benefit from maintaining sinus rhythm. -Continue Eliquis for anticoagulation.  3.  Hypertension -Blood pressure relatively stable on beta-blocker  Plan: Plan Left and right cath tomorrow afternoon. Stop Eliquis. Close to Euvolemic. Got AM dose of lasix>> switch to IV lasix daily.   Start heparin and amiodarone 400mg  BID x 1 week then 200mg  BID x 1 week and then 200mg  daily. Plan to start ARB and spironolactone post cath. No Entresto due to cost and prior hx of angioedema with respiratory failure on ACE. TEE cardioversion on Monday with Dr. Harrington Challenger at [facility] ([facility]) Functional Class NYHA Class II  Then REFRESH your note.       :[id number]}  CHA2DS2-VASc Score = 3  This indicates a 3.2% annual risk of stroke. The patient's score is based upon: CHF History: 1 HTN History: 1 Diabetes History: 0 Stroke History: 0 Vascular Disease History: 0 Age Score: 1 Gender Score: 0   For questions or updates, please contact [facility] Please consult [email or web address] for contact info under    Jarrett Soho, PA  08/14/2020 1:31 PM   Patient seen and examined.  Agree with above documentation.  Christopher Wilkins is a 64 year old male with a history of chronic combined systolic and diastolic heart failure, hypertension, obesity, tobacco use,  persistent atrial fibrillation who is being seen today for evaluation of heart failure at the request of Dr. Eliseo Squires.  Patient was initially admitted in September 2021 with new onset heart failure and atrial fibrillation.  Echocardiogram showed LVEF 35 to 40%.  He underwent TEE/DCCV at that time with restoration of sinus rhythm.  He was discharged on Eliquis, Entresto, Toprol, and Lasix.  At follow-up clinic visit on 9/22, he has not started [facility] as it was too expensive.  He remained in sinus rhythm at that time.  He was admitted again in October 2021 with acute heart failure and again found to be in A. fib with RVR.  He was diuresed and his Toprol dose was increased.  He subsequently presented back to the ED on 11/3 with worsening shortness of breath.  Vital signs notable for BP 136/108, SPO2 98% on room air, pulse 111.  Labs notable for creatinine 1.4, potassium 3.5, sodium 141, WBC 6.3, hemoglobin 16.3, platelets 288, BNP 920, high-sensitivity troponin 23.  EKG shows atrial fibrillation, rate 100, LVH, PVCs.  Telemetry shows atrial fibrillation with rate 90s to 100s.  He was started on diuresis with IV Lasix 40 mg daily, net -1.9 L so far.  Reports improvement in dyspnea.  On exam, patient is alert and oriented, irregular rhythm, normal rate, no murmurs, lungs CTAB, trace lower extremity edema.  For his heart failure, most recent echocardiogram in September showed EF 35 to 40%.  Recommend ruling out obstructive CAD with LHC/RHC tomorrow.  Will hold Eliquis and start heparin drip.  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  Risks and benefits of cardiac catheterization have been discussed with the patient.  These include bleeding, infection, kidney damage, stroke, heart attack, death.  The patient understands these risks and is willing  to proceed.  For his atrial fibrillation, he underwent cardioversion in September but now is back in atrial  fibrillation.  Suspect he will not be able to maintain sinus rhythm without antiarrhythmic medication given his severely dilated atria.  Will start amiodarone load with 400 mg twice daily x1 week, then 200 mg twice daily x1 week, then 200 mg daily.  Will plan TEE/DCCV on Monday.  Donato Heinz, MD

## 2020-08-14 NOTE — ED Notes (Signed)
Pt transferred to hospital bed for comfort.

## 2020-08-14 NOTE — ED Notes (Signed)
Lunch Tray Ordered @ 1016. 

## 2020-08-14 NOTE — ED Notes (Signed)
Attempted report 

## 2020-08-14 NOTE — Progress Notes (Signed)
Called Lorriane Shire, patient's sister, to update her on her brother but she did not answer and there was not a voicemail set up to leave a message.

## 2020-08-14 NOTE — Progress Notes (Signed)
ANTICOAGULATION CONSULT NOTE - Initial Consult  Pharmacy Consult for heparin Indication: atrial fibrillation  Allergies  Allergen Reactions  . Codeine Nausea And Vomiting  . Lisinopril Other (See Comments)    "Didn't do well in my body"  . Shellfish Allergy Other (See Comments)    Can tolerate shrimp in 2021 (??)    Patient Measurements: Height: 5\' 6"  (167.6 cm) IBW/kg (Calculated) : 63.8 Heparin Dosing Weight: 83kg  Vital Signs: Temp: 97.9 F (36.6 C) (11/04 1106) Temp Source: Oral (11/04 1106) BP: 109/79 (11/04 1106) Pulse Rate: 72 (11/04 1000)  Labs: Recent Labs    08/13/20 1634 08/13/20 1643 08/14/20 0349  HGB 16.3  --  15.0  HCT 50.9  --  46.3  PLT 288  --  277  CREATININE 1.40*  --  1.29*  TROPONINIHS  --  23*  --     Estimated Creatinine Clearance: 60.9 mL/min (A) (by C-G formula based on SCr of 1.29 mg/dL (H)).   Medical History: Past Medical History:  Diagnosis Date  . Atrial fibrillation ([place])   . CHF (congestive heart failure) ([place])   . Dyspnea   . Hypertension   . Insomnia     Assessment: 64 year old with history of afib on apixaban prior to admit. New orders to change to IV heparin with plans for [facility] tomorrow. Apixaban given this morning, will start IV heparin tonight. Will need to monitor aptts until apixaban is cleared from system altering anti-xa levels.   Goal of Therapy:  Heparin level 0.3-0.7 units/ml aPTT 66-102 seconds Monitor platelets by anticoagulation protocol: Yes   Plan:  Start heparin infusion at 1200  units/hr Check anti-Xa level in 6 hours and daily while on heparin Continue to monitor H&H and platelets  Erin Hearing PharmD., BCPS Clinical Pharmacist 08/14/2020 2:21 PM

## 2020-08-14 NOTE — Progress Notes (Signed)
Progress Note    Christopher Wilkins  TMA:[id number] DOB: May 04, 1956  DOA: 08/13/2020 PCP: Rogers Blocker, MD    Brief Narrative:     Medical records reviewed and are as summarized below:  Christopher Wilkins is an 64 y.o. male with medical history significant of atrial fibrillation, hypertension, CHF (EF 30-35% on 06/20/2020) who presents with worsening dyspnea and lower extremity edema. Patient recently admitted from 10/24-10/26 for heart failure exacerbation believed to be secondary to atrial fibrillation which was not rate controlled. He is metoprolol was increased to 100 mg daily at that time with improvement of his rate. He states that since discharge he has had progressively worsening dyspnea, edema, DOE, and orthopnea. He reports about a 7 pound weight gain this time. He states he has been taking his medications that were prescribed on discharge including taking Lasix daily. He reports some cough worse with lying down.  Assessment/Plan:   Principal Problem:   Acute exacerbation of CHF (congestive heart failure) (HCC) Active Problems:   Essential hypertension   A-fib (HCC)   Acute on chronic systolic CHF (25-63%) -Recent admission for the same thought to be due to poorly controlled A. fib, discharged 10/26 - Progressively worsening symptoms since discharge - Will not repeat echocardiogram given recent echo in September (30-35%) - Received a dose of IV Lasix in ED, continue IV Lasix 40 mg twice daily - Daily weights (not done), strict I/O - daily potassium -down 1.4L if I/Os correct -cardiology consult: was on entresto prior but taken off, also taken off lasix at outpatient appointment  Atrial fibrillation -Metoprolol succinate recently increased to 100 mg daily during recent admission - will likely need further titration of beta-blocker or other rate control medication but BP on lower end - Continue metoprolol and Eliquis  Hypokalemia/hypomagnesemia -replete IV and PO  Family  Communication/Anticipated D/C date and plan/Code Status   DVT prophylaxis: eliquis Code Status: Full Code.  Family Communication:  Disposition Plan: Status is: Inpatient  Remains inpatient appropriate because:IV treatments appropriate due to intensity of illness or inability to take PO   Dispo: The patient is from: Home              Anticipated d/c is to: Home              Anticipated d/c date is: 2 days              Patient currently is not medically stable to d/c.         Medical Consultants:    cards.     Subjective:   In bed, no [place], says he has been compliant with diet   Objective:    Vitals:   08/14/20 0645 08/14/20 0716 08/14/20 0815 08/14/20 1000  BP: (!) 125/97 106/77 106/88 (!) 119/96  Pulse: 73 92 (!) 51 72  Resp: (!) 0 19 20 (!) 27  Temp:      TempSrc:      SpO2: (!) 88% 96% 93% 91%    Intake/Output Summary (Last 24 hours) at 08/14/2020 1019 Last data filed at 08/14/2020 0852 Gross per 24 hour  Intake --  Output 1450 ml  Net -1450 ml   There were no vitals filed for this visit.  Exam:  General: Appearance:    Obese male in no acute distress     Lungs:     Not on NCrespirations unlabored  Heart:    Normal heart rate. irr. No murmurs, rubs, or gallops.  MS:   All extremities are intact.   Neurologic:   Awake, alert, oriented x 3. No apparent focal neurological           defect.     Data Reviewed:   I have personally reviewed following labs and imaging studies:  Labs: Labs show the following:   Basic Metabolic Panel: Recent Labs  Lab 08/13/20 1634 08/14/20 0349  NA 141 140  K 3.5 3.1*  CL 109 107  CO2 22 21*  GLUCOSE 97 95  BUN 19 21  CREATININE 1.40* 1.29*  CALCIUM 9.1 8.6*  MG  --  1.8   GFR Estimated Creatinine Clearance: 60.9 mL/min (A) (by C-G formula based on SCr of 1.29 mg/dL (H)). Liver Function Tests: No results for input(s): AST, ALT, ALKPHOS, BILITOT, PROT, ALBUMIN in the last 168 hours. No results for  input(s): LIPASE, AMYLASE in the last 168 hours. No results for input(s): AMMONIA in the last 168 hours. Coagulation profile No results for input(s): INR, PROTIME in the last 168 hours.  CBC: Recent Labs  Lab 08/13/20 1634 08/14/20 0349  WBC 6.3 5.0  HGB 16.3 15.0  HCT 50.9 46.3  MCV 87.2 86.4  PLT 288 277   Cardiac Enzymes: No results for input(s): CKTOTAL, CKMB, CKMBINDEX, TROPONINI in the last 168 hours. BNP (last 3 results) No results for input(s): PROBNP in the last 8760 hours. CBG: No results for input(s): GLUCAP in the last 168 hours. D-Dimer: No results for input(s): DDIMER in the last 72 hours. Hgb A1c: No results for input(s): HGBA1C in the last 72 hours. Lipid Profile: No results for input(s): CHOL, HDL, LDLCALC, TRIG, CHOLHDL, LDLDIRECT in the last 72 hours. Thyroid function studies: No results for input(s): TSH, T4TOTAL, T3FREE, THYROIDAB in the last 72 hours.  Invalid input(s): FREET3 Anemia work up: No results for input(s): VITAMINB12, FOLATE, FERRITIN, TIBC, IRON, RETICCTPCT in the last 72 hours. Sepsis Labs: Recent Labs  Lab 08/13/20 1634 08/14/20 0349  WBC 6.3 5.0    Microbiology Recent Results (from the past 240 hour(s))  Respiratory Panel by RT PCR (Flu A&B, Covid) - Nasopharyngeal Swab     Status: None   Collection Time: 08/13/20  6:36 PM   Specimen: Nasopharyngeal Swab  Result Value Ref Range Status   SARS Coronavirus 2 by RT PCR NEGATIVE NEGATIVE Final    Comment: (NOTE) SARS-CoV-2 target nucleic acids are NOT DETECTED.  The SARS-CoV-2 RNA is generally detectable in upper respiratoy specimens during the acute phase of infection. The lowest concentration of SARS-CoV-2 viral copies this assay can detect is 131 copies/mL. A negative result does not preclude SARS-Cov-2 infection and should not be used as the sole basis for treatment or other patient management decisions. A negative result may occur with  improper specimen  collection/handling, submission of specimen other than nasopharyngeal swab, presence of viral mutation(s) within the areas targeted by this assay, and inadequate number of viral copies (<131 copies/mL). A negative result must be combined with clinical observations, patient history, and epidemiological information. The expected result is Negative.  Fact Sheet for Patients:  PinkCheek.be  Fact Sheet for Healthcare Providers:  GravelBags.it  This test is no t yet approved or cleared by the [place] FDA and  has been authorized for detection and/or diagnosis of SARS-CoV-2 by FDA under an Emergency Use Authorization (EUA). This EUA will remain  in effect (meaning this test can be used) for the duration of the COVID-19 declaration under Section 564(b)(1) of the Act, 21  U.S.C. section 360bbb-3(b)(1), unless the authorization is terminated or revoked sooner.     Influenza A by PCR NEGATIVE NEGATIVE Final   Influenza B by PCR NEGATIVE NEGATIVE Final    Comment: (NOTE) The Xpert Xpress SARS-CoV-2/FLU/RSV assay is intended as an aid in  the diagnosis of influenza from Nasopharyngeal swab specimens and  should not be used as a sole basis for treatment. Nasal washings and  aspirates are unacceptable for Xpert Xpress SARS-CoV-2/FLU/RSV  testing.  Fact Sheet for Patients: PinkCheek.be  Fact Sheet for Healthcare Providers: GravelBags.it  This test is not yet approved or cleared by the [place] FDA and  has been authorized for detection and/or diagnosis of SARS-CoV-2 by  FDA under an Emergency Use Authorization (EUA). This EUA will remain  in effect (meaning this test can be used) for the duration of the  Covid-19 declaration under Section 564(b)(1) of the Act, 21  U.S.C. section 360bbb-3(b)(1), unless the authorization is  terminated or revoked. Performed at [facility], [place] [address] [place], [place] [address]     Procedures and diagnostic studies:  DG Chest 2 View  Result Date: 08/13/2020 CLINICAL DATA:  Shortness of breath EXAM: CHEST - 2 VIEW COMPARISON:  August 13, 2020 FINDINGS: The cardiomediastinal silhouette is unchanged and enlarged in contour. Small RIGHT pleural effusion. No pneumothorax. Diffuse interstitial prominence and peribronchial cuffing, mildly increased in conspicuity in comparison to prior. RIGHT basilar heterogeneous opacity, likely atelectasis. Prominent azygos shadow. Visualized abdomen is unremarkable. Multilevel degenerative changes of the thoracic spine. IMPRESSION: Constellation of findings are favored to reflect pulmonary edema with a small RIGHT pleural effusion and scattered atelectasis. Differential considerations include atypical infection. Electronically Signed   By: Valentino Saxon MD   On: 08/13/2020 17:08   DG Chest 2 View  Result Date: 08/13/2020 CLINICAL DATA:  Shortness of breath. EXAM: CHEST - 2 VIEW COMPARISON:  August 03, 2020. FINDINGS: Stable cardiomediastinal silhouette. No pneumothorax is noted. Left lung is clear. Minimal right pleural effusion is noted. Minimal right basilar atelectasis or infiltrate is noted. The visualized skeletal structures are unremarkable. IMPRESSION: Minimal right basilar atelectasis or infiltrate is noted with minimal right pleural effusion. Electronically Signed   By: Marijo Conception M.D.   On: 08/13/2020 13:13    Medications:   . apixaban  5 mg Oral BID  . furosemide  40 mg Intravenous BID  . metoprolol succinate  100 mg Oral Daily  . ramelteon  8 mg Oral QHS  . sodium chloride flush  3 mL Intravenous Q12H   Continuous Infusions:   LOS: 1 day   [place] [facility]   How to contact the [facility] Attending or Consulting provider Bryan or covering provider during after hours [place], for this patient?  1. Check the care team in [facility] and look for  a) attending/consulting TR[facility] provider listed and b) the [facility] team listed 2. Log into [email or web address] and use [place] universal password to access. If you do not have the password, please contact the hospital operator. 3. Locate the [facility] provider you are looking for under [facility] and page to a number that you can be directly reached. 4. If you still have difficulty reaching the provider, please page the [facility] (Director on Call) for the Hospitalists listed on amion for assistance.  08/14/2020, 10:19 AM

## 2020-08-15 ENCOUNTER — Inpatient Hospital Stay: Payer: Self-pay

## 2020-08-15 ENCOUNTER — Encounter (HOSPITAL_COMMUNITY)
Admission: EM | Disposition: A | Discharge status: Home Health | Payer: Self-pay | Source: Home / Self Care | Attending: Internal Medicine

## 2020-08-15 ENCOUNTER — Inpatient Hospital Stay (HOSPITAL_COMMUNITY): Payer: Medicare HMO

## 2020-08-15 DIAGNOSIS — I10 Essential (primary) hypertension: Secondary | ICD-10-CM | POA: Diagnosis not present

## 2020-08-15 DIAGNOSIS — I5043 Acute on chronic combined systolic (congestive) and diastolic (congestive) heart failure: Secondary | ICD-10-CM | POA: Diagnosis not present

## 2020-08-15 DIAGNOSIS — I4891 Unspecified atrial fibrillation: Secondary | ICD-10-CM | POA: Diagnosis not present

## 2020-08-15 DIAGNOSIS — I4819 Other persistent atrial fibrillation: Secondary | ICD-10-CM | POA: Diagnosis not present

## 2020-08-15 LAB — BASIC METABOLIC PANEL
Anion gap: 9 (ref 5–15)
Anion gap: 13 (ref 5–15)
BUN: 22 mg/dL (ref 8–23)
BUN: 23 mg/dL (ref 8–23)
CO2: 24 mmol/L (ref 22–32)
CO2: 20 mmol/L — ABNORMAL LOW (ref 22–32)
Calcium: 8.8 mg/dL — ABNORMAL LOW (ref 8.9–10.3)
Calcium: 8.8 mg/dL — ABNORMAL LOW (ref 8.9–10.3)
Chloride: 109 mmol/L (ref 98–111)
Chloride: 108 mmol/L (ref 98–111)
Creatinine, Ser: 1.66 mg/dL — ABNORMAL HIGH (ref 0.61–1.24)
Creatinine, Ser: 1.74 mg/dL — ABNORMAL HIGH (ref 0.61–1.24)
GFR, Estimated: 45 mL/min — ABNORMAL LOW (ref >60)
GFR, Estimated: 43 mL/min — ABNORMAL LOW (ref >60)
Glucose, Bld: 112 mg/dL — ABNORMAL HIGH (ref 70–99)
Glucose, Bld: 130 mg/dL — ABNORMAL HIGH (ref 70–99)
Potassium: 4.3 mmol/L (ref 3.5–5.1)
Potassium: 4.3 mmol/L (ref 3.5–5.1)
Sodium: 142 mmol/L (ref 135–145)
Sodium: 141 mmol/L (ref 135–145)

## 2020-08-15 LAB — CBC
HCT: 47.6 % (ref 39.0–52.0)
Hemoglobin: 15.3 g/dL (ref 13.0–17.0)
MCH: 27.6 pg (ref 26.0–34.0)
MCHC: 32.1 g/dL (ref 30.0–36.0)
MCV: 85.8 fL (ref 80.0–100.0)
Platelets: 255 10*3/uL (ref 150–400)
RBC: 5.55 MIL/uL (ref 4.22–5.81)
RDW: 14.6 % (ref 11.5–15.5)
WBC: 6.1 10*3/uL (ref 4.0–10.5)
nRBC: 0 % (ref 0.0–0.2)

## 2020-08-15 LAB — COOXEMETRY PANEL
Carboxyhemoglobin: 0.7 % (ref 0.5–1.5)
Methemoglobin: 0.8 % (ref 0.0–1.5)
O2 Saturation: 44.4 %
Total hemoglobin: 15.4 g/dL (ref 12.0–16.0)

## 2020-08-15 LAB — LACTIC ACID, PLASMA
Lactic Acid, Venous: 2.5 mmol/L (ref 0.5–1.9)
Lactic Acid, Venous: 2.2 mmol/L (ref 0.5–1.9)

## 2020-08-15 LAB — ECHOCARDIOGRAM LIMITED
Calc EF: 20.6 %
Height: 66 in
Single Plane A2C EF: 18.4 %
Single Plane A4C EF: 24.5 %
Weight: 3273.6 oz

## 2020-08-15 LAB — MAGNESIUM: Magnesium: 2.2 mg/dL (ref 1.7–2.4)

## 2020-08-15 LAB — HEPARIN LEVEL (UNFRACTIONATED): Heparin Unfractionated: 0.77 IU/mL — ABNORMAL HIGH (ref 0.30–0.70)

## 2020-08-15 LAB — APTT
aPTT: 39 seconds — ABNORMAL HIGH (ref 24–36)
aPTT: 73 seconds — ABNORMAL HIGH (ref 24–36)

## 2020-08-15 SURGERY — RIGHT/LEFT HEART CATH AND CORONARY ANGIOGRAPHY
Anesthesia: LOCAL

## 2020-08-15 MED ORDER — SODIUM CHLORIDE 0.9% FLUSH: 10.0000 mL | INTRAVENOUS | Status: DC | PRN

## 2020-08-15 MED ORDER — SODIUM CHLORIDE 0.9% FLUSH
10.0000 mL | Freq: Two times a day (BID) | INTRAVENOUS | Status: DC
  Administered 2020-08-15 – 2020-08-16 (×2): 30 mL
  Administered 2020-08-16 – 2020-08-18 (×3): 10 mL
  Administered 2020-08-19: 40 mL
  Administered 2020-08-19 – 2020-08-21 (×3): 10 mL

## 2020-08-15 MED ORDER — MILRINONE LACTATE IN DEXTROSE 20-5 MG/100ML-% IV SOLN
0.1250 ug/kg/min | INTRAVENOUS | Status: DC
  Administered 2020-08-16 – 2020-08-18 (×3): 0.125 ug/kg/min via INTRAVENOUS
  Filled 2020-08-15 (×3): qty 100

## 2020-08-15 MED ORDER — CHLORHEXIDINE GLUCONATE CLOTH 2 % EX PADS
6.0000 | MEDICATED_PAD | Freq: Every day | CUTANEOUS | Status: DC
  Administered 2020-08-16 – 2020-08-21 (×5): 6 via TOPICAL

## 2020-08-15 MED ORDER — FUROSEMIDE 10 MG/ML IJ SOLN
40.0000 mg | Freq: Two times a day (BID) | INTRAMUSCULAR | Status: DC
  Administered 2020-08-16: 40 mg via INTRAVENOUS
  Filled 2020-08-15 (×2): qty 4

## 2020-08-15 MED ORDER — AMIODARONE HCL IN DEXTROSE 360-4.14 MG/200ML-% IV SOLN
60.0000 mg/h | INTRAVENOUS | Status: AC
  Administered 2020-08-16: 60 mg/h via INTRAVENOUS
  Filled 2020-08-15: qty 200

## 2020-08-15 MED ORDER — AMIODARONE HCL IN DEXTROSE 360-4.14 MG/200ML-% IV SOLN
30.0000 mg/h | INTRAVENOUS | Status: DC
  Administered 2020-08-16 – 2020-08-20 (×10): 30 mg/h via INTRAVENOUS
  Filled 2020-08-15 (×11): qty 200

## 2020-08-15 MED ORDER — AMIODARONE IV BOLUS ONLY 150 MG/100ML
150.0000 mg | Freq: Once | INTRAVENOUS | Status: DC
  Filled 2020-08-15: qty 100

## 2020-08-15 NOTE — Progress Notes (Signed)
  Amiodarone Drug - Drug Interaction Consult Note  Recommendations: None, monitor for now.  Amiodarone is metabolized by the cytochrome P450 system and therefore has the potential to cause many drug interactions. Amiodarone has an average plasma half-life of 50 days (range 20 to 100 days).   There is potential for drug interactions to occur several weeks or months after stopping treatment and the onset of drug interactions may be slow after initiating amiodarone.   []  Statins: Increased risk of myopathy. Simvastatin- restrict dose to 20mg  daily. Other statins: counsel patients to report any muscle pain or weakness immediately.  []  Anticoagulants: Amiodarone can increase anticoagulant effect. Consider warfarin dose reduction. Patients should be monitored closely and the dose of anticoagulant altered accordingly, remembering that amiodarone levels take several weeks to stabilize.  []  Antiepileptics: Amiodarone can increase plasma concentration of phenytoin, the dose should be reduced. Note that small changes in phenytoin dose can result in large changes in levels. Monitor patient and counsel on signs of toxicity.  []  Beta blockers: increased risk of bradycardia, AV block and myocardial depression. Sotalol - avoid concomitant use.  []   Calcium channel blockers (diltiazem and verapamil): increased risk of bradycardia, AV block and myocardial depression.  []   Cyclosporine: Amiodarone increases levels of cyclosporine. Reduced dose of cyclosporine is recommended.  []  Digoxin dose should be halved when amiodarone is started.  []  Diuretics: increased risk of cardiotoxicity if hypokalemia occurs.  []  Oral hypoglycemic agents (glyburide, glipizide, glimepiride): increased risk of hypoglycemia. Patient's glucose levels should be monitored closely when initiating amiodarone therapy.   []  Drugs that prolong the QT interval:  Torsades de pointes risk may be increased with concurrent use - avoid if  possible.  Monitor QTc, also keep magnesium/potassium WNL if concurrent therapy can't be avoided. Marland Kitchen Antibiotics: e.g. fluoroquinolones, erythromycin. . Antiarrhythmics: e.g. quinidine, procainamide, disopyramide, sotalol. . Antipsychotics: e.g. phenothiazines, haloperidol.  . Lithium, tricyclic antidepressants, and methadone. Thank You,  Pat Patrick  08/15/2020 5:57 PM

## 2020-08-15 NOTE — Progress Notes (Signed)
Echocardiogram 2D Echocardiogram has been performed.  Oneal Deputy Arliss Hepburn 08/15/2020, 3:15 PM

## 2020-08-15 NOTE — Progress Notes (Signed)
[facility] for heparin Indication: atrial fibrillation  Allergies  Allergen Reactions  . Codeine Nausea And Vomiting  . Lisinopril Other (See Comments)    "Didn't do well in my body"  . Shellfish Allergy Other (See Comments)    Can tolerate shrimp in 2021 (??)    Patient Measurements: Height: 5\' 6"  (167.6 cm) Weight: 92.8 kg (204 lb 9.6 oz) IBW/kg (Calculated) : 63.8 Heparin Dosing Weight: 83kg  Vital Signs: Temp: 97.6 F (36.4 C) (11/05 1621) Temp Source: Oral (11/05 1621) BP: 113/87 (11/05 1621) Pulse Rate: 96 (11/05 1621)  Labs: Recent Labs    08/13/20 1634 08/13/20 1634 08/13/20 1643 08/14/20 0349 08/15/20 0352 08/15/20 1322 08/15/20 1620  HGB 16.3   < >  --  15.0 15.3  --   --   HCT 50.9  --   --  46.3 47.6  --   --   PLT 288  --   --  277 255  --   --   APTT  --   --   --   --  39*  --  73*  HEPARINUNFRC  --   --   --   --  0.77*  --   --   CREATININE 1.40*   < >  --  1.29* 1.66* 1.74*  --   TROPONINIHS  --   --  23*  --   --   --   --    < > = values in this interval not displayed.    Estimated Creatinine Clearance: 45.1 mL/min (A) (by C-G formula based on SCr of 1.74 mg/dL (H)).   Assessment: 64 y.o. male with h/o Afib, Eliquis on hold, for heparin.   APTT came back therapeutic at 73, on 1500 units/hr. Hgb 15.3, plt 255. No s/sx of bleeding or infusion issues.   Goal of Therapy:  Heparin level 0.3-0.7 units/ml aPTT 66-102 seconds Monitor platelets by anticoagulation protocol: Yes   Plan:  Continue heparin infusion at 1500 units/hr Monitor daily HL/aPTT, CBC, and s/sx of bleeding    Antonietta Jewel, PharmD, [place] Clinical Pharmacist  Phone: [phone] 08/15/2020 5:13 PM  Please check AMION for all [place] phone numbers After 10:00 PM, call [place] [phone]

## 2020-08-15 NOTE — Progress Notes (Signed)
[facility] called for Critical Lactic Acid at 2.5. Rosaria Ferries [facility] personally updated.

## 2020-08-15 NOTE — Progress Notes (Signed)
[place] for heparin Indication: atrial fibrillation  Allergies  Allergen Reactions  . Codeine Nausea And Vomiting  . Lisinopril Other (See Comments)    "Didn't do well in my body"  . Shellfish Allergy Other (See Comments)    Can tolerate shrimp in 2021 (??)    Patient Measurements: Height: 5\' 6"  (167.6 cm) Weight: 92.1 kg (203 lb 1.6 oz) IBW/kg (Calculated) : 63.8 Heparin Dosing Weight: 83kg  Vital Signs: Temp: 97.3 F (36.3 C) (11/05 0012) Temp Source: Oral (11/05 0012) BP: 112/84 (11/05 0012) Pulse Rate: 93 (11/05 0012)  Labs: Recent Labs    08/13/20 1634 08/13/20 1634 08/13/20 1643 08/14/20 0349 08/15/20 0352  HGB 16.3   < >  --  15.0 15.3  HCT 50.9  --   --  46.3 47.6  PLT 288  --   --  277 255  APTT  --   --   --   --  39*  HEPARINUNFRC  --   --   --   --  0.77*  CREATININE 1.40*  --   --  1.29* 1.66*  TROPONINIHS  --   --  23*  --   --    < > = values in this interval not displayed.    Estimated Creatinine Clearance: 47.1 mL/min (A) (by C-G formula based on SCr of 1.66 mg/dL (H)).   Assessment: 64 y.o. male with h/o Afib, Eliquis on hold, for heparin   Goal of Therapy:  Heparin level 0.3-0.7 units/ml aPTT 66-102 seconds Monitor platelets by anticoagulation protocol: Yes   Plan:  Increase Heparin 1500 units/hr Follow up after cath today   Phillis Knack, PharmD, BCPS  08/15/2020 4:57 AM

## 2020-08-15 NOTE — Progress Notes (Signed)
   Repeat BMET is 1.74. Given uptrending Cr will hold off on heart catheterization at this time. Discussed with Dr. Gardiner Rhyme - will plan for a repeat echo to reassess LV function. If improve, may not pursue cath, however if EF remains low, would be reasonable to cath on Monday if Cr is improving. Volume status remains stable. Will hold fluids and diuresis at this time.   Abigail Butts, PA-C 08/15/20; 2:45 PM

## 2020-08-15 NOTE — Progress Notes (Signed)
Consent obtained for PICC.  Rey, RN aware that PICC will be placed this evening.

## 2020-08-15 NOTE — Progress Notes (Signed)
Heart Failure Stewardship Pharmacist Progress Note   PCP: Rogers Blocker, MD PCP-Cardiologist: Donato Heinz, MD    HPI:  64 yo M with PMH of HTN, obesity, CHF, afib, and tobacco use. He was recently admitted from 08/03/20-08/05/20 for CHF. He presented back to the ED on 08/13/20 with worsening dyspnea, 7 lb weight gain, and LE edema. Last ECHO done 06/20/20 and LVEF was 30-35%. Pending R/LHC today.  Current HF Medications: Metoprolol XL 100 mg daily  Prior to admission HF Medications: Furosemide 40 mg daily Metoprolol XL 100 mg daily  Pertinent Lab Values: . Serum creatinine 1.29>1.66, BUN 22, Potassium 4.3, Sodium 142, BNP 920.3, Magnesium 2.2   Vital Signs: . Weight: 204 lbs (admission weight: 203 lbs) . Blood pressure: 90-110/80s  . Heart rate: 100s   Medication Assistance / Insurance Benefits Check: Does the patient have prescription insurance?  Yes Type of insurance plan: [place] [employer]  Outpatient Pharmacy:  Prior to admission outpatient pharmacy: [facility] Is the patient willing to use [facility] at discharge? Yes  Assessment: 1. Acute on chronic systolic CHF (EF 26-71%), likely due to afib RVR, pending ischemic eval with LHC today. NYHA class II symptoms. - Holding IV lasix pending RHC today - Continue metoprolol XL 100 mg daily - No ACE/ARB/ARNI with angioedema possibly from lisinopril use in the past - Consider adding spironolactone once renal function improves - Consider adding Farxiga 10 mg daily prior to discharge (copay likely too high for patient since he was unable to pay for Entresto in the past - patient may be eligible for patient assistance)   Plan: 1) Medication changes recommended at this time: - None pending R/LHC today  2) Patient Assistance: - Farxiga copay $95/month - can help enroll in patient assistance so he can receive this for free from manufacturer  2)  Education  - To be completed prior to  discharge  Kerby Nora, PharmD, BCPS Heart Failure Stewardship Pharmacist Phone [phone]

## 2020-08-15 NOTE — Progress Notes (Signed)
   08/15/20 0520  Assess: MEWS Score  Temp 97.7 F (36.5 C)  BP 113/90  ECG Heart Rate (!) 112  Resp 20  SpO2 97 %  O2 Device Room Air  Assess: MEWS Score  MEWS Temp 0  MEWS Systolic 0  MEWS Pulse 2  MEWS RR 0  MEWS LOC 0  MEWS Score 2  MEWS Score Color Yellow  Assess: if the MEWS score is Yellow or Red  Were vital signs taken at a resting state? Yes  Focused Assessment Change from prior assessment (see assessment flowsheet)  Early Detection of Sepsis Score *See Row Information* Low  MEWS guidelines implemented *See Row Information* Yes  Treat  MEWS Interventions Escalated (See documentation below)  Take Vital Signs  Increase Vital Sign Frequency  Yellow: Q 2hr X 2 then Q 4hr X 2, if remains yellow, continue Q 4hrs  Escalate  MEWS: Escalate Yellow: discuss with charge nurse/RN and consider discussing with provider and RRT  Notify: Charge Nurse/RN  Name of Charge Nurse/RN Notified Shanell, RN  Date Charge Nurse/RN Notified 08/15/20  Time Charge Nurse/RN Notified [phone]  Document  Patient Outcome Other (Comment) (No intervention at this time, will recheck [facility])  Progress note created (see row info) Yes

## 2020-08-15 NOTE — Progress Notes (Addendum)
Progress Note    Christopher Wilkins  QHU:[id number] DOB: 1956/03/15  DOA: 08/13/2020 PCP: Rogers Blocker, MD    Brief Narrative:     Medical records reviewed and are as summarized below:  Christopher Wilkins is an 64 y.o. male with medical history significant of atrial fibrillation, hypertension, CHF (EF 30-35% on 06/20/2020) who presents with worsening dyspnea and lower extremity edema. Patient recently admitted from 10/24-10/26 for heart failure exacerbation believed to be secondary to atrial fibrillation which was not rate controlled. He is metoprolol was increased to 100 mg daily at that time with improvement of his rate. He states that since discharge he has had progressively worsening dyspnea, edema, DOE, and orthopnea. He reports about a 7 pound weight gain this time. He states he has been taking his medications that were prescribed on discharge including taking Lasix daily. He reports some cough worse with lying down.    Assessment/Plan:   Principal Problem:   Acute exacerbation of CHF (congestive heart failure) (HCC) Active Problems:   Essential hypertension   A-fib (HCC)   Acute on chronic systolic CHF (46-56%) -Recent admission for the same thought to be due to poorly controlled A. fib, discharged 10/26 - Progressively worsening symptoms since discharge - Received a dose of IV Lasix in ED, patient has diuresed well, lasix held due to increasing Cr - Daily weights (not done), strict I/O -down 2.2L? -cardiology consult: plan was for heart cath but Cr trending up, instead, now echo will be done and if EF improved, may not need heart cath  Atrial fibrillation -Metoprolol succinate recently increased to 100 mg daily during recent admission - will likely need further titration of beta-blocker or other rate control medication but BP on lower end - Continue metoprolol  -amiodarone added 11/4 -eliquis changed to heparin gtt incase of cath  Hypokalemia/hypomagnesemia -replete IV and  PO  Family Communication/Anticipated D/C date and plan/Code Status   DVT prophylaxis: eliquis held and heparin gtt started for possible cath Code Status: Full Code.  Disposition Plan: Status is: Inpatient  Remains inpatient appropriate because:IV treatments appropriate due to intensity of illness or inability to take PO   Dispo: The patient is from: Home              Anticipated d/c is to: Home              Anticipated d/c date is: 2 days              Patient currently is not medically stable to d/c. ? Need for cath         Medical Consultants:    cards.     Subjective:   This AM c/o being tired  Objective:    Vitals:   08/15/20 0520 08/15/20 0808 08/15/20 1059 08/15/20 1621  BP: 113/90 92/81 108/84 113/87  Pulse:  100 (!) 103 96  Resp: 20 18 18 17   Temp: 97.7 F (36.5 C) 97.8 F (36.6 C) 97.6 F (36.4 C) 97.6 F (36.4 C)  TempSrc: Oral Oral Oral Oral  SpO2: 97% 97% 97% 97%  Weight: 92.8 kg     Height:        Intake/Output Summary (Last 24 hours) at 08/15/2020 1625 Last data filed at 08/15/2020 1622 Gross per 24 hour  Intake 250.48 ml  Output 550 ml  Net -299.52 ml   Filed Weights   08/14/20 1116 08/14/20 1825 08/15/20 0520  Weight: 92.7 kg 92.1 kg 92.8 kg  Exam:  General: Appearance:    Obese male in no acute distress, poor dentition      Lungs:     respirations unlabored  Heart:    Normal heart rate. No murmurs, rubs, or gallops.   MS:   All extremities are intact.   Neurologic:   Awake, alert, oriented x 3. No apparent focal neurological           defect.      Data Reviewed:   I have personally reviewed following labs and imaging studies:  Labs: Labs show the following:   Basic Metabolic Panel: Recent Labs  Lab 08/13/20 1634 08/13/20 1634 08/14/20 0349 08/14/20 0349 08/15/20 0352 08/15/20 1322  NA 141  --  140  --  142 141  K 3.5   < > 3.1*   < > 4.3 4.3  CL 109  --  107  --  109 108  CO2 22  --  21*  --  24 20*    GLUCOSE 97  --  95  --  112* 130*  BUN 19  --  21  --  22 23  CREATININE 1.40*  --  1.29*  --  1.66* 1.74*  CALCIUM 9.1  --  8.6*  --  8.8* 8.8*  MG  --   --  1.8  --  2.2  --    < > = values in this interval not displayed.   GFR Estimated Creatinine Clearance: 45.1 mL/min (A) (by C-G formula based on SCr of 1.74 mg/dL (H)). Liver Function Tests: No results for input(s): AST, ALT, ALKPHOS, BILITOT, PROT, ALBUMIN in the last 168 hours. No results for input(s): LIPASE, AMYLASE in the last 168 hours. No results for input(s): AMMONIA in the last 168 hours. Coagulation profile No results for input(s): INR, PROTIME in the last 168 hours.  CBC: Recent Labs  Lab 08/13/20 1634 08/14/20 0349 08/15/20 0352  WBC 6.3 5.0 6.1  HGB 16.3 15.0 15.3  HCT 50.9 46.3 47.6  MCV 87.2 86.4 85.8  PLT 288 277 255   Cardiac Enzymes: No results for input(s): CKTOTAL, CKMB, CKMBINDEX, TROPONINI in the last 168 hours. BNP (last 3 results) No results for input(s): PROBNP in the last 8760 hours. CBG: No results for input(s): GLUCAP in the last 168 hours. D-Dimer: No results for input(s): DDIMER in the last 72 hours. Hgb A1c: No results for input(s): HGBA1C in the last 72 hours. Lipid Profile: No results for input(s): CHOL, HDL, LDLCALC, TRIG, CHOLHDL, LDLDIRECT in the last 72 hours. Thyroid function studies: No results for input(s): TSH, T4TOTAL, T3FREE, THYROIDAB in the last 72 hours.  Invalid input(s): FREET3 Anemia work up: No results for input(s): VITAMINB12, FOLATE, FERRITIN, TIBC, IRON, RETICCTPCT in the last 72 hours. Sepsis Labs: Recent Labs  Lab 08/13/20 1634 08/14/20 0349 08/15/20 0352  WBC 6.3 5.0 6.1    Microbiology Recent Results (from the past 240 hour(s))  Respiratory Panel by RT PCR (Flu A&B, Covid) - Nasopharyngeal Swab     Status: None   Collection Time: 08/13/20  6:36 PM   Specimen: Nasopharyngeal Swab  Result Value Ref Range Status   SARS Coronavirus 2 by RT PCR  NEGATIVE NEGATIVE Final    Comment: (NOTE) SARS-CoV-2 target nucleic acids are NOT DETECTED.  The SARS-CoV-2 RNA is generally detectable in upper respiratoy specimens during the acute phase of infection. The lowest concentration of SARS-CoV-2 viral copies this assay can detect is 131 copies/mL. A negative result does not preclude  SARS-Cov-2 infection and should not be used as the sole basis for treatment or other patient management decisions. A negative result may occur with  improper specimen collection/handling, submission of specimen other than nasopharyngeal swab, presence of viral mutation(s) within the areas targeted by this assay, and inadequate number of viral copies (<131 copies/mL). A negative result must be combined with clinical observations, patient history, and epidemiological information. The expected result is Negative.  Fact Sheet for Patients:  PinkCheek.be  Fact Sheet for Healthcare Providers:  GravelBags.it  This test is no t yet approved or cleared by the [place] FDA and  has been authorized for detection and/or diagnosis of SARS-CoV-2 by FDA under an Emergency Use Authorization (EUA). This EUA will remain  in effect (meaning this test can be used) for the duration of the COVID-19 declaration under Section 564(b)(1) of the Act, 21 U.S.C. section 360bbb-3(b)(1), unless the authorization is terminated or revoked sooner.     Influenza A by PCR NEGATIVE NEGATIVE Final   Influenza B by PCR NEGATIVE NEGATIVE Final    Comment: (NOTE) The Xpert Xpress SARS-CoV-2/FLU/RSV assay is intended as an aid in  the diagnosis of influenza from Nasopharyngeal swab specimens and  should not be used as a sole basis for treatment. Nasal washings and  aspirates are unacceptable for Xpert Xpress SARS-CoV-2/FLU/RSV  testing.  Fact Sheet for Patients: PinkCheek.be  Fact Sheet for  Healthcare Providers: GravelBags.it  This test is not yet approved or cleared by the [place] FDA and  has been authorized for detection and/or diagnosis of SARS-CoV-2 by  FDA under an Emergency Use Authorization (EUA). This EUA will remain  in effect (meaning this test can be used) for the duration of the  Covid-19 declaration under Section 564(b)(1) of the Act, 21  U.S.C. section 360bbb-3(b)(1), unless the authorization is  terminated or revoked. Performed at [facility], [place] [address] [place], [place] [address]     Procedures and diagnostic studies:  DG Chest 2 View  Result Date: 08/13/2020 CLINICAL DATA:  Shortness of breath EXAM: CHEST - 2 VIEW COMPARISON:  August 13, 2020 FINDINGS: The cardiomediastinal silhouette is unchanged and enlarged in contour. Small RIGHT pleural effusion. No pneumothorax. Diffuse interstitial prominence and peribronchial cuffing, mildly increased in conspicuity in comparison to prior. RIGHT basilar heterogeneous opacity, likely atelectasis. Prominent azygos shadow. Visualized abdomen is unremarkable. Multilevel degenerative changes of the thoracic spine. IMPRESSION: Constellation of findings are favored to reflect pulmonary edema with a small RIGHT pleural effusion and scattered atelectasis. Differential considerations include atypical infection. Electronically Signed   By: Valentino Saxon MD   On: 08/13/2020 17:08    Medications:   . [START ON 08/21/2020] amiodarone  200 mg Oral BID  . amiodarone  400 mg Oral BID  . metoprolol succinate  100 mg Oral Daily  . pneumococcal 23 valent vaccine  0.5 mL Intramuscular Tomorrow-1000  . ramelteon  8 mg Oral QHS  . sodium chloride flush  3 mL Intravenous Q12H   Continuous Infusions: . heparin 1,500 Units/hr (08/15/20 0519)     LOS: 2 days   Geradine Girt  [facility]   How to contact the [facility] Attending or Consulting provider [place] or covering  provider during after hours [place], for this patient?  1. Check the care team in [facility] and look for a) attending/consulting [facility] provider listed and b) the [facility] team listed 2. Log into [email or web address] and use [place]'s universal password to access. If  you do not have the password, please contact the hospital operator. 3. Locate the [facility] provider you are looking for under [facility] and page to a number that you can be directly reached. 4. If you still have difficulty reaching the provider, please page the [facility] (Director on Call) for the Hospitalists listed on amion for assistance.  08/15/2020, 4:25 PM

## 2020-08-15 NOTE — Progress Notes (Signed)
Peripherally Inserted Central Catheter Placement  The IV Nurse has discussed with the patient and/or persons authorized to consent for the patient, the purpose of this procedure and the potential benefits and risks involved with this procedure.  The benefits include less needle sticks, lab draws from the catheter, and the patient may be discharged home with the catheter. Risks include, but not limited to, infection, bleeding, blood clot (thrombus formation), and puncture of an artery; nerve damage and irregular heartbeat and possibility to perform a PICC exchange if needed/ordered by physician.  Alternatives to this procedure were also discussed.  Bard Power PICC patient education guide, fact sheet on infection prevention and patient information card has been provided to patient /or left at bedside.    PICC Placement Documentation  PICC Triple Lumen 08/15/20 PICC Right Cephalic 40 cm 0 cm (Active)  Indication for Insertion or Continuance of Line Prolonged intravenous therapies 08/15/20 2040  Exposed Catheter (cm) 0 cm 08/15/20 2040  Site Assessment Clean;Dry;Intact 08/15/20 2040  Lumen #1 Status Capped (Central line);Flushed;Blood return noted 08/15/20 2040  Lumen #2 Status Capped (Central line);Flushed;Blood return noted 08/15/20 2040  Lumen #3 Status Capped (Central line);Flushed;Blood return noted 08/15/20 2040  Dressing Type Transparent;Occlusive;Securing device 08/15/20 2040  Dressing Status Intact;Dry;Clean 08/15/20 2040  Antimicrobial disc in place? Yes 08/15/20 2040  Safety Lock Not Applicable 61/47/09 2957  Line Care Connections checked and tightened 08/15/20 2040  Line Adjustment (NICU/IV Team Only) No 08/15/20 2040  Dressing Intervention New dressing 08/15/20 2040  Dressing Change Due 08/22/20 08/15/20 2040       Rosalio Macadamia Chenice 08/15/2020, 9:19 PM

## 2020-08-15 NOTE — Progress Notes (Addendum)
Progress Note  Patient Name: Christopher Wilkins Date of Encounter: 08/15/2020  [facility] Cardiologist: Donato Heinz, MD   Subjective   Reports improvement in breathing compared to when he came into the hospital. No complaints of chest pain or palpitations. Feels anxious at times.   Inpatient Medications    Scheduled Meds: . [START ON 08/21/2020] amiodarone  200 mg Oral BID  . amiodarone  400 mg Oral BID  . metoprolol succinate  100 mg Oral Daily  . pneumococcal 23 valent vaccine  0.5 mL Intramuscular Tomorrow-1000  . potassium chloride  40 mEq Oral BID  . ramelteon  8 mg Oral QHS  . sodium chloride flush  3 mL Intravenous Q12H  . sodium chloride flush  3 mL Intravenous Q12H   Continuous Infusions: . sodium chloride    . sodium chloride 50 mL/hr at 08/15/20 0817  . heparin 1,500 Units/hr (08/15/20 0519)   PRN Meds: sodium chloride, acetaminophen **OR** acetaminophen, sodium chloride flush, white petrolatum   Vital Signs    Vitals:   08/14/20 2020 08/15/20 0012 08/15/20 0520 08/15/20 0808  BP: 104/87 112/84 113/90 92/81  Pulse:  93  100  Resp: 20 18 20 18   Temp: 97.9 F (36.6 C) (!) 97.3 F (36.3 C) 97.7 F (36.5 C) 97.8 F (36.6 C)  TempSrc: Oral Oral Oral Oral  SpO2: 96% 94% 97% 97%  Weight:   92.8 kg   Height:        Intake/Output Summary (Last 24 hours) at 08/15/2020 0831 Last data filed at 08/15/2020 0644 Gross per 24 hour  Intake 490.48 ml  Output 1250 ml  Net -759.52 ml   Last 3 Weights 08/15/2020 08/14/2020 08/14/2020  Weight (lbs) 204 lb 9.6 oz 203 lb 1.6 oz 204 lb 4.8 oz  Weight (kg) 92.806 kg 92.126 kg 92.67 kg      Telemetry    Atrial fibrillation with rates predominately in the 80s-110s. Occasional NSVT - longest episode 7 beats - Personally Reviewed  ECG    No new tracings - Personally Reviewed  Physical Exam   GEN: No acute distress.   Neck: No JVD Cardiac: IRIR, no murmurs, rubs, or gallops.  Respiratory: rhonchi/rub at  lung bases, no crackles or wheezing.  GI: Soft, nontender, non-distended  MS: No edema; No deformity. Neuro:  Nonfocal  Psych: Normal affect   Labs    High Sensitivity Troponin:   Recent Labs  Lab 08/03/20 1440 08/03/20 1655 08/13/20 1643  TROPONINIHS 21* 27* 23*      Chemistry Recent Labs  Lab 08/13/20 1634 08/14/20 0349 08/15/20 0352  NA 141 140 142  K 3.5 3.1* 4.3  CL 109 107 109  CO2 22 21* 24  GLUCOSE 97 95 112*  BUN 19 21 22   CREATININE 1.40* 1.29* 1.66*  CALCIUM 9.1 8.6* 8.8*  GFRNONAA 56* >60 45*  ANIONGAP 10 12 9      Hematology Recent Labs  Lab 08/13/20 1634 08/14/20 0349 08/15/20 0352  WBC 6.3 5.0 6.1  RBC 5.84* 5.36 5.55  HGB 16.3 15.0 15.3  HCT 50.9 46.3 47.6  MCV 87.2 86.4 85.8  MCH 27.9 28.0 27.6  MCHC 32.0 32.4 32.1  RDW 14.8 14.6 14.6  PLT 288 277 255    BNP Recent Labs  Lab 08/13/20 1643  BNP 920.3*     DDimer No results for input(s): DDIMER in the last 168 hours.   Radiology    DG Chest 2 View  Result Date: 08/13/2020 CLINICAL  DATA:  Shortness of breath EXAM: CHEST - 2 VIEW COMPARISON:  August 13, 2020 FINDINGS: The cardiomediastinal silhouette is unchanged and enlarged in contour. Small RIGHT pleural effusion. No pneumothorax. Diffuse interstitial prominence and peribronchial cuffing, mildly increased in conspicuity in comparison to prior. RIGHT basilar heterogeneous opacity, likely atelectasis. Prominent azygos shadow. Visualized abdomen is unremarkable. Multilevel degenerative changes of the thoracic spine. IMPRESSION: Constellation of findings are favored to reflect pulmonary edema with a small RIGHT pleural effusion and scattered atelectasis. Differential considerations include atypical infection. Electronically Signed   By: Valentino Saxon MD   On: 08/13/2020 17:08   DG Chest 2 View  Result Date: 08/13/2020 CLINICAL DATA:  Shortness of breath. EXAM: CHEST - 2 VIEW COMPARISON:  August 03, 2020. FINDINGS: Stable  cardiomediastinal silhouette. No pneumothorax is noted. Left lung is clear. Minimal right pleural effusion is noted. Minimal right basilar atelectasis or infiltrate is noted. The visualized skeletal structures are unremarkable. IMPRESSION: Minimal right basilar atelectasis or infiltrate is noted with minimal right pleural effusion. Electronically Signed   By: Marijo Conception M.D.   On: 08/13/2020 13:13    Cardiac Studies   Echocardiogram 06/19/20: 1. Left ventricular ejection fraction, by estimation, is 35 to 40%. The  left ventricle has moderately decreased function. The left ventricle  demonstrates global hypokinesis. The left ventricular internal cavity size  was moderately dilated. Left  ventricular diastolic function could not be evaluated. There is severe  hypokinesis of the left ventricular, entire anteroseptal wall and anterior  wall.  2. Right ventricular systolic function is low normal. The right  ventricular size is normal. Tricuspid regurgitation signal is inadequate  for assessing PA pressure.  3. Left atrial size was severely dilated.  4. Right atrial size was mildly dilated.  5. Restricted posterior leaflet with heavy calcification of annular and  posteromedial papillary muscles. The mitral valve is abnormal. Mild mitral  valve regurgitation. Moderate mitral annular calcification.  6. The aortic valve is calcified. There is moderate calcification of the  aortic valve. There is moderate thickening of the aortic valve. Aortic  valve regurgitation is not visualized.  7. The inferior vena cava is dilated in size with >50% respiratory  variability, suggesting right atrial pressure of 8 mmHg.   TEE 06/20/20: 1. Left ventricular ejection fraction, by estimation, is 30 to 35%. The  left ventricle has moderately decreased function.  2. Right ventricular systolic function is normal. The right ventricular  size is normal.  3. Left atrial size was severely dilated. No left  atrial/left atrial  appendage thrombus was detected.  4. Right atrial size was severely dilated.  5. 3D images of mitral valve taken. The mitral valve is degenerative.  Moderate mitral valve regurgitation. There is moderate holosystolic  prolapse of the middle scallop of the posterior leaflet of the mitral  valve.  6. The aortic valve is tricuspid. Aortic valve regurgitation is not  visualized. Mild aortic valve sclerosis is present, with no evidence of  aortic valve stenosis.   Patient Profile     64 y.o. male with a PMH of chronic combined CHF, paroxysmal atrial fibrillation, HTN, obesity, and tobacco abuse, who is being followed by cardiology for CHF and Afib.  Assessment & Plan    1. Acute on chronic combined CHF: recently diagnosed with EF 35-40% on Echo 06/2020. Felt to be tachycardia medicated in the setting of Afib with RVR. He underwent successful DCCV during admission 06/2020, though had recurrent atrial fibrillation with RVR during an admission  07/2020 with associated acute on chronic CHF. Again back with worsening SOB. BNP 920. He was given IV lasix with UOP net -1.0L in the past 24 hours and -1.9L this admission. Lasix held yesterday as he was felt to be euvolemic. Plan was for [facility] today, however Cr bumped from 1.29-1.66 overnight (baseline 1.1-1.2).  - Will hydrate this morning with NS @ 50cc/hr - Recheck BMET at 12pm - if Cr improved, will plan for [facility] this afternoon to r/o ischemic etiology of his CHF - Continue to hold diuretics for now - will hopefully assess volume status with RHC this afternoon - Continue metoprolol succinate - GDMT is limited by angioedema with ACEi and cost of entresto.  Plan to start losartan after cath - Continue to monitor strict I&Os and daily weights - Continue to monitor electrolytes and replete as needed to maintain K >4, Mg >2  2. Persistent atrial fibrillation with RVR: initially diagnosed 06/2020 and underwent successful TEE/DCCV.  Suspicions high for reoccurrence given severe LAE. He was in sinus rhythm at follow-up visit 06/2020, however back in Afib with RVR during hospitalization 07/2020. He was started on amiodarone yesterday with plans to consider TEE/DCCV on Monday. Rates have been in the 80s-110s - Plan for TEE/DCCV on Monday - hopefully will maintain sinus rhythm with addition of antiarrhythmic - Continue heparin gtt for now - anticipate restarting eliquis 5mg  BID prior to discharge - Continue metoprolol succinate for rate control - Continue amiodarone for rhythm control   3. HTN: BP stable - Continue metoprolol succinate  4. Tobacco abuse:  - Continue to encourage cessation   For questions or updates, please contact [place] Please consult [email or web address] for contact info under     Signed, Abigail Butts, PA-C  08/15/2020, 8:31 AM     Patient seen and examined.  Agree with above documentation.  On exam, patient is alert and oriented, normal rate, regular rhythm, no murmurs, lungs CTAB, no LE edema or JVD.  Telemetry personally reviewed and shows A. fib with rate 90s, NSVT up to 7 beats.  Reports dyspnea has improved.  Denies any chest pain.  Creatinine increased from 1.29 to 1.66 today.  Suspect overdiuresis.  Will hold Lasix.  Will give gentle IV fluids and repeat BMP at noon.  If renal function improved, will proceed with RHC/LHC today.  If worsening renal function, will hold off on cath.  Continue to load with amiodarone and plan TEE/DCCV early next week.  Donato Heinz, MD

## 2020-08-15 NOTE — Progress Notes (Addendum)
Renal function worsening despite diuresis yesterday, and continued to worsen with holding diuresis today.  Cath was cancelled.  Lactate is elevated.  Repeat echo reviewed, LV function appears worse, 20-25%.  He remains hemodynamically stable. PICC line placed and CVP 15, Co-ox 44%.  Will start milrinone 0.125 and diurese with IV lasix 40 mg BID.  Discontinued metoprolol for rate control of his AF, will start amiodarone gtt.  Likely plan LHC/RHC on Monday to rule out CAD as cause of his heart failure and then TEE/DCCV on Tuesday or Wednesday.  D/w Dr Haroldine Laws in [place] Failure.  Donato Heinz, MD

## 2020-08-16 DIAGNOSIS — I5023 Acute on chronic systolic (congestive) heart failure: Secondary | ICD-10-CM

## 2020-08-16 DIAGNOSIS — Z95828 Presence of other vascular implants and grafts: Secondary | ICD-10-CM

## 2020-08-16 DIAGNOSIS — I509 Heart failure, unspecified: Secondary | ICD-10-CM

## 2020-08-16 DIAGNOSIS — R570 Cardiogenic shock: Secondary | ICD-10-CM | POA: Diagnosis not present

## 2020-08-16 DIAGNOSIS — I5043 Acute on chronic combined systolic (congestive) and diastolic (congestive) heart failure: Secondary | ICD-10-CM | POA: Diagnosis not present

## 2020-08-16 LAB — BASIC METABOLIC PANEL
Anion gap: 12 (ref 5–15)
BUN: 27 mg/dL — ABNORMAL HIGH (ref 8–23)
CO2: 21 mmol/L — ABNORMAL LOW (ref 22–32)
Calcium: 8.7 mg/dL — ABNORMAL LOW (ref 8.9–10.3)
Chloride: 106 mmol/L (ref 98–111)
Creatinine, Ser: 1.58 mg/dL — ABNORMAL HIGH (ref 0.61–1.24)
GFR, Estimated: 48 mL/min — ABNORMAL LOW (ref >60)
Glucose, Bld: 190 mg/dL — ABNORMAL HIGH (ref 70–99)
Potassium: 3.9 mmol/L (ref 3.5–5.1)
Sodium: 139 mmol/L (ref 135–145)

## 2020-08-16 LAB — CBC
HCT: 47.2 % (ref 39.0–52.0)
Hemoglobin: 15.3 g/dL (ref 13.0–17.0)
MCH: 27.9 pg (ref 26.0–34.0)
MCHC: 32.4 g/dL (ref 30.0–36.0)
MCV: 86.1 fL (ref 80.0–100.0)
Platelets: 279 10*3/uL (ref 150–400)
RBC: 5.48 MIL/uL (ref 4.22–5.81)
RDW: 14.6 % (ref 11.5–15.5)
WBC: 6.3 10*3/uL (ref 4.0–10.5)
nRBC: 0 % (ref 0.0–0.2)

## 2020-08-16 LAB — HEPARIN LEVEL (UNFRACTIONATED): Heparin Unfractionated: 0.9 IU/mL — ABNORMAL HIGH (ref 0.30–0.70)

## 2020-08-16 LAB — LACTIC ACID, PLASMA: Lactic Acid, Venous: 1.7 mmol/L (ref 0.5–1.9)

## 2020-08-16 LAB — COOXEMETRY PANEL
Carboxyhemoglobin: 0.9 % (ref 0.5–1.5)
Methemoglobin: 0.8 % (ref 0.0–1.5)
O2 Saturation: 68 %
Total hemoglobin: 14.9 g/dL (ref 12.0–16.0)

## 2020-08-16 LAB — APTT: aPTT: 97 seconds — ABNORMAL HIGH (ref 24–36)

## 2020-08-16 MED ORDER — FUROSEMIDE 10 MG/ML IJ SOLN
80.0000 mg | Freq: Two times a day (BID) | INTRAMUSCULAR | Status: DC
  Administered 2020-08-16 (×2): 80 mg via INTRAVENOUS
  Filled 2020-08-16 (×2): qty 8

## 2020-08-16 MED ORDER — SPIRONOLACTONE 12.5 MG HALF TABLET
12.5000 mg | ORAL_TABLET | Freq: Every day | ORAL | Status: DC
  Administered 2020-08-16 – 2020-08-17 (×2): 12.5 mg via ORAL
  Filled 2020-08-16 (×2): qty 1

## 2020-08-16 MED ORDER — DIGOXIN 125 MCG PO TABS
0.1250 mg | ORAL_TABLET | Freq: Every day | ORAL | Status: DC
  Administered 2020-08-16 – 2020-08-21 (×6): 0.125 mg via ORAL
  Filled 2020-08-16 (×6): qty 1

## 2020-08-16 MED ORDER — AMIODARONE LOAD VIA INFUSION
150.0000 mg | Freq: Once | INTRAVENOUS | Status: AC
  Administered 2020-08-16: 150 mg via INTRAVENOUS
  Filled 2020-08-16: qty 83.34

## 2020-08-16 NOTE — Progress Notes (Signed)
Progress Note    [name]  WUJ:[id number] DOB: 1956/02/12  DOA: 08/13/2020 PCP: Rogers Blocker, MD    Brief Narrative:     Medical records reviewed and are as summarized below:  Christopher Wilkins is an 64 y.o. male with medical history significant of atrial fibrillation, hypertension, CHF (EF 30-35% on 06/20/2020) who presents with worsening dyspnea and lower extremity edema. Patient recently admitted from 10/24-10/26 for heart failure exacerbation believed to be secondary to atrial fibrillation which was not rate controlled. EF this hospitalization was found to be lower at 25%    Assessment/Plan:   Principal Problem:   Acute exacerbation of CHF (congestive heart failure) (HCC) Active Problems:   Essential hypertension   A-fib (HCC)   Cardiogenic shock (HCC)   Acute on chronic systolic CHF (95-62% recently now 20%) with cardiogenic shock -Recent admission for the same thought to be due to poorly controlled A. fib, discharged 10/26 - Progressively worsening symptoms since discharge -IV lasix - Daily weights, strict I/O -down 2.8L? -cardiology consult:   - R/L cath Monday followed by TEE/DC-CV  - continue milrinone and IV lasix   - add spiro and dig  Atrial fibrillation -amiodarone added 11/4 -eliquis changed to heparin gtt for cath  Hypokalemia/hypomagnesemia -replete IV and PO  Hyperglycemia -check HgbA1c   Family Communication/Anticipated D/C date and plan/Code Status   DVT prophylaxis: eliquis held and heparin gtt started for possible cath Code Status: Full Code.  Disposition Plan: Status is: Inpatient  Remains inpatient appropriate because:IV treatments appropriate due to intensity of illness or inability to take PO   Dispo: The patient is from: Home              Anticipated d/c is to: Home              Anticipated d/c date is: 2 days              Patient currently is not medically stable to d/c.  Need for cath, on milrinone         Medical  Consultants:    cards.     Subjective:   No current complaints   Objective:    Vitals:   08/16/20 0033 08/16/20 0614 08/16/20 0755 08/16/20 0933  BP: (!) 122/99 (!) 123/97 119/90   Pulse: 89 98 88 90  Resp:  16 18   Temp:  97.9 F (36.6 C) 98.2 F (36.8 C)   TempSrc: Oral Oral Oral   SpO2: 92%  98%   Weight:  92 kg    Height:        Intake/Output Summary (Last 24 hours) at 08/16/2020 1135 Last data filed at 08/16/2020 0514 Gross per 24 hour  Intake 33 ml  Output 975 ml  Net -942 ml   Filed Weights   08/14/20 1825 08/15/20 0520 08/16/20 0614  Weight: 92.1 kg 92.8 kg 92 kg    Exam:   General: Appearance:    Obese male in no acute distress     Lungs:      respirations unlabored  Heart:    Normal heart rate. irr, No murmurs, rubs, or gallops.   MS:   All extremities are intact.   Neurologic:   Awake, alert, oriented x 3. No apparent focal neurological           defect.       Data Reviewed:   I have personally reviewed following labs and imaging studies:  Labs: Labs show  the following:   Basic Metabolic Panel: Recent Labs  Lab 08/13/20 1634 08/13/20 1634 08/14/20 0349 08/14/20 0349 08/15/20 0352 08/15/20 0352 08/15/20 1322 08/16/20 0145  NA 141  --  140  --  142  --  141 139  K 3.5   < > 3.1*   < > 4.3   < > 4.3 3.9  CL 109  --  107  --  109  --  108 106  CO2 22  --  21*  --  24  --  20* 21*  GLUCOSE 97  --  95  --  112*  --  130* 190*  BUN 19  --  21  --  22  --  23 27*  CREATININE 1.40*  --  1.29*  --  1.66*  --  1.74* 1.58*  CALCIUM 9.1  --  8.6*  --  8.8*  --  8.8* 8.7*  MG  --   --  1.8  --  2.2  --   --   --    < > = values in this interval not displayed.   GFR Estimated Creatinine Clearance: 49.5 mL/min (A) (by C-G formula based on SCr of 1.58 mg/dL (H)). Liver Function Tests: No results for input(s): AST, ALT, ALKPHOS, BILITOT, PROT, ALBUMIN in the last 168 hours. No results for input(s): LIPASE, AMYLASE in the last 168  hours. No results for input(s): AMMONIA in the last 168 hours. Coagulation profile No results for input(s): INR, PROTIME in the last 168 hours.  CBC: Recent Labs  Lab 08/13/20 1634 08/14/20 0349 08/15/20 0352 08/16/20 0145  WBC 6.3 5.0 6.1 6.3  HGB 16.3 15.0 15.3 15.3  HCT 50.9 46.3 47.6 47.2  MCV 87.2 86.4 85.8 86.1  PLT 288 277 255 279   Cardiac Enzymes: No results for input(s): CKTOTAL, CKMB, CKMBINDEX, TROPONINI in the last 168 hours. BNP (last 3 results) No results for input(s): PROBNP in the last 8760 hours. CBG: No results for input(s): GLUCAP in the last 168 hours. D-Dimer: No results for input(s): DDIMER in the last 72 hours. Hgb A1c: No results for input(s): HGBA1C in the last 72 hours. Lipid Profile: No results for input(s): CHOL, HDL, LDLCALC, TRIG, CHOLHDL, LDLDIRECT in the last 72 hours. Thyroid function studies: No results for input(s): TSH, T4TOTAL, T3FREE, THYROIDAB in the last 72 hours.  Invalid input(s): FREET3 Anemia work up: No results for input(s): VITAMINB12, FOLATE, FERRITIN, TIBC, IRON, RETICCTPCT in the last 72 hours. Sepsis Labs: Recent Labs  Lab 08/13/20 1634 08/14/20 0349 08/15/20 0352 08/15/20 1620 08/15/20 1915 08/16/20 0145  WBC 6.3 5.0 6.1  --   --  6.3  LATICACIDVEN  --   --   --  2.5* 2.2* 1.7    Microbiology Recent Results (from the past 240 hour(s))  Respiratory Panel by RT PCR (Flu A&B, Covid) - Nasopharyngeal Swab     Status: None   Collection Time: 08/13/20  6:36 PM   Specimen: Nasopharyngeal Swab  Result Value Ref Range Status   SARS Coronavirus 2 by RT PCR NEGATIVE NEGATIVE Final    Comment: (NOTE) SARS-CoV-2 target nucleic acids are NOT DETECTED.  The SARS-CoV-2 RNA is generally detectable in upper respiratoy specimens during the acute phase of infection. The lowest concentration of SARS-CoV-2 viral copies this assay can detect is 131 copies/mL. A negative result does not preclude SARS-Cov-2 infection and should  not be used as the sole basis for treatment or other patient management decisions. A negative  result may occur with  improper specimen collection/handling, submission of specimen other than nasopharyngeal swab, presence of viral mutation(s) within the areas targeted by this assay, and inadequate number of viral copies (<131 copies/mL). A negative result must be combined with clinical observations, patient history, and epidemiological information. The expected result is Negative.  Fact Sheet for Patients:  PinkCheek.be  Fact Sheet for Healthcare Providers:  GravelBags.it  This test is no t yet approved or cleared by the [place] FDA and  has been authorized for detection and/or diagnosis of SARS-CoV-2 by FDA under an Emergency Use Authorization (EUA). This EUA will remain  in effect (meaning this test can be used) for the duration of the COVID-19 declaration under Section 564(b)(1) of the Act, 21 U.S.C. section 360bbb-3(b)(1), unless the authorization is terminated or revoked sooner.     Influenza A by PCR NEGATIVE NEGATIVE Final   Influenza B by PCR NEGATIVE NEGATIVE Final    Comment: (NOTE) The Xpert Xpress SARS-CoV-2/FLU/RSV assay is intended as an aid in  the diagnosis of influenza from Nasopharyngeal swab specimens and  should not be used as a sole basis for treatment. Nasal washings and  aspirates are unacceptable for Xpert Xpress SARS-CoV-2/FLU/RSV  testing.  Fact Sheet for Patients: PinkCheek.be  Fact Sheet for Healthcare Providers: GravelBags.it  This test is not yet approved or cleared by the [place] FDA and  has been authorized for detection and/or diagnosis of SARS-CoV-2 by  FDA under an Emergency Use Authorization (EUA). This EUA will remain  in effect (meaning this test can be used) for the duration of the  Covid-19 declaration under  Section 564(b)(1) of the Act, 21  U.S.C. section 360bbb-3(b)(1), unless the authorization is  terminated or revoked. Performed at [facility], [place] [address], [place], [place] [address]     Procedures and diagnostic studies:  DG CHEST PORT 1 VIEW  Result Date: 08/15/2020 CLINICAL DATA:  PICC line placement EXAM: PORTABLE CHEST 1 VIEW COMPARISON:  Radiograph 08/13/2020 FINDINGS: Right upper extremity PICC tip terminates at the superior cavoatrial junction. Mid to lower lung opacities suspected to reflect a combination of interstitial edema and atelectasis in the setting of CHF with cardiomegaly. No pneumothorax or visible effusion though the costophrenic sulci are largely collimated from view. Remaining cardiomediastinal contours are unremarkable. No acute osseous or soft tissue abnormality. Telemetry leads overlie the chest. IMPRESSION: 1. Right upper extremity PICC terminates at the superior cavoatrial junction. 2. Increasing opacities in the mid to lower lungs favored to reflect edema and atelectasis though underlying infection is not fully excluded. Electronically Signed   By: Lovena Le M.D.   On: 08/15/2020 21:27   ECHOCARDIOGRAM LIMITED  Result Date: 08/15/2020    ECHOCARDIOGRAM LIMITED REPORT   Patient Name:   Christopher Wilkins Date of Exam: 08/15/2020 Medical Rec #:  [id number]     Height:       66.0 in Accession #:    [id number]    Weight:       204.6 lb Date of Birth:  09-22-1956     BSA:          2.019 m Patient Age:    20 years      BP:           108/84 mmHg Patient Gender: M             HR:           120 bpm. Exam Location:  Inpatient Procedure: Limited  Echo Indications:    I48.91* Unspecified atrial fibrillation  History:        Patient has prior history of Echocardiogram examinations, most                 recent 06/20/2020. CHF, Arrythmias:Atrial Fibrillation; Risk                 Factors:Hypertension.  Sonographer:    Raquel Sarna Senior RDCS Referring Phys: [id number]  1. Difficult apical windows (angle, foreshortened) COmpared to echo from September 2021, LVEF appears to be a little lower . Left ventricular ejection fraction, by estimation, is 25%. The left ventricle demonstrates global hypokinesis.  2. Right ventricular systolic function was not well visualized.  3. Limited echo. FINDINGS  Left Ventricle: Difficult apical windows (angle, foreshortened) COmpared to echo from September 2021, LVEF appears to be a little lower. Left ventricular ejection fraction, by estimation, is 25%%. The left ventricle has severely decreased function. The left ventricle demonstrates global hypokinesis. Right Ventricle: The right ventricular size is not well visualized. Right vetricular wall thickness was not assessed. Right ventricular systolic function was not well visualized. Pericardium: There is no evidence of pericardial effusion. Mitral Valve: The mitral valve is abnormal. There is moderate thickening of the mitral valve leaflet(s). There is mild calcification of the mitral valve leaflet(s). Tricuspid Valve: The tricuspid valve is grossly normal. Aortic Valve: The aortic valve is calcified. Venous: The inferior vena cava is dilated in size with less than 50% respiratory variability, suggesting right atrial pressure of 15 mmHg.  LV Volumes (MOD) LV vol d, MOD A2C: 141.0 ml LV vol d, MOD A4C: 139.0 ml LV vol s, MOD A2C: 115.0 ml LV vol s, MOD A4C: 105.0 ml LV SV MOD A2C:     26.0 ml LV SV MOD A4C:     139.0 ml LV SV MOD BP:      29.3 ml Dorris Carnes MD Electronically signed by Dorris Carnes MD Signature Date/Time: 08/15/2020/8:46:39 PM    Final    [place] EKG SITE RITE  Result Date: 08/15/2020 If Site Rite image not attached, placement could not be confirmed due to current cardiac rhythm.   Medications:   . Chlorhexidine Gluconate Cloth  6 each Topical Daily  . digoxin  0.125 mg Oral Daily  . furosemide  80 mg Intravenous BID  . pneumococcal 23 valent vaccine  0.5 mL  Intramuscular Tomorrow-1000  . ramelteon  8 mg Oral QHS  . sodium chloride flush  10-40 mL Intracatheter Q12H  . sodium chloride flush  3 mL Intravenous Q12H  . spironolactone  12.5 mg Oral Daily   Continuous Infusions: . amiodarone 30 mg/hr (08/16/20 0737)  . heparin 1,500 Units/hr (08/16/20 0558)  . milrinone 0.125 mcg/kg/min (08/16/20 0121)     LOS: 3 days   [facility]   How to contact the [facility] Attending or Consulting provider [place] or covering provider during after hours [place], for this patient?  1. Check the care team in [facility] and look for a) attending/consulting [facility] provider listed and b) the [facility] team listed 2. Log into [email or web address] and use [facility]'s universal password to access. If you do not have the password, please contact the hospital operator. 3. Locate the [facility] provider you are looking for under [facility] and page to a number that you can be directly reached. 4. If you still have difficulty reaching the provider, please page the [facility] (Director  on Call) for the Hospitalists listed on amion for assistance.  08/16/2020, 11:35 AM

## 2020-08-16 NOTE — Consult Note (Signed)
Advanced Heart Failure Team Consult Note   Primary Physician: Rogers Blocker, MD PCP-Cardiologist:  Donato Heinz, MD  Reason for Consultation: Cardiogenic shock   HPI:    Christopher Wilkins is seen today for evaluation of cardiogenic shock at the request of Dr. Gardiner Rhyme  64 y/o obesity, HTN, tobacco use.  Admitted 06/2020 with acute onset CHF and afib. Echo showed LVEF of 35-40%. Underwent TEE/DCCV with restoration of sinus rhythm. Severe LAE at risk for early recurrence. CHADS2-VASc score of 2. Discharged on Eliquis, Entresto, Toprol and lasix.   Readmitted with recurrent HF. EF 20% developed shock with AKI and lactic acidosis.   Co-ox 44%. CVP 14  Started on milrinone. Now feeling better No SOB, orthopnea or PND.   Review of Systems: [y] = yes, [ ]  = no    General: Weight gain [ ] ; Weight loss [ ] ; Anorexia [ ] ; Fatigue [ ] ; Fever [ ] ; Chills [ ] ; Weakness [ ]    Cardiac: Chest pain/pressure [ ] ; Resting SOB [ y; Exertional SOB Blue.Reese ]; Orthopnea [ ] ; Pedal Edema [ ] ; Palpitations [ ] ; Syncope [ ] ; Presyncope [ ] ; Paroxysmal nocturnal dyspnea[ ]    Pulmonary: Cough [ ] ; Wheezing[ ] ; Hemoptysis[ ] ; Sputum [ ] ; Snoring [ ]    GI: Vomiting[ ] ; Dysphagia[ ] ; Melena[ ] ; Hematochezia [ ] ; Heartburn[ ] ; Abdominal pain [ ] ; Constipation [ ] ; Diarrhea [ ] ; BRBPR [ ]    GU: Hematuria[ ] ; Dysuria [ ] ; Nocturia[ ]    Vascular: Pain in legs with walking [ ] ; Pain in feet with lying flat [ ] ; Non-healing sores [ ] ; Stroke [ ] ; TIA [ ] ; Slurred speech [ ] ;   Neuro: Headaches[ ] ; Vertigo[ ] ; Seizures[ ] ; Paresthesias[ ] ;Blurred vision [ ] ; Diplopia [ ] ; Vision changes [ ]    Ortho/Skin: Arthritis Blue.Reese ]; Joint pain Blue.Reese ]; Muscle pain [ ] ; Joint swelling [ ] ; Back Pain [ ] ; Rash [ ]    Psych: Depression[ ] ; Anxiety[ ]    Heme: Bleeding problems [ ] ; Clotting disorders [ ] ; Anemia [ ]    Endocrine: Diabetes [ ] ; Thyroid dysfunction[ ]   Home Medications Prior to Admission medications     Medication Sig Start Date End Date Taking? Authorizing Provider  apixaban (ELIQUIS) 5 MG TABS tablet Take 1 tablet (5 mg total) by mouth 2 (two) times daily. 07/11/20  Yes Fulp, Cammie, MD  furosemide (LASIX) 40 MG tablet Take 1 tablet (40 mg total) by mouth daily. 08/06/20 09/05/20 Yes Kyle, Tyrone A, DO  metoprolol succinate (TOPROL XL) 100 MG 24 hr tablet Take 1 tablet (100 mg total) by mouth daily. Take with or immediately following a meal. 08/05/20 09/04/20 Yes Jonnie Finner, DO    Past Medical History: Past Medical History:  Diagnosis Date   Atrial fibrillation ([place])    CHF (congestive heart failure) ([place])    Dyspnea    Hypertension    Insomnia     Past Surgical History: Past Surgical History:  Procedure Laterality Date   CARDIOVERSION N/A 06/20/2020   Procedure: CARDIOVERSION;  Surgeon: Jerline Pain, MD;  Location: [place];  Service: Cardiovascular;  Laterality: N/A;   CHOLECYSTECTOMY     OTHER SURGICAL HISTORY     "Shot a couple of times"   TEE WITHOUT CARDIOVERSION N/A 06/20/2020   Procedure: TRANSESOPHAGEAL ECHOCARDIOGRAM (TEE);  Surgeon: Jerline Pain, MD;  Location: [facility] ENDOSCOPY;  Service: Cardiovascular;  Laterality: N/A;    Family History: Family History  Problem Relation Age  of Onset   Colon cancer Neg Hx     Social History: Social History   Socioeconomic History   Marital status: Single    Spouse name: Not on file   Number of children: Not on file   Years of education: Not on file   Highest education level: Not on file  Occupational History   Not on file  Tobacco Use   Smoking status: Former Smoker    Packs/day: 0.25    Types: Cigarettes    Quit date: 05/14/2020    Years since quitting: 0.2   Smokeless tobacco: Never Used  Vaping Use   Vaping Use: Never used  Substance and Sexual Activity   Alcohol use: Yes    Alcohol/week: 0.0 standard drinks    Comment: occasionally; once every 2 weeks   Drug use: No   Sexual  activity: Not on file  Other Topics Concern   Not on file  Social History Narrative   Not on file   Social Determinants of Health   Financial Resource Strain:    Difficulty of Paying Living Expenses: Not on file  Food Insecurity:    Worried About [place] in the Last Year: Not on file   Ran Out of Food in the Last Year: Not on file  Transportation Needs:    Lack of Transportation (Medical): Not on file   Lack of Transportation (Non-Medical): Not on file  Physical Activity:    Days of Exercise per Week: Not on file   Minutes of Exercise per Session: Not on file  Stress:    Feeling of Stress : Not on file  Social Connections:    Frequency of Communication with Friends and Family: Not on file   Frequency of Social Gatherings with Friends and Family: Not on file   Attends Religious Services: Not on file   Active Member of Clubs or Organizations: Not on file   Attends Archivist Meetings: Not on file   Marital Status: Not on file    Allergies:  Allergies  Allergen Reactions   Codeine Nausea And Vomiting   Lisinopril Other (See Comments)    "Didn't do well in my body"   Shellfish Allergy Other (See Comments)    Can tolerate shrimp in 2021 (??)    Objective:    Vital Signs:   Temp:  [97.6 F (36.4 C)-98.1 F (36.7 C)] 98.1 F (36.7 C) (11/05 2147) Pulse Rate:  [89-107] 89 (11/06 0033) Resp:  [17-20] 18 (11/05 2147) BP: (92-122)/(81-99) 122/99 (11/06 0033) SpO2:  [92 %-97 %] 92 % (11/06 0033) Weight:  [92.8 kg] 92.8 kg (11/05 0520) Last BM Date: 08/12/20  Weight change: Filed Weights   08/14/20 1116 08/14/20 1825 08/15/20 0520  Weight: 92.7 kg 92.1 kg 92.8 kg    Intake/Output:   Intake/Output Summary (Last 24 hours) at 08/16/2020 0358 Last data filed at 08/16/2020 0309 Gross per 24 hour  Intake 280.48 ml  Output 725 ml  Net -444.52 ml      Physical Exam    General:  Lying in bed . No resp difficulty HEENT:  normal Neck: supple. JVP to jaw . Carotids 2+ bilat; no bruits. No lymphadenopathy or thyromegaly appreciated. Cor: PMI nondisplaced. Irregular rate & rhythm. No rubs, gallops or murmurs. Lungs: clear Abdomen: soft, nontender, nondistended. No hepatosplenomegaly. No bruits or masses. Good bowel sounds. Extremities: no cyanosis, clubbing, rash, edema Neuro: alert & orientedx3, cranial nerves grossly intact. moves all 4 extremities w/o difficulty. Affect  pleasant   Telemetry   AF 80-90s Personally reviewed  EKG    AF 94 LCH narrow qrs Personally reviewed   Labs   Basic Metabolic Panel: Recent Labs  Lab 08/13/20 1634 08/13/20 1634 08/14/20 0349 08/14/20 0349 08/15/20 0352 08/15/20 1322 08/16/20 0145  NA 141  --  140  --  142 141 139  K 3.5  --  3.1*  --  4.3 4.3 3.9  CL 109  --  107  --  109 108 106  CO2 22  --  21*  --  24 20* 21*  GLUCOSE 97  --  95  --  112* 130* 190*  BUN 19  --  21  --  22 23 27*  CREATININE 1.40*  --  1.29*  --  1.66* 1.74* 1.58*  CALCIUM 9.1   < > 8.6*   < > 8.8* 8.8* 8.7*  MG  --   --  1.8  --  2.2  --   --    < > = values in this interval not displayed.    Liver Function Tests: No results for input(s): AST, ALT, ALKPHOS, BILITOT, PROT, ALBUMIN in the last 168 hours. No results for input(s): LIPASE, AMYLASE in the last 168 hours. No results for input(s): AMMONIA in the last 168 hours.  CBC: Recent Labs  Lab 08/13/20 1634 08/14/20 0349 08/15/20 0352 08/16/20 0145  WBC 6.3 5.0 6.1 6.3  HGB 16.3 15.0 15.3 15.3  HCT 50.9 46.3 47.6 47.2  MCV 87.2 86.4 85.8 86.1  PLT 288 277 255 279    Cardiac Enzymes: No results for input(s): CKTOTAL, CKMB, CKMBINDEX, TROPONINI in the last 168 hours.  BNP: BNP (last 3 results) Recent Labs    06/19/20 0120 08/03/20 1440 08/13/20 1643  BNP 478.8* 411.6* 920.3*    ProBNP (last 3 results) No results for input(s): PROBNP in the last 8760 hours.   CBG: No results for input(s): GLUCAP in the  last 168 hours.  Coagulation Studies: No results for input(s): LABPROT, INR in the last 72 hours.   Imaging   DG CHEST PORT 1 VIEW  Result Date: 08/15/2020 CLINICAL DATA:  PICC line placement EXAM: PORTABLE CHEST 1 VIEW COMPARISON:  Radiograph 08/13/2020 FINDINGS: Right upper extremity PICC tip terminates at the superior cavoatrial junction. Mid to lower lung opacities suspected to reflect a combination of interstitial edema and atelectasis in the setting of CHF with cardiomegaly. No pneumothorax or visible effusion though the costophrenic sulci are largely collimated from view. Remaining cardiomediastinal contours are unremarkable. No acute osseous or soft tissue abnormality. Telemetry leads overlie the chest. IMPRESSION: 1. Right upper extremity PICC terminates at the superior cavoatrial junction. 2. Increasing opacities in the mid to lower lungs favored to reflect edema and atelectasis though underlying infection is not fully excluded. Electronically Signed   By: Lovena Le M.D.   On: 08/15/2020 21:27   ECHOCARDIOGRAM LIMITED  Result Date: 08/15/2020    ECHOCARDIOGRAM LIMITED REPORT   Patient Name:   Christopher Wilkins Date of Exam: 08/15/2020 Medical Rec #:  [id number]     Height:       66.0 in Accession #:    [id number]    Weight:       204.6 lb Date of Birth:  [birth year]     BSA:          2.019 m Patient Age:    61 years      BP:  108/84 mmHg Patient Gender: M             HR:           120 bpm. Exam Location:  Inpatient Procedure: Limited Echo Indications:    I48.91* Unspecified atrial fibrillation  History:        Patient has prior history of Echocardiogram examinations, most                 recent 06/20/2020. CHF, Arrythmias:Atrial Fibrillation; Risk                 Factors:Hypertension.  Sonographer:    Raquel Sarna Senior RDCS Referring Phys: [id number] [place]  1. Difficult apical windows (angle, foreshortened) COmpared to echo from September 2021, LVEF appears to be a little  lower . Left ventricular ejection fraction, by estimation, is 25%. The left ventricle demonstrates global hypokinesis.  2. Right ventricular systolic function was not well visualized.  3. Limited echo. FINDINGS  Left Ventricle: Difficult apical windows (angle, foreshortened) COmpared to echo from September 2021, LVEF appears to be a little lower. Left ventricular ejection fraction, by estimation, is 25%%. The left ventricle has severely decreased function. The left ventricle demonstrates global hypokinesis. Right Ventricle: The right ventricular size is not well visualized. Right vetricular wall thickness was not assessed. Right ventricular systolic function was not well visualized. Pericardium: There is no evidence of pericardial effusion. Mitral Valve: The mitral valve is abnormal. There is moderate thickening of the mitral valve leaflet(s). There is mild calcification of the mitral valve leaflet(s). Tricuspid Valve: The tricuspid valve is grossly normal. Aortic Valve: The aortic valve is calcified. Venous: The inferior vena cava is dilated in size with less than 50% respiratory variability, suggesting right atrial pressure of 15 mmHg.  LV Volumes (MOD) LV vol d, MOD A2C: 141.0 ml LV vol d, MOD A4C: 139.0 ml LV vol s, MOD A2C: 115.0 ml LV vol s, MOD A4C: 105.0 ml LV SV MOD A2C:     26.0 ml LV SV MOD A4C:     139.0 ml LV SV MOD BP:      29.3 ml Dorris Carnes MD Electronically signed by Dorris Carnes MD Signature Date/Time: 08/15/2020/8:46:39 PM    Final    [place] EKG SITE RITE  Result Date: 08/15/2020 If Site Rite image not attached, placement could not be confirmed due to current cardiac rhythm.     Medications:     Current Medications:  Chlorhexidine Gluconate Cloth  6 each Topical Daily   furosemide  40 mg Intravenous BID   pneumococcal 23 valent vaccine  0.5 mL Intramuscular Tomorrow-1000   ramelteon  8 mg Oral QHS   sodium chloride flush  10-40 mL Intracatheter Q12H   sodium chloride flush  3 mL  Intravenous Q12H     Infusions:  amiodarone 30 mg/hr (08/16/20 0308)   heparin 1,500 Units/hr (08/15/20 0519)   milrinone 0.125 mcg/kg/min (08/16/20 0121)       Assessment/Plan   1. Acute on chronic systolic HF -> cardiogenic shock - EF 35-40% in 9/21 -> EF 20% - suspect tachy induced - co-ox 44% on 08/15/20 with lactic acidosis. Milrinone started - R/L cath Monday followed by TEE/DC-CV - continue milrinone and IV lasix  - add spiro and dig  2. Persistent AF with RVR - s/p DC-CV 9/21. No with ERAF - will need TEE/DC-CV - continue IV amio and heparin  - will need sleep study   3. AKI - due to low  output/cardiorenal  - improving with milrinone - follow labs  4. Tobacco use - counseled on need to quit   Length of Stay: 3  Glori Bickers, MD  08/16/2020, 3:58 AM  Advanced Heart Failure Team Pager [phone] (M-F; 7a - 4p)  Please contact [facility] for night-coverage after hours (4p -7a ) and weekends on [email or web address]

## 2020-08-16 NOTE — Progress Notes (Signed)
[place] for heparin Indication: atrial fibrillation  Allergies  Allergen Reactions  . Codeine Nausea And Vomiting  . Lisinopril Other (See Comments)    "Didn't do well in my body"  . Shellfish Allergy Other (See Comments)    Can tolerate shrimp in 2021 (??)    Patient Measurements: Height: 5\' 6"  (167.6 cm) Weight: 92 kg (202 lb 12.8 oz) IBW/kg (Calculated) : 63.8 Heparin Dosing Weight: 83kg  Vital Signs: Temp: 97.9 F (36.6 C) (11/06 0614) Temp Source: Oral (11/06 0614) BP: 123/97 (11/06 0614) Pulse Rate: 98 (11/06 0614)  Labs: Recent Labs    08/13/20 1634 08/13/20 1643 08/14/20 0349 08/14/20 0349 08/15/20 0352 08/15/20 1322 08/15/20 1620 08/16/20 0145  HGB   < >  --  15.0   < > 15.3  --   --  15.3  HCT   < >  --  46.3  --  47.6  --   --  47.2  PLT   < >  --  277  --  255  --   --  279  APTT  --   --   --   --  39*  --  73* 97*  HEPARINUNFRC  --   --   --   --  0.77*  --   --  0.90*  CREATININE   < >  --  1.29*   < > 1.66* 1.74*  --  1.58*  TROPONINIHS  --  23*  --   --   --   --   --   --    < > = values in this interval not displayed.    Estimated Creatinine Clearance: 49.5 mL/min (A) (by C-G formula based on SCr of 1.58 mg/dL (H)).   Assessment: 64 y.o. male with h/o Afib, Eliquis on hold (last dose 11/4 0900), for IV heparin.   Today's aPTT is therapeutic at 97, on 1500 units/hr. Heparin level supratherapeutic at 0.90, will continue to dose based on aPTT until aPTT and heparin level correlate.   Hgb 15.3, plt 279, stable. No s/sx of bleeding or infusion issues reported.  Goal of Therapy:  Heparin level 0.3-0.7 units/ml aPTT 66-102 seconds Monitor platelets by anticoagulation protocol: Yes   Plan:  Continue heparin infusion at 1500 units/hr Daily HL/aPTT, CBC Monitor for s/sx of bleeding Continue to assess for HL and aPTT correlation  Fara Olden, PharmD PGY-1 Pharmacy Resident 08/16/2020 7:28 AM Please see  AMION for all pharmacy numbers

## 2020-08-16 NOTE — Progress Notes (Signed)
CVP = 12

## 2020-08-17 DIAGNOSIS — R570 Cardiogenic shock: Secondary | ICD-10-CM | POA: Diagnosis not present

## 2020-08-17 DIAGNOSIS — I5043 Acute on chronic combined systolic (congestive) and diastolic (congestive) heart failure: Secondary | ICD-10-CM | POA: Diagnosis not present

## 2020-08-17 DIAGNOSIS — I10 Essential (primary) hypertension: Secondary | ICD-10-CM | POA: Diagnosis not present

## 2020-08-17 DIAGNOSIS — I5023 Acute on chronic systolic (congestive) heart failure: Secondary | ICD-10-CM | POA: Diagnosis not present

## 2020-08-17 LAB — BASIC METABOLIC PANEL
Anion gap: 11 (ref 5–15)
BUN: 24 mg/dL — ABNORMAL HIGH (ref 8–23)
CO2: 25 mmol/L (ref 22–32)
Calcium: 8.2 mg/dL — ABNORMAL LOW (ref 8.9–10.3)
Chloride: 103 mmol/L (ref 98–111)
Creatinine, Ser: 1.38 mg/dL — ABNORMAL HIGH (ref 0.61–1.24)
GFR, Estimated: 57 mL/min — ABNORMAL LOW (ref >60)
Glucose, Bld: 189 mg/dL — ABNORMAL HIGH (ref 70–99)
Potassium: 3 mmol/L — ABNORMAL LOW (ref 3.5–5.1)
Sodium: 139 mmol/L (ref 135–145)

## 2020-08-17 LAB — CBC
HCT: 42 % (ref 39.0–52.0)
Hemoglobin: 13.7 g/dL (ref 13.0–17.0)
MCH: 27.4 pg (ref 26.0–34.0)
MCHC: 32.6 g/dL (ref 30.0–36.0)
MCV: 84 fL (ref 80.0–100.0)
Platelets: 241 10*3/uL (ref 150–400)
RBC: 5 MIL/uL (ref 4.22–5.81)
RDW: 14.2 % (ref 11.5–15.5)
WBC: 5.3 10*3/uL (ref 4.0–10.5)
nRBC: 0 % (ref 0.0–0.2)

## 2020-08-17 LAB — HEMOGLOBIN A1C
Hgb A1c MFr Bld: 5.9 % — ABNORMAL HIGH (ref 4.8–5.6)
Mean Plasma Glucose: 122.63 mg/dL

## 2020-08-17 LAB — COOXEMETRY PANEL
Carboxyhemoglobin: 0.8 % (ref 0.5–1.5)
Methemoglobin: 0.9 % (ref 0.0–1.5)
O2 Saturation: 72.4 %
Total hemoglobin: 14.1 g/dL (ref 12.0–16.0)

## 2020-08-17 LAB — HEPARIN LEVEL (UNFRACTIONATED): Heparin Unfractionated: 0.4 IU/mL (ref 0.30–0.70)

## 2020-08-17 LAB — APTT: aPTT: 69 seconds — ABNORMAL HIGH (ref 24–36)

## 2020-08-17 MED ORDER — SACUBITRIL-VALSARTAN 24-26 MG PO TABS
1.0000 | ORAL_TABLET | Freq: Two times a day (BID) | ORAL | Status: DC
  Administered 2020-08-17 – 2020-08-21 (×9): 1 via ORAL
  Filled 2020-08-17 (×9): qty 1

## 2020-08-17 MED ORDER — SODIUM CHLORIDE 0.9% FLUSH
3.0000 mL | INTRAVENOUS | Status: DC | PRN
  Administered 2020-08-17: 3 mL via INTRAVENOUS

## 2020-08-17 MED ORDER — POTASSIUM CHLORIDE CRYS ER 20 MEQ PO TBCR
40.0000 meq | EXTENDED_RELEASE_TABLET | Freq: Once | ORAL | Status: AC
  Administered 2020-08-17: 40 meq via ORAL
  Filled 2020-08-17: qty 2

## 2020-08-17 MED ORDER — SODIUM CHLORIDE 0.9 % IV SOLN: INTRAVENOUS | Status: DC

## 2020-08-17 MED ORDER — FUROSEMIDE 10 MG/ML IJ SOLN: 80.0000 mg | Freq: Two times a day (BID) | INTRAMUSCULAR | Status: DC

## 2020-08-17 MED ORDER — ASPIRIN 81 MG PO CHEW
81.0000 mg | CHEWABLE_TABLET | ORAL | Status: AC
  Administered 2020-08-18: 81 mg via ORAL
  Filled 2020-08-17: qty 1

## 2020-08-17 MED ORDER — FUROSEMIDE 10 MG/ML IJ SOLN
80.0000 mg | Freq: Two times a day (BID) | INTRAMUSCULAR | Status: DC
  Administered 2020-08-17: 80 mg via INTRAVENOUS
  Filled 2020-08-17: qty 8

## 2020-08-17 MED ORDER — SODIUM CHLORIDE 0.9 % IV SOLN: 250.0000 mL | INTRAVENOUS | Status: DC | PRN

## 2020-08-17 MED ORDER — SODIUM CHLORIDE 0.9% FLUSH: 3.0000 mL | Freq: Two times a day (BID) | INTRAVENOUS | Status: DC

## 2020-08-17 NOTE — Progress Notes (Signed)
Christopher Wilkins for heparin Indication: atrial fibrillation  Allergies  Allergen Reactions  . Codeine Nausea And Vomiting  . Lisinopril Other (See Comments)    "Didn't do well in my body"  . Shellfish Allergy Other (See Comments)    Can tolerate shrimp in 2021 (??)    Patient Measurements: Height: 5\' 6"  (167.6 cm) Weight: 90.7 kg (200 lb) IBW/kg (Calculated) : 63.8 Heparin Dosing Weight: 83kg  Vital Signs: Temp: 97.7 F (36.5 C) (11/07 0527) Temp Source: Oral (11/07 0527) BP: 127/94 (11/07 0527) Pulse Rate: 93 (11/07 0527)  Labs: Recent Labs    08/15/20 0352 08/15/20 0352 08/15/20 1322 08/15/20 1620 08/16/20 0145 08/17/20 0535  HGB 15.3   < >  --   --  15.3 13.7  HCT 47.6  --   --   --  47.2 42.0  PLT 255  --   --   --  279 241  APTT 39*   < >  --  73* 97* 69*  HEPARINUNFRC 0.77*  --   --   --  0.90* 0.40  CREATININE 1.66*   < > 1.74*  --  1.58* 1.38*   < > = values in this interval not displayed.    Estimated Creatinine Clearance: 56.3 mL/min (A) (by C-G formula based on SCr of 1.38 mg/dL (H)).   Assessment: 64 y.o. male with h/o Afib, Eliquis on hold (last dose 11/4 0900), for IV heparin possible cath.  Today's aPTT is therapeutic at 69, on 1500 units/hr. Heparin level therapeutic at 0.40, aPTT and heparin level now correlating. Will monitor with heparin level only going forward.  Hgb decreased from 15.3 to 13.7 today, will continue to monitor closely. Plt 241, stable. No s/sx of bleeding reported.  Goal of Therapy:  Heparin level 0.3-0.7 units/ml aPTT 66-102 seconds Monitor platelets by anticoagulation protocol: Yes   Plan:  Continue heparin infusion at 1500 units/hr Daily heparin level, CBC Monitor for s/sx of bleeding  Fara Olden, PharmD PGY-1 Pharmacy Resident 08/17/2020 7:23 AM Please see AMION for all pharmacy numbers

## 2020-08-17 NOTE — Progress Notes (Signed)
Advanced Heart Failure Rounding Note   Subjective:    Remains on milrinone co-ox 72%. Diuresing well on IV lasix. Out over 4L. Weight down another 2 pounds. Creatinine improved. CVP 12  Remains in AF on IV amio and heparin. No bleeding.   Breathing much better. Orthopnea resolved. No CP or DOE.   Objective:   Weight Range:  Vital Signs:   Temp:  [97.7 F (36.5 C)-98.2 F (36.8 C)] 98.2 F (36.8 C) (11/07 0743) Pulse Rate:  [90-98] 98 (11/07 0743) Resp:  [16-22] 18 (11/07 0743) BP: (104-139)/(69-96) 139/89 (11/07 0743) SpO2:  [99 %] 99 % (11/07 0743) Weight:  [90.7 kg] 90.7 kg (11/07 0527) Last BM Date: 08/16/20  Weight change: Filed Weights   08/15/20 0520 08/16/20 0614 08/17/20 0527  Weight: 92.8 kg 92 kg 90.7 kg    Intake/Output:   Intake/Output Summary (Last 24 hours) at 08/17/2020 0927 Last data filed at 08/17/2020 0655 Gross per 24 hour  Intake 1687.82 ml  Output 4020 ml  Net -2332.18 ml     Physical Exam: General:  Lying flat in bed. No resp difficulty HEENT: normal Neck: supple. JVP to jaw  Carotids 2+ bilat; no bruits. No lymphadenopathy or thryomegaly appreciated. Cor: PMI nondisplaced. Irregular rate & rhythm. No rubs, gallops or murmurs. Lungs: clear Abdomen: soft, nontender, nondistended. No hepatosplenomegaly. No bruits or masses. Good bowel sounds. Extremities: no cyanosis, clubbing, rash, edema Neuro: alert & orientedx3, cranial nerves grossly intact. moves all 4 extremities w/o difficulty. Affect pleasant   Telemetry: AF 90-105 Personally reviewed   Labs: Basic Metabolic Panel: Recent Labs  Lab 08/14/20 0349 08/14/20 0349 08/15/20 0352 08/15/20 0352 08/15/20 1322 08/16/20 0145 08/17/20 0535  NA 140  --  142  --  141 139 139  K 3.1*  --  4.3  --  4.3 3.9 3.0*  CL 107  --  109  --  108 106 103  CO2 21*  --  24  --  20* 21* 25  GLUCOSE 95  --  112*  --  130* 190* 189*  BUN 21  --  22  --  23 27* 24*  CREATININE 1.29*  --  1.66*   --  1.74* 1.58* 1.38*  CALCIUM 8.6*   < > 8.8*   < > 8.8* 8.7* 8.2*  MG 1.8  --  2.2  --   --   --   --    < > = values in this interval not displayed.    Liver Function Tests: No results for input(s): AST, ALT, ALKPHOS, BILITOT, PROT, ALBUMIN in the last 168 hours. No results for input(s): LIPASE, AMYLASE in the last 168 hours. No results for input(s): AMMONIA in the last 168 hours.  CBC: Recent Labs  Lab 08/13/20 1634 08/14/20 0349 08/15/20 0352 08/16/20 0145 08/17/20 0535  WBC 6.3 5.0 6.1 6.3 5.3  HGB 16.3 15.0 15.3 15.3 13.7  HCT 50.9 46.3 47.6 47.2 42.0  MCV 87.2 86.4 85.8 86.1 84.0  PLT 288 277 255 279 241    Cardiac Enzymes: No results for input(s): CKTOTAL, CKMB, CKMBINDEX, TROPONINI in the last 168 hours.  BNP: BNP (last 3 results) Recent Labs    06/19/20 0120 08/03/20 1440 08/13/20 1643  BNP 478.8* 411.6* 920.3*    ProBNP (last 3 results) No results for input(s): PROBNP in the last 8760 hours.    Other results:  Imaging: DG CHEST PORT 1 VIEW  Result Date: 08/15/2020 CLINICAL DATA:  PICC line  placement EXAM: PORTABLE CHEST 1 VIEW COMPARISON:  Radiograph 08/13/2020 FINDINGS: Right upper extremity PICC tip terminates at the superior cavoatrial junction. Mid to lower lung opacities suspected to reflect a combination of interstitial edema and atelectasis in the setting of CHF with cardiomegaly. No pneumothorax or visible effusion though the costophrenic sulci are largely collimated from view. Remaining cardiomediastinal contours are unremarkable. No acute osseous or soft tissue abnormality. Telemetry leads overlie the chest. IMPRESSION: 1. Right upper extremity PICC terminates at the superior cavoatrial junction. 2. Increasing opacities in the mid to lower lungs favored to reflect edema and atelectasis though underlying infection is not fully excluded. Electronically Signed   By: Lovena Le M.D.   On: 08/15/2020 21:27   ECHOCARDIOGRAM LIMITED  Result Date:  08/15/2020    ECHOCARDIOGRAM LIMITED REPORT   Patient Name:   Christopher Wilkins Date of Exam: 08/15/2020 Medical Rec #:  [id number]     Height:       66.0 in Accession #:    [id number]    Weight:       204.6 lb Date of Birth:  April 17, 1956     BSA:          2.019 m Patient Age:    64 years      BP:           108/84 mmHg Patient Gender: M             HR:           120 bpm. Exam Location:  Inpatient Procedure: Limited Echo Indications:    I48.91* Unspecified atrial fibrillation  History:        Patient has prior history of Echocardiogram examinations, most                 recent 06/20/2020. CHF, Arrythmias:Atrial Fibrillation; Risk                 Factors:Hypertension.  Sonographer:    Raquel Sarna Senior RDCS Referring Phys: [id number] [place]  1. Difficult apical windows (angle, foreshortened) COmpared to echo from September 2021, LVEF appears to be a little lower . Left ventricular ejection fraction, by estimation, is 25%. The left ventricle demonstrates global hypokinesis.  2. Right ventricular systolic function was not well visualized.  3. Limited echo. FINDINGS  Left Ventricle: Difficult apical windows (angle, foreshortened) COmpared to echo from September 2021, LVEF appears to be a little lower. Left ventricular ejection fraction, by estimation, is 25%%. The left ventricle has severely decreased function. The left ventricle demonstrates global hypokinesis. Right Ventricle: The right ventricular size is not well visualized. Right vetricular wall thickness was not assessed. Right ventricular systolic function was not well visualized. Pericardium: There is no evidence of pericardial effusion. Mitral Valve: The mitral valve is abnormal. There is moderate thickening of the mitral valve leaflet(s). There is mild calcification of the mitral valve leaflet(s). Tricuspid Valve: The tricuspid valve is grossly normal. Aortic Valve: The aortic valve is calcified. Venous: The inferior vena cava is dilated in size with  less than 50% respiratory variability, suggesting right atrial pressure of 15 mmHg.  LV Volumes (MOD) LV vol d, MOD A2C: 141.0 ml LV vol d, MOD A4C: 139.0 ml LV vol s, MOD A2C: 115.0 ml LV vol s, MOD A4C: 105.0 ml LV SV MOD A2C:     26.0 ml LV SV MOD A4C:     139.0 ml LV SV MOD BP:      29.3 ml Dorris Carnes  MD Electronically signed by Dorris Carnes MD Signature Date/Time: 08/15/2020/8:46:39 PM    Final    [place] EKG SITE RITE  Result Date: 08/15/2020 If Site Rite image not attached, placement could not be confirmed due to current cardiac rhythm.     Medications:     Scheduled Medications: . Chlorhexidine Gluconate Cloth  6 each Topical Daily  . digoxin  0.125 mg Oral Daily  . furosemide  80 mg Intravenous BID  . pneumococcal 23 valent vaccine  0.5 mL Intramuscular Tomorrow-1000  . ramelteon  8 mg Oral QHS  . sodium chloride flush  10-40 mL Intracatheter Q12H  . sodium chloride flush  3 mL Intravenous Q12H  . spironolactone  12.5 mg Oral Daily     Infusions: . amiodarone 30 mg/hr (08/17/20 0053)  . heparin 1,500 Units/hr (08/16/20 2247)  . milrinone 0.125 mcg/kg/min (08/16/20 2325)     PRN Medications:  acetaminophen **OR** acetaminophen, sodium chloride flush, white petrolatum  Allergies  Allergen Reactions  . Codeine Nausea And Vomiting  . Lisinopril Other (See Comments)    "Didn't do well in my body"  . Shellfish Allergy Other (See Comments)    Can tolerate shrimp in 2021 (??)     Assessment/Plan:   1. Acute on chronic systolic HF -> cardiogenic shock - EF 35-40% in 9/21 -> EF 20% - suspect tachy induced - co-ox 44% on 08/15/20 with lactic acidosis. Milrinone started - remains on milrinone 0.125 co-ox 72%. Diuresing well on IV lasix. CVP back up to 12 . Will cotninue IV lasix one more day. - R/L cath tomorrow followed by TEE/DC-CV likely on Tuesday - continue milrinone and IV lasix  - continue spiro and dig - add low-dose Entresto   2. Persistent AF with RVR - s/p  DC-CV 9/21. Now with ERAF - will need TEE/DC-CV on Tuesday - continue IV amio and heparin  - will need outpatient sleep study   3. AKI - due to low output/cardiorenal  - improving with milrinone - follow labs  4. Tobacco use - counseled on need to quit - No change  5. Hypokalemia - supp    Length of Stay: 4   Cardarius Senat MD 08/17/2020, 9:27 AM  Advanced Heart Failure Team Pager [phone] (M-F; 7a - 4p)  Please contact [facility] for night-coverage after hours (4p -7a ) and weekends on [email or web address]

## 2020-08-17 NOTE — Progress Notes (Signed)
Progress Note    Christopher Wilkins  WSF:[id number] DOB: 1956/05/17  DOA: 08/13/2020 PCP: Rogers Blocker, MD    Brief Narrative:     Medical records reviewed and are as summarized below:  Christopher Wilkins is an 64 y.o. male with medical history significant of atrial fibrillation, hypertension, CHF (EF 30-35% on 06/20/2020) who presents with worsening dyspnea and lower extremity edema. Patient recently admitted from 10/24-10/26 for heart failure exacerbation believed to be secondary to atrial fibrillation which was not rate controlled. EF this hospitalization was found to be lower at 25%    Assessment/Plan:   Principal Problem:   Acute exacerbation of CHF (congestive heart failure) (HCC) Active Problems:   Essential hypertension   A-fib (HCC)   Cardiogenic shock (HCC)   Acute on chronic systolic CHF (01-74% recently now 20%) with cardiogenic shock -Recent admission for the same thought to be due to poorly controlled A. fib, discharged 10/26 - Progressively worsening symptoms since discharge -IV lasix - Daily weights, strict I/O -down 4L yesterday -cardiology consult:   - R/L cath Monday followed by TEE/DC-CV  - continue milrinone and IV lasix   - add spiro and dig  Atrial fibrillation -amiodarone added 11/4 -eliquis changed to heparin gtt for cath  Hypokalemia/hypomagnesemia -replete  Hyperglycemia - HgbA1c: 5.9   Family Communication/Anticipated D/C date and plan/Code Status   DVT prophylaxis: eliquis held and heparin gtt started for possible cath Code Status: Full Code.  Disposition Plan: Status is: Inpatient  Remains inpatient appropriate because:IV treatments appropriate due to intensity of illness or inability to take PO   Dispo: The patient is from: Home              Anticipated d/c is to: Home              Anticipated d/c date is: 2 days              Patient currently is not medically stable to d/c.  Need for cath, on milrinone         Medical  Consultants:    cards.     Subjective:   No current complaints   Objective:    Vitals:   08/17/20 0002 08/17/20 0527 08/17/20 0743 08/17/20 1018  BP: (!) 122/96 (!) 127/94 139/89   Pulse: 90 93 98 82  Resp: (!) 22 20 18    Temp: 97.8 F (36.6 C) 97.7 F (36.5 C) 98.2 F (36.8 C)   TempSrc: Oral Oral Oral   SpO2: 99%  99%   Weight:  90.7 kg    Height:        Intake/Output Summary (Last 24 hours) at 08/17/2020 1046 Last data filed at 08/17/2020 0655 Gross per 24 hour  Intake 1687.82 ml  Output 4020 ml  Net -2332.18 ml   Filed Weights   08/15/20 0520 08/16/20 0614 08/17/20 0527  Weight: 92.8 kg 92 kg 90.7 kg    Exam:    General: Appearance:    Obese male in no acute distress     Lungs:      respirations unlabored  Heart:    Normal heart rate.irr, No murmurs, rubs, or gallops.   MS:   All extremities are intact.   Neurologic:   Awake, alert, oriented x 3. No apparent focal neurological           defect.      Data Reviewed:   I have personally reviewed following labs and imaging studies:  Labs:  Labs show the following:   Basic Metabolic Panel: Recent Labs  Lab 08/14/20 0349 08/14/20 0349 08/15/20 0352 08/15/20 0352 08/15/20 1322 08/15/20 1322 08/16/20 0145 08/17/20 0535  NA 140  --  142  --  141  --  139 139  K 3.1*   < > 4.3   < > 4.3   < > 3.9 3.0*  CL 107  --  109  --  108  --  106 103  CO2 21*  --  24  --  20*  --  21* 25  GLUCOSE 95  --  112*  --  130*  --  190* 189*  BUN 21  --  22  --  23  --  27* 24*  CREATININE 1.29*  --  1.66*  --  1.74*  --  1.58* 1.38*  CALCIUM 8.6*  --  8.8*  --  8.8*  --  8.7* 8.2*  MG 1.8  --  2.2  --   --   --   --   --    < > = values in this interval not displayed.   GFR Estimated Creatinine Clearance: 56.3 mL/min (A) (by C-G formula based on SCr of 1.38 mg/dL (H)). Liver Function Tests: No results for input(s): AST, ALT, ALKPHOS, BILITOT, PROT, ALBUMIN in the last 168 hours. No results for input(s):  LIPASE, AMYLASE in the last 168 hours. No results for input(s): AMMONIA in the last 168 hours. Coagulation profile No results for input(s): INR, PROTIME in the last 168 hours.  CBC: Recent Labs  Lab 08/13/20 1634 08/14/20 0349 08/15/20 0352 08/16/20 0145 08/17/20 0535  WBC 6.3 5.0 6.1 6.3 5.3  HGB 16.3 15.0 15.3 15.3 13.7  HCT 50.9 46.3 47.6 47.2 42.0  MCV 87.2 86.4 85.8 86.1 84.0  PLT 288 277 255 279 241   Cardiac Enzymes: No results for input(s): CKTOTAL, CKMB, CKMBINDEX, TROPONINI in the last 168 hours. BNP (last 3 results) No results for input(s): PROBNP in the last 8760 hours. CBG: No results for input(s): GLUCAP in the last 168 hours. D-Dimer: No results for input(s): DDIMER in the last 72 hours. Hgb A1c: Recent Labs    08/17/20 0500  HGBA1C 5.9*   Lipid Profile: No results for input(s): CHOL, HDL, LDLCALC, TRIG, CHOLHDL, LDLDIRECT in the last 72 hours. Thyroid function studies: No results for input(s): TSH, T4TOTAL, T3FREE, THYROIDAB in the last 72 hours.  Invalid input(s): FREET3 Anemia work up: No results for input(s): VITAMINB12, FOLATE, FERRITIN, TIBC, IRON, RETICCTPCT in the last 72 hours. Sepsis Labs: Recent Labs  Lab 08/14/20 0349 08/15/20 0352 08/15/20 1620 08/15/20 1915 08/16/20 0145 08/17/20 0535  WBC 5.0 6.1  --   --  6.3 5.3  LATICACIDVEN  --   --  2.5* 2.2* 1.7  --     Microbiology Recent Results (from the past 240 hour(s))  Respiratory Panel by RT PCR (Flu A&B, Covid) - Nasopharyngeal Swab     Status: None   Collection Time: 08/13/20  6:36 PM   Specimen: Nasopharyngeal Swab  Result Value Ref Range Status   SARS Coronavirus 2 by RT PCR NEGATIVE NEGATIVE Final    Comment: (NOTE) SARS-CoV-2 target nucleic acids are NOT DETECTED.  The SARS-CoV-2 RNA is generally detectable in upper respiratoy specimens during the acute phase of infection. The lowest concentration of SARS-CoV-2 viral copies this assay can detect is 131 copies/mL. A  negative result does not preclude SARS-Cov-2 infection and should not be used as the  sole basis for treatment or other patient management decisions. A negative result may occur with  improper specimen collection/handling, submission of specimen other than nasopharyngeal swab, presence of viral mutation(s) within the areas targeted by this assay, and inadequate number of viral copies (<131 copies/mL). A negative result must be combined with clinical observations, patient history, and epidemiological information. The expected result is Negative.  Fact Sheet for Patients:  PinkCheek.be  Fact Sheet for Healthcare Providers:  GravelBags.it  This test is no t yet approved or cleared by the [place] FDA and  has been authorized for detection and/or diagnosis of SARS-CoV-2 by FDA under an Emergency Use Authorization (EUA). This EUA will remain  in effect (meaning this test can be used) for the duration of the COVID-19 declaration under Section 564(b)(1) of the Act, 21 U.S.C. section 360bbb-3(b)(1), unless the authorization is terminated or revoked sooner.     Influenza A by PCR NEGATIVE NEGATIVE Final   Influenza B by PCR NEGATIVE NEGATIVE Final    Comment: (NOTE) The Xpert Xpress SARS-CoV-2/FLU/RSV assay is intended as an aid in  the diagnosis of influenza from Nasopharyngeal swab specimens and  should not be used as a sole basis for treatment. Nasal washings and  aspirates are unacceptable for Xpert Xpress SARS-CoV-2/FLU/RSV  testing.  Fact Sheet for Patients: PinkCheek.be  Fact Sheet for Healthcare Providers: GravelBags.it  This test is not yet approved or cleared by the [place] FDA and  has been authorized for detection and/or diagnosis of SARS-CoV-2 by  FDA under an Emergency Use Authorization (EUA). This EUA will remain  in effect (meaning this test can  be used) for the duration of the  Covid-19 declaration under Section 564(b)(1) of the Act, 21  U.S.C. section 360bbb-3(b)(1), unless the authorization is  terminated or revoked. Performed at [facility], [place] [address] [place], [place] [address]     Procedures and diagnostic studies:  DG CHEST PORT 1 VIEW  Result Date: 08/15/2020 CLINICAL DATA:  PICC line placement EXAM: PORTABLE CHEST 1 VIEW COMPARISON:  Radiograph 08/13/2020 FINDINGS: Right upper extremity PICC tip terminates at the superior cavoatrial junction. Mid to lower lung opacities suspected to reflect a combination of interstitial edema and atelectasis in the setting of CHF with cardiomegaly. No pneumothorax or visible effusion though the costophrenic sulci are largely collimated from view. Remaining cardiomediastinal contours are unremarkable. No acute osseous or soft tissue abnormality. Telemetry leads overlie the chest. IMPRESSION: 1. Right upper extremity PICC terminates at the superior cavoatrial junction. 2. Increasing opacities in the mid to lower lungs favored to reflect edema and atelectasis though underlying infection is not fully excluded. Electronically Signed   By: Lovena Le M.D.   On: 08/15/2020 21:27   ECHOCARDIOGRAM LIMITED  Result Date: 08/15/2020    ECHOCARDIOGRAM LIMITED REPORT   Patient Name:   FERNANDO STOIBER Date of Exam: 08/15/2020 Medical Rec #:  [id number]     Height:       66.0 in Accession #:    [id number]    Weight:       204.6 lb Date of Birth:  June 25, 1956     BSA:          2.019 m Patient Age:    28 years      BP:           108/84 mmHg Patient Gender: M             HR:  120 bpm. Exam Location:  Inpatient Procedure: Limited Echo Indications:    I48.91* Unspecified atrial fibrillation  History:        Patient has prior history of Echocardiogram examinations, most                 recent 06/20/2020. CHF, Arrythmias:Atrial Fibrillation; Risk                 Factors:Hypertension.  Sonographer:     Raquel Sarna Senior RDCS Referring Phys: [id number] [place]  1. Difficult apical windows (angle, foreshortened) COmpared to echo from September 2021, LVEF appears to be a little lower . Left ventricular ejection fraction, by estimation, is 25%. The left ventricle demonstrates global hypokinesis.  2. Right ventricular systolic function was not well visualized.  3. Limited echo. FINDINGS  Left Ventricle: Difficult apical windows (angle, foreshortened) COmpared to echo from September 2021, LVEF appears to be a little lower. Left ventricular ejection fraction, by estimation, is 25%%. The left ventricle has severely decreased function. The left ventricle demonstrates global hypokinesis. Right Ventricle: The right ventricular size is not well visualized. Right vetricular wall thickness was not assessed. Right ventricular systolic function was not well visualized. Pericardium: There is no evidence of pericardial effusion. Mitral Valve: The mitral valve is abnormal. There is moderate thickening of the mitral valve leaflet(s). There is mild calcification of the mitral valve leaflet(s). Tricuspid Valve: The tricuspid valve is grossly normal. Aortic Valve: The aortic valve is calcified. Venous: The inferior vena cava is dilated in size with less than 50% respiratory variability, suggesting right atrial pressure of 15 mmHg.  LV Volumes (MOD) LV vol d, MOD A2C: 141.0 ml LV vol d, MOD A4C: 139.0 ml LV vol s, MOD A2C: 115.0 ml LV vol s, MOD A4C: 105.0 ml LV SV MOD A2C:     26.0 ml LV SV MOD A4C:     139.0 ml LV SV MOD BP:      29.3 ml Dorris Carnes MD Electronically signed by Dorris Carnes MD Signature Date/Time: 08/15/2020/8:46:39 PM    Final    [place] EKG SITE RITE  Result Date: 08/15/2020 If Site Rite image not attached, placement could not be confirmed due to current cardiac rhythm.   Medications:   . Chlorhexidine Gluconate Cloth  6 each Topical Daily  . digoxin  0.125 mg Oral Daily  . pneumococcal 23 valent  vaccine  0.5 mL Intramuscular Tomorrow-1000  . potassium chloride  40 mEq Oral Once  . ramelteon  8 mg Oral QHS  . sacubitril-valsartan  1 tablet Oral BID  . sodium chloride flush  10-40 mL Intracatheter Q12H  . sodium chloride flush  3 mL Intravenous Q12H  . spironolactone  12.5 mg Oral Daily   Continuous Infusions: . amiodarone 30 mg/hr (08/17/20 0053)  . heparin 1,500 Units/hr (08/16/20 2247)  . milrinone 0.125 mcg/kg/min (08/16/20 2325)     LOS: 4 days   [place] Hospitalists   How to contact the [facility] Attending or Consulting provider [place] or covering provider during after hours [place], for this patient?  1. Check the care team in [facility] and look for a) attending/consulting [facility] provider listed and b) the [facility] team listed 2. Log into [email or web address] and use [place]'s universal password to access. If you do not have the password, please contact the hospital operator. 3. Locate the [facility] provider you are looking for under [facility] and page to a number that you can  be directly reached. 4. If you still have difficulty reaching the provider, please page the [facility] (Director on Call) for the Hospitalists listed on amion for assistance.  08/17/2020, 10:46 AM

## 2020-08-18 ENCOUNTER — Encounter (HOSPITAL_COMMUNITY)
Admission: EM | Disposition: A | Discharge status: Home Health | Payer: Self-pay | Source: Home / Self Care | Attending: Internal Medicine

## 2020-08-18 ENCOUNTER — Encounter (HOSPITAL_COMMUNITY): Payer: Self-pay | Admitting: Internal Medicine

## 2020-08-18 DIAGNOSIS — I4819 Other persistent atrial fibrillation: Secondary | ICD-10-CM | POA: Diagnosis not present

## 2020-08-18 DIAGNOSIS — R570 Cardiogenic shock: Secondary | ICD-10-CM | POA: Diagnosis not present

## 2020-08-18 DIAGNOSIS — I5023 Acute on chronic systolic (congestive) heart failure: Secondary | ICD-10-CM | POA: Diagnosis not present

## 2020-08-18 DIAGNOSIS — I5043 Acute on chronic combined systolic (congestive) and diastolic (congestive) heart failure: Secondary | ICD-10-CM | POA: Diagnosis not present

## 2020-08-18 HISTORY — PX: RIGHT/LEFT HEART CATH AND CORONARY ANGIOGRAPHY: CATH118266

## 2020-08-18 LAB — CBC
HCT: 44.8 % (ref 39.0–52.0)
Hemoglobin: 14.7 g/dL (ref 13.0–17.0)
MCH: 27.7 pg (ref 26.0–34.0)
MCHC: 32.8 g/dL (ref 30.0–36.0)
MCV: 84.4 fL (ref 80.0–100.0)
Platelets: 271 10*3/uL (ref 150–400)
RBC: 5.31 MIL/uL (ref 4.22–5.81)
RDW: 14.3 % (ref 11.5–15.5)
WBC: 5.4 10*3/uL (ref 4.0–10.5)
nRBC: 0 % (ref 0.0–0.2)

## 2020-08-18 LAB — POCT I-STAT 7, (LYTES, BLD GAS, ICA,H+H)
Acid-base deficit: 1 mmol/L (ref 0.0–2.0)
Bicarbonate: 23.8 mmol/L (ref 20.0–28.0)
Calcium, Ion: 1.18 mmol/L (ref 1.15–1.40)
HCT: 47 % (ref 39.0–52.0)
Hemoglobin: 16 g/dL (ref 13.0–17.0)
O2 Saturation: 95 %
Potassium: 4.1 mmol/L (ref 3.5–5.1)
Sodium: 145 mmol/L (ref 135–145)
TCO2: 25 mmol/L (ref 22–32)
pCO2 arterial: 39 mmHg (ref 32.0–48.0)
pH, Arterial: 7.393 (ref 7.350–7.450)
pO2, Arterial: 75 mmHg — ABNORMAL LOW (ref 83.0–108.0)

## 2020-08-18 LAB — POCT I-STAT EG7
Acid-Base Excess: 1 mmol/L (ref 0.0–2.0)
Bicarbonate: 26.6 mmol/L (ref 20.0–28.0)
Calcium, Ion: 1.22 mmol/L (ref 1.15–1.40)
HCT: 48 % (ref 39.0–52.0)
Hemoglobin: 16.3 g/dL (ref 13.0–17.0)
O2 Saturation: 72 %
Potassium: 4.2 mmol/L (ref 3.5–5.1)
Sodium: 145 mmol/L (ref 135–145)
TCO2: 28 mmol/L (ref 22–32)
pCO2, Ven: 46.9 mmHg (ref 44.0–60.0)
pH, Ven: 7.362 (ref 7.250–7.430)
pO2, Ven: 40 mmHg (ref 32.0–45.0)

## 2020-08-18 LAB — BASIC METABOLIC PANEL WITH GFR
Anion gap: 11 (ref 5–15)
BUN: 19 mg/dL (ref 8–23)
CO2: 23 mmol/L (ref 22–32)
Calcium: 8.4 mg/dL — ABNORMAL LOW (ref 8.9–10.3)
Chloride: 106 mmol/L (ref 98–111)
Creatinine, Ser: 1.37 mg/dL — ABNORMAL HIGH (ref 0.61–1.24)
GFR, Estimated: 57 mL/min — ABNORMAL LOW
Glucose, Bld: 208 mg/dL — ABNORMAL HIGH (ref 70–99)
Potassium: 3.6 mmol/L (ref 3.5–5.1)
Sodium: 140 mmol/L (ref 135–145)

## 2020-08-18 LAB — HEPARIN LEVEL (UNFRACTIONATED): Heparin Unfractionated: 0.34 IU/mL (ref 0.30–0.70)

## 2020-08-18 LAB — COOXEMETRY PANEL
Carboxyhemoglobin: 0.7 % (ref 0.5–1.5)
Methemoglobin: 0.6 % (ref 0.0–1.5)
O2 Saturation: 75.9 %
Total hemoglobin: 15.2 g/dL (ref 12.0–16.0)

## 2020-08-18 SURGERY — RIGHT/LEFT HEART CATH AND CORONARY ANGIOGRAPHY
Anesthesia: LOCAL

## 2020-08-18 MED ORDER — MIDAZOLAM HCL 2 MG/2ML IJ SOLN
INTRAMUSCULAR | Status: AC
  Filled 2020-08-18: qty 2

## 2020-08-18 MED ORDER — HEPARIN (PORCINE) IN NACL 1000-0.9 UT/500ML-% IV SOLN
INTRAVENOUS | Status: AC
  Filled 2020-08-18: qty 1000

## 2020-08-18 MED ORDER — FENTANYL CITRATE (PF) 100 MCG/2ML IJ SOLN
INTRAMUSCULAR | Status: DC | PRN
  Administered 2020-08-18 (×2): 25 ug via INTRAVENOUS

## 2020-08-18 MED ORDER — HEPARIN (PORCINE) IN NACL 1000-0.9 UT/500ML-% IV SOLN
INTRAVENOUS | Status: DC | PRN
  Administered 2020-08-18 (×2): 500 mL

## 2020-08-18 MED ORDER — SPIRONOLACTONE 25 MG PO TABS
25.0000 mg | ORAL_TABLET | Freq: Every day | ORAL | Status: DC
  Administered 2020-08-18 – 2020-08-21 (×4): 25 mg via ORAL
  Filled 2020-08-18 (×4): qty 1

## 2020-08-18 MED ORDER — LABETALOL HCL 5 MG/ML IV SOLN: 10.0000 mg | INTRAVENOUS | Status: AC | PRN

## 2020-08-18 MED ORDER — SODIUM CHLORIDE 0.9% FLUSH
3.0000 mL | Freq: Two times a day (BID) | INTRAVENOUS | Status: DC
  Administered 2020-08-18 – 2020-08-21 (×6): 3 mL via INTRAVENOUS

## 2020-08-18 MED ORDER — MIDAZOLAM HCL 2 MG/2ML IJ SOLN
INTRAMUSCULAR | Status: DC | PRN
  Administered 2020-08-18 (×2): 1 mg via INTRAVENOUS

## 2020-08-18 MED ORDER — FENTANYL CITRATE (PF) 100 MCG/2ML IJ SOLN
INTRAMUSCULAR | Status: AC
  Filled 2020-08-18: qty 2

## 2020-08-18 MED ORDER — POTASSIUM CHLORIDE CRYS ER 20 MEQ PO TBCR
40.0000 meq | EXTENDED_RELEASE_TABLET | Freq: Once | ORAL | Status: AC
  Administered 2020-08-18: 40 meq via ORAL
  Filled 2020-08-18: qty 2

## 2020-08-18 MED ORDER — ACETAMINOPHEN 325 MG PO TABS: 650.0000 mg | ORAL_TABLET | ORAL | Status: DC | PRN

## 2020-08-18 MED ORDER — VERAPAMIL HCL 2.5 MG/ML IV SOLN
INTRAVENOUS | Status: AC
  Filled 2020-08-18: qty 2

## 2020-08-18 MED ORDER — HEPARIN SODIUM (PORCINE) 1000 UNIT/ML IJ SOLN
INTRAMUSCULAR | Status: AC
  Filled 2020-08-18: qty 1

## 2020-08-18 MED ORDER — HYDRALAZINE HCL 20 MG/ML IJ SOLN: 10.0000 mg | INTRAMUSCULAR | Status: AC | PRN

## 2020-08-18 MED ORDER — SODIUM CHLORIDE 0.9% FLUSH: 3.0000 mL | INTRAVENOUS | Status: DC | PRN

## 2020-08-18 MED ORDER — LIDOCAINE HCL (PF) 1 % IJ SOLN
INTRAMUSCULAR | Status: AC
  Filled 2020-08-18: qty 30

## 2020-08-18 MED ORDER — LIDOCAINE HCL (PF) 1 % IJ SOLN
INTRAMUSCULAR | Status: DC | PRN
  Administered 2020-08-18 (×2): 5 mL
  Administered 2020-08-18: 2 mL

## 2020-08-18 MED ORDER — VERAPAMIL HCL 2.5 MG/ML IV SOLN
INTRAVENOUS | Status: DC | PRN
  Administered 2020-08-18: 10 mL via INTRA_ARTERIAL

## 2020-08-18 MED ORDER — HEPARIN SODIUM (PORCINE) 1000 UNIT/ML IJ SOLN
INTRAMUSCULAR | Status: DC | PRN
  Administered 2020-08-18: 4500 [IU] via INTRAVENOUS

## 2020-08-18 MED ORDER — APIXABAN 5 MG PO TABS
5.0000 mg | ORAL_TABLET | Freq: Two times a day (BID) | ORAL | Status: DC
  Administered 2020-08-18 – 2020-08-21 (×6): 5 mg via ORAL
  Filled 2020-08-18 (×7): qty 1

## 2020-08-18 MED ORDER — ONDANSETRON HCL 4 MG/2ML IJ SOLN: 4.0000 mg | Freq: Four times a day (QID) | INTRAMUSCULAR | Status: DC | PRN

## 2020-08-18 MED ORDER — SODIUM CHLORIDE 0.9 % IV SOLN: 250.0000 mL | INTRAVENOUS | Status: DC | PRN

## 2020-08-18 MED ORDER — IOHEXOL 350 MG/ML SOLN
INTRAVENOUS | Status: DC | PRN
  Administered 2020-08-18: 50 mL

## 2020-08-18 SURGICAL SUPPLY — 12 items
CATH 5FR JL3.5 JR4 ANG PIG MP (CATHETERS) ×1 IMPLANT
CATH SWAN GANZ 7F STRAIGHT (CATHETERS) ×1 IMPLANT
GLIDESHEATH SLEND SS 6F .021 (SHEATH) ×1 IMPLANT
GLIDESHEATH SLENDER 7FR .021G (SHEATH) ×1 IMPLANT
GUIDEWIRE .025 260CM (WIRE) ×1 IMPLANT
GUIDEWIRE INQWIRE 1.5J.035X260 (WIRE) IMPLANT
INQWIRE 1.5J .035X260CM (WIRE) ×2
PACK CARDIAC CATHETERIZATION (CUSTOM PROCEDURE TRAY) ×2 IMPLANT
SHEATH PINNACLE 7F 10CM (SHEATH) ×1 IMPLANT
SHEATH PROBE COVER 6X72 (BAG) ×1 IMPLANT
TRANSDUCER W/STOPCOCK (MISCELLANEOUS) ×2 IMPLANT
WIRE EMERALD 3MM-J .025X260CM (WIRE) ×1 IMPLANT

## 2020-08-18 NOTE — Progress Notes (Signed)
[place] for heparin Indication: atrial fibrillation  Allergies  Allergen Reactions  . Codeine Nausea And Vomiting  . Lisinopril Other (See Comments)    "Didn't do well in my body"  . Shellfish Allergy Other (See Comments)    Can tolerate shrimp in 2021 (??)    Patient Measurements: Height: 5\' 6"  (167.6 cm) Weight: 89.5 kg (197 lb 5 oz) IBW/kg (Calculated) : 63.8 Heparin Dosing Weight: 83kg  Vital Signs: Temp: 97.7 F (36.5 C) (11/08 0354) Temp Source: Oral (11/08 0354) BP: 125/88 (11/08 0354) Pulse Rate: 98 (11/08 0354)  Labs: Recent Labs    08/15/20 1322 08/15/20 1620 08/16/20 0145 08/16/20 0145 08/17/20 0535 08/18/20 0440  HGB  --   --  15.3   < > 13.7 14.7  HCT  --   --  47.2  --  42.0 44.8  PLT  --   --  279  --  241 271  APTT  --  73* 97*  --  69*  --   HEPARINUNFRC  --   --  0.90*  --  0.40 0.34  CREATININE   < >  --  1.58*  --  1.38* 1.37*   < > = values in this interval not displayed.    Estimated Creatinine Clearance: 56.3 mL/min (A) (by C-G formula based on SCr of 1.37 mg/dL (H)).   Assessment: 64 y.o. male with h/o Afib, Eliquis on hold (last dose 11/4 0900), for IV heparin possible cath.  Heparin level remains therapeutic at 0.34, CBC stable, cath planned today.  Goal of Therapy:  Heparin level 0.3-0.7 units/ml Monitor platelets by anticoagulation protocol: Yes   Plan:  -Continue heparin infusion at 1500 units/hr -Daily heparin level, CBC -Monitor for s/sx of bleeding  Arrie Senate, PharmD, BCPS Clinical Pharmacist [phone] Please check AMION for all [place] numbers 08/18/2020

## 2020-08-18 NOTE — Interval H&P Note (Signed)
History and Physical Interval Note:  08/18/2020 8:58 AM  Christopher Wilkins  has presented today for surgery, with the diagnosis of HF.  The various methods of treatment have been discussed with the patient and family. After consideration of risks, benefits and other options for treatment, the patient has consented to  Procedure(s): RIGHT/LEFT HEART CATH AND CORONARY ANGIOGRAPHY (N/A) and possible coronary angioplasty as a surgical intervention.  The patient's history has been reviewed, patient examined, no change in status, stable for surgery.  I have reviewed the patient's chart and labs.  Questions were answered to the patient's satisfaction.     [name]

## 2020-08-18 NOTE — Progress Notes (Addendum)
Advanced Heart Failure Rounding Note   Subjective:   Remains on milirnone 0.125 mcg + amio 30 mg per hour. CO-OX 76%. CVP 5.    Denies SOB. No complaints.     Objective:   Weight Range:  Vital Signs:   Temp:  [97.5 F (36.4 C)-98.4 F (36.9 C)] 97.7 F (36.5 C) (11/08 0354) Pulse Rate:  [82-110] 98 (11/08 0354) Resp:  [17-20] 18 (11/08 0354) BP: (114-139)/(81-100) 125/88 (11/08 0354) SpO2:  [97 %-99 %] 99 % (11/08 0354) Weight:  [89.5 kg] 89.5 kg (11/08 0406) Last BM Date: 08/16/20  Weight change: Filed Weights   08/16/20 0614 08/17/20 0527 08/18/20 0406  Weight: 92 kg 90.7 kg 89.5 kg    Intake/Output:   Intake/Output Summary (Last 24 hours) at 08/18/2020 0740 Last data filed at 08/18/2020 0654 Gross per 24 hour  Intake 1758.26 ml  Output 2700 ml  Net -941.74 ml    CVP 5 personally checked.  Physical Exam: General:  Well appearing. No resp difficulty HEENT: normal Neck: supple. no JVD. Carotids 2+ bilat; no bruits. No lymphadenopathy or thryomegaly appreciated. Cor: PMI nondisplaced. Irregular rate & rhythm. No rubs, gallops or murmurs. Lungs: clear Abdomen: soft, nontender, nondistended. No hepatosplenomegaly. No bruits or masses. Good bowel sounds. Extremities: no cyanosis, clubbing, rash, edema. RUE PICC  Neuro: alert & orientedx3, cranial nerves grossly intact. moves all 4 extremities w/o difficulty. Affect pleasant  Telemetry:  A Fib   Labs: Basic Metabolic Panel: Recent Labs  Lab 08/14/20 0349 08/14/20 0349 08/15/20 0352 08/15/20 0352 08/15/20 1322 08/15/20 1322 08/16/20 0145 08/17/20 0535 08/18/20 0440  NA 140   < > 142  --  141  --  139 139 140  K 3.1*   < > 4.3  --  4.3  --  3.9 3.0* 3.6  CL 107   < > 109  --  108  --  106 103 106  CO2 21*   < > 24  --  20*  --  21* 25 23  GLUCOSE 95   < > 112*  --  130*  --  190* 189* 208*  BUN 21   < > 22  --  23  --  27* 24* 19  CREATININE 1.29*   < > 1.66*  --  1.74*  --  1.58* 1.38* 1.37*   CALCIUM 8.6*   < > 8.8*   < > 8.8*   < > 8.7* 8.2* 8.4*  MG 1.8  --  2.2  --   --   --   --   --   --    < > = values in this interval not displayed.    Liver Function Tests: No results for input(s): AST, ALT, ALKPHOS, BILITOT, PROT, ALBUMIN in the last 168 hours. No results for input(s): LIPASE, AMYLASE in the last 168 hours. No results for input(s): AMMONIA in the last 168 hours.  CBC: Recent Labs  Lab 08/14/20 0349 08/15/20 0352 08/16/20 0145 08/17/20 0535 08/18/20 0440  WBC 5.0 6.1 6.3 5.3 5.4  HGB 15.0 15.3 15.3 13.7 14.7  HCT 46.3 47.6 47.2 42.0 44.8  MCV 86.4 85.8 86.1 84.0 84.4  PLT 277 255 279 241 271    Cardiac Enzymes: No results for input(s): CKTOTAL, CKMB, CKMBINDEX, TROPONINI in the last 168 hours.  BNP: BNP (last 3 results) Recent Labs    06/19/20 0120 08/03/20 1440 08/13/20 1643  BNP 478.8* 411.6* 920.3*    ProBNP (last 3 results)  No results for input(s): PROBNP in the last 8760 hours.    Other results:  Imaging: No results found.   Medications:     Scheduled Medications: . Chlorhexidine Gluconate Cloth  6 each Topical Daily  . digoxin  0.125 mg Oral Daily  . furosemide  80 mg Intravenous BID  . pneumococcal 23 valent vaccine  0.5 mL Intramuscular Tomorrow-1000  . ramelteon  8 mg Oral QHS  . sacubitril-valsartan  1 tablet Oral BID  . sodium chloride flush  10-40 mL Intracatheter Q12H  . sodium chloride flush  3 mL Intravenous Q12H  . sodium chloride flush  3 mL Intravenous Q12H  . spironolactone  12.5 mg Oral Daily    Infusions: . sodium chloride    . sodium chloride 10 mL/hr at 08/18/20 0524  . amiodarone 30 mg/hr (08/17/20 2243)  . heparin 1,500 Units/hr (08/17/20 1900)  . milrinone 0.125 mcg/kg/min (08/18/20 0354)    PRN Medications: sodium chloride, acetaminophen **OR** acetaminophen, sodium chloride flush, sodium chloride flush, white petrolatum  Allergies  Allergen Reactions  . Codeine Nausea And Vomiting  .  Lisinopril Other (See Comments)    "Didn't do well in my body"  . Shellfish Allergy Other (See Comments)    Can tolerate shrimp in 2021 (??)     Assessment/Plan:   1. Acute on chronic systolic HF -> cardiogenic shock - EF 35-40% in 9/21 -> EF 20% - suspect tachy induced - co-ox 44% on 08/15/20 with lactic acidosis. Milrinone started - Stop milrinone. CO-OX 76%. CVP 5 - Stop IV lasix. Can adjust after cath.   R/L cath today.  -  TEE/DC-CV likely on Tuesday -Increase spiro to 25 mg daily.  - Continue spiro and dig - Continue low-dose Entresto   2. Persistent AF with RVR - s/p DC-CV 9/21. Now with ERAF - will need TEE/DC-CV on Tuesday - continue IV amio and heparin  - will need outpatient sleep study   3. AKI - due to low output/cardiorenal  - improving with milrinone - Creatinine stable at 1.4.   4. Tobacco use - counseled on need to quit - No change  5. Hypokalemia - Stable.   Cath today.    Length of Stay: [place] NP-C  08/18/2020, 7:40 AM  Advanced Heart Failure Team Pager [phone] (M-F; 7a - 4p)  Please contact [facility] for night-coverage after hours (4p -7a ) and weekends on [email or web address]  Patient seen and examined with the above-signed Advanced Practice Provider and/or Housestaff. I personally reviewed laboratory data, imaging studies and relevant notes. I independently examined the patient and formulated the important aspects of the plan. I have edited the note to reflect any of my changes or salient points. I have personally discussed the plan with the patient and/or family.  Remains on milrinone and IV amio. Still in AF. Co-ox and CVP improved.   General:  Sitting up in bed. No resp difficulty HEENT: normal Neck: supple. no JVD. Carotids 2+ bilat; no bruits. No lymphadenopathy or thryomegaly appreciated. Cor: PMI nondisplaced. Irregular rate & rhythm. No rubs, gallops or murmurs. Lungs: clear Abdomen: soft, nontender, nondistended. No  hepatosplenomegaly. No bruits or masses. Good bowel sounds. Extremities: no cyanosis, clubbing, rash, edema Neuro: alert & orientedx3, cranial nerves grossly intact. moves all 4 extremities w/o difficulty. Affect pleasant  Will plan R/L cath today to exclude CAD then will need TEE/DC-CV either Tuesday or Wednesday. Suspect will start Eliquis after cath today and plan TEE-DC-CV on Wednesday after  3 doses.   Glori Bickers, MD  8:52 AM

## 2020-08-18 NOTE — Progress Notes (Addendum)
Progress Note    Christopher Wilkins  OJJ:[id number] DOB: [birth year]  DOA: 08/13/2020 PCP: Rogers Blocker, MD    Brief Narrative:     Medical records reviewed and are as summarized below:  Christopher Wilkins is an 64 y.o. male with medical history significant of atrial fibrillation, hypertension, CHF (EF 30-35% on 06/20/2020) who presents with worsening dyspnea and lower extremity edema. Patient recently admitted from 10/24-10/26 for heart failure exacerbation believed to be secondary to atrial fibrillation which was not rate controlled. EF this hospitalization was found to be lower at 25%.  CHF team consulted and work up includes heart cath and TEE/cardioversion.      Assessment/Plan:   Principal Problem:   Acute exacerbation of CHF (congestive heart failure) (HCC) Active Problems:   Essential hypertension   A-fib (HCC)   Cardiogenic shock (HCC)   Acute on chronic systolic CHF (93-71% recently now 20%) with cardiogenic shock -Recent admission for the same thought to be due to poorly controlled A. fib, discharged 10/26 - Progressively worsening symptoms since discharge -IV lasix - Daily weights, strict I/O -down 6L -cardiology consult:   - R/L cath 11/8 followed by TEE/DC-CV possibly on 11/9  - continue milrinone and IV lasix   - spiro and dig  Atrial fibrillation -amiodarone added 11/4 -eliquis changed to heparin gtt for cath  Hypokalemia/hypomagnesemia -replete  Hyperglycemia - HgbA1c: 5.9   Family Communication/Anticipated D/C date and plan/Code Status   DVT prophylaxis: eliquis held and heparin gtt started for possible cath Code Status: Full Code.  Disposition Plan: Status is: Inpatient  Remains inpatient appropriate because:IV treatments appropriate due to intensity of illness or inability to take PO   Dispo: The patient is from: Home              Anticipated d/c is to: Home              Anticipated d/c date is: 2 days              Patient currently is not  medically stable to d/c.  Need for cath, on milrinone         Medical Consultants:    cards.     Subjective:   Worried about his car parked at the urgent care: nurse to notify security   Objective:    Vitals:   08/18/20 0354 08/18/20 0406 08/18/20 0822 08/18/20 0831  BP: 125/88  (!) 129/93   Pulse: 98  97 95  Resp: 18  18   Temp: 97.7 F (36.5 C)  97.6 F (36.4 C)   TempSrc: Oral  Oral   SpO2: 99%     Weight:  89.5 kg    Height:        Intake/Output Summary (Last 24 hours) at 08/18/2020 0834 Last data filed at 08/18/2020 6967 Gross per 24 hour  Intake 1758.26 ml  Output 2700 ml  Net -941.74 ml   Filed Weights   08/16/20 0614 08/17/20 0527 08/18/20 0406  Weight: 92 kg 90.7 kg 89.5 kg    Exam:   General: Appearance:    Obese male in no acute distress     Lungs:      respirations unlabored  Heart:    Normal heart rate. irr No murmurs, rubs, or gallops.   MS:   All extremities are intact.   Neurologic:   Awake, alert, oriented x 3. No apparent focal neurological           defect.  Data Reviewed:   I have personally reviewed following labs and imaging studies:  Labs: Labs show the following:   Basic Metabolic Panel: Recent Labs  Lab 08/14/20 0349 08/14/20 0349 08/15/20 0352 08/15/20 0352 08/15/20 1322 08/15/20 1322 08/16/20 0145 08/16/20 0145 08/17/20 0535 08/18/20 0440  NA 140   < > 142  --  141  --  139  --  139 140  K 3.1*   < > 4.3   < > 4.3   < > 3.9   < > 3.0* 3.6  CL 107   < > 109  --  108  --  106  --  103 106  CO2 21*   < > 24  --  20*  --  21*  --  25 23  GLUCOSE 95   < > 112*  --  130*  --  190*  --  189* 208*  BUN 21   < > 22  --  23  --  27*  --  24* 19  CREATININE 1.29*   < > 1.66*  --  1.74*  --  1.58*  --  1.38* 1.37*  CALCIUM 8.6*   < > 8.8*  --  8.8*  --  8.7*  --  8.2* 8.4*  MG 1.8  --  2.2  --   --   --   --   --   --   --    < > = values in this interval not displayed.   GFR Estimated Creatinine  Clearance: 56.3 mL/min (A) (by C-G formula based on SCr of 1.37 mg/dL (H)). Liver Function Tests: No results for input(s): AST, ALT, ALKPHOS, BILITOT, PROT, ALBUMIN in the last 168 hours. No results for input(s): LIPASE, AMYLASE in the last 168 hours. No results for input(s): AMMONIA in the last 168 hours. Coagulation profile No results for input(s): INR, PROTIME in the last 168 hours.  CBC: Recent Labs  Lab 08/14/20 0349 08/15/20 0352 08/16/20 0145 08/17/20 0535 08/18/20 0440  WBC 5.0 6.1 6.3 5.3 5.4  HGB 15.0 15.3 15.3 13.7 14.7  HCT 46.3 47.6 47.2 42.0 44.8  MCV 86.4 85.8 86.1 84.0 84.4  PLT 277 255 279 241 271   Cardiac Enzymes: No results for input(s): CKTOTAL, CKMB, CKMBINDEX, TROPONINI in the last 168 hours. BNP (last 3 results) No results for input(s): PROBNP in the last 8760 hours. CBG: No results for input(s): GLUCAP in the last 168 hours. D-Dimer: No results for input(s): DDIMER in the last 72 hours. Hgb A1c: Recent Labs    08/17/20 0500  HGBA1C 5.9*   Lipid Profile: No results for input(s): CHOL, HDL, LDLCALC, TRIG, CHOLHDL, LDLDIRECT in the last 72 hours. Thyroid function studies: No results for input(s): TSH, T4TOTAL, T3FREE, THYROIDAB in the last 72 hours.  Invalid input(s): FREET3 Anemia work up: No results for input(s): VITAMINB12, FOLATE, FERRITIN, TIBC, IRON, RETICCTPCT in the last 72 hours. Sepsis Labs: Recent Labs  Lab 08/15/20 0352 08/15/20 1620 08/15/20 1915 08/16/20 0145 08/17/20 0535 08/18/20 0440  WBC 6.1  --   --  6.3 5.3 5.4  LATICACIDVEN  --  2.5* 2.2* 1.7  --   --     Microbiology Recent Results (from the past 240 hour(s))  Respiratory Panel by RT PCR (Flu A&B, Covid) - Nasopharyngeal Swab     Status: None   Collection Time: 08/13/20  6:36 PM   Specimen: Nasopharyngeal Swab  Result Value Ref Range Status   SARS Coronavirus 2  by RT PCR NEGATIVE NEGATIVE Final    Comment: (NOTE) SARS-CoV-2 target nucleic acids are NOT  DETECTED.  The SARS-CoV-2 RNA is generally detectable in upper respiratoy specimens during the acute phase of infection. The lowest concentration of SARS-CoV-2 viral copies this assay can detect is 131 copies/mL. A negative result does not preclude SARS-Cov-2 infection and should not be used as the sole basis for treatment or other patient management decisions. A negative result may occur with  improper specimen collection/handling, submission of specimen other than nasopharyngeal swab, presence of viral mutation(s) within the areas targeted by this assay, and inadequate number of viral copies (<131 copies/mL). A negative result must be combined with clinical observations, patient history, and epidemiological information. The expected result is Negative.  Fact Sheet for Patients:  PinkCheek.be  Fact Sheet for Healthcare Providers:  GravelBags.it  This test is no t yet approved or cleared by the [place] FDA and  has been authorized for detection and/or diagnosis of SARS-CoV-2 by FDA under an Emergency Use Authorization (EUA). This EUA will remain  in effect (meaning this test can be used) for the duration of the COVID-19 declaration under Section 564(b)(1) of the Act, 21 U.S.C. section 360bbb-3(b)(1), unless the authorization is terminated or revoked sooner.     Influenza A by PCR NEGATIVE NEGATIVE Final   Influenza B by PCR NEGATIVE NEGATIVE Final    Comment: (NOTE) The Xpert Xpress SARS-CoV-2/FLU/RSV assay is intended as an aid in  the diagnosis of influenza from Nasopharyngeal swab specimens and  should not be used as a sole basis for treatment. Nasal washings and  aspirates are unacceptable for Xpert Xpress SARS-CoV-2/FLU/RSV  testing.  Fact Sheet for Patients: PinkCheek.be  Fact Sheet for Healthcare Providers: GravelBags.it  This test is not yet  approved or cleared by the [place] FDA and  has been authorized for detection and/or diagnosis of SARS-CoV-2 by  FDA under an Emergency Use Authorization (EUA). This EUA will remain  in effect (meaning this test can be used) for the duration of the  Covid-19 declaration under Section 564(b)(1) of the Act, 21  U.S.C. section 360bbb-3(b)(1), unless the authorization is  terminated or revoked. Performed at [facility], [place] [address] [place], [place] [address]     Procedures and diagnostic studies:  No results found.  Medications:   . Chlorhexidine Gluconate Cloth  6 each Topical Daily  . digoxin  0.125 mg Oral Daily  . pneumococcal 23 valent vaccine  0.5 mL Intramuscular Tomorrow-1000  . ramelteon  8 mg Oral QHS  . sacubitril-valsartan  1 tablet Oral BID  . sodium chloride flush  10-40 mL Intracatheter Q12H  . sodium chloride flush  3 mL Intravenous Q12H  . sodium chloride flush  3 mL Intravenous Q12H  . spironolactone  25 mg Oral Daily   Continuous Infusions: . sodium chloride    . sodium chloride 10 mL/hr at 08/18/20 0524  . amiodarone 30 mg/hr (08/17/20 2243)  . heparin 1,500 Units/hr (08/17/20 1900)     LOS: 5 days   Geradine Girt  [facility]   How to contact the [facility] Attending or Consulting provider [place] or covering provider during after hours [place], for this patient?  1. Check the care team in [facility] and look for a) attending/consulting [facility] provider listed and b) the [facility] team listed 2. Log into [email or web address] and use [place]'s universal password to access. If you do not have the password, please contact  the hospital operator. 3. Locate the [facility] provider you are looking for under [facility] and page to a number that you can be directly reached. 4. If you still have difficulty reaching the provider, please page the [facility] (Director on Call) for the Hospitalists listed on amion for assistance.  08/18/2020, 8:34 AM

## 2020-08-18 NOTE — H&P (View-Only) (Signed)
Advanced Heart Failure Rounding Note   Subjective:   Remains on milirnone 0.125 mcg + amio 30 mg per hour. CO-OX 76%. CVP 5.    Denies SOB. No complaints.     Objective:   Weight Range:  Vital Signs:   Temp:  [97.5 F (36.4 C)-98.4 F (36.9 C)] 97.7 F (36.5 C) (11/08 0354) Pulse Rate:  [82-110] 98 (11/08 0354) Resp:  [17-20] 18 (11/08 0354) BP: (114-139)/(81-100) 125/88 (11/08 0354) SpO2:  [97 %-99 %] 99 % (11/08 0354) Weight:  [89.5 kg] 89.5 kg (11/08 0406) Last BM Date: 08/16/20  Weight change: Filed Weights   08/16/20 0614 08/17/20 0527 08/18/20 0406  Weight: 92 kg 90.7 kg 89.5 kg    Intake/Output:   Intake/Output Summary (Last 24 hours) at 08/18/2020 0740 Last data filed at 08/18/2020 0654 Gross per 24 hour  Intake 1758.26 ml  Output 2700 ml  Net -941.74 ml    CVP 5 personally checked.  Physical Exam: General:  Well appearing. No resp difficulty HEENT: normal Neck: supple. no JVD. Carotids 2+ bilat; no bruits. No lymphadenopathy or thryomegaly appreciated. Cor: PMI nondisplaced. Irregular rate & rhythm. No rubs, gallops or murmurs. Lungs: clear Abdomen: soft, nontender, nondistended. No hepatosplenomegaly. No bruits or masses. Good bowel sounds. Extremities: no cyanosis, clubbing, rash, edema. RUE PICC  Neuro: alert & orientedx3, cranial nerves grossly intact. moves all 4 extremities w/o difficulty. Affect pleasant  Telemetry:  A Fib   Labs: Basic Metabolic Panel: Recent Labs  Lab 08/14/20 0349 08/14/20 0349 08/15/20 0352 08/15/20 0352 08/15/20 1322 08/15/20 1322 08/16/20 0145 08/17/20 0535 08/18/20 0440  NA 140   < > 142  --  141  --  139 139 140  K 3.1*   < > 4.3  --  4.3  --  3.9 3.0* 3.6  CL 107   < > 109  --  108  --  106 103 106  CO2 21*   < > 24  --  20*  --  21* 25 23  GLUCOSE 95   < > 112*  --  130*  --  190* 189* 208*  BUN 21   < > 22  --  23  --  27* 24* 19  CREATININE 1.29*   < > 1.66*  --  1.74*  --  1.58* 1.38* 1.37*   CALCIUM 8.6*   < > 8.8*   < > 8.8*   < > 8.7* 8.2* 8.4*  MG 1.8  --  2.2  --   --   --   --   --   --    < > = values in this interval not displayed.    Liver Function Tests: No results for input(s): AST, ALT, ALKPHOS, BILITOT, PROT, ALBUMIN in the last 168 hours. No results for input(s): LIPASE, AMYLASE in the last 168 hours. No results for input(s): AMMONIA in the last 168 hours.  CBC: Recent Labs  Lab 08/14/20 0349 08/15/20 0352 08/16/20 0145 08/17/20 0535 08/18/20 0440  WBC 5.0 6.1 6.3 5.3 5.4  HGB 15.0 15.3 15.3 13.7 14.7  HCT 46.3 47.6 47.2 42.0 44.8  MCV 86.4 85.8 86.1 84.0 84.4  PLT 277 255 279 241 271    Cardiac Enzymes: No results for input(s): CKTOTAL, CKMB, CKMBINDEX, TROPONINI in the last 168 hours.  BNP: BNP (last 3 results) Recent Labs    06/19/20 0120 08/03/20 1440 08/13/20 1643  BNP 478.8* 411.6* 920.3*    ProBNP (last 3 results)  No results for input(s): PROBNP in the last 8760 hours.    Other results:  Imaging: No results found.   Medications:     Scheduled Medications: . Chlorhexidine Gluconate Cloth  6 each Topical Daily  . digoxin  0.125 mg Oral Daily  . furosemide  80 mg Intravenous BID  . pneumococcal 23 valent vaccine  0.5 mL Intramuscular Tomorrow-1000  . ramelteon  8 mg Oral QHS  . sacubitril-valsartan  1 tablet Oral BID  . sodium chloride flush  10-40 mL Intracatheter Q12H  . sodium chloride flush  3 mL Intravenous Q12H  . sodium chloride flush  3 mL Intravenous Q12H  . spironolactone  12.5 mg Oral Daily    Infusions: . sodium chloride    . sodium chloride 10 mL/hr at 08/18/20 0524  . amiodarone 30 mg/hr (08/17/20 2243)  . heparin 1,500 Units/hr (08/17/20 1900)  . milrinone 0.125 mcg/kg/min (08/18/20 0354)    PRN Medications: sodium chloride, acetaminophen **OR** acetaminophen, sodium chloride flush, sodium chloride flush, white petrolatum  Allergies  Allergen Reactions  . Codeine Nausea And Vomiting  .  Lisinopril Other (See Comments)    "Didn't do well in my body"  . Shellfish Allergy Other (See Comments)    Can tolerate shrimp in 2021 (??)     Assessment/Plan:   1. Acute on chronic systolic HF -> cardiogenic shock - EF 35-40% in 9/21 -> EF 20% - suspect tachy induced - co-ox 44% on 08/15/20 with lactic acidosis. Milrinone started - Stop milrinone. CO-OX 76%. CVP 5 - Stop IV lasix. Can adjust after cath.   R/L cath today.  -  TEE/DC-CV likely on Tuesday -Increase spiro to 25 mg daily.  - Continue spiro and dig - Continue low-dose Entresto   2. Persistent AF with RVR - s/p DC-CV 9/21. Now with ERAF - will need TEE/DC-CV on Tuesday - continue IV amio and heparin  - will need outpatient sleep study   3. AKI - due to low output/cardiorenal  - improving with milrinone - Creatinine stable at 1.4.   4. Tobacco use - counseled on need to quit - No change  5. Hypokalemia - Stable.   Cath today.    Length of Stay: [place] NP-C  08/18/2020, 7:40 AM  Advanced Heart Failure Team Pager [phone] (M-F; 7a - 4p)  Please contact [facility] for night-coverage after hours (4p -7a ) and weekends on [email or web address]  Patient seen and examined with the above-signed Advanced Practice Provider and/or Housestaff. I personally reviewed laboratory data, imaging studies and relevant notes. I independently examined the patient and formulated the important aspects of the plan. I have edited the note to reflect any of my changes or salient points. I have personally discussed the plan with the patient and/or family.  Remains on milrinone and IV amio. Still in AF. Co-ox and CVP improved.   General:  Sitting up in bed. No resp difficulty HEENT: normal Neck: supple. no JVD. Carotids 2+ bilat; no bruits. No lymphadenopathy or thryomegaly appreciated. Cor: PMI nondisplaced. Irregular rate & rhythm. No rubs, gallops or murmurs. Lungs: clear Abdomen: soft, nontender, nondistended. No  hepatosplenomegaly. No bruits or masses. Good bowel sounds. Extremities: no cyanosis, clubbing, rash, edema Neuro: alert & orientedx3, cranial nerves grossly intact. moves all 4 extremities w/o difficulty. Affect pleasant  Will plan R/L cath today to exclude CAD then will need TEE/DC-CV either Tuesday or Wednesday. Suspect will start Eliquis after cath today and plan TEE-DC-CV on Wednesday after  3 doses.   Glori Bickers, MD  8:52 AM

## 2020-08-19 ENCOUNTER — Ambulatory Visit: Payer: Medicare HMO | Admitting: Cardiology

## 2020-08-19 DIAGNOSIS — I5043 Acute on chronic combined systolic (congestive) and diastolic (congestive) heart failure: Secondary | ICD-10-CM | POA: Diagnosis not present

## 2020-08-19 DIAGNOSIS — I5023 Acute on chronic systolic (congestive) heart failure: Secondary | ICD-10-CM | POA: Diagnosis not present

## 2020-08-19 DIAGNOSIS — I10 Essential (primary) hypertension: Secondary | ICD-10-CM | POA: Diagnosis not present

## 2020-08-19 DIAGNOSIS — R570 Cardiogenic shock: Secondary | ICD-10-CM | POA: Diagnosis not present

## 2020-08-19 LAB — CBC
HCT: 48 % (ref 39.0–52.0)
Hemoglobin: 15.4 g/dL (ref 13.0–17.0)
MCH: 27.2 pg (ref 26.0–34.0)
MCHC: 32.1 g/dL (ref 30.0–36.0)
MCV: 84.8 fL (ref 80.0–100.0)
Platelets: 270 10*3/uL (ref 150–400)
RBC: 5.66 MIL/uL (ref 4.22–5.81)
RDW: 14.4 % (ref 11.5–15.5)
WBC: 5.4 10*3/uL (ref 4.0–10.5)
nRBC: 0 % (ref 0.0–0.2)

## 2020-08-19 LAB — COOXEMETRY PANEL
Carboxyhemoglobin: 0.6 % (ref 0.5–1.5)
Carboxyhemoglobin: 0.6 % (ref 0.5–1.5)
Methemoglobin: 0.7 % (ref 0.0–1.5)
Methemoglobin: 0.7 % (ref 0.0–1.5)
O2 Saturation: 58.4 %
O2 Saturation: 57.2 %
Total hemoglobin: 16 g/dL (ref 12.0–16.0)
Total hemoglobin: 16 g/dL (ref 12.0–16.0)

## 2020-08-19 LAB — POCT I-STAT EG7
Acid-base deficit: 1 mmol/L (ref 0.0–2.0)
Bicarbonate: 25.2 mmol/L (ref 20.0–28.0)
Calcium, Ion: 1.17 mmol/L (ref 1.15–1.40)
HCT: 47 % (ref 39.0–52.0)
Hemoglobin: 16 g/dL (ref 13.0–17.0)
O2 Saturation: 71 %
Potassium: 4.1 mmol/L (ref 3.5–5.1)
Sodium: 145 mmol/L (ref 135–145)
TCO2: 27 mmol/L (ref 22–32)
pCO2, Ven: 45.5 mmHg (ref 44.0–60.0)
pH, Ven: 7.352 (ref 7.250–7.430)
pO2, Ven: 39 mmHg (ref 32.0–45.0)

## 2020-08-19 LAB — BASIC METABOLIC PANEL
Anion gap: 9 (ref 5–15)
BUN: 16 mg/dL (ref 8–23)
CO2: 23 mmol/L (ref 22–32)
Calcium: 8.9 mg/dL (ref 8.9–10.3)
Chloride: 108 mmol/L (ref 98–111)
Creatinine, Ser: 1.37 mg/dL — ABNORMAL HIGH (ref 0.61–1.24)
GFR, Estimated: 57 mL/min — ABNORMAL LOW (ref >60)
Glucose, Bld: 96 mg/dL (ref 70–99)
Potassium: 4.2 mmol/L (ref 3.5–5.1)
Sodium: 140 mmol/L (ref 135–145)

## 2020-08-19 MED ORDER — FUROSEMIDE 40 MG PO TABS
40.0000 mg | ORAL_TABLET | Freq: Every day | ORAL | Status: DC
  Administered 2020-08-19: 40 mg via ORAL
  Filled 2020-08-19 (×2): qty 1

## 2020-08-19 NOTE — Progress Notes (Signed)
CARDIAC REHAB PHASE I   PRE:  Rate/Rhythm: 83 afib    BP: sitting 104/82    SaO2: 98 RA  MODE:  Ambulation: 470 ft   POST:  Rate/Rhythm: 108 afib    BP: sitting 119/108     SaO2: 100 RA  Pt able to walk unassisted. HR stable. No c/o. Pt has HF booklet, reminded him to read it. Gave low sodium diets, HF and afib videos to view. Will f/u tomorrow. [place], ACSM 08/19/2020 2:48 PM

## 2020-08-19 NOTE — Progress Notes (Signed)
Progress Note    Christopher Wilkins  HEN:[id number] DOB: 1956-06-19  DOA: 08/13/2020 PCP: Rogers Blocker, MD    Brief Narrative:     Medical records reviewed and are as summarized below:  Christopher Wilkins is an 64 y.o. male with medical history significant of atrial fibrillation, hypertension, CHF (EF 30-35% on 06/20/2020) who presents with worsening dyspnea and lower extremity edema. Patient recently admitted from 10/24-10/26 for heart failure exacerbation believed to be secondary to atrial fibrillation which was not rate controlled. EF this hospitalization was found to be lower at 25%.  CHF team consulted and work up includes heart cath and TEE/cardioversion.      Assessment/Plan:   Principal Problem:   Acute exacerbation of CHF (congestive heart failure) (HCC) Active Problems:   Essential hypertension   A-fib (HCC)   Cardiogenic shock (HCC)   Acute on chronic systolic CHF (36-14% recently now 20%) with cardiogenic shock -Recent admission for the same thought to be due to poorly controlled A. fib, discharged 10/26 - Progressively worsening symptoms since discharge - lasix change to PO - Daily weights, strict I/O -down 7L -cardiology consult:   - R/L cath 11/8 followed by TEE/DC-CV possibly on 11/9  - milrinone d/c'd and IV lasix   - spiro and dig  Atrial fibrillation -amiodarone added 11/4 -eliquis changed to heparin gtt for cath and now back to eliquis  Hypokalemia/hypomagnesemia -replete  Hyperglycemia - HgbA1c: 5.9   Family Communication/Anticipated D/C date and plan/Code Status   DVT prophylaxis: eliquis  Code Status: Full Code.  Disposition Plan: Status is: Inpatient  Remains inpatient appropriate because:IV treatments appropriate due to intensity of illness or inability to take PO   Dispo: The patient is from: Home              Anticipated d/c is to: Home              Anticipated d/c date is: 2 days              Patient currently is not medically stable  to d/c.  TEE planned for 11/10         Medical Consultants:    cards.     Subjective:   Feeling better overall- breathing better-- hopes to be able to stay out of the hospital   Objective:    Vitals:   08/19/20 0654 08/19/20 0700 08/19/20 0818 08/19/20 1158  BP: (!) 120/108 (!) 122/109 (!) 119/92 (!) 133/104  Pulse: (!) 105  (!) 105 (!) 113  Resp: 16  20 20   Temp:  97.8 F (36.6 C) 97.7 F (36.5 C) 97.7 F (36.5 C)  TempSrc:  Oral Oral Oral  SpO2:   98% 99%  Weight: 90.4 kg     Height:        Intake/Output Summary (Last 24 hours) at 08/19/2020 1247 Last data filed at 08/19/2020 4315 Gross per 24 hour  Intake 289.92 ml  Output 1325 ml  Net -1035.08 ml   Filed Weights   08/17/20 0527 08/18/20 0406 08/19/20 0654  Weight: 90.7 kg 89.5 kg 90.4 kg    Exam:   General: Appearance:    Obese male in no acute distress, sitting on side of bed     Lungs:     respirations unlabored, no wheezing  Heart:    Tachycardic.No murmurs, rubs, or gallops.   MS:   All extremities are intact.   Neurologic:   Awake, alert, oriented x 3. No apparent  focal neurological           defect.       Data Reviewed:   I have personally reviewed following labs and imaging studies:  Labs: Labs show the following:   Basic Metabolic Panel: Recent Labs  Lab 08/14/20 0349 08/14/20 0349 08/15/20 0352 08/15/20 0352 08/15/20 1322 08/15/20 1322 08/16/20 0145 08/16/20 0145 08/17/20 0535 08/17/20 0535 08/18/20 0440 08/18/20 0440 08/18/20 1138 08/18/20 1138 08/18/20 1150 08/19/20 0445  NA 140   < > 142   < > 141   < > 139   < > 139  --  140  --  145  --  145 140  K 3.1*   < > 4.3   < > 4.3   < > 3.9   < > 3.0*   < > 3.6   < > 4.1   < > 4.2 4.2  CL 107   < > 109   < > 108  --  106  --  103  --  106  --   --   --   --  108  CO2 21*   < > 24   < > 20*  --  21*  --  25  --  23  --   --   --   --  23  GLUCOSE 95   < > 112*   < > 130*  --  190*  --  189*  --  208*  --   --   --    --  96  BUN 21   < > 22   < > 23  --  27*  --  24*  --  19  --   --   --   --  16  CREATININE 1.29*   < > 1.66*   < > 1.74*  --  1.58*  --  1.38*  --  1.37*  --   --   --   --  1.37*  CALCIUM 8.6*   < > 8.8*   < > 8.8*  --  8.7*  --  8.2*  --  8.4*  --   --   --   --  8.9  MG 1.8  --  2.2  --   --   --   --   --   --   --   --   --   --   --   --   --    < > = values in this interval not displayed.   GFR Estimated Creatinine Clearance: 56.6 mL/min (A) (by C-G formula based on SCr of 1.37 mg/dL (H)). Liver Function Tests: No results for input(s): AST, ALT, ALKPHOS, BILITOT, PROT, ALBUMIN in the last 168 hours. No results for input(s): LIPASE, AMYLASE in the last 168 hours. No results for input(s): AMMONIA in the last 168 hours. Coagulation profile No results for input(s): INR, PROTIME in the last 168 hours.  CBC: Recent Labs  Lab 08/15/20 0352 08/15/20 0352 08/16/20 0145 08/16/20 0145 08/17/20 0535 08/18/20 0440 08/18/20 1138 08/18/20 1150 08/19/20 0445  WBC 6.1  --  6.3  --  5.3 5.4  --   --  5.4  HGB 15.3   < > 15.3   < > 13.7 14.7 16.0 16.3 15.4  HCT 47.6   < > 47.2   < > 42.0 44.8 47.0 48.0 48.0  MCV 85.8  --  86.1  --  84.0 84.4  --   --  84.8  PLT 255  --  279  --  241 271  --   --  270   < > = values in this interval not displayed.   Cardiac Enzymes: No results for input(s): CKTOTAL, CKMB, CKMBINDEX, TROPONINI in the last 168 hours. BNP (last 3 results) No results for input(s): PROBNP in the last 8760 hours. CBG: No results for input(s): GLUCAP in the last 168 hours. D-Dimer: No results for input(s): DDIMER in the last 72 hours. Hgb A1c: Recent Labs    08/17/20 0500  HGBA1C 5.9*   Lipid Profile: No results for input(s): CHOL, HDL, LDLCALC, TRIG, CHOLHDL, LDLDIRECT in the last 72 hours. Thyroid function studies: No results for input(s): TSH, T4TOTAL, T3FREE, THYROIDAB in the last 72 hours.  Invalid input(s): FREET3 Anemia work up: No results for input(s):  VITAMINB12, FOLATE, FERRITIN, TIBC, IRON, RETICCTPCT in the last 72 hours. Sepsis Labs: Recent Labs  Lab 08/15/20 0352 08/15/20 1620 08/15/20 1915 08/16/20 0145 08/17/20 0535 08/18/20 0440 08/19/20 0445  WBC   < >  --   --  6.3 5.3 5.4 5.4  LATICACIDVEN  --  2.5* 2.2* 1.7  --   --   --    < > = values in this interval not displayed.    Microbiology Recent Results (from the past 240 hour(s))  Respiratory Panel by RT PCR (Flu A&B, Covid) - Nasopharyngeal Swab     Status: None   Collection Time: 08/13/20  6:36 PM   Specimen: Nasopharyngeal Swab  Result Value Ref Range Status   SARS Coronavirus 2 by RT PCR NEGATIVE NEGATIVE Final    Comment: (NOTE) SARS-CoV-2 target nucleic acids are NOT DETECTED.  The SARS-CoV-2 RNA is generally detectable in upper respiratoy specimens during the acute phase of infection. The lowest concentration of SARS-CoV-2 viral copies this assay can detect is 131 copies/mL. A negative result does not preclude SARS-Cov-2 infection and should not be used as the sole basis for treatment or other patient management decisions. A negative result may occur with  improper specimen collection/handling, submission of specimen other than nasopharyngeal swab, presence of viral mutation(s) within the areas targeted by this assay, and inadequate number of viral copies (<131 copies/mL). A negative result must be combined with clinical observations, patient history, and epidemiological information. The expected result is Negative.  Fact Sheet for Patients:  PinkCheek.be  Fact Sheet for Healthcare Providers:  GravelBags.it  This test is no t yet approved or cleared by the [place] FDA and  has been authorized for detection and/or diagnosis of SARS-CoV-2 by FDA under an Emergency Use Authorization (EUA). This EUA will remain  in effect (meaning this test can be used) for the duration of the COVID-19  declaration under Section 564(b)(1) of the Act, 21 U.S.C. section 360bbb-3(b)(1), unless the authorization is terminated or revoked sooner.     Influenza A by PCR NEGATIVE NEGATIVE Final   Influenza B by PCR NEGATIVE NEGATIVE Final    Comment: (NOTE) The Xpert Xpress SARS-CoV-2/FLU/RSV assay is intended as an aid in  the diagnosis of influenza from Nasopharyngeal swab specimens and  should not be used as a sole basis for treatment. Nasal washings and  aspirates are unacceptable for Xpert Xpress SARS-CoV-2/FLU/RSV  testing.  Fact Sheet for Patients: PinkCheek.be  Fact Sheet for Healthcare Providers: GravelBags.it  This test is not yet approved or cleared by the [place] FDA and  has been authorized for detection and/or diagnosis of SARS-CoV-2 by  FDA under an  Emergency Use Authorization (EUA). This EUA will remain  in effect (meaning this test can be used) for the duration of the  Covid-19 declaration under Section 564(b)(1) of the Act, 21  U.S.C. section 360bbb-3(b)(1), unless the authorization is  terminated or revoked. Performed at [facility], [place] [address] [place], [place] [address]     Procedures and diagnostic studies:  CARDIAC CATHETERIZATION  Result Date: 08/18/2020 Findings: On milrinone 0.125 mcg/kg/min Ao = 94/63 (76) LV = 101/18 RA = 9 RV = 38/11 PA = 37/18 (24) PCW =16 Fick cardiac output/index = 5.5/2.8 PVR = 1.5 WU FA sat = 95% PA sat = 71%, 72% Assessment: 1. Normal coronary arteries 2. Severe NICM EF 15-20% (suspect AF related) 3. Well compensated hemodynamics on milrinone 4. 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 Switch to apixaban later today. TEE/DC-CV on Wednesday am. Glori Bickers, MD 11:07 AM   Medications:   . apixaban  5 mg Oral BID  . Chlorhexidine Gluconate Cloth  6 each Topical Daily  . digoxin  0.125 mg Oral Daily  . furosemide  40 mg Oral Daily  .  pneumococcal 23 valent vaccine  0.5 mL Intramuscular Tomorrow-1000  . ramelteon  8 mg Oral QHS  . sacubitril-valsartan  1 tablet Oral BID  . sodium chloride flush  10-40 mL Intracatheter Q12H  . sodium chloride flush  3 mL Intravenous Q12H  . sodium chloride flush  3 mL Intravenous Q12H  . spironolactone  25 mg Oral Daily   Continuous Infusions: . sodium chloride    . amiodarone 30 mg/hr (08/19/20 1226)     LOS: 6 days   [place] [facility]   How to contact the [facility] Attending or Consulting provider [place] or covering provider during after hours [place], for this patient?  1. Check the care team in [facility] and look for a) attending/consulting TRH provider listed and b) the [facility] team listed 2. Log into [email or web address] and use [place]'s universal password to access. If you do not have the password, please contact the hospital operator. 3. Locate the [facility] provider you are looking for under [facility] and page to a number that you can be directly reached. 4. If you still have difficulty reaching the provider, please page the [facility] (Director on Call) for the Hospitalists listed on amion for assistance.  08/19/2020, 12:47 PM

## 2020-08-19 NOTE — Progress Notes (Addendum)
Advanced Heart Failure Rounding Note   Subjective:   Yesterday milrinone was stopped. Remains on amio 30 mg per hour.   Denies SOB. Denies chest pain.   On milrinone 0.125 mcg/kg/min Ao = 94/63 (76) LV = 101/18 RA = 9 RV = 38/11 PA = 37/18 (24) PCW =16 Fick cardiac output/index = 5.5/2.8 PVR = 1.5 WU FA sat = 95% PA sat = 71%, 72%   Objective:   Weight Range:  Vital Signs:   Temp:  [97.4 F (36.3 C)-97.9 F (36.6 C)] 97.8 F (36.6 C) (11/09 0700) Pulse Rate:  [46-135] 105 (11/09 0654) Resp:  [0-27] 16 (11/09 0654) BP: (100-141)/(69-112) 122/109 (11/09 0700) SpO2:  [96 %-100 %] 97 % (11/08 2334) Weight:  [90.4 kg] 90.4 kg (11/09 0654) Last BM Date: 08/16/20  Weight change: Filed Weights   08/17/20 0527 08/18/20 0406 08/19/20 0654  Weight: 90.7 kg 89.5 kg 90.4 kg    Intake/Output:   Intake/Output Summary (Last 24 hours) at 08/19/2020 0701 Last data filed at 08/19/2020 0500 Gross per 24 hour  Intake 289.92 ml  Output 1225 ml  Net -935.08 ml    CVP  Physical Exam: General:  Well appearing. No resp difficulty HEENT: normal Neck: supple. no JVD. Carotids 2+ bilat; no bruits. No lymphadenopathy or thryomegaly appreciated. Cor: PMI nondisplaced. Irregular rate & rhythm. No rubs, gallops or murmurs. Lungs: clear Abdomen: soft, nontender, nondistended. No hepatosplenomegaly. No bruits or masses. Good bowel sounds. Extremities: no cyanosis, clubbing, rash, edema Neuro: alert & orientedx3, cranial nerves grossly intact. moves all 4 extremities w/o difficulty. Affect pleasant   Telemetry:  A fib 90-100s   Labs: Basic Metabolic Panel: Recent Labs  Lab 08/14/20 0349 08/14/20 0349 08/15/20 0352 08/15/20 0352 08/15/20 1322 08/15/20 1322 08/16/20 0145 08/16/20 0145 08/17/20 0535 08/18/20 0440 08/18/20 1138 08/18/20 1150 08/19/20 0445  NA 140   < > 142   < > 141   < > 139   < > 139 140 145 145 140  K 3.1*   < > 4.3   < > 4.3   < > 3.9   < > 3.0*  3.6 4.1 4.2 4.2  CL 107   < > 109   < > 108  --  106  --  103 106  --   --  108  CO2 21*   < > 24   < > 20*  --  21*  --  25 23  --   --  23  GLUCOSE 95   < > 112*   < > 130*  --  190*  --  189* 208*  --   --  96  BUN 21   < > 22   < > 23  --  27*  --  24* 19  --   --  16  CREATININE 1.29*   < > 1.66*   < > 1.74*  --  1.58*  --  1.38* 1.37*  --   --  1.37*  CALCIUM 8.6*   < > 8.8*   < > 8.8*   < > 8.7*   < > 8.2* 8.4*  --   --  8.9  MG 1.8  --  2.2  --   --   --   --   --   --   --   --   --   --    < > = values in this interval not displayed.  Liver Function Tests: No results for input(s): AST, ALT, ALKPHOS, BILITOT, PROT, ALBUMIN in the last 168 hours. No results for input(s): LIPASE, AMYLASE in the last 168 hours. No results for input(s): AMMONIA in the last 168 hours.  CBC: Recent Labs  Lab 08/15/20 0352 08/15/20 0352 08/16/20 0145 08/16/20 0145 08/17/20 0535 08/18/20 0440 08/18/20 1138 08/18/20 1150 08/19/20 0445  WBC 6.1  --  6.3  --  5.3 5.4  --   --  5.4  HGB 15.3   < > 15.3   < > 13.7 14.7 16.0 16.3 15.4  HCT 47.6   < > 47.2   < > 42.0 44.8 47.0 48.0 48.0  MCV 85.8  --  86.1  --  84.0 84.4  --   --  84.8  PLT 255  --  279  --  241 271  --   --  270   < > = values in this interval not displayed.    Cardiac Enzymes: No results for input(s): CKTOTAL, CKMB, CKMBINDEX, TROPONINI in the last 168 hours.  BNP: BNP (last 3 results) Recent Labs    06/19/20 0120 08/03/20 1440 08/13/20 1643  BNP 478.8* 411.6* 920.3*    ProBNP (last 3 results) No results for input(s): PROBNP in the last 8760 hours.    Other results:  Imaging: CARDIAC CATHETERIZATION  Result Date: 08/18/2020 Findings: On milrinone 0.125 mcg/kg/min Ao = 94/63 (76) LV = 101/18 RA = 9 RV = 38/11 PA = 37/18 (24) PCW =16 Fick cardiac output/index = 5.5/2.8 PVR = 1.5 WU FA sat = 95% PA sat = 71%, 72% Assessment: 1. Normal coronary arteries 2. Severe NICM EF 15-20% (suspect AF related) 3. Well  compensated hemodynamics on milrinone 4. 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 Switch to apixaban later today. TEE/DC-CV on Wednesday am. Glori Bickers, MD 11:07 AM    Medications:     Scheduled Medications: . apixaban  5 mg Oral BID  . Chlorhexidine Gluconate Cloth  6 each Topical Daily  . digoxin  0.125 mg Oral Daily  . pneumococcal 23 valent vaccine  0.5 mL Intramuscular Tomorrow-1000  . ramelteon  8 mg Oral QHS  . sacubitril-valsartan  1 tablet Oral BID  . sodium chloride flush  10-40 mL Intracatheter Q12H  . sodium chloride flush  3 mL Intravenous Q12H  . sodium chloride flush  3 mL Intravenous Q12H  . spironolactone  25 mg Oral Daily    Infusions: . sodium chloride    . amiodarone 30 mg/hr (08/18/20 2330)    PRN Medications: sodium chloride, acetaminophen, ondansetron (ZOFRAN) IV, sodium chloride flush, sodium chloride flush, white petrolatum  Allergies  Allergen Reactions  . Codeine Nausea And Vomiting  . Lisinopril Other (See Comments)    "Didn't do well in my body"  . Shellfish Allergy Other (See Comments)    Can tolerate shrimp in 2021 (??)     Assessment/Plan:   1. Acute on chronic systolic HF -> cardiogenic shock - EF 35-40% in 9/21 -> EF 20% - suspect tachy induced - co-ox 44% on 08/15/20 with lactic acidosis. Milrinone started - Cath with normal cors.  - Off milrinone. CO-OX 57%. CVP 7 - Start lasix 40 mg po dailyl -IContinue spiro to 25 mg daily.  - Continue dig - Continue low-dose Entresto   2. Persistent AF with RVR - s/p DC-CV 9/21. Now with ERAF - will need TEE/DC-CV on Wed.  - continue IV amio and eliquis.   - will  need outpatient sleep study   3. AKI - due to low output/cardiorenal  - improving with milrinone - Creatinine stable at 1.37   4. Tobacco use - counseled on need to quit - No change  5. Hypokalemia - Stable.   TEE/DC-CV tomorrow. Check CO-OX at 1200 if lower will add back milrinone.     Length of Stay: [place] NP-C  08/19/2020, 7:01 AM  Advanced Heart Failure Team Pager [phone] (M-F; 7a - 4p)  Please contact [facility] for night-coverage after hours (4p -7a ) and weekends on [email or web address]  Patient seen and examined with the above-signed Advanced Practice Provider and/or Housestaff. I personally reviewed laboratory data, imaging studies and relevant notes. I independently examined the patient and formulated the important aspects of the plan. I have edited the note to reflect any of my changes or salient points. I have personally discussed the plan with the patient and/or family.  Results of cath from yesterday reviewed with him. Milrinone turned off last night. Co-ox 58% this am. CVp 6 (checked personally). Feels ok. Remains in AF with RVR. On IV amio. Eliquis started last night.   General:  Sitting up on side of bed  No resp difficulty HEENT: normal Neck: supple. no JVD. Carotids 2+ bilat; no bruits. No lymphadenopathy or thryomegaly appreciated. Cor: PMI nondisplaced. Irregular rate & rhythm. No rubs, gallops or murmurs. Lungs: clear Abdomen: soft, nontender, nondistended. No hepatosplenomegaly. No bruits or masses. Good bowel sounds. Extremities: no cyanosis, clubbing, rash, edema Neuro: alert & orientedx3, cranial nerves grossly intact. moves all 4 extremities w/o difficulty. Affect pleasant  Will load Eliquis today. Plan TEE/DC-CV tomorrow. Continue amio. Will recheck co-ox later today. If falling off milrinone will restart milrinone to help support DC-CV. Start oral diuretics today.   Glori Bickers, MD  7:14 AM

## 2020-08-19 NOTE — Progress Notes (Signed)
   Plan for TEE/DC/CV tomorrow at 1400.   Liliahna Cudd Np-C - 8:09 AM

## 2020-08-19 NOTE — TOC Initial Note (Signed)
Transition of Care [facility]) - Initial/Assessment Note    Patient Details  Name: Christopher Wilkins MRN: [id number] Date of Birth: [birth year]  Transition of [facility]) CM/SW Contact:    Bethena Roys, RN Phone Number: 08/19/2020, 4:56 PM  Clinical Narrative: Patient presented for dyspnea and edema. Patient states he drove himself and that his car is parked outside of the Urgent [place] was aware that the car was parked outside and approved not to tow. Patient was currently active with [facility] for home health registered nurse- resumption orders placed in Epic and [facility] is aware that the patient was hospitalized. Case Manager will continue to follow for additional transition of care needs.   Expected Discharge Plan: [place] Barriers to Discharge: No Barriers Identified   Patient Goals and CMS Choice Patient states their goals for this hospitalization and ongoing recovery are:: to return home with home health services.   Choice offered to / list presented to : NA (Currently active with [facility].)  Expected Discharge Plan and Services Expected Discharge Plan: [place] In-house Referral: NA Discharge Planning Services: CM Consult Post Acute Care Choice: Home Health, Resumption of Svcs/PTA Provider Living arrangements for the past 2 months: Single Family Home                 DME Arranged: N/A DME Agency: NA       HH Arranged: RN, Disease Management [place] Agency: [place] Date [place]: 08/19/20 Time HH Agency Contacted: [id number] Representative spoke with at [place]: Tommi Rumps  Prior Living Arrangements/Services Living arrangements for the past 2 months: [place] with:: Self Patient language and need for interpreter reviewed:: Yes Do you feel safe going back to the place where you live?: Yes      Need for Family Participation in Patient Care: No (Comment) Care giver support system  in place?: No (comment) Current home services: Home RN (active with [facility].) Criminal Activity/Legal Involvement Pertinent to Current Situation/Hospitalization: No - Comment as needed  Activities of Daily Living Home Assistive Devices/Equipment: None ADL Screening (condition at time of admission) Patient's cognitive ability adequate to safely complete daily activities?: Yes Is the patient deaf or have difficulty hearing?: No Does the patient have difficulty seeing, even when wearing glasses/contacts?: No Does the patient have difficulty concentrating, remembering, or making decisions?: No Patient able to express need for assistance with ADLs?: Yes Does the patient have difficulty dressing or bathing?: No Independently performs ADLs?: Yes (appropriate for developmental age) Does the patient have difficulty walking or climbing stairs?: Yes Weakness of Legs: Both Weakness of Arms/Hands: None  Permission Sought/Granted Permission sought to share information with : Facility Sport and exercise psychologist, Case Optician, dispensing granted to share information with : Yes, Verbal Permission Granted     Permission granted to share info w AGENCY: [place]        Emotional Assessment Appearance:: Appears stated age Attitude/Demeanor/Rapport: Gracious Affect (typically observed): Appropriate Orientation: : Oriented to Self, Oriented to Place, Oriented to  Time, Oriented to Situation Alcohol / Substance Use: Not Applicable Psych Involvement: No (comment)  Admission diagnosis:  Acute exacerbation of CHF (congestive heart failure) (HCC) [I50.9] Congestive heart failure, unspecified HF chronicity, unspecified heart failure type ([place]) [I50.9] Patient Active Problem List   Diagnosis Date Noted  . Cardiogenic shock ([place])   . Acute exacerbation of CHF (congestive heart failure) ([place]) 08/13/2020  . A-fib ([place]) 08/03/2020  . CHF (congestive heart  failure) ([place]) 08/03/2020  . Acute CHF ([place]) 07/02/2020  .  Cardiomyopathy ([place]) 07/02/2020  . Anticoagulated 07/02/2020  . Essential hypertension 07/02/2020  . Atrial fibrillation with RVR ([place]) 06/18/2020  . Angioedema 04/19/2019  . AKI (acute kidney injury) ([place])    PCP:  Rogers Blocker, MD Pharmacy:   [facility], [address] [place] [address] Phone: [phone] Fax: [phone]   Readmission Risk Interventions No flowsheet data found.

## 2020-08-19 NOTE — Care Management Important Message (Signed)
Important Message  Patient Details  Name: Christopher Wilkins MRN: [id number] Date of Birth: [birth year]   Medicare Important Message Given:  Yes     Shelda Altes 08/19/2020, 11:05 AM

## 2020-08-19 NOTE — Progress Notes (Signed)
Received patient AAO x4, with IV amiodarone and milrinone infusing via R PICC line. IV milrinone discontinued as per MD order.  Patient resting  with O2 2l/[place] in use, denies any discomforts. NPO after Midnight instructed for TEE in AM.

## 2020-08-20 ENCOUNTER — Encounter (HOSPITAL_COMMUNITY): Payer: Self-pay | Admitting: Certified Registered"

## 2020-08-20 ENCOUNTER — Inpatient Hospital Stay (HOSPITAL_COMMUNITY): Payer: Medicare HMO | Admitting: Anesthesiology

## 2020-08-20 ENCOUNTER — Encounter (HOSPITAL_COMMUNITY): Payer: Self-pay | Admitting: Internal Medicine

## 2020-08-20 ENCOUNTER — Encounter (HOSPITAL_COMMUNITY)
Admission: EM | Disposition: A | Discharge status: Home Health | Payer: Self-pay | Source: Home / Self Care | Attending: Internal Medicine

## 2020-08-20 ENCOUNTER — Inpatient Hospital Stay (HOSPITAL_COMMUNITY): Payer: Medicare HMO

## 2020-08-20 DIAGNOSIS — R570 Cardiogenic shock: Secondary | ICD-10-CM | POA: Diagnosis not present

## 2020-08-20 DIAGNOSIS — I4819 Other persistent atrial fibrillation: Secondary | ICD-10-CM | POA: Diagnosis not present

## 2020-08-20 DIAGNOSIS — I5043 Acute on chronic combined systolic (congestive) and diastolic (congestive) heart failure: Secondary | ICD-10-CM | POA: Diagnosis not present

## 2020-08-20 HISTORY — PX: CARDIOVERSION: SHX1299

## 2020-08-20 HISTORY — PX: TEE WITHOUT CARDIOVERSION: SHX5443

## 2020-08-20 LAB — COOXEMETRY PANEL
Carboxyhemoglobin: 0.7 % (ref 0.5–1.5)
Methemoglobin: 0.7 % (ref 0.0–1.5)
O2 Saturation: 58.9 %
Total hemoglobin: 16.6 g/dL — ABNORMAL HIGH (ref 12.0–16.0)

## 2020-08-20 LAB — BASIC METABOLIC PANEL
Anion gap: 8 (ref 5–15)
BUN: 18 mg/dL (ref 8–23)
CO2: 23 mmol/L (ref 22–32)
Calcium: 8.7 mg/dL — ABNORMAL LOW (ref 8.9–10.3)
Chloride: 110 mmol/L (ref 98–111)
Creatinine, Ser: 1.35 mg/dL — ABNORMAL HIGH (ref 0.61–1.24)
GFR, Estimated: 58 mL/min — ABNORMAL LOW (ref >60)
Glucose, Bld: 89 mg/dL (ref 70–99)
Potassium: 4.1 mmol/L (ref 3.5–5.1)
Sodium: 141 mmol/L (ref 135–145)

## 2020-08-20 LAB — CBC
HCT: 48.1 % (ref 39.0–52.0)
Hemoglobin: 15.7 g/dL (ref 13.0–17.0)
MCH: 27.9 pg (ref 26.0–34.0)
MCHC: 32.6 g/dL (ref 30.0–36.0)
MCV: 85.6 fL (ref 80.0–100.0)
Platelets: 270 10*3/uL (ref 150–400)
RBC: 5.62 MIL/uL (ref 4.22–5.81)
RDW: 14.4 % (ref 11.5–15.5)
WBC: 4.6 10*3/uL (ref 4.0–10.5)
nRBC: 0 % (ref 0.0–0.2)

## 2020-08-20 LAB — PROTIME-INR
INR: 1.2 (ref 0.8–1.2)
Prothrombin Time: 14.8 seconds (ref 11.4–15.2)

## 2020-08-20 SURGERY — ECHOCARDIOGRAM, TRANSESOPHAGEAL
Anesthesia: Monitor Anesthesia Care

## 2020-08-20 SURGERY — ECHOCARDIOGRAM, TRANSESOPHAGEAL
Anesthesia: General

## 2020-08-20 MED ORDER — PROPOFOL 500 MG/50ML IV EMUL
INTRAVENOUS | Status: DC | PRN
  Administered 2020-08-20: 100 ug/kg/min via INTRAVENOUS

## 2020-08-20 MED ORDER — LIDOCAINE VISCOUS HCL 2 % MT SOLN
OROMUCOSAL | Status: AC
  Filled 2020-08-20: qty 15

## 2020-08-20 MED ORDER — PHENYLEPHRINE 40 MCG/ML (10ML) SYRINGE FOR IV PUSH (FOR BLOOD PRESSURE SUPPORT)
PREFILLED_SYRINGE | INTRAVENOUS | Status: DC | PRN
  Administered 2020-08-20: 120 ug via INTRAVENOUS

## 2020-08-20 MED ORDER — SODIUM CHLORIDE 0.9 % IV SOLN: INTRAVENOUS | Status: DC

## 2020-08-20 MED ORDER — FUROSEMIDE 80 MG PO TABS
80.0000 mg | ORAL_TABLET | Freq: Every day | ORAL | Status: DC
  Administered 2020-08-20 – 2020-08-21 (×2): 80 mg via ORAL
  Filled 2020-08-20 (×2): qty 1

## 2020-08-20 NOTE — Progress Notes (Addendum)
Advanced Heart Failure Rounding Note   Subjective:   Remains off milrinone. CO-OX pending. Earlier CO-OX rejected.   On amio 30 mg per hour.   Wants to go home. Denies SOB.      On milrinone 0.125 mcg/kg/min Ao = 94/63 (76) LV = 101/18 RA = 9 RV = 38/11 PA = 37/18 (24) PCW =16 Fick cardiac output/index = 5.5/2.8 PVR = 1.5 WU FA sat = 95% PA sat = 71%, 72%   Objective:   Weight Range:  Vital Signs:   Temp:  [97.1 F (36.2 C)-97.7 F (36.5 C)] 97.1 F (36.2 C) (11/10 0634) Pulse Rate:  [92-113] 95 (11/10 0634) Resp:  [20-22] 20 (11/10 0038) BP: (119-133)/(92-105) 122/105 (11/10 0634) SpO2:  [98 %-99 %] 98 % (11/10 0634) Weight:  [90.8 kg] 90.8 kg (11/10 0641) Last BM Date: 08/19/20  Weight change: Filed Weights   08/18/20 0406 08/19/20 0654 08/20/20 0641  Weight: 89.5 kg 90.4 kg 90.8 kg    Intake/Output:   Intake/Output Summary (Last 24 hours) at 08/20/2020 0749 Last data filed at 08/20/2020 0600 Gross per 24 hour  Intake 809.69 ml  Output 1525 ml  Net -715.31 ml    CVP 9 Physical Exam: General:  Sitting on the side of the bed. No resp difficulty HEENT: normal Neck: supple. JVP 8-9 . Carotids 2+ bilat; no bruits. No lymphadenopathy or thryomegaly appreciated. Cor: PMI nondisplaced. Irregular rate & rhythm. No rubs, gallops or murmurs. Lungs: clear Abdomen: soft, nontender, nondistended. No hepatosplenomegaly. No bruits or masses. Good bowel sounds. Extremities: no cyanosis, clubbing, rash, edema. RUE PICC  Neuro: alert & orientedx3, cranial nerves grossly intact. moves all 4 extremities w/o difficulty. Affect pleasant Telemetry: A fib 90-100s personally reviewed.   Labs: Basic Metabolic Panel: Recent Labs  Lab 08/14/20 0349 08/14/20 0349 08/15/20 0352 08/15/20 1322 08/16/20 0145 08/16/20 0145 08/17/20 0535 08/17/20 0535 08/18/20 0440 08/18/20 0440 08/18/20 1138 08/18/20 1149 08/18/20 1150 08/19/20 0445 08/20/20 0622  NA 140    < > 142   < > 139   < > 139   < > 140   < > 145 145 145 140 141  K 3.1*   < > 4.3   < > 3.9   < > 3.0*   < > 3.6   < > 4.1 4.1 4.2 4.2 4.1  CL 107   < > 109   < > 106  --  103  --  106  --   --   --   --  108 110  CO2 21*   < > 24   < > 21*  --  25  --  23  --   --   --   --  23 23  GLUCOSE 95   < > 112*   < > 190*  --  189*  --  208*  --   --   --   --  96 89  BUN 21   < > 22   < > 27*  --  24*  --  19  --   --   --   --  16 18  CREATININE 1.29*   < > 1.66*   < > 1.58*  --  1.38*  --  1.37*  --   --   --   --  1.37* 1.35*  CALCIUM 8.6*   < > 8.8*   < > 8.7*   < > 8.2*   < >  8.4*  --   --   --   --  8.9 8.7*  MG 1.8  --  2.2  --   --   --   --   --   --   --   --   --   --   --   --    < > = values in this interval not displayed.    Liver Function Tests: No results for input(s): AST, ALT, ALKPHOS, BILITOT, PROT, ALBUMIN in the last 168 hours. No results for input(s): LIPASE, AMYLASE in the last 168 hours. No results for input(s): AMMONIA in the last 168 hours.  CBC: Recent Labs  Lab 08/16/20 0145 08/16/20 0145 08/17/20 0535 08/17/20 0535 08/18/20 0440 08/18/20 0440 08/18/20 1138 08/18/20 1149 08/18/20 1150 08/19/20 0445 08/20/20 0622  WBC 6.3  --  5.3  --  5.4  --   --   --   --  5.4 4.6  HGB 15.3   < > 13.7   < > 14.7   < > 16.0 16.0 16.3 15.4 15.7  HCT 47.2   < > 42.0   < > 44.8   < > 47.0 47.0 48.0 48.0 48.1  MCV 86.1  --  84.0  --  84.4  --   --   --   --  84.8 85.6  PLT 279  --  241  --  271  --   --   --   --  270 270   < > = values in this interval not displayed.    Cardiac Enzymes: No results for input(s): CKTOTAL, CKMB, CKMBINDEX, TROPONINI in the last 168 hours.  BNP: BNP (last 3 results) Recent Labs    06/19/20 0120 08/03/20 1440 08/13/20 1643  BNP 478.8* 411.6* 920.3*    ProBNP (last 3 results) No results for input(s): PROBNP in the last 8760 hours.    Other results:  Imaging: CARDIAC CATHETERIZATION  Result Date: 08/18/2020 Findings: On  milrinone 0.125 mcg/kg/min Ao = 94/63 (76) LV = 101/18 RA = 9 RV = 38/11 PA = 37/18 (24) PCW =16 Fick cardiac output/index = 5.5/2.8 PVR = 1.5 WU FA sat = 95% PA sat = 71%, 72% Assessment: 1. Normal coronary arteries 2. Severe NICM EF 15-20% (suspect AF related) 3. Well compensated hemodynamics on milrinone 4. 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 Switch to apixaban later today. TEE/DC-CV on Wednesday am. Glori Bickers, MD 11:07 AM    Medications:     Scheduled Medications: . apixaban  5 mg Oral BID  . Chlorhexidine Gluconate Cloth  6 each Topical Daily  . digoxin  0.125 mg Oral Daily  . furosemide  40 mg Oral Daily  . pneumococcal 23 valent vaccine  0.5 mL Intramuscular Tomorrow-1000  . ramelteon  8 mg Oral QHS  . sacubitril-valsartan  1 tablet Oral BID  . sodium chloride flush  10-40 mL Intracatheter Q12H  . sodium chloride flush  3 mL Intravenous Q12H  . sodium chloride flush  3 mL Intravenous Q12H  . spironolactone  25 mg Oral Daily    Infusions: . sodium chloride    . amiodarone 30 mg/hr (08/19/20 2203)    PRN Medications: sodium chloride, acetaminophen, ondansetron (ZOFRAN) IV, sodium chloride flush, sodium chloride flush, white petrolatum  Allergies  Allergen Reactions  . Codeine Nausea And Vomiting  . Lisinopril Other (See Comments)    "Didn't do well in my body"  . Shellfish Allergy Other (See Comments)  Can tolerate shrimp in 2021 (??)     Assessment/Plan:   1. Acute on chronic systolic HF -> cardiogenic shock - EF 35-40% in 9/21 -> EF 20% - suspect tachy induced - co-ox 44% on 08/15/20 with lactic acidosis. Milrinone started - Cath with normal cors.  - Off milrinone. CO-OX pending.  - Volume status trending up. Increase lasix to 80 mg po daily.  -Continue spiro to 25 mg daily.  - Continue dig - Continue low-dose Entresto  - Hold off on SGT2i . Start soon.  - Renal function stable.   2. Persistent AF with RVR - s/p DC-CV 9/21.  Now with ERAF -TEE/DC_CV today .  - continue IV amio and eliquis.   - will need outpatient sleep study   3. AKI - due to low output/cardiorenal  - improving with milrinone - Creatinine stable at 1.35  4. Tobacco use - counseled on need to quit - No change  5. Hypokalemia - Stable.   TEE/DC-CV today. NPO    Length of Stay: 7   Amy Clegg NP-C  08/20/2020, 7:49 AM  Advanced Heart Failure Team Pager [phone] (M-F; [place])  Please contact [facility] for night-coverage after hours (4p -7a ) and weekends on [email or web address]  Patient seen and examined with the above-signed Advanced Practice Provider and/or Housestaff. I personally reviewed laboratory data, imaging studies and relevant notes. I independently examined the patient and formulated the important aspects of the plan. I have edited the note to reflect any of my changes or salient points. I have personally discussed the plan with the patient and/or family.  Remains in AF on IV amio. Pending DC-CV today. Off milrinone. Co-ox pending. CVP climbing. Lasix increased. Renal function stable.   General:  Well appearing. No resp difficulty HEENT: normal Neck: supple. JVP 8-9. Carotids 2+ bilat; no bruits. No lymphadenopathy or thryomegaly appreciated. Cor: PMI nondisplaced. Irregular rate & rhythm. No rubs, gallops or murmurs. Lungs: clear Abdomen: soft, nontender, nondistended. No hepatosplenomegaly. No bruits or masses. Good bowel sounds. Extremities: no cyanosis, clubbing, rash, edema Neuro: alert & orientedx3, cranial nerves grossly intact. moves all 4 extremities w/o difficulty. Affect pleasant  Plan TEE/ DC-CV this afternoon. Eliquis has been loaded. Continue IV amio. Watch co-ox closely peri-cardioversion to make sure outputs are stable. Agree with increasing lasix.   Glori Bickers, MD  8:03 AM

## 2020-08-20 NOTE — Progress Notes (Signed)
PROGRESS NOTE    DAILY CRATE   VZD:[id number]  DOB: 01-02-1956  DOA: 08/13/2020 PCP: Rogers Blocker, MD   Brief Narrative:  Christopher Wilkins is an 64 y.o. male with medical history significant ofatrial fibrillation (cardioverted 9/21), hypertension, CHF (EF 30-35% on 06/20/2020)who presents with worsening dyspnea, lower extremity edema, and 7lb wt gain. He was recently admitted from 10/24-10/26 for CHF exacerbation thought to be due to rate uncontrolled A-fib.   Subjective: No complaints. Evaluated in AM prior to cardioversion.  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      Consultants:   cardiology Procedures:   TEE cardioversion Antimicrobials:  Anti-infectives (From admission, onward)   None       Objective: Vitals:   08/20/20 1356 08/20/20 1400 08/20/20 1403 08/20/20 1413  BP: (!) 82/64 106/75 103/74 (!) 113/91    Pulse: 65 64 63 71  Resp: 19 (!) 21 19 17   Temp:      TempSrc:      SpO2: 99% 98% 99% 99%  Weight:      Height:        Intake/Output Summary (Last 24 hours) at 08/20/2020 1458 Last data filed at 08/20/2020 1404 Gross per 24 hour  Intake 913.69 ml  Output 2550 ml  Net -1636.31 ml   Filed Weights   08/18/20 0406 08/19/20 0654 08/20/20 0641  Weight: 89.5 kg 90.4 kg 90.8 kg    Examination: General exam: Appears comfortable  HEENT: PERRLA, oral mucosa moist, no sclera icterus or thrush Respiratory system: Clear to auscultation. Respiratory effort normal. Cardiovascular system: S1 & S2 heard, IIRR.   Gastrointestinal system: Abdomen soft, non-tender, nondistended. Normal bowel sounds. Central nervous system: Alert and oriented. No focal neurological deficits. Extremities: No cyanosis, clubbing or edema Skin: No rashes or ulcers Psychiatry:  Mood & affect appropriate.     Data Reviewed: I have personally reviewed following labs and imaging studies  CBC: Recent Labs  Lab 08/16/20 0145 08/16/20 0145 08/17/20 0535 08/17/20 0535 08/18/20 0440 08/18/20 0440 08/18/20 1138 08/18/20 1149 08/18/20 1150 08/19/20 0445 08/20/20 0622  WBC 6.3  --  5.3  --  5.4  --   --   --   --  5.4 4.6  HGB 15.3   < > 13.7   < > 14.7   < > 16.0 16.0 16.3 15.4 15.7  HCT 47.2   < > 42.0   < > 44.8   < > 47.0 47.0 48.0 48.0 48.1  MCV 86.1  --  84.0  --  84.4  --   --   --   --  84.8 85.6  PLT 279  --  241  --  271  --   --   --   --  270 270   < > = values in this interval not displayed.   Basic Metabolic Panel: Recent Labs  Lab 08/14/20 0349 08/14/20 0349 08/15/20 0352 08/15/20 1322 08/16/20 0145 08/16/20 0145 08/17/20 0535 08/17/20 0535 08/18/20 0440 08/18/20 0440 08/18/20 1138 08/18/20 1149 08/18/20 1150 08/19/20 0445 08/20/20 0622  NA 140   < > 142   < > 139   < > 139   < > 140   < > 145 145 145 140 141  K 3.1*   < > 4.3   < > 3.9   < > 3.0*   < > 3.6   < > 4.1 4.1 4.2 4.2  4.1  CL 107   < > 109   < > 106  --  103  --  106  --   --   --   --  108 110  CO2 21*   < > 24   < > 21*  --  25  --  23  --   --   --   --  23 23  GLUCOSE 95   < > 112*   < > 190*  --  189*  --  208*  --   --   --   --  96 89  BUN 21   < > 22   < > 27*  --  24*  --  19  --   --   --   --  16 18  CREATININE 1.29*   < > 1.66*   < > 1.58*  --  1.38*  --  1.37*  --   --   --   --  1.37* 1.35*  CALCIUM 8.6*   < > 8.8*   < > 8.7*  --  8.2*  --  8.4*  --   --   --   --  8.9 8.7*  MG 1.8  --  2.2  --   --   --   --   --   --   --   --   --   --   --   --    < > = values in this interval not displayed.   GFR: Estimated Creatinine Clearance: 57.6 mL/min (A) (by C-G formula based on SCr of 1.35 mg/dL (H)). Liver Function Tests: No results for input(s): AST, ALT, ALKPHOS, BILITOT, PROT, ALBUMIN in the last 168 hours. No results for input(s): LIPASE, AMYLASE in the last 168 hours. No results for input(s): AMMONIA in the last 168 hours. Coagulation Profile: Recent Labs  Lab 08/20/20 0622  INR 1.2   Cardiac Enzymes: No results for input(s): CKTOTAL, CKMB, CKMBINDEX, TROPONINI in the last 168 hours. BNP (last 3 results) No results for input(s): PROBNP in the last 8760 hours. HbA1C: No results for input(s): HGBA1C in the last 72 hours. CBG: No results for input(s): GLUCAP in the last 168 hours. Lipid Profile: No  results for input(s): CHOL, HDL, LDLCALC, TRIG, CHOLHDL, LDLDIRECT in the last 72 hours. Thyroid Function Tests: No results for input(s): TSH, T4TOTAL, FREET4, T3FREE, THYROIDAB in the last 72 hours. Anemia Panel: No results for input(s): VITAMINB12, FOLATE, FERRITIN, TIBC, IRON, RETICCTPCT in the last 72 hours. Urine analysis: No results found for: COLORURINE, APPEARANCEUR, LABSPEC, PHURINE, GLUCOSEU, HGBUR, BILIRUBINUR, KETONESUR, PROTEINUR, UROBILINOGEN, NITRITE, LEUKOCYTESUR Sepsis Labs: @LABRCNTIP (procalcitonin:4,lacticidven:4) ) Recent Results (from the past 240 hour(s))    Respiratory Panel by RT PCR (Flu A&B, Covid) - Nasopharyngeal Swab     Status: None   Collection Time: 08/13/20  6:36 PM   Specimen: Nasopharyngeal Swab  Result Value Ref Range Status   SARS Coronavirus 2 by RT PCR NEGATIVE NEGATIVE Final    Comment: (NOTE) SARS-CoV-2 target nucleic acids are NOT DETECTED.  The SARS-CoV-2 RNA is generally detectable in upper respiratoy specimens during the acute phase of infection. The lowest concentration of SARS-CoV-2 viral copies this assay can detect is 131 copies/mL. A negative result does not preclude SARS-Cov-2 infection and should not be used as the sole basis for treatment or other patient management decisions. A negative result may occur with  improper specimen collection/handling, submission of specimen other than nasopharyngeal swab, presence of viral mutation(s) within the areas targeted by this assay, and inadequate number of viral copies (<131 copies/mL). A negative result must be combined with clinical observations, patient history, and epidemiological information. The expected result is Negative.  Fact Sheet for Patients:  PinkCheek.be  Fact Sheet for Healthcare Providers:  GravelBags.it  This test is no t yet approved or cleared by the [place] FDA and  has been authorized for detection and/or diagnosis of SARS-CoV-2 by FDA under an Emergency Use Authorization (EUA). This EUA will remain  in effect (meaning this test can be used) for the duration of the COVID-19 declaration under Section 564(b)(1) of the Act, 21 U.S.C. section 360bbb-3(b)(1), unless the authorization is terminated or revoked sooner.     Influenza A by PCR NEGATIVE NEGATIVE Final   Influenza B by PCR NEGATIVE NEGATIVE Final    Comment: (NOTE) The Xpert Xpress SARS-CoV-2/FLU/RSV assay is intended as an aid in  the diagnosis of influenza from Nasopharyngeal swab specimens and  should not be used as  a sole basis for treatment. Nasal washings and  aspirates are unacceptable for Xpert Xpress SARS-CoV-2/FLU/RSV  testing.  Fact Sheet for Patients: PinkCheek.be  Fact Sheet for Healthcare Providers: GravelBags.it  This test is not yet approved or cleared by the [place] FDA and  has been authorized for detection and/or diagnosis of SARS-CoV-2 by  FDA under an Emergency Use Authorization (EUA). This EUA will remain  in effect (meaning this test can be used) for the duration of the  Covid-19 declaration under Section 564(b)(1) of the Act, 21  U.S.C. section 360bbb-3(b)(1), unless the authorization is  terminated or revoked. Performed at [facility], [place] [address] [place], [place] [address]          Radiology Studies: No results found.    Scheduled Meds: . apixaban  5 mg Oral BID  . Chlorhexidine Gluconate Cloth  6 each Topical Daily  . digoxin  0.125 mg Oral Daily  . furosemide  80 mg Oral Daily  . pneumococcal 23 valent vaccine  0.5 mL Intramuscular Tomorrow-1000  . ramelteon  8 mg Oral QHS  . sacubitril-valsartan  1 tablet Oral BID  . sodium chloride flush  10-40 mL Intracatheter Q12H  . sodium chloride  flush  3 mL Intravenous Q12H  . sodium chloride flush  3 mL Intravenous Q12H  . spironolactone  25 mg Oral Daily   Continuous Infusions: . sodium chloride    . amiodarone 30 mg/hr (08/19/20 2203)     LOS: 7 days      Debbe Odea, MD [facility] Pager: [email or web address] 08/20/2020, 2:58 PM

## 2020-08-20 NOTE — Anesthesia Preprocedure Evaluation (Addendum)
Anesthesia Evaluation  Patient identified by MRN, date of birth, ID band Patient awake    Reviewed: Allergy & Precautions, NPO status , Patient's Chart, lab work & pertinent test results, reviewed documented beta blocker date and time   Airway Mallampati: II  TM Distance: >3 FB Neck ROM: Full    Dental  (+) Edentulous Upper, Edentulous Lower   Pulmonary former smoker,  Quit smoking 05/2020   Pulmonary exam normal breath sounds clear to auscultation       Cardiovascular hypertension, Pt. on medications and Pt. on home beta blockers +CHF (LVEF 20%)  Normal cardiovascular exam+ dysrhythmias (eliquis) Atrial Fibrillation  Rhythm:Irregular Rate:Normal  Echo 08/2020: 1. Difficult apical windows (angle, foreshortened) COmpared to echo from  September 2021, LVEF appears to be a little lower . Left ventricular  ejection fraction, by estimation, is 25%. The left ventricle demonstrates  global hypokinesis.  2. Right ventricular systolic function was not well visualized.  3. Limited echo.    Neuro/Psych negative neurological ROS  negative psych ROS   GI/Hepatic negative GI ROS, Neg liver ROS,   Endo/Other  Obesity BMI 32  Renal/GU Renal Insufficiency and CRFRenal diseaseCr 1.35  negative genitourinary   Musculoskeletal negative musculoskeletal ROS (+)   Abdominal   Peds  Hematology negative hematology ROS (+)   Anesthesia Other Findings   Reproductive/Obstetrics negative OB ROS                            Anesthesia Physical Anesthesia Plan  ASA: IV  Anesthesia Plan: General   Post-op Pain Management:    Induction: Intravenous  PONV Risk Score and Plan: 2 and Propofol infusion, TIVA and Treatment may vary due to age or medical condition  Airway Management Planned: Natural Airway and Mask  Additional Equipment: None  Intra-op Plan:   Post-operative Plan:   Informed Consent: I have  reviewed the patients History and Physical, chart, labs and discussed the procedure including the risks, benefits and alternatives for the proposed anesthesia with the patient or authorized representative who has indicated his/her understanding and acceptance.       Plan Discussed with: CRNA  Anesthesia Plan Comments:        Anesthesia Quick Evaluation

## 2020-08-20 NOTE — Transfer of Care (Signed)
Immediate Anesthesia Transfer of Care Note  Patient: Christopher Wilkins  Procedure(s) Performed: TRANSESOPHAGEAL ECHOCARDIOGRAM (TEE) (N/A ) CARDIOVERSION (N/A )  Patient Location: Endoscopy Unit  Anesthesia Type:MAC  Level of Consciousness: awake, alert , oriented and patient cooperative  Airway & Oxygen Therapy: Patient Spontanous Breathing and Patient connected to nasal cannula oxygen  Post-op Assessment: Report given to RN, Post -op Vital signs reviewed and stable and Patient moving all extremities  Post vital signs: Reviewed and stable  Last Vitals:  Vitals Value Taken Time  BP 103/74 08/20/20 1403  Temp 36.2 C 08/20/20 1355  Pulse 64 08/20/20 1404  Resp 17 08/20/20 1404  SpO2 100 % 08/20/20 1404  Vitals shown include unvalidated device data.  Last Pain:  Vitals:   08/20/20 1355  TempSrc: Temporal  PainSc:       Patients Stated Pain Goal: 0 (09/81/19 1478)  Complications: No complications documented.

## 2020-08-20 NOTE — Progress Notes (Signed)
   Called by nursing staff that [name] wants to go home today.   Discussed with [name] at length the importance of one more hospital day.   We discussed medication changed and I have set HF follow up.   If in NSR tomorrow, he should be able to discharge.   [name] is agreeable to stay in the hospital another night.   Saina Waage NP-C  3:29 PM

## 2020-08-20 NOTE — H&P (View-Only) (Signed)
Advanced Heart Failure Rounding Note   Subjective:   Remains off milrinone. CO-OX pending. Earlier CO-OX rejected.   On amio 30 mg per hour.   Wants to go home. Denies SOB.      On milrinone 0.125 mcg/kg/min Ao = 94/63 (76) LV = 101/18 RA = 9 RV = 38/11 PA = 37/18 (24) PCW =16 Fick cardiac output/index = 5.5/2.8 PVR = 1.5 WU FA sat = 95% PA sat = 71%, 72%   Objective:   Weight Range:  Vital Signs:   Temp:  [97.1 F (36.2 C)-97.7 F (36.5 C)] 97.1 F (36.2 C) (11/10 0634) Pulse Rate:  [92-113] 95 (11/10 0634) Resp:  [20-22] 20 (11/10 0038) BP: (119-133)/(92-105) 122/105 (11/10 0634) SpO2:  [98 %-99 %] 98 % (11/10 0634) Weight:  [90.8 kg] 90.8 kg (11/10 0641) Last BM Date: 08/19/20  Weight change: Filed Weights   08/18/20 0406 08/19/20 0654 08/20/20 0641  Weight: 89.5 kg 90.4 kg 90.8 kg    Intake/Output:   Intake/Output Summary (Last 24 hours) at 08/20/2020 0749 Last data filed at 08/20/2020 0600 Gross per 24 hour  Intake 809.69 ml  Output 1525 ml  Net -715.31 ml    CVP 9 Physical Exam: General:  Sitting on the side of the bed. No resp difficulty HEENT: normal Neck: supple. JVP 8-9 . Carotids 2+ bilat; no bruits. No lymphadenopathy or thryomegaly appreciated. Cor: PMI nondisplaced. Irregular rate & rhythm. No rubs, gallops or murmurs. Lungs: clear Abdomen: soft, nontender, nondistended. No hepatosplenomegaly. No bruits or masses. Good bowel sounds. Extremities: no cyanosis, clubbing, rash, edema. RUE PICC  Neuro: alert & orientedx3, cranial nerves grossly intact. moves all 4 extremities w/o difficulty. Affect pleasant Telemetry: A fib 90-100s personally reviewed.   Labs: Basic Metabolic Panel: Recent Labs  Lab 08/14/20 0349 08/14/20 0349 08/15/20 0352 08/15/20 1322 08/16/20 0145 08/16/20 0145 08/17/20 0535 08/17/20 0535 08/18/20 0440 08/18/20 0440 08/18/20 1138 08/18/20 1149 08/18/20 1150 08/19/20 0445 08/20/20 0622  NA 140    < > 142   < > 139   < > 139   < > 140   < > 145 145 145 140 141  K 3.1*   < > 4.3   < > 3.9   < > 3.0*   < > 3.6   < > 4.1 4.1 4.2 4.2 4.1  CL 107   < > 109   < > 106  --  103  --  106  --   --   --   --  108 110  CO2 21*   < > 24   < > 21*  --  25  --  23  --   --   --   --  23 23  GLUCOSE 95   < > 112*   < > 190*  --  189*  --  208*  --   --   --   --  96 89  BUN 21   < > 22   < > 27*  --  24*  --  19  --   --   --   --  16 18  CREATININE 1.29*   < > 1.66*   < > 1.58*  --  1.38*  --  1.37*  --   --   --   --  1.37* 1.35*  CALCIUM 8.6*   < > 8.8*   < > 8.7*   < > 8.2*   < >  8.4*  --   --   --   --  8.9 8.7*  MG 1.8  --  2.2  --   --   --   --   --   --   --   --   --   --   --   --    < > = values in this interval not displayed.    Liver Function Tests: No results for input(s): AST, ALT, ALKPHOS, BILITOT, PROT, ALBUMIN in the last 168 hours. No results for input(s): LIPASE, AMYLASE in the last 168 hours. No results for input(s): AMMONIA in the last 168 hours.  CBC: Recent Labs  Lab 08/16/20 0145 08/16/20 0145 08/17/20 0535 08/17/20 0535 08/18/20 0440 08/18/20 0440 08/18/20 1138 08/18/20 1149 08/18/20 1150 08/19/20 0445 08/20/20 0622  WBC 6.3  --  5.3  --  5.4  --   --   --   --  5.4 4.6  HGB 15.3   < > 13.7   < > 14.7   < > 16.0 16.0 16.3 15.4 15.7  HCT 47.2   < > 42.0   < > 44.8   < > 47.0 47.0 48.0 48.0 48.1  MCV 86.1  --  84.0  --  84.4  --   --   --   --  84.8 85.6  PLT 279  --  241  --  271  --   --   --   --  270 270   < > = values in this interval not displayed.    Cardiac Enzymes: No results for input(s): CKTOTAL, CKMB, CKMBINDEX, TROPONINI in the last 168 hours.  BNP: BNP (last 3 results) Recent Labs    06/19/20 0120 08/03/20 1440 08/13/20 1643  BNP 478.8* 411.6* 920.3*    ProBNP (last 3 results) No results for input(s): PROBNP in the last 8760 hours.    Other results:  Imaging: CARDIAC CATHETERIZATION  Result Date: 08/18/2020 Findings: On  milrinone 0.125 mcg/kg/min Ao = 94/63 (76) LV = 101/18 RA = 9 RV = 38/11 PA = 37/18 (24) PCW =16 Fick cardiac output/index = 5.5/2.8 PVR = 1.5 WU FA sat = 95% PA sat = 71%, 72% Assessment: 1. Normal coronary arteries 2. Severe NICM EF 15-20% (suspect AF related) 3. Well compensated hemodynamics on milrinone 4. 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 Switch to apixaban later today. TEE/DC-CV on Wednesday am. Glori Bickers, MD 11:07 AM    Medications:     Scheduled Medications:  apixaban  5 mg Oral BID   Chlorhexidine Gluconate Cloth  6 each Topical Daily   digoxin  0.125 mg Oral Daily   furosemide  40 mg Oral Daily   pneumococcal 23 valent vaccine  0.5 mL Intramuscular Tomorrow-1000   ramelteon  8 mg Oral QHS   sacubitril-valsartan  1 tablet Oral BID   sodium chloride flush  10-40 mL Intracatheter Q12H   sodium chloride flush  3 mL Intravenous Q12H   sodium chloride flush  3 mL Intravenous Q12H   spironolactone  25 mg Oral Daily    Infusions:  sodium chloride     amiodarone 30 mg/hr (08/19/20 2203)    PRN Medications: sodium chloride, acetaminophen, ondansetron (ZOFRAN) IV, sodium chloride flush, sodium chloride flush, white petrolatum  Allergies  Allergen Reactions   Codeine Nausea And Vomiting   Lisinopril Other (See Comments)    "Didn't do well in my body"   Shellfish Allergy Other (See Comments)  Can tolerate shrimp in 2021 (??)     Assessment/Plan:   1. Acute on chronic systolic HF -> cardiogenic shock - EF 35-40% in 9/21 -> EF 20% - suspect tachy induced - co-ox 44% on 08/15/20 with lactic acidosis. Milrinone started - Cath with normal cors.  - Off milrinone. CO-OX pending.  - Volume status trending up. Increase lasix to 80 mg po daily.  -Continue spiro to 25 mg daily.  - Continue dig - Continue low-dose Entresto  - Hold off on SGT2i . Start soon.  - Renal function stable.   2. Persistent AF with RVR - s/p DC-CV 9/21.  Now with ERAF -TEE/DC_CV today .  - continue IV amio and eliquis.   - will need outpatient sleep study   3. AKI - due to low output/cardiorenal  - improving with milrinone - Creatinine stable at 1.35  4. Tobacco use - counseled on need to quit - No change  5. Hypokalemia - Stable.   TEE/DC-CV today. NPO    Length of Stay: 7   Amy Clegg NP-C  08/20/2020, 7:49 AM  Advanced Heart Failure Team Pager [phone] (M-F; [place])  Please contact [facility] for night-coverage after hours (4p -7a ) and weekends on [email or web address]  Patient seen and examined with the above-signed Advanced Practice Provider and/or Housestaff. I personally reviewed laboratory data, imaging studies and relevant notes. I independently examined the patient and formulated the important aspects of the plan. I have edited the note to reflect any of my changes or salient points. I have personally discussed the plan with the patient and/or family.  Remains in AF on IV amio. Pending DC-CV today. Off milrinone. Co-ox pending. CVP climbing. Lasix increased. Renal function stable.   General:  Well appearing. No resp difficulty HEENT: normal Neck: supple. JVP 8-9. Carotids 2+ bilat; no bruits. No lymphadenopathy or thryomegaly appreciated. Cor: PMI nondisplaced. Irregular rate & rhythm. No rubs, gallops or murmurs. Lungs: clear Abdomen: soft, nontender, nondistended. No hepatosplenomegaly. No bruits or masses. Good bowel sounds. Extremities: no cyanosis, clubbing, rash, edema Neuro: alert & orientedx3, cranial nerves grossly intact. moves all 4 extremities w/o difficulty. Affect pleasant  Plan TEE/ DC-CV this afternoon. Eliquis has been loaded. Continue IV amio. Watch co-ox closely peri-cardioversion to make sure outputs are stable. Agree with increasing lasix.   Glori Bickers, MD  8:03 AM

## 2020-08-20 NOTE — Anesthesia Postprocedure Evaluation (Signed)
Anesthesia Post Note  Patient: Christopher Wilkins  Procedure(s) Performed: TRANSESOPHAGEAL ECHOCARDIOGRAM (TEE) (N/A ) CARDIOVERSION (N/A )     Patient location during evaluation: PACU Anesthesia Type: General Level of consciousness: awake and alert, oriented and patient cooperative Pain management: pain level controlled Vital Signs Assessment: post-procedure vital signs reviewed and stable Respiratory status: spontaneous breathing, nonlabored ventilation and respiratory function stable Cardiovascular status: blood pressure returned to baseline and stable Postop Assessment: no apparent nausea or vomiting Anesthetic complications: no   No complications documented.  Last Vitals:  Vitals:   08/20/20 1400 08/20/20 1403  BP: 106/75 103/74  Pulse: 64 63  Resp: (!) 21 19  Temp:    SpO2: 98% 99%    Last Pain:  Vitals:   08/20/20 1403  TempSrc:   PainSc: 0-No pain                 Pervis Hocking

## 2020-08-20 NOTE — Progress Notes (Signed)
  Echocardiogram Echocardiogram Transesophageal has been performed.  Christopher Wilkins 08/20/2020, 2:07 PM

## 2020-08-20 NOTE — Interval H&P Note (Signed)
History and Physical Interval Note:  08/20/2020 1:02 PM  [name]  has presented today for surgery, with the diagnosis of afib.  The various methods of treatment have been discussed with the patient and family. After consideration of risks, benefits and other options for treatment, the patient has consented to  Procedure(s): TRANSESOPHAGEAL ECHOCARDIOGRAM (TEE) (N/A) CARDIOVERSION (N/A) as a surgical intervention.  The patient's history has been reviewed, patient examined, no change in status, stable for surgery.  I have reviewed the patient's chart and labs.  Questions were answered to the patient's satisfaction.     Marvin Grabill

## 2020-08-20 NOTE — CV Procedure (Signed)
   TRANSESOPHAGEAL ECHOCARDIOGRAM GUIDED DIRECT CURRENT CARDIOVERSION  NAME:  Christopher Wilkins   MRN: [id number] DOB:  September 07, 1956   ADMIT DATE: 08/13/2020  INDICATIONS:   PROCEDURE:   Informed consent was obtained prior to the procedure. The risks, benefits and alternatives for the procedure were discussed and the patient comprehended these risks.  Risks include, but are not limited to, cough, sore throat, vomiting, nausea, somnolence, esophageal and stomach trauma or perforation, bleeding, low blood pressure, aspiration, pneumonia, infection, trauma to the teeth and death.    After a procedural time-out, the oropharynx was anesthetized and the patient was sedated by the anesthesia service. The transesophageal probe was inserted in the esophagus and stomach without difficulty and multiple views were obtained.   FINDINGS:  LEFT VENTRICLE: EF = 20-25%   RIGHT VENTRICLE: Severe HK  LEFT ATRIUM: Severely dialted  LEFT ATRIAL APPENDAGE: No clot  RIGHT ATRIUM: Severely dialted  AORTIC VALVE:  Trileaflet. Mildly calcified. Trivial AI  MITRAL VALVE:   Posterior leaflet calcified and essentially fixed. Mild anterior MR  TRICUSPID VALVE: Normal. Mild TR  PULMONIC VALVE: Normal. Triv PR  INTERATRIAL SEPTUM:  No PFO/ASD  PERICARDIUM: No effusion  DESCENDING AORTA: Moderate plaque   CARDIOVERSION:     Indications:  Atrial Fibrillation  Procedure Details:  Once the TEE was complete, the patient had the defibrillator pads placed in the anterior and posterior position. Once an appropriate level of sedation was achieved, the patient received a single biphasic, synchronized 200J shock with prompt conversion to sinus rhythm. No apparent complications.   Glori Bickers, MD  1:51 PM

## 2020-08-21 ENCOUNTER — Other Ambulatory Visit (HOSPITAL_COMMUNITY): Payer: Self-pay | Admitting: Internal Medicine

## 2020-08-21 DIAGNOSIS — I4891 Unspecified atrial fibrillation: Secondary | ICD-10-CM | POA: Diagnosis not present

## 2020-08-21 DIAGNOSIS — I10 Essential (primary) hypertension: Secondary | ICD-10-CM | POA: Diagnosis not present

## 2020-08-21 DIAGNOSIS — I5043 Acute on chronic combined systolic (congestive) and diastolic (congestive) heart failure: Secondary | ICD-10-CM | POA: Diagnosis not present

## 2020-08-21 DIAGNOSIS — R570 Cardiogenic shock: Secondary | ICD-10-CM | POA: Diagnosis not present

## 2020-08-21 LAB — BASIC METABOLIC PANEL
Anion gap: 7 (ref 5–15)
BUN: 21 mg/dL (ref 8–23)
CO2: 25 mmol/L (ref 22–32)
Calcium: 8.9 mg/dL (ref 8.9–10.3)
Chloride: 107 mmol/L (ref 98–111)
Creatinine, Ser: 1.46 mg/dL — ABNORMAL HIGH (ref 0.61–1.24)
GFR, Estimated: 53 mL/min — ABNORMAL LOW (ref >60)
Glucose, Bld: 91 mg/dL (ref 70–99)
Potassium: 4 mmol/L (ref 3.5–5.1)
Sodium: 139 mmol/L (ref 135–145)

## 2020-08-21 LAB — DIGOXIN LEVEL: Digoxin Level: 0.6 ng/mL — ABNORMAL LOW (ref 1.0–2.0)

## 2020-08-21 LAB — COOXEMETRY PANEL
Carboxyhemoglobin: 0.8 % (ref 0.5–1.5)
Methemoglobin: 0.7 % (ref 0.0–1.5)
O2 Saturation: 71.8 %
Total hemoglobin: 16 g/dL (ref 12.0–16.0)

## 2020-08-21 MED ORDER — FUROSEMIDE 80 MG PO TABS
80.0000 mg | ORAL_TABLET | Freq: Every day | ORAL | 6 refills | Status: DC
Start: 2020-08-21 — End: 2020-09-30

## 2020-08-21 MED ORDER — SACUBITRIL-VALSARTAN 24-26 MG PO TABS
1.0000 | ORAL_TABLET | Freq: Two times a day (BID) | ORAL | 6 refills | Status: DC
Start: 2020-08-21 — End: 2020-09-30

## 2020-08-21 MED ORDER — AMIODARONE HCL 200 MG PO TABS
200.0000 mg | ORAL_TABLET | Freq: Two times a day (BID) | ORAL | Status: DC
  Administered 2020-08-21: 200 mg via ORAL
  Filled 2020-08-21: qty 1

## 2020-08-21 MED ORDER — SPIRONOLACTONE 25 MG PO TABS
25.0000 mg | ORAL_TABLET | Freq: Every day | ORAL | 6 refills | Status: DC
Start: 2020-08-21 — End: 2020-09-30

## 2020-08-21 MED ORDER — AMIODARONE HCL 200 MG PO TABS
200.0000 mg | ORAL_TABLET | Freq: Two times a day (BID) | ORAL | 6 refills | Status: DC
Start: 2020-08-21 — End: 2020-09-01

## 2020-08-21 MED ORDER — DIGOXIN 125 MCG PO TABS
0.1250 mg | ORAL_TABLET | Freq: Every day | ORAL | 6 refills | Status: DC
Start: 2020-08-21 — End: 2020-09-30

## 2020-08-21 MED ORDER — APIXABAN 5 MG PO TABS: 5.0000 mg | ORAL_TABLET | Freq: Two times a day (BID) | ORAL | 6 refills | Status: DC

## 2020-08-21 MED FILL — ENTRESTO 24 MG-26 MG TABLET: 24-26 | 30 days supply | Qty: 60 | Fill #0

## 2020-08-21 MED FILL — DIGOXIN 0.125 MG TABLET: 125 | 30 days supply | Qty: 30 | Fill #0

## 2020-08-21 MED FILL — FUROSEMIDE 80 MG TAB: 80 | 30 days supply | Qty: 30 | Fill #0

## 2020-08-21 MED FILL — AMIODARONE HCL 200 MG TAB: 200 | 30 days supply | Qty: 60 | Fill #0

## 2020-08-21 MED FILL — SPIRONOLACTONE 25 MG TABLET: 25 | 30 days supply | Qty: 30 | Fill #0

## 2020-08-21 NOTE — Discharge Summary (Signed)
Physician Discharge Summary  Christopher Wilkins VQM:[id number] DOB: 1956-01-17 DOA: 08/13/2020  PCP: Rogers Blocker, MD  Admit date: 08/13/2020 Discharge date: 08/21/2020  Admitted From: home Disposition:  home   Recommendations for Outpatient Follow-up:  1. F/u BP and weight  Home Health:  none  Discharge Condition:  stable   CODE STATUS:  Full code   Consultations:  cardiology Procedures/Studies: . R heart cath . TEE cardioversion   Discharge Diagnoses:  Principal Problem:   Acute exacerbation of CHF (congestive heart failure) ([place]) Active Problems:   A-fib with RVR    Essential hypertension    Brief Summary: Christopher Wilkins is an 64 y.o.malewith medical history significant ofatrial fibrillation (cardioverted 9/21), hypertension, CHF (EF 30-35% on 06/20/2020)who presents with worsening dyspnea, lower extremity edema, and 7lb wt gain. He was recently admitted from 10/24-10/26 for CHF exacerbation thought to be due to rate uncontrolled A-fib.   Hospital Course:  Principal Problem:   Acute exacerbation of CHF (congestive heart failure)  - EF previously 35-40% and now 20% - improved with IV Lasix and Milrinone- now on oral Lasix - cardiology feels drop in EF related to rapid HR - 11/8 R heart cath- normal pressures - cardiology recommends the following meds on d/c today: Amio 200 mg twice a day  Eliquis 5 mg twice a day  Spironolactone 25 mg daily  Entresto 24-26 mg twice a day  Digoxin 0.125 mg daily.  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9 kg   Height:        General: Pt is alert, awake, not in acute distress Cardiovascular: RRR, S1/S2 +, no rubs, no gallops Respiratory: CTA bilaterally, no wheezing, no rhonchi Abdominal: Soft, NT, ND, bowel sounds + Extremities: no edema, no cyanosis   Discharge Instructions  Discharge Instructions    (HEART FAILURE PATIENTS) Call MD:  Anytime you have any of the following symptoms: 1) 3 pound weight gain in 24 hours or 5 pounds in 1 week 2) shortness of breath, with or without a dry hacking cough 3) swelling in the hands, feet or stomach 4) if you have to sleep on extra pillows at night in order to breathe.   Complete by: As directed    (HEART FAILURE PATIENTS) Call MD:  Anytime you have any of the following symptoms: 1) 3 pound weight gain in 24 hours or 5 pounds in 1 week 2) shortness of breath, with or without a dry hacking cough 3)  swelling in the hands, feet or stomach 4) if you have to sleep on extra pillows at night in order to breathe.   Complete by: As directed    Diet - low sodium heart healthy   Complete by: As directed    Increase activity slowly   Complete by: As directed    Increase activity slowly   Complete by: As directed      Allergies as of 08/21/2020      Reactions   Codeine Nausea And Vomiting   Lisinopril Other (See Comments)   "Didn't do well in my body"   Shellfish Allergy Other (See Comments)   Can tolerate shrimp in 2021 (??)      Medication List    STOP taking these medications   metoprolol succinate 100 MG 24 hr tablet Commonly known as: Toprol XL     TAKE these medications    amiodarone 200 MG tablet Commonly known as: PACERONE Take 1 tablet (200 mg total) by mouth 2 (two) times daily.   apixaban 5 MG Tabs tablet Commonly known as: ELIQUIS Take 1 tablet (5 mg total) by mouth 2 (two) times daily.   digoxin 0.125 MG tablet Commonly known as: LANOXIN Take 1 tablet (0.125 mg total) by mouth daily.   furosemide 80 MG tablet Commonly known as: LASIX Take 1 tablet (80 mg total) by mouth daily. What changed:   medication strength  how much to take   sacubitril-valsartan 24-26 MG Commonly known as: ENTRESTO Take 1 tablet by mouth 2 (two) times daily.   spironolactone 25 MG tablet Commonly known as: ALDACTONE Take 1 tablet (25 mg total) by mouth daily.       Follow-up Information    [facility] Follow up on 09/01/2020.   Specialty: Cardiology Why: at [place] information: [address] [id number] [place] [phone]             Allergies  Allergen Reactions  . Codeine Nausea And Vomiting  . Lisinopril Other (See Comments)    "Didn't do well in my body"  . Shellfish Allergy Other (See Comments)    Can tolerate shrimp in 2021 (??)      DG Chest 2 View  Result Date: 08/13/2020 CLINICAL DATA:  Shortness of breath EXAM: CHEST - 2 VIEW COMPARISON:  August 13, 2020 FINDINGS: The cardiomediastinal silhouette is unchanged and enlarged in contour. Small RIGHT pleural effusion. No pneumothorax. Diffuse interstitial prominence and peribronchial cuffing, mildly increased in conspicuity in comparison to prior. RIGHT basilar heterogeneous opacity, likely atelectasis. Prominent azygos shadow. Visualized abdomen is unremarkable. Multilevel degenerative changes of the thoracic spine. IMPRESSION: Constellation of findings are favored to reflect pulmonary edema with a small RIGHT pleural effusion and scattered atelectasis. Differential considerations  include atypical infection. Electronically Signed   By: Valentino Saxon MD   On: 08/13/2020 17:08   DG Chest 2 View  Result Date: 08/13/2020 CLINICAL DATA:  Shortness of breath. EXAM: CHEST - 2 VIEW COMPARISON:  August 03, 2020. FINDINGS: Stable cardiomediastinal silhouette. No pneumothorax is noted. Left lung is clear. Minimal right pleural effusion is noted. Minimal right basilar atelectasis or infiltrate is noted. The visualized skeletal structures are unremarkable. IMPRESSION: Minimal right basilar atelectasis or infiltrate is noted with minimal right pleural effusion. Electronically Signed   By: Marijo Conception M.D.   On: 08/13/2020 13:13   CARDIAC CATHETERIZATION  Result Date: 08/18/2020 Findings: On milrinone 0.125  mcg/kg/min Ao = 94/63 (76) LV = 101/18 RA = 9 RV = 38/11 PA = 37/18 (24) PCW =16 Fick cardiac output/index = 5.5/2.8 PVR = 1.5 WU FA sat = 95% PA sat = 71%, 72% Assessment: 1. Normal coronary arteries 2. Severe NICM EF 15-20% (suspect AF related) 3. Well compensated hemodynamics on milrinone 4. 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 Switch to apixaban later today. TEE/DC-CV on Wednesday am. Glori Bickers, MD 11:07 AM  DG CHEST PORT 1 VIEW  Result Date: 08/15/2020 CLINICAL DATA:  PICC line placement EXAM: PORTABLE CHEST 1 VIEW COMPARISON:  Radiograph 08/13/2020 FINDINGS: Right upper extremity PICC tip terminates at the superior cavoatrial junction. Mid to lower lung opacities suspected to reflect a combination of interstitial edema and atelectasis in the setting of CHF with cardiomegaly. No pneumothorax or visible effusion though the costophrenic sulci are largely collimated from view. Remaining cardiomediastinal contours are unremarkable. No acute osseous or soft tissue abnormality. Telemetry leads overlie the chest. IMPRESSION: 1. Right upper extremity PICC terminates at the superior cavoatrial junction. 2. Increasing opacities in the mid to lower lungs  favored to reflect edema and atelectasis though underlying infection is not fully excluded. Electronically Signed   By: Lovena Le M.D.   On: 08/15/2020 21:27   DG Chest Port 1 View  Result Date: 08/03/2020 CLINICAL DATA:  Heart failure EXAM: PORTABLE CHEST 1 VIEW COMPARISON:  08/03/2020 FINDINGS: Resolution of interstitial pulmonary edema. Mild cardiomegaly is unchanged. Normal pleural spaces. IMPRESSION: Resolution of interstitial pulmonary edema. Electronically Signed   By: Ulyses Jarred M.D.   On: 08/03/2020 22:38   DG Chest Portable 1 View  Result Date: 08/03/2020 CLINICAL DATA:  Shortness of breath. EXAM: PORTABLE CHEST 1 VIEW COMPARISON:  June 18, 2020. FINDINGS: Stable cardiomegaly with mild central pulmonary vascular congestion. No pneumothorax or pleural effusion is noted. Both lungs are clear. The visualized skeletal structures are unremarkable. IMPRESSION: Stable cardiomegaly with mild central pulmonary vascular congestion. Electronically Signed   By: Marijo Conception M.D.   On: 08/03/2020 15:26   ECHOCARDIOGRAM LIMITED  Result Date: 08/15/2020    ECHOCARDIOGRAM LIMITED REPORT   Patient Name:   Christopher Wilkins Date of Exam: 08/15/2020 Medical Rec #:  [id number]     Height:       66.0 in Accession #:    [id number]    Weight:       204.6 lb Date of Birth:  07-Mar-1956     BSA:          2.019 m Patient Age:    70 years      BP:           108/84 mmHg Patient Gender: M             HR:           120 bpm. Exam Location:  Inpatient Procedure: Limited Echo Indications:    I48.91* Unspecified atrial fibrillation  History:        Patient has prior history of Echocardiogram examinations, most                 recent 06/20/2020. CHF, Arrythmias:Atrial Fibrillation; Risk                 Factors:Hypertension.  Sonographer:    Raquel Sarna Senior RDCS Referring Phys: [id number] [place]  1. Difficult apical windows (angle, foreshortened) COmpared to echo from September 2021, LVEF appears to be a  little lower . Left ventricular  ejection fraction, by estimation, is 25%. The left ventricle demonstrates global hypokinesis.  2. Right ventricular systolic function was not well visualized.  3. Limited echo. FINDINGS  Left Ventricle: Difficult apical windows (angle, foreshortened) COmpared to echo from September 2021, LVEF appears to be a little lower. Left ventricular ejection fraction, by estimation, is 25%%. The left ventricle has severely decreased function. The left ventricle demonstrates global hypokinesis. Right Ventricle: The right ventricular size is not well visualized. Right vetricular wall thickness was not assessed. Right ventricular systolic function was not well visualized. Pericardium: There is no evidence of pericardial effusion. Mitral Valve: The mitral valve is abnormal. There is moderate thickening of the mitral valve leaflet(s). There is mild calcification of the mitral valve leaflet(s). Tricuspid Valve: The tricuspid valve is grossly normal. Aortic Valve: The aortic valve is calcified. Venous: The inferior vena cava is dilated in size with less than 50% respiratory variability, suggesting right atrial pressure of 15 mmHg.  LV Volumes (MOD) LV vol d, MOD A2C: 141.0 ml LV vol d, MOD A4C: 139.0 ml LV vol s, MOD A2C: 115.0 ml LV vol s, MOD A4C: 105.0 ml LV SV MOD A2C:     26.0 ml LV SV MOD A4C:     139.0 ml LV SV MOD BP:      29.3 ml Dorris Carnes MD Electronically signed by Dorris Carnes MD Signature Date/Time: 08/15/2020/8:46:39 PM    Final    [place] EKG SITE RITE  Result Date: 08/15/2020 If Site Rite image not attached, placement could not be confirmed due to current cardiac rhythm.    The results of significant diagnostics from this hospitalization (including imaging, microbiology, ancillary and laboratory) are listed below for reference.     Microbiology: Recent Results (from the past 240 hour(s))  Respiratory Panel by RT PCR (Flu A&B, Covid) - Nasopharyngeal Swab     Status: None    Collection Time: 08/13/20  6:36 PM   Specimen: Nasopharyngeal Swab  Result Value Ref Range Status   SARS Coronavirus 2 by RT PCR NEGATIVE NEGATIVE Final    Comment: (NOTE) SARS-CoV-2 target nucleic acids are NOT DETECTED.  The SARS-CoV-2 RNA is generally detectable in upper respiratoy specimens during the acute phase of infection. The lowest concentration of SARS-CoV-2 viral copies this assay can detect is 131 copies/mL. A negative result does not preclude SARS-Cov-2 infection and should not be used as the sole basis for treatment or other patient management decisions. A negative result may occur with  improper specimen collection/handling, submission of specimen other than nasopharyngeal swab, presence of viral mutation(s) within the areas targeted by this assay, and inadequate number of viral copies (<131 copies/mL). A negative result must be combined with clinical observations, patient history, and epidemiological information. The expected result is Negative.  Fact Sheet for Patients:  PinkCheek.be  Fact Sheet for Healthcare Providers:  GravelBags.it  This test is no t yet approved or cleared by the [place] FDA and  has been authorized for detection and/or diagnosis of SARS-CoV-2 by FDA under an Emergency Use Authorization (EUA). This EUA will remain  in effect (meaning this test can be used) for the duration of the COVID-19 declaration under Section 564(b)(1) of the Act, 21 U.S.C. section 360bbb-3(b)(1), unless the authorization is terminated or revoked sooner.     Influenza A by PCR NEGATIVE NEGATIVE Final   Influenza B by PCR NEGATIVE NEGATIVE Final    Comment: (NOTE) The Xpert Xpress SARS-CoV-2/FLU/RSV assay is intended as an aid in  the diagnosis of influenza from Nasopharyngeal swab specimens and  should not be used as a sole basis for treatment. Nasal washings and  aspirates are unacceptable for Xpert  Xpress SARS-CoV-2/FLU/RSV  testing.  Fact Sheet for Patients: PinkCheek.be  Fact Sheet for Healthcare Providers: GravelBags.it  This test is not yet approved or cleared by the [place] FDA and  has been authorized for detection and/or diagnosis of SARS-CoV-2 by  FDA under an Emergency Use Authorization (EUA). This EUA will remain  in effect (meaning this test can be used) for the duration of the  Covid-19 declaration under Section 564(b)(1) of the Act, 21  U.S.C. section 360bbb-3(b)(1), unless the authorization is  terminated or revoked. Performed at [facility], [place] [address], [place],  [address]      Labs: BNP (last 3 results) Recent Labs    06/19/20 0120 08/03/20 1440 08/13/20 1643  BNP 478.8* 411.6* 703.5*   Basic Metabolic Panel: Recent Labs  Lab 08/15/20 0352 08/15/20 1322 08/17/20 0535 08/17/20 0535 08/18/20 0440 08/18/20 1138 08/18/20 1149 08/18/20 1150 08/19/20 0445 08/20/20 0622 08/21/20 0439  NA 142   < > 139   < > 140   < > 145 145 140 141 139  K 4.3   < > 3.0*   < > 3.6   < > 4.1 4.2 4.2 4.1 4.0  CL 109   < > 103  --  106  --   --   --  108 110 107  CO2 24   < > 25  --  23  --   --   --  23 23 25   GLUCOSE 112*   < > 189*  --  208*  --   --   --  96 89 91  BUN 22   < > 24*  --  19  --   --   --  16 18 21   CREATININE 1.66*   < > 1.38*  --  1.37*  --   --   --  1.37* 1.35* 1.46*  CALCIUM 8.8*   < > 8.2*  --  8.4*  --   --   --  8.9 8.7* 8.9  MG 2.2  --   --   --   --   --   --   --   --   --   --    < > = values in this interval not displayed.   Liver Function Tests: No results for input(s): AST, ALT, ALKPHOS, BILITOT, PROT, ALBUMIN in the last 168 hours. No results for input(s): LIPASE, AMYLASE in the last 168 hours. No results for input(s): AMMONIA in the last 168 hours. CBC: Recent Labs  Lab 08/16/20 0145 08/16/20 0145 08/17/20 0535 08/17/20 0535 08/18/20 0440  08/18/20 0440 08/18/20 1138 08/18/20 1149 08/18/20 1150 08/19/20 0445 08/20/20 0622  WBC 6.3  --  5.3  --  5.4  --   --   --   --  5.4 4.6  HGB 15.3   < > 13.7   < > 14.7   < > 16.0 16.0 16.3 15.4 15.7  HCT 47.2   < > 42.0   < > 44.8   < > 47.0 47.0 48.0 48.0 48.1  MCV 86.1  --  84.0  --  84.4  --   --   --   --  84.8 85.6  PLT 279  --  241  --  271  --   --   --   --  270 270   < > = values in this interval not displayed.   Cardiac Enzymes: No results for input(s): CKTOTAL, CKMB, CKMBINDEX, TROPONINI in the last 168 hours. BNP: Invalid input(s): POCBNP CBG: No results for input(s): GLUCAP in the last 168 hours. D-Dimer No results for input(s): DDIMER in the last 72 hours. Hgb A1c No results for input(s): HGBA1C in the last 72 hours. Lipid Profile No results for input(s): CHOL, HDL, LDLCALC, TRIG, CHOLHDL, LDLDIRECT in the last 72 hours. Thyroid function studies No results for input(s): TSH, T4TOTAL, T3FREE, THYROIDAB in the last 72 hours.  Invalid input(s): FREET3 Anemia work up No results for input(s): VITAMINB12, FOLATE, FERRITIN, TIBC, IRON, RETICCTPCT in the last 72 hours. Urinalysis No results found for: COLORURINE, APPEARANCEUR, [place], [place], [place], [place], [place], [place], PROTEINUR, UROBILINOGEN, NITRITE, LEUKOCYTESUR Sepsis Labs Invalid input(s): PROCALCITONIN,  WBC,  LACTICIDVEN Microbiology Recent Results (from the past 240 hour(s))  Respiratory Panel by RT PCR (Flu A&B, Covid) - Nasopharyngeal Swab     Status: None   Collection Time: 08/13/20  6:36 PM   Specimen: Nasopharyngeal Swab  Result Value Ref Range Status   SARS Coronavirus 2 by RT PCR NEGATIVE NEGATIVE Final    Comment: (NOTE) SARS-CoV-2 target nucleic acids are NOT DETECTED.  The SARS-CoV-2 RNA is generally detectable in upper respiratoy specimens during the acute phase of infection. The lowest concentration of SARS-CoV-2 viral copies this assay can detect is 131 copies/mL. A  negative result does not preclude SARS-Cov-2 infection and should not be used as the sole basis for treatment or other patient management decisions. A negative result may occur with  improper specimen collection/handling, submission of specimen other than nasopharyngeal swab, presence of viral mutation(s) within the areas targeted by this assay, and inadequate number of viral copies (<131 copies/mL). A negative result must be combined with clinical observations, patient history, and epidemiological information. The expected result is Negative.  Fact Sheet for Patients:  PinkCheek.be  Fact Sheet for Healthcare Providers:  GravelBags.it  This test is no t yet approved or cleared by the [place] FDA and  has been authorized for detection and/or diagnosis of SARS-CoV-2 by FDA under an Emergency Use Authorization (EUA). This EUA will remain  in effect (meaning this test can be used) for the duration of the COVID-19 declaration under Section 564(b)(1) of the Act, 21 U.S.C. section 360bbb-3(b)(1), unless the authorization is terminated or revoked sooner.     Influenza A by PCR NEGATIVE NEGATIVE Final   Influenza B by PCR NEGATIVE NEGATIVE Final    Comment: (NOTE) The Xpert Xpress SARS-CoV-2/FLU/RSV assay is intended as an aid in  the diagnosis of influenza from Nasopharyngeal swab specimens and  should not be used as a sole basis for treatment. Nasal washings and  aspirates are unacceptable for Xpert Xpress SARS-CoV-2/FLU/RSV  testing.  Fact Sheet for Patients: PinkCheek.be  Fact Sheet for Healthcare Providers: GravelBags.it  This test is not yet approved or cleared by the [place] FDA and  has been authorized for detection and/or diagnosis of SARS-CoV-2 by  FDA under an Emergency Use Authorization (EUA). This EUA will remain  in effect (meaning this test can  be used) for the duration of the  Covid-19 declaration under Section 564(b)(1) of the Act, 21  U.S.C. section 360bbb-3(b)(1), unless the authorization is  terminated or revoked. Performed at [facility], [place] [address], [place], [place] [address]      Time coordinating discharge in minutes: 32  SIGNED:  Debbe Odea, MD  [facility] 08/21/2020, 8:38 AM

## 2020-08-21 NOTE — Progress Notes (Addendum)
CARDIAC REHAB PHASE I   PRE:  Rate/Rhythm: 77 SR    BP: sitting 88/76    SaO2:   MODE:  Ambulation: 470 ft   POST:  Rate/Rhythm: 86 SR    BP: sitting 132/88     SaO2:   Pt ambulated without problems. BP low initially but asx. Reviewed HF management and gave pt Off the Beat book. He was able to perform teach back. Discussed walking as tolerated. Pt receptive. He sts he quit smoking in August. Encouraged continued cessation and discussed methods to distract him when he is tempted. [phone]   [place], ACSM 08/21/2020 10:12 AM

## 2020-08-21 NOTE — Care Management Important Message (Signed)
Important Message  Patient Details  Name: Christopher Wilkins MRN: [id number] Date of Birth: 1956-07-09   Medicare Important Message Given:  Yes     Shelda Altes 08/21/2020, 11:21 AM

## 2020-08-21 NOTE — Progress Notes (Addendum)
Advanced Heart Failure Rounding Note   Subjective:   S/P DC-CV--> NSR. Remains on amio 30 mg per hour.   Wants to go home. Denies SOB.   Objective:   Weight Range:  Vital Signs:   Temp:  [97.2 F (36.2 C)-98.7 F (37.1 C)] 98.7 F (37.1 C) (11/11 0524) Pulse Rate:  [63-97] 80 (11/11 0524) Resp:  [16-30] 18 (11/11 0524) BP: (74-150)/(57-97) 125/96 (11/11 0524) SpO2:  [96 %-100 %] 98 % (11/11 0524) Weight:  [89.9 kg] 89.9 kg (11/11 0524) Last BM Date: 08/19/20  Weight change: Filed Weights   08/19/20 0654 08/20/20 0641 08/21/20 0524  Weight: 90.4 kg 90.8 kg 89.9 kg    Intake/Output:   Intake/Output Summary (Last 24 hours) at 08/21/2020 0657 Last data filed at 08/21/2020 0525 Gross per 24 hour  Intake 690.69 ml  Output 2250 ml  Net -1559.31 ml     Physical Exam: General:  Well appearing. No resp difficulty HEENT: normal Neck: supple.JVP 6-7 . Carotids 2+ bilat; no bruits. No lymphadenopathy or thryomegaly appreciated. Cor: PMI nondisplaced. Regular rate & rhythm. No rubs, gallops or murmurs. Lungs: clear Abdomen: soft, nontender, nondistended. No hepatosplenomegaly. No bruits or masses. Good bowel sounds. Extremities: no cyanosis, clubbing, rash, edema. RUE PICC  Neuro: alert & orientedx3, cranial nerves grossly intact. moves all 4 extremities w/o difficulty. Affect pleasant Telemetry: NSR 70s personally reviewed.  Labs: Basic Metabolic Panel: Recent Labs  Lab 08/15/20 0352 08/15/20 1322 08/17/20 0535 08/17/20 0535 08/18/20 0440 08/18/20 0440 08/18/20 1138 08/18/20 1149 08/18/20 1150 08/19/20 0445 08/20/20 0622 08/21/20 0439  NA 142   < > 139   < > 140  --    < > 145 145 140 141 139  K 4.3   < > 3.0*   < > 3.6  --    < > 4.1 4.2 4.2 4.1 4.0  CL 109   < > 103  --  106  --   --   --   --  108 110 107  CO2 24   < > 25  --  23  --   --   --   --  23 23 25   GLUCOSE 112*   < > 189*  --  208*  --   --   --   --  96 89 91  BUN 22   < > 24*  --  19  --    --   --   --  16 18 21   CREATININE 1.66*   < > 1.38*  --  1.37*  --   --   --   --  1.37* 1.35* 1.46*  CALCIUM 8.8*   < > 8.2*   < > 8.4*   < >  --   --   --  8.9 8.7* 8.9  MG 2.2  --   --   --   --   --   --   --   --   --   --   --    < > = values in this interval not displayed.    Liver Function Tests: No results for input(s): AST, ALT, ALKPHOS, BILITOT, PROT, ALBUMIN in the last 168 hours. No results for input(s): LIPASE, AMYLASE in the last 168 hours. No results for input(s): AMMONIA in the last 168 hours.  CBC: Recent Labs  Lab 08/16/20 0145 08/16/20 0145 08/17/20 0535 08/17/20 0535 08/18/20 0440 08/18/20 0440 08/18/20 1138 08/18/20 1149 08/18/20 1150  08/19/20 0445 08/20/20 0622  WBC 6.3  --  5.3  --  5.4  --   --   --   --  5.4 4.6  HGB 15.3   < > 13.7   < > 14.7   < > 16.0 16.0 16.3 15.4 15.7  HCT 47.2   < > 42.0   < > 44.8   < > 47.0 47.0 48.0 48.0 48.1  MCV 86.1  --  84.0  --  84.4  --   --   --   --  84.8 85.6  PLT 279  --  241  --  271  --   --   --   --  270 270   < > = values in this interval not displayed.    Cardiac Enzymes: No results for input(s): CKTOTAL, CKMB, CKMBINDEX, TROPONINI in the last 168 hours.  BNP: BNP (last 3 results) Recent Labs    06/19/20 0120 08/03/20 1440 08/13/20 1643  BNP 478.8* 411.6* 920.3*    ProBNP (last 3 results) No results for input(s): PROBNP in the last 8760 hours.    Other results:  Imaging: No results found.   Medications:     Scheduled Medications: . apixaban  5 mg Oral BID  . Chlorhexidine Gluconate Cloth  6 each Topical Daily  . digoxin  0.125 mg Oral Daily  . furosemide  80 mg Oral Daily  . pneumococcal 23 valent vaccine  0.5 mL Intramuscular Tomorrow-1000  . ramelteon  8 mg Oral QHS  . sacubitril-valsartan  1 tablet Oral BID  . sodium chloride flush  10-40 mL Intracatheter Q12H  . sodium chloride flush  3 mL Intravenous Q12H  . sodium chloride flush  3 mL Intravenous Q12H  . spironolactone   25 mg Oral Daily    Infusions: . sodium chloride    . amiodarone 30 mg/hr (08/20/20 2032)    PRN Medications: sodium chloride, acetaminophen, ondansetron (ZOFRAN) IV, sodium chloride flush, sodium chloride flush, white petrolatum  Allergies  Allergen Reactions  . Codeine Nausea And Vomiting  . Lisinopril Other (See Comments)    "Didn't do well in my body"  . Shellfish Allergy Other (See Comments)    Can tolerate shrimp in 2021 (??)     Assessment/Plan:   1. Acute on chronic systolic HF -> cardiogenic shock - EF 35-40% in 9/21 -> EF 20% - suspect tachy induced - co-ox 44% on 08/15/20 with lactic acidosis. Milrinone started - Cath with normal cors.  - Off milrinone.CO-OX stable.  - No bb for now. Start as an outpatient.  - Volume status stable. Continue lasix to 80 mg po daily.  -Continue spiro to 25 mg daily.  - Continue dig. Dig level 0.6  - Continue low-dose Entresto  - Hold off on SGT2i . Start soon as outpatient.  - Renal function stable.   2. Persistent AF with RVR - s/p DC-CV 9/21. Now with ERAF -TEE/DC_CV --> conversion to NSR.   - Stop amio . Start amio 200 mg twice a day we will taper at his follow up.  - Continue eliquis 5 mg twic4e a dat.  - will need outpatient sleep study   3. AKI - due to low output/cardiorenal  - improving with milrinone - Creatinine stable at 1.4   4. Tobacco use - counseled on need to quit - No change  5. Hypokalemia - Stable.   D/C Meds Amio 200 mg twice a day  Eliquis 5 mg twice a  day  Spironolactone 25 mg daily  Entresto 24-26 mg twice a day  Digoxin 0.125 mg daily.  Lasix 80 mg po daily  HF Follow up set up. We will check cbc, bmet, ekg at that time.   All meds should go to transition of care clinic.     Length of Stay: [place] NP-C  08/21/2020, 6:57 AM  Advanced Heart Failure Team Pager [phone] (M-F; 7a - 4p)  Please contact [facility] for night-coverage after hours (4p -7a ) and weekends  on [email or web address]  Patient seen and examined with the above-signed Advanced Practice Provider and/or Housestaff. I personally reviewed laboratory data, imaging studies and relevant notes. I independently examined the patient and formulated the important aspects of the plan. I have edited the note to reflect any of my changes or salient points. I have personally discussed the plan with the patient and/or family.  Remains in NSR after DC-CV yesterday. Co-ox 72% Volume status good.  [place] he wants to go home   General:  Well appearing. No resp difficulty HEENT: normal Neck: supple. no JVD. Carotids 2+ bilat; no bruits. No lymphadenopathy or thryomegaly appreciated. Cor: PMI nondisplaced. Regular rate & rhythm. No rubs, gallops or murmurs. Lungs: clear Abdomen: soft, nontender, nondistended. No hepatosplenomegaly. No bruits or masses. Good bowel sounds. Extremities: no cyanosis, clubbing, rash, edema Neuro: alert & orientedx3, cranial nerves grossly intact. moves all 4 extremities w/o difficulty. Affect pleasant  He looks good. Can go home on above meds. Will need close f/u in HF Clinic.   Glori Bickers, MD  8:09 AM

## 2020-08-26 ENCOUNTER — Telehealth: Payer: Self-pay

## 2020-08-26 NOTE — Telephone Encounter (Signed)
Please accommodate pt ASAP if possible with  Dr Chapman Fitch  Copied from [place] [phone]. Topic: Quick Communication - See Telephone Encounter >> Aug 26, 2020  4:56 PM Loma [place] wrote: CRM for notification. See Telephone encounter for: 08/26/20. Latta calling, pt is in and out of [facility] and needs [facility] care. Pt listed Dr Chapman Fitch as PCP. Did have HFU in Oct 21 with [facility]. The PCP listed Kevan Ny.. states has never see pt. Langley Gauss is concerned as pt is discharged , has A-fib  that he can not detect and rapid  heart. Need a PCP and she is at the end of her rope. Want to see if Dr Chapman Fitch would take on pt since HFU and could call her so they can help manage his situation. Call back [place]

## 2020-08-28 NOTE — Telephone Encounter (Signed)
Called and LVM to return call and schedule an appt.

## 2020-09-01 ENCOUNTER — Other Ambulatory Visit: Payer: Self-pay

## 2020-09-01 ENCOUNTER — Ambulatory Visit (HOSPITAL_COMMUNITY)
Admit: 2020-09-01 | Discharge: 2020-09-01 | Disposition: A | Discharge status: Home / Self Care | Payer: Medicare HMO | Attending: Cardiology | Admitting: Cardiology

## 2020-09-01 ENCOUNTER — Encounter (HOSPITAL_COMMUNITY): Payer: Self-pay

## 2020-09-01 ENCOUNTER — Other Ambulatory Visit (HOSPITAL_COMMUNITY): Payer: Self-pay | Admitting: Cardiology

## 2020-09-01 ENCOUNTER — Telehealth (HOSPITAL_COMMUNITY): Payer: Self-pay | Admitting: Licensed Clinical Social Worker

## 2020-09-01 VITALS — BP 110/74 | HR 93 | Wt 201.4 lb

## 2020-09-01 DIAGNOSIS — Z7901 Long term (current) use of anticoagulants: Secondary | ICD-10-CM | POA: Insufficient documentation

## 2020-09-01 DIAGNOSIS — Z6832 Body mass index (BMI) 32.0-32.9, adult: Secondary | ICD-10-CM | POA: Diagnosis not present

## 2020-09-01 DIAGNOSIS — Z885 Allergy status to narcotic agent status: Secondary | ICD-10-CM | POA: Insufficient documentation

## 2020-09-01 DIAGNOSIS — E669 Obesity, unspecified: Secondary | ICD-10-CM | POA: Insufficient documentation

## 2020-09-01 DIAGNOSIS — I110 Hypertensive heart disease with heart failure: Secondary | ICD-10-CM | POA: Insufficient documentation

## 2020-09-01 DIAGNOSIS — I4819 Other persistent atrial fibrillation: Secondary | ICD-10-CM | POA: Diagnosis not present

## 2020-09-01 DIAGNOSIS — Z888 Allergy status to other drugs, medicaments and biological substances status: Secondary | ICD-10-CM | POA: Insufficient documentation

## 2020-09-01 DIAGNOSIS — I5022 Chronic systolic (congestive) heart failure: Secondary | ICD-10-CM

## 2020-09-01 DIAGNOSIS — Z79899 Other long term (current) drug therapy: Secondary | ICD-10-CM | POA: Insufficient documentation

## 2020-09-01 DIAGNOSIS — Z87891 Personal history of nicotine dependence: Secondary | ICD-10-CM | POA: Diagnosis not present

## 2020-09-01 LAB — CBC
HCT: 54.4 % — ABNORMAL HIGH (ref 39.0–52.0)
Hemoglobin: 17.4 g/dL — ABNORMAL HIGH (ref 13.0–17.0)
MCH: 27.2 pg (ref 26.0–34.0)
MCHC: 32 g/dL (ref 30.0–36.0)
MCV: 85 fL (ref 80.0–100.0)
Platelets: 319 10*3/uL (ref 150–400)
RBC: 6.4 MIL/uL — ABNORMAL HIGH (ref 4.22–5.81)
RDW: 14.7 % (ref 11.5–15.5)
WBC: 4.6 10*3/uL (ref 4.0–10.5)
nRBC: 0 % (ref 0.0–0.2)

## 2020-09-01 LAB — COMPREHENSIVE METABOLIC PANEL
ALT: 51 U/L — ABNORMAL HIGH (ref 0–44)
AST: 54 U/L — ABNORMAL HIGH (ref 15–41)
Albumin: 3.1 g/dL — ABNORMAL LOW (ref 3.5–5.0)
Alkaline Phosphatase: 59 U/L (ref 38–126)
Anion gap: 13 (ref 5–15)
BUN: 24 mg/dL — ABNORMAL HIGH (ref 8–23)
CO2: 24 mmol/L (ref 22–32)
Calcium: 9.1 mg/dL (ref 8.9–10.3)
Chloride: 105 mmol/L (ref 98–111)
Creatinine, Ser: 1.37 mg/dL — ABNORMAL HIGH (ref 0.61–1.24)
GFR, Estimated: 57 mL/min — ABNORMAL LOW (ref >60)
Glucose, Bld: 114 mg/dL — ABNORMAL HIGH (ref 70–99)
Potassium: 4.7 mmol/L (ref 3.5–5.1)
Sodium: 142 mmol/L (ref 135–145)
Total Bilirubin: 0.9 mg/dL (ref 0.3–1.2)
Total Protein: 6.8 g/dL (ref 6.5–8.1)

## 2020-09-01 LAB — DIGOXIN LEVEL: Digoxin Level: 0.8 ng/mL — ABNORMAL LOW (ref 1.0–2.0)

## 2020-09-01 LAB — TSH: TSH: 1.403 u[IU]/mL (ref 0.350–4.500)

## 2020-09-01 MED ORDER — DAPAGLIFLOZIN PROPANEDIOL 10 MG PO TABS: 10.0000 mg | ORAL_TABLET | Freq: Every day | ORAL | 11 refills | Status: DC

## 2020-09-01 MED ORDER — METOPROLOL SUCCINATE ER 25 MG PO TB24: 12.5000 mg | ORAL_TABLET | Freq: Every day | ORAL | 11 refills | Status: DC

## 2020-09-01 MED ORDER — AMIODARONE HCL 200 MG PO TABS
200.0000 mg | ORAL_TABLET | Freq: Every day | ORAL | 11 refills | Status: DC
Start: 2020-09-01 — End: 2020-09-30

## 2020-09-01 MED FILL — ELIQUIS 5 MG TABLET: 5 | 30 days supply | Qty: 60 | Fill #0

## 2020-09-01 MED FILL — METOPROLOL SUCCINATE ER 25: 25 | 30 days supply | Qty: 15 | Fill #0

## 2020-09-01 MED FILL — FARXIGA 10 MG TABLET: 10 | 30 days supply | Qty: 30 | Fill #0

## 2020-09-01 NOTE — Progress Notes (Signed)
Advanced Heart Failure Clinic Note   Referring Physician: PCP: Rogers Blocker, MD PCP-Cardiologist: Donato Heinz, MD  [facility]: Dr. Haroldine Laws   HPI:  64 y/o AAM w/ h/o obesity, HTN, tobacco use, systolic heart failure and atrial fibrillation.   Admitted 06/2020 with acute onset CHF and afib. Echo showed LVEF of 35-40%. Underwent TEE/DCCV with restoration of sinus rhythm. Also w/ severe LAE at risk for early recurrence. CHADS2-VASc score of 2. Discharged onEliquis, Entresto, Toprol and lasix.   Readmitted 11/21 with recurrent HF and ERAF. EF further reduced down to 20% on repeat echo. Developed shock with AKI and lactic acidosis. Initial Co-ox 44%. CVP 14.  Started on milrinone. Diuresed w/ IV Lasix. Had [facility] showing normal cors and well compensated hemodynamics on milrinone. He was able to wean off milrinone w/ stable Co-ox and underwent repeat TEE/ DCCV back to NSR. Was discharged home on 11/11/. D/c wt 198 lb.   He now presents to clinic for post hospital f/u. EKG shows he is back in afib. HR 98 bpm. Denies palpitations. SOB walking up inclines. No dyspnea at rest or walking on flat surfaces. No orthopnea/ PND. Reports full med compliance. BP stable. No orthostatic symptoms. Wt up 3 lb since d/c at 201 lb.   Review of systems complete and found to be negative unless listed in HPI.      Past Medical History:  Diagnosis Date  . Atrial fibrillation ([place])   . CHF (congestive heart failure) ([place])   . Dyspnea   . Hypertension   . Insomnia     Current Outpatient Medications  Medication Sig Dispense Refill  . amiodarone (PACERONE) 200 MG tablet Take 1 tablet (200 mg total) by mouth 2 (two) times daily. 45 tablet 6  . apixaban (ELIQUIS) 5 MG TABS tablet Take 1 tablet (5 mg total) by mouth 2 (two) times daily. 60 tablet 6  . digoxin (LANOXIN) 0.125 MG tablet Take 1 tablet (0.125 mg total) by mouth daily. 30 tablet 6  . furosemide (LASIX) 80 MG tablet Take 1 tablet (80 mg total)  by mouth daily. 30 tablet 6  . sacubitril-valsartan (ENTRESTO) 24-26 MG Take 1 tablet by mouth 2 (two) times daily. 60 tablet 6  . spironolactone (ALDACTONE) 25 MG tablet Take 1 tablet (25 mg total) by mouth daily. 30 tablet 6   No current facility-administered medications for this encounter.    Allergies  Allergen Reactions  . Codeine Nausea And Vomiting  . Lisinopril Other (See Comments)    "Didn't do well in my body"  . Shellfish Allergy Other (See Comments)    Can tolerate shrimp in 2021 (??)      Social History   Socioeconomic History  . Marital status: Single    Spouse name: Not on file  . Number of children: Not on file  . Years of education: Not on file  . Highest education level: Not on file  Occupational History  . Not on file  Tobacco Use  . Smoking status: Former Smoker    Packs/day: 0.25    Types: Cigarettes    Quit date: 05/14/2020    Years since quitting: 0.3  . Smokeless tobacco: Never Used  Vaping Use  . Vaping Use: Never used  Substance and Sexual Activity  . Alcohol use: Yes    Alcohol/week: 0.0 standard drinks    Comment: occasionally; once every 2 weeks  . Drug use: No  . Sexual activity: Not on file  Other Topics Concern  .  Not on file  Social History Narrative  . Not on file   Social Determinants of Health   Financial Resource Strain:   . Difficulty of Paying Living Expenses: Not on file  Food Insecurity:   . Worried About Charity fundraiser in the Last Year: Not on file  . Ran Out of Food in the Last Year: Not on file  Transportation Needs:   . Lack of Transportation (Medical): Not on file  . Lack of Transportation (Non-Medical): Not on file  Physical Activity:   . Days of Exercise per Week: Not on file  . Minutes of Exercise per Session: Not on file  Stress:   . Feeling of Stress : Not on file  Social Connections:   . Frequency of Communication with Friends and Family: Not on file  . Frequency of Social Gatherings with Friends  and Family: Not on file  . Attends Religious Services: Not on file  . Active Member of Clubs or Organizations: Not on file  . Attends Archivist Meetings: Not on file  . Marital Status: Not on file  Intimate Partner Violence:   . Fear of Current or Ex-Partner: Not on file  . Emotionally Abused: Not on file  . Physically Abused: Not on file  . Sexually Abused: Not on file      Family History  Problem Relation Age of Onset  . Colon cancer Neg Hx     Vitals:   09/01/20 1216  BP: 110/74  Pulse: 93  SpO2: 99%  Weight: 91.4 kg     PHYSICAL EXAM: General:  Well appearing. No respiratory difficulty HEENT: normal Neck: supple. no JVD. Carotids 2+ bilat; no bruits. No lymphadenopathy or thyromegaly appreciated. Cor: PMI nondisplaced. Irregularly irregular rhythm. No rubs, gallops or murmurs. Lungs: clear Abdomen: soft, nontender, nondistended. No hepatosplenomegaly. No bruits or masses. Good bowel sounds. Extremities: no cyanosis, clubbing, rash, edema Neuro: alert & oriented x 3, cranial nerves grossly intact. moves all 4 extremities w/o difficulty. Affect pleasant.  ECG: Atrial Fibrillation 98 bpm    ASSESSMENT & PLAN:  1. Chronic Systolic HF  - Recent admission 09/73 for a/c systolic heart failure>>Cardiogenic shock requiring milrinone, in setting of Afib w/ RVR.  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   -Volume status stable on exam. NYHA Class II-III.  - Continue lasix to 80 mg po daily.  - Continue spiro to 25 mg daily.  - Continue digoxin, 0.125 mg daily. Check Dig level  - Continue low-dose Entresto, 24-26 mg bid   - Start Farxiga 10 mg daily (hgb A1c 5.9)   - Add low dose  blocker to help w/ HF + better rate control of afib, Toprol XL 12.5 mg daily  - Check BMP today  - Refer to paramedicine to help w/ meds (appeared to have mild confusion w/ some of his meds, pill box was  provided to help w/ organization)  - repeat echo ~3 months after med optimization   2. Persistent Atrial Fibrillation  - s/p DC-CV 9/21. Had ERAF>>underwent repeat DCCV 11/21 back to NSR - back in Afib on EKG today, HR 98 bpm  - Reduce amiodarone 200 mg once a day (doubt we will ever be successful w/ rhythm control. Continue for rate control for now. May eventually d/c). Check HFTs, TSH. Will need annual eye exam  - Add Toprol XL 12.5  mg daily  - Continue eliquis 5 mg bid. Denies abnormal bleeding. Check CBC today  - Refer to Afib Clinic  - suspect underlying OSA (daytime somnolence and snorning). Arrange sleep study    3. HTN - controlled on current regimen - check BMP today       Lyda Jester, PA-C 09/01/20

## 2020-09-01 NOTE — Progress Notes (Signed)
Patient Name: Christopher Wilkins        DOB: 1956/06/14      Height: 5'6"    Weight:201  Office Name:Advanced Heart Failure Clinic           Today's Date:09/01/2020   STOP BANG RISK ASSESSMENT S (snore) Have you been told that you snore?     YES   T (tired) Are you often tired, fatigued, or sleepy during the day?   YES  O (obstruction) Do you stop breathing, choke, or gasp during sleep? NO   P (pressure) Do you have or are you being treated for high blood pressure? YES   B (BMI) Is your body index greater than 35 kg/m? NO   A (age) Are you 30 years old or older? YES   N (neck) Do you have a neck circumference greater than 16 inches?   NO   G (gender) Are you a male? YES   TOTAL STOP/BANG "YES" ANSWERS                                                                        For Office Use Only              Procedure Order Form    YES to 3+ Stop Bang questions OR two clinical symptoms - patient qualifies for WatchPAT (CPT 95800)     Submit: This Form + Patient Face Sheet + Clinical Note via CloudPAT or Fax: [phone]         Clinical Notes: Will consult Sleep Specialist and refer for management of therapy due to patient increased risk of Sleep Apnea. Ordering a sleep study due to the following two clinical symptoms: / Personality changes or irritability R45.4 / Loud snoring R06.83 / History of high blood pressure R03.0 / Insomnia G47.00    I understand that I am proceeding with a home sleep apnea test as ordered by my treating physician. I understand that untreated sleep apnea is a serious cardiovascular risk factor and it is my responsibility to perform the test and seek management for sleep apnea. I will be contacted with the results and be managed for sleep apnea by a local sleep physician. I will be receiving equipment and further instructions from [facility]. I shall promptly ship back the equipment via the included mailing label. I understand my insurance will be billed for  the test and as the patient I am responsible for any insurance related out-of-pocket costs incurred. I have been provided with written instructions and can call for additional video or telephonic instruction, with 24-hour availability of qualified personnel to answer any questions: Patient Help Desk [phone].  Patient Signature ______________________________________________________   Date______________________ Patient Telemedicine Verbal Consent

## 2020-09-01 NOTE — Telephone Encounter (Signed)
CSW called pt to follow up regarding medication cost concerns expressed during appt today.  Pt had signed [employer] and [employer] apps to help with entresto and eliquis costs- pt states they are the main sources of concern for him.  Based on pt reported income pt would also be eligible for Extra Help program so assisted in completing application online.  Pt reported he had worked full time for multiple months this year so unsure if that additional income would affect his eligibility  Pt also started on Farxiga which is another expensive medication- CSW will plan to meet with pt after lab appt next week to have sign that application as well  Will continue to follow and assist as needed  Jorge Ny, [place] [facility] Desk#: [phone] Cell#: [phone]

## 2020-09-01 NOTE — Patient Instructions (Addendum)
Labs done today. We will contact you only if your labs are abnormal.  START Farxiga 10ng 1 tablet by mouth daily.  START Metoprolol XL 25mg  take 1/2 tablet by mouth daily.  DECREASE Amiodarone 200mg  take 1 tablet by mouth daily.  No other medication changes were mad. Please continue all other medications as prescribed.  Your physician recommends that you schedule a follow-up appointment in: 7 days for a lab only appointment,2-3 weeks with APP Clinic and in 10 weeks for an appointment with Dr. Haroldine Laws with an echo prior to your exam.  You have been referred to [facility]. They will contact you to schedule an appointment.  Your provider has recommended that you have a home sleep study.  This has to be approved by your insurance company. We will schedule you an appointment to pick up the equipment to give [place] time to complete this authorization. Once you have the equipment you will download the app on your phone and follow the instructions. YOUR PIN NUMBER IS: [id number]. Once you have completed the test the information is sent to the company through [employer] and you can dispose of the equipment. If your test is positive you will receive a call from Dr Theodosia Blender office [facility]) to set up your CPAP equipment.   If you have any questions or concerns before your next appointment please send [place] a message through [place] or call our office at [phone].    TO LEAVE A MESSAGE FOR THE NURSE SELECT OPTION 2, PLEASE LEAVE A MESSAGE INCLUDING: . YOUR NAME . DATE OF BIRTH . CALL BACK NUMBER . REASON FOR CALL**this is important as we prioritize the call backs  YOU WILL RECEIVE A CALL BACK THE SAME DAY AS LONG AS YOU CALL BEFORE 4:00 PM   Do the following things EVERYDAY: 1) Weigh yourself in the morning before breakfast. Write it down and keep it in a log. 2) Take your medicines as prescribed 3) Eat low salt foods--Limit salt (sodium) to 2000 mg per day.   4) Stay as active as you can everyday 5) Limit all fluids for the day to less than 2 liters   At the [facility], you and your health needs are our priority. As part of our continuing mission to provide you with exceptional heart care, we have created designated Provider Care Teams. These Care Teams include your primary Cardiologist (physician) and Advanced Practice Providers (APPs- Physician Assistants and Nurse Practitioners) who all work together to provide you with the care you need, when you need it.   You may see any of the following providers on your designated Care Team at your next follow up: Marland Kitchen Dr Glori Bickers . Dr Loralie Champagne . Darrick Grinder, NP . Lyda Jester, PA . Audry Riles, PharmD   Please be sure to bring in all your medications bottles to every appointment.

## 2020-09-02 ENCOUNTER — Telehealth (HOSPITAL_COMMUNITY): Payer: Self-pay | Admitting: Licensed Clinical Social Worker

## 2020-09-02 NOTE — Telephone Encounter (Signed)
Completed applications for Entresto and Eliquis assistance sent to manufacturer for review- fax confirmations received- awaiting determination  Christopher Wilkins, [place] [facility] Desk#: [phone] Cell#: [phone]

## 2020-09-10 ENCOUNTER — Other Ambulatory Visit: Payer: Self-pay

## 2020-09-10 ENCOUNTER — Ambulatory Visit (HOSPITAL_COMMUNITY)
Admission: RE | Admit: 2020-09-10 | Discharge: 2020-09-10 | Disposition: A | Discharge status: Home / Self Care | Payer: Medicare HMO | Source: Ambulatory Visit | Attending: Internal Medicine | Admitting: Internal Medicine

## 2020-09-10 ENCOUNTER — Telehealth (HOSPITAL_COMMUNITY): Payer: Self-pay | Admitting: Licensed Clinical Social Worker

## 2020-09-10 DIAGNOSIS — I5022 Chronic systolic (congestive) heart failure: Secondary | ICD-10-CM | POA: Diagnosis present

## 2020-09-10 LAB — BASIC METABOLIC PANEL
Anion gap: 14 (ref 5–15)
BUN: 23 mg/dL (ref 8–23)
CO2: 23 mmol/L (ref 22–32)
Calcium: 8.8 mg/dL — ABNORMAL LOW (ref 8.9–10.3)
Chloride: 104 mmol/L (ref 98–111)
Creatinine, Ser: 1.26 mg/dL — ABNORMAL HIGH (ref 0.61–1.24)
GFR, Estimated: >60 mL/min (ref >60)
Glucose, Bld: 85 mg/dL (ref 70–99)
Potassium: 4.5 mmol/L (ref 3.5–5.1)
Sodium: 141 mmol/L (ref 135–145)

## 2020-09-10 NOTE — Progress Notes (Signed)
Pt was started on Farxiga last visit and needed patient assistance application completed to avoid high copay costs.  CSW met with pt and had him complete and sign patient portion of application  CSW to work on completing and faxing in for review  Jorge Ny, [place] [facility] Desk#: [phone] Cell#: [phone]

## 2020-09-10 NOTE — Telephone Encounter (Signed)
Completed [place] app for Farxiga assistance sent in for review- faxed confirmation received- awaiting determination  Jorge Ny, [place] [facility] Desk#: [phone] Cell#: [phone]

## 2020-09-10 NOTE — Addendum Note (Signed)
Encounter addended by: Jorge Ny, LCSW on: 09/10/2020 10:07 AM  Actions taken: Clinical Note Signed

## 2020-09-15 ENCOUNTER — Inpatient Hospital Stay (HOSPITAL_COMMUNITY): Admission: RE | Admit: 2020-09-15 | Payer: Medicare HMO | Source: Ambulatory Visit | Admitting: Physician Assistant

## 2020-09-15 NOTE — Progress Notes (Incomplete)
Primary Care Physician: Rogers Blocker, MD Primary Cardiologist: Dr Gardiner Rhyme  Primary Electrophysiologist: none [facility]: Dr Haroldine Laws Referring Physician: Ellen Henri   Christopher Wilkins is a 64 y.o. male with a history of HTN, tobacco use, chronic systolic heart failure and atrial fibrillation who presents for consultation in the [facility]. The patient was initially diagnosed with atrial fibrillation 06/2020 after presenting with symptoms of SOB and orthopnea. He was admitted for acute CHF and afib with RVR. He underwent DCCV on 06/20/20. Patient is on Eliquis for a CHADS2VASC score of 3. Echo showed EF 35-40% and severe LAE on 9/9. Patient was readmitted 08/2020 with acute CHF and afib. He was started on amiodarone and had repeat DCCV on 08/20/20. Echo showed EF 20%, LHC showed normal coronaries, suspected tachycardia mediated. On follow up with the [facility], he was back in afib with heart rates 90s. ***  Today, he denies symptoms of ***palpitations, chest pain, shortness of breath, orthopnea, PND, lower extremity edema, dizziness, presyncope, syncope, bleeding, or neurologic sequela. The patient is tolerating medications without difficulties and is otherwise without complaint today.    Atrial Fibrillation Risk Factors:  he does have symptoms or diagnosis of sleep apnea. he does not have a history of rheumatic fever. he {Action; does/does not:19097} have a history of alcohol use. The patient {Action; does/does not:19097} have a history of early familial atrial fibrillation or other arrhythmias.  he has a BMI of There is no height or weight on file to calculate BMI.. There were no vitals filed for this visit.  Family History  Problem Relation Age of Onset  . Colon cancer Neg Hx      Atrial Fibrillation Management history:  Previous antiarrhythmic drugs: amiodarone Previous cardioversions: 06/20/20, 08/20/20 Previous ablations: none CHADS2VASC score: 3  Anticoagulation history: Eliquis   Past Medical History:  Diagnosis Date  . Atrial fibrillation ([place])   . CHF (congestive heart failure) ([place])   . Dyspnea   . Hypertension   . Insomnia    Past Surgical History:  Procedure Laterality Date  . CARDIOVERSION N/A 06/20/2020   Procedure: CARDIOVERSION;  Surgeon: Jerline Pain, MD;  Location: [facility] ENDOSCOPY;  Service: Cardiovascular;  Laterality: N/A;  . CARDIOVERSION N/A 08/20/2020   Procedure: CARDIOVERSION;  Surgeon: [name], MD;  Location: [facility] ENDOSCOPY;  Service: Cardiovascular;  Laterality: N/A;  . CHOLECYSTECTOMY    . OTHER SURGICAL HISTORY     "Shot a couple of times"  . RIGHT/LEFT HEART CATH AND CORONARY ANGIOGRAPHY N/A 08/18/2020   Procedure: RIGHT/LEFT HEART CATH AND CORONARY ANGIOGRAPHY;  Surgeon: [name], MD;  Location: [place] CV LAB;  Service: Cardiovascular;  Laterality: N/A;  . TEE WITHOUT CARDIOVERSION N/A 06/20/2020   Procedure: TRANSESOPHAGEAL ECHOCARDIOGRAM (TEE);  Surgeon: Jerline Pain, MD;  Location: [facility] ENDOSCOPY;  Service: Cardiovascular;  Laterality: N/A;  . TEE WITHOUT CARDIOVERSION N/A 08/20/2020   Procedure: TRANSESOPHAGEAL ECHOCARDIOGRAM (TEE);  Surgeon: [name], MD;  Location: [facility] ENDOSCOPY;  Service: Cardiovascular;  Laterality: N/A;    Current Outpatient Medications  Medication Sig Dispense Refill  . amiodarone (PACERONE) 200 MG tablet Take 1 tablet (200 mg total) by mouth daily. 30 tablet 11  . apixaban (ELIQUIS) 5 MG TABS tablet Take 1 tablet (5 mg total) by mouth 2 (two) times daily. 60 tablet 6  . dapagliflozin propanediol (FARXIGA) 10 MG TABS tablet Take 1 tablet (10 mg total) by mouth daily before breakfast. 30 tablet 11  . digoxin (  LANOXIN) 0.125 MG tablet Take 1 tablet (0.125 mg total) by mouth daily. 30 tablet 6  . furosemide (LASIX) 80 MG tablet Take 1 tablet (80 mg total) by mouth daily. 30 tablet 6  . metoprolol succinate (TOPROL XL) 25 MG 24 hr tablet Take  0.5 tablets (12.5 mg total) by mouth daily. 15 tablet 11  . sacubitril-valsartan (ENTRESTO) 24-26 MG Take 1 tablet by mouth 2 (two) times daily. 60 tablet 6  . spironolactone (ALDACTONE) 25 MG tablet Take 1 tablet (25 mg total) by mouth daily. 30 tablet 6   No current facility-administered medications for this visit.    Allergies  Allergen Reactions  . Codeine Nausea And Vomiting  . Lisinopril Other (See Comments)    "Didn't do well in my body"  . Shellfish Allergy Other (See Comments)    Can tolerate shrimp in 2021 (??)    Social History   Socioeconomic History  . Marital status: Single    Spouse name: Not on file  . Number of children: Not on file  . Years of education: Not on file  . Highest education level: Not on file  Occupational History  . Not on file  Tobacco Use  . Smoking status: Former Smoker    Packs/day: 0.25    Types: Cigarettes    Quit date: 05/14/2020    Years since quitting: 0.3  . Smokeless tobacco: Never Used  Vaping Use  . Vaping Use: Never used  Substance and Sexual Activity  . Alcohol use: Yes    Alcohol/week: 0.0 standard drinks    Comment: occasionally; once every 2 weeks  . Drug use: No  . Sexual activity: Not on file  Other Topics Concern  . Not on file  Social History Narrative  . Not on file   Social Determinants of Health   Financial Resource Strain:   . Difficulty of Paying Living Expenses: Not on file  Food Insecurity:   . Worried About Charity fundraiser in the Last Year: Not on file  . Ran Out of Food in the Last Year: Not on file  Transportation Needs:   . Lack of Transportation (Medical): Not on file  . Lack of Transportation (Non-Medical): Not on file  Physical Activity:   . Days of Exercise per Week: Not on file  . Minutes of Exercise per Session: Not on file  Stress:   . Feeling of Stress : Not on file  Social Connections:   . Frequency of Communication with Friends and Family: Not on file  . Frequency of Social  Gatherings with Friends and Family: Not on file  . Attends Religious Services: Not on file  . Active Member of Clubs or Organizations: Not on file  . Attends Archivist Meetings: Not on file  . Marital Status: Not on file  Intimate Partner Violence:   . Fear of Current or Ex-Partner: Not on file  . Emotionally Abused: Not on file  . Physically Abused: Not on file  . Sexually Abused: Not on file     ROS- All systems are reviewed and negative except as per the HPI above.  Physical Exam: There were no vitals filed for this visit.  GEN- The patient is well appearing ***, alert and oriented x 3 today.   Head- normocephalic, atraumatic Eyes-  Sclera clear, conjunctiva pink Ears- hearing intact Oropharynx- clear Neck- supple  Lungs- Clear to ausculation bilaterally, normal work of breathing Heart- ***Regular rate and rhythm, no murmurs, rubs or gallops  GI- soft, NT, ND, + BS Extremities- no clubbing, cyanosis, or edema MS- no significant deformity or atrophy Skin- no rash or lesion Psych- euthymic mood, full affect Neuro- strength and sensation are intact  Wt Readings from Last 3 Encounters:  09/01/20 91.4 kg  08/21/20 89.9 kg  08/05/20 92.7 kg    EKG today demonstrates ***  Echo 08/15/20 demonstrated  1. Difficult apical windows (angle, foreshortened) COmpared to echo from  September 2021, LVEF appears to be a little lower . Left ventricular  ejection fraction, by estimation, is 25%. The left ventricle demonstrates  global hypokinesis.  2. Right ventricular systolic function was not well visualized.  3. Limited echo.  Echo 06/19/20 demonstrated 1. Left ventricular ejection fraction, by estimation, is 35 to 40%. The  left ventricle has moderately decreased function. The left ventricle  demonstrates global hypokinesis. The left ventricular internal cavity size  was moderately dilated. Left  ventricular diastolic function could not be evaluated. There is severe   hypokinesis of the left ventricular, entire anteroseptal wall and anterior  wall.  2. Right ventricular systolic function is low normal. The right  ventricular size is normal. Tricuspid regurgitation signal is inadequate  for assessing PA pressure.  3. Left atrial size was severely dilated.  4. Right atrial size was mildly dilated.  5. Restricted posterior leaflet with heavy calcification of annular and  posteromedial papillary muscles. The mitral valve is abnormal. Mild mitral  valve regurgitation. Moderate mitral annular calcification.  6. The aortic valve is calcified. There is moderate calcification of the  aortic valve. There is moderate thickening of the aortic valve. Aortic  valve regurgitation is not visualized.  7. The inferior vena cava is dilated in size with >50% respiratory  variability, suggesting right atrial pressure of 8 mmHg.  Epic records are reviewed at length today  CHA2DS2-VASc Score = 3  The patient's score is based upon: CHF History: 1 HTN History: 1 Diabetes History: 0 Stroke History: 0 Vascular Disease History: 0 Age Score: 1 Gender Score: 0   {Confirm that the CHADS2-VASc score is correct.   If not, click here to update score.   Then [facility] your note. :[id number]}   ASSESSMENT AND PLAN: 1. Persistent Atrial Fibrillation (ICD10:  I48.19) The patient's CHA2DS2-VASc score is 3, indicating a 3.2% annual risk of stroke.   S/p DCCV on 08/20/20. Severely dilated LA. *** Continue amiodarone 200 mg daily Continue Toprol 12.5 mg daily Continue Eliquis 5 mg BID Continue digoxin 0.125 mg daily  2. Secondary Hypercoagulable State (XTA56:  D68.69){Click to add to Prob List or Visit Dx  :[id number]} The patient is at significant risk for stroke/thromboembolism based upon his CHA2DS2-VASc Score of 3.  Continue Apixaban (Eliquis).   3. Obesity There is no height or weight on file to calculate BMI. Lifestyle modification was discussed at length including  regular exercise and weight reduction. ***  4. Snoring/suspected obstructive sleep apnea Sleep study per [facility]. ***  5. Chronic systolic CHF Cath with normal coronaries. Suspected tachycardia mediated. No signs or symptoms of fluid overload today. Followed by [facility] ***   Follow up ***   Adline Peals PA-C Afib [facility] [place], [place] [id number] [phone] 09/15/2020 8:23 AM

## 2020-09-18 ENCOUNTER — Encounter (HOSPITAL_COMMUNITY): Payer: Self-pay

## 2020-09-18 ENCOUNTER — Other Ambulatory Visit: Payer: Self-pay

## 2020-09-18 ENCOUNTER — Telehealth (HOSPITAL_COMMUNITY): Payer: Self-pay | Admitting: Pharmacy Technician

## 2020-09-18 ENCOUNTER — Ambulatory Visit (HOSPITAL_COMMUNITY)
Admission: RE | Admit: 2020-09-18 | Discharge: 2020-09-18 | Disposition: A | Discharge status: Home / Self Care | Payer: Medicare HMO | Source: Ambulatory Visit | Attending: Cardiology | Admitting: Cardiology

## 2020-09-18 VITALS — BP 119/84 | HR 68 | Wt 200.0 lb

## 2020-09-18 DIAGNOSIS — Z888 Allergy status to other drugs, medicaments and biological substances status: Secondary | ICD-10-CM | POA: Diagnosis not present

## 2020-09-18 DIAGNOSIS — Z87891 Personal history of nicotine dependence: Secondary | ICD-10-CM | POA: Diagnosis not present

## 2020-09-18 DIAGNOSIS — R0602 Shortness of breath: Secondary | ICD-10-CM | POA: Insufficient documentation

## 2020-09-18 DIAGNOSIS — Z885 Allergy status to narcotic agent status: Secondary | ICD-10-CM | POA: Diagnosis not present

## 2020-09-18 DIAGNOSIS — I4819 Other persistent atrial fibrillation: Secondary | ICD-10-CM | POA: Insufficient documentation

## 2020-09-18 DIAGNOSIS — Z79899 Other long term (current) drug therapy: Secondary | ICD-10-CM | POA: Diagnosis not present

## 2020-09-18 DIAGNOSIS — E669 Obesity, unspecified: Secondary | ICD-10-CM | POA: Insufficient documentation

## 2020-09-18 DIAGNOSIS — I5022 Chronic systolic (congestive) heart failure: Secondary | ICD-10-CM | POA: Diagnosis not present

## 2020-09-18 DIAGNOSIS — I110 Hypertensive heart disease with heart failure: Secondary | ICD-10-CM | POA: Insufficient documentation

## 2020-09-18 DIAGNOSIS — Z7984 Long term (current) use of oral hypoglycemic drugs: Secondary | ICD-10-CM | POA: Insufficient documentation

## 2020-09-18 DIAGNOSIS — I4891 Unspecified atrial fibrillation: Secondary | ICD-10-CM | POA: Insufficient documentation

## 2020-09-18 DIAGNOSIS — Z7901 Long term (current) use of anticoagulants: Secondary | ICD-10-CM | POA: Insufficient documentation

## 2020-09-18 LAB — BASIC METABOLIC PANEL
Anion gap: 11 (ref 5–15)
BUN: 20 mg/dL (ref 8–23)
CO2: 26 mmol/L (ref 22–32)
Calcium: 9.1 mg/dL (ref 8.9–10.3)
Chloride: 102 mmol/L (ref 98–111)
Creatinine, Ser: 1.37 mg/dL — ABNORMAL HIGH (ref 0.61–1.24)
GFR, Estimated: 57 mL/min — ABNORMAL LOW (ref >60)
Glucose, Bld: 95 mg/dL (ref 70–99)
Potassium: 3.9 mmol/L (ref 3.5–5.1)
Sodium: 139 mmol/L (ref 135–145)

## 2020-09-18 LAB — DIGOXIN LEVEL: Digoxin Level: 0.8 ng/mL (ref 0.8–2.0)

## 2020-09-18 LAB — CBC
HCT: 53.5 % — ABNORMAL HIGH (ref 39.0–52.0)
Hemoglobin: 17.7 g/dL — ABNORMAL HIGH (ref 13.0–17.0)
MCH: 27.3 pg (ref 26.0–34.0)
MCHC: 33.1 g/dL (ref 30.0–36.0)
MCV: 82.6 fL (ref 80.0–100.0)
Platelets: 263 10*3/uL (ref 150–400)
RBC: 6.48 MIL/uL — ABNORMAL HIGH (ref 4.22–5.81)
RDW: 17.2 % — ABNORMAL HIGH (ref 11.5–15.5)
WBC: 5.2 10*3/uL (ref 4.0–10.5)
nRBC: 0 % (ref 0.0–0.2)

## 2020-09-18 NOTE — Progress Notes (Signed)
Medication Samples have been provided to the patient.  Drug name: eliquis       Strength: 5 mg        Qty: 56  LOT: [id number]    Exp.Date: 05/2022  Dosing instructions: one tab twice a day  The patient has been instructed regarding the correct time, dose, and frequency of taking this medication, including desired effects and most common side effects.   Garlan Fair M 11:50 AM 09/18/2020

## 2020-09-18 NOTE — Patient Instructions (Signed)
It was great to see you today! No medication changes are needed at this time.   Labs today We will only contact you if something comes back abnormal or we need to make some changes. Otherwise no news is good news!  You have been referred to [facility] -see appointment details  Your physician recommends that you schedule a follow-up appointment in: 4-6 weeks  If you have any questions or concerns before your next appointment please send [place] a message through [place] or call our office at [phone].    TO LEAVE A MESSAGE FOR THE NURSE SELECT OPTION 2, PLEASE LEAVE A MESSAGE INCLUDING: . YOUR NAME . DATE OF BIRTH . CALL BACK NUMBER . REASON FOR CALL**this is important as we prioritize the call backs  YOU WILL RECEIVE A CALL BACK THE SAME DAY AS LONG AS YOU CALL BEFORE 4:00 PM

## 2020-09-18 NOTE — Progress Notes (Signed)
Advanced Heart Failure Clinic Note   Referring Physician: PCP: Rogers Blocker, MD PCP-Cardiologist: Donato Heinz, MD  [facility]: Dr. Haroldine Laws   HPI:  64 y/o AAM w/ h/o obesity, HTN, tobacco use, systolic heart failure and atrial fibrillation.   Admitted 06/2020 with acute onset CHF and afib. Echo showed LVEF of 35-40%. Underwent TEE/DCCV with restoration of sinus rhythm. Also w/ severe LAE at risk for early recurrence. CHADS2-VASc score of 2. Discharged onEliquis, Entresto, Toprol and lasix.   Readmitted 11/21 with recurrent HF and ERAF. EF further reduced down to 20% on repeat echo. Developed shock with AKI and lactic acidosis. Initial Co-ox 44%. CVP 14.  Started on milrinone. Diuresed w/ IV Lasix. Had [facility] showing normal cors and well compensated hemodynamics on milrinone. He was able to wean off milrinone w/ stable Co-ox and underwent repeat TEE/ DCCV back to [facility]. Was discharged home on 11/11/. D/c wt 198 lb.   Seen back for post hospital f/u after discharge and was back in atrial fibrillation w/ resting HR in the upper 90s. Wt was up 3 lb and he was symptomatic w/ exertional dyspnea walking up inclines. Farxiga 10 mg daily was added and he was started on low dose  blocker, Toprol XL 12.5 mg daily. He was referred to Afib clinic but unfortunately missed his appt.  He was also ordered to get sleep study to r/o OSA. This has not yet been completed.   He presents back for f/u. Volume status ok, ReDs Clip 29%. Still SOB w/ activity and remains in Afib but rate is better controlled today in the 60s. BP stable, 119/84.  Tolerating HF meds ok w/o orthostatic symptoms. Denies abnormal bleeding w/ Eliquis.   Review of systems complete and found to be negative unless listed in HPI.      Past Medical History:  Diagnosis Date  . Atrial fibrillation ([place])   . CHF (congestive heart failure) ([place])   . Dyspnea   . Hypertension   . Insomnia     Current Outpatient Medications  Medication  Sig Dispense Refill  . amiodarone (PACERONE) 200 MG tablet Take 1 tablet (200 mg total) by mouth daily. 30 tablet 11  . apixaban (ELIQUIS) 5 MG TABS tablet Take 1 tablet (5 mg total) by mouth 2 (two) times daily. 60 tablet 6  . dapagliflozin propanediol (FARXIGA) 10 MG TABS tablet Take 1 tablet (10 mg total) by mouth daily before breakfast. 30 tablet 11  . digoxin (LANOXIN) 0.125 MG tablet Take 1 tablet (0.125 mg total) by mouth daily. 30 tablet 6  . furosemide (LASIX) 80 MG tablet Take 1 tablet (80 mg total) by mouth daily. 30 tablet 6  . metoprolol succinate (TOPROL XL) 25 MG 24 hr tablet Take 0.5 tablets (12.5 mg total) by mouth daily. 15 tablet 11  . sacubitril-valsartan (ENTRESTO) 24-26 MG Take 1 tablet by mouth 2 (two) times daily. 60 tablet 6  . spironolactone (ALDACTONE) 25 MG tablet Take 1 tablet (25 mg total) by mouth daily. 30 tablet 6   No current facility-administered medications for this encounter.    Allergies  Allergen Reactions  . Codeine Nausea And Vomiting  . Lisinopril Other (See Comments)    "Didn't do well in my body"  . Shellfish Allergy Other (See Comments)    Can tolerate shrimp in 2021 (??)      Social History   Socioeconomic History  . Marital status: Single    Spouse name: Not on file  . Number of  children: Not on file  . Years of education: Not on file  . Highest education level: Not on file  Occupational History  . Not on file  Tobacco Use  . Smoking status: Former Smoker    Packs/day: 0.25    Types: Cigarettes    Quit date: 05/14/2020    Years since quitting: 0.3  . Smokeless tobacco: Never Used  Vaping Use  . Vaping Use: Never used  Substance and Sexual Activity  . Alcohol use: Yes    Alcohol/week: 0.0 standard drinks    Comment: occasionally; once every 2 weeks  . Drug use: No  . Sexual activity: Not on file  Other Topics Concern  . Not on file  Social History Narrative  . Not on file   Social Determinants of Health   Financial  Resource Strain: Not on file  Food Insecurity: Not on file  Transportation Needs: Not on file  Physical Activity: Not on file  Stress: Not on file  Social Connections: Not on file  Intimate Partner Violence: Not on file      Family History  Problem Relation Age of Onset  . Colon cancer Neg Hx     Vitals:   09/18/20 1035  BP: 119/84  Pulse: 68  SpO2: 98%  Weight: 90.7 kg (200 lb)     PHYSICAL EXAM: ReDS Clip 29% General:  Well appearing. No respiratory difficulty HEENT: normal Neck: supple. no JVD. Carotids 2+ bilat; no bruits. No lymphadenopathy or thyromegaly appreciated. Cor: PMI nondisplaced. Irregularly irregular rhythm and regular rate. No rubs, gallops or murmurs. Lungs: clear, no wheezing  Abdomen: soft, nontender, nondistended. No hepatosplenomegaly. No bruits or masses. Good bowel sounds. Extremities: no cyanosis, clubbing, rash, edema Neuro: alert & oriented x 3, cranial nerves grossly intact. moves all 4 extremities w/o difficulty. Affect pleasant.  ECG: Atrial Fibrillation 67 bpm    ASSESSMENT & PLAN:  1. Chronic Systolic HF  - Recent admission 51/02 for a/c systolic heart failure>>Cardiogenic shock requiring milrinone, in setting of Afib w/ RVR.  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   -Volume status ok  on exam. Euvolemic.  ReDS clip 29% - NYHA Class II-III, suspect Afib contributing to symptoms  - Continue lasix 80 mg po daily.  - Continue spiro to 25 mg daily.  - Continue digoxin, 0.125 mg daily. Check Dig level  - Continue low-dose Entresto, 24-26 mg bid   - Continue Farxiga 10 mg daily (hgb A1c 5.9)   - Continue Toprol XL 12.5 mg daily  - Repeat BMP today  - repeat echo ~3 months after med optimization   2. Persistent Atrial Fibrillation  - s/p DC-CV 9/21. Had ERAF>>underwent repeat DCCV 11/21 back to NSR - back in Afib on EKG today, HR 98 bpm  - Continue  amiodarone 200 mg once a day for now - Continue Toprol XL 12.5 mg daily  - Continue eliquis 5 mg bid. Denies abnormal bleeding. Check CBC today  - Refer back to Afib Clinic  - suspect underlying OSA (daytime somnolence and snorning). Arrange sleep study    3. HTN - controlled on current regimen - check BMP today    F/u in 3-4 weeks for further med titration   Lyda Jester, PA-C 09/18/20

## 2020-09-18 NOTE — Progress Notes (Signed)
ReDS Vest / Clip - 09/18/20 1100      ReDS Vest / Clip   Station Marker D    Ruler Value 33    ReDS Value Range Low volume    ReDS Actual Value 29

## 2020-09-18 NOTE — Telephone Encounter (Signed)
Advanced Heart Failure Patient Advocate Encounter   Patient was approved to receive Entresto from [employer]  Patient ID: [id number] Effective dates: 09/11/20 through 10/10/20

## 2020-09-22 ENCOUNTER — Inpatient Hospital Stay (HOSPITAL_COMMUNITY)
Admission: RE | Admit: 2020-09-22 | Discharge: 2020-09-22 | Disposition: A | Discharge status: Home / Self Care | Payer: Medicare HMO | Source: Ambulatory Visit | Attending: Physician Assistant | Admitting: Physician Assistant

## 2020-09-22 ENCOUNTER — Ambulatory Visit (HOSPITAL_COMMUNITY): Payer: Medicare HMO | Admitting: Physician Assistant

## 2020-09-24 MED FILL — FUROSEMIDE 80 MG TAB: 80 | 30 days supply | Qty: 30 | Fill #0

## 2020-09-24 MED FILL — DIGOXIN 0.125 MG TABLET: 125 | 30 days supply | Qty: 30 | Fill #0

## 2020-09-24 MED FILL — SPIRONOLACTONE 25 MG TABLET: 25 | 30 days supply | Qty: 30 | Fill #0

## 2020-09-24 MED FILL — METOPROLOL SUCCINATE ER 25: 25 | 30 days supply | Qty: 15 | Fill #1

## 2020-09-29 ENCOUNTER — Telehealth (HOSPITAL_COMMUNITY): Payer: Self-pay | Admitting: Licensed Clinical Social Worker

## 2020-09-29 NOTE — Telephone Encounter (Signed)
CSW called pt to check in regarding Extra Help application that we completed a month ago.  Pt reports he got letter from [facility] reporting that he was approved for Extra Help- hasn't filled any prescriptions yet so unsure how this will help him but is glad it should reduce the money taken from his check each month.  Pt then inquired about sleep study that was recommended by our office- states he hasn't heard anything since then.  CSW sent message to clinic staff to assist  Will continue to follow and assist as needed  Jorge Ny, [facility] Desk#: [phone] Cell#: [phone]

## 2020-09-30 ENCOUNTER — Telehealth (HOSPITAL_COMMUNITY): Payer: Self-pay | Admitting: Cardiology

## 2020-09-30 ENCOUNTER — Other Ambulatory Visit (HOSPITAL_COMMUNITY): Payer: Self-pay

## 2020-09-30 ENCOUNTER — Other Ambulatory Visit (HOSPITAL_COMMUNITY): Payer: Self-pay | Admitting: Cardiology

## 2020-09-30 DIAGNOSIS — I509 Heart failure, unspecified: Secondary | ICD-10-CM

## 2020-09-30 DIAGNOSIS — Z7901 Long term (current) use of anticoagulants: Secondary | ICD-10-CM

## 2020-09-30 DIAGNOSIS — I4891 Unspecified atrial fibrillation: Secondary | ICD-10-CM

## 2020-09-30 MED ORDER — DIGOXIN 125 MCG PO TABS: 0.0625 mg | ORAL_TABLET | Freq: Every day | ORAL | 3 refills | Status: DC

## 2020-09-30 MED ORDER — SPIRONOLACTONE 25 MG PO TABS: 25.0000 mg | ORAL_TABLET | Freq: Every day | ORAL | 3 refills | Status: DC

## 2020-09-30 MED ORDER — SACUBITRIL-VALSARTAN 24-26 MG PO TABS
1.0000 | ORAL_TABLET | Freq: Two times a day (BID) | ORAL | 3 refills | Status: DC
Start: 2020-09-30 — End: 2020-11-12

## 2020-09-30 MED ORDER — DAPAGLIFLOZIN PROPANEDIOL 10 MG PO TABS: 10.0000 mg | ORAL_TABLET | Freq: Every day | ORAL | 3 refills | Status: DC

## 2020-09-30 MED ORDER — FUROSEMIDE 80 MG PO TABS: 80.0000 mg | ORAL_TABLET | Freq: Every day | ORAL | 3 refills | Status: DC

## 2020-09-30 MED ORDER — APIXABAN 5 MG PO TABS: 5.0000 mg | ORAL_TABLET | Freq: Two times a day (BID) | ORAL | 3 refills | Status: DC

## 2020-09-30 MED ORDER — METOPROLOL SUCCINATE ER 25 MG PO TB24
12.5000 mg | ORAL_TABLET | Freq: Every day | ORAL | 3 refills | Status: DC
Start: 2020-09-30 — End: 2020-10-21

## 2020-09-30 MED ORDER — DIGOXIN 125 MCG PO TABS: 0.1250 mg | ORAL_TABLET | Freq: Every day | ORAL | 3 refills | Status: DC

## 2020-09-30 MED ORDER — AMIODARONE HCL 200 MG PO TABS: 200.0000 mg | ORAL_TABLET | Freq: Every day | ORAL | 3 refills | Status: DC

## 2020-09-30 MED FILL — FARXIGA 10 MG TABLET: 10 | 90 days supply | Qty: 90 | Fill #0

## 2020-09-30 MED FILL — AMIODARONE HCL 200 MG TAB: 200 | 90 days supply | Qty: 90 | Fill #0

## 2020-09-30 MED FILL — ELIQUIS 5 MG TABLET: 5 | 90 days supply | Qty: 180 | Fill #0

## 2020-09-30 MED FILL — ENTRESTO 24 MG-26 MG TABLET: 24-26 | 90 days supply | Qty: 180 | Fill #0

## 2020-09-30 NOTE — Addendum Note (Signed)
Addended by: Malena Edman on: 09/30/2020 09:35 AM   Modules accepted: Orders

## 2020-09-30 NOTE — Telephone Encounter (Signed)
-----   Message from Consuelo Pandy, [place] sent at 09/18/2020  3:36 PM EST ----- Reduce digoxin to 0.0625 mg daily. Repeat dig level in 1 week

## 2020-09-30 NOTE — Telephone Encounter (Signed)
   [phone] (M) pt aware repeat labs will be done at follow up due to limited transportation

## 2020-10-01 ENCOUNTER — Other Ambulatory Visit (HOSPITAL_COMMUNITY): Payer: Self-pay | Admitting: Nurse Practitioner

## 2020-10-01 ENCOUNTER — Other Ambulatory Visit: Payer: Self-pay

## 2020-10-01 ENCOUNTER — Ambulatory Visit (HOSPITAL_COMMUNITY)
Admission: RE | Admit: 2020-10-01 | Discharge: 2020-10-01 | Disposition: A | Discharge status: Home / Self Care | Payer: Medicare HMO | Source: Ambulatory Visit | Attending: Nurse Practitioner | Admitting: Nurse Practitioner

## 2020-10-01 ENCOUNTER — Encounter (HOSPITAL_COMMUNITY): Payer: Self-pay | Admitting: Nurse Practitioner

## 2020-10-01 VITALS — BP 130/80 | HR 72 | Ht 66.0 in | Wt 206.8 lb

## 2020-10-01 DIAGNOSIS — Z79899 Other long term (current) drug therapy: Secondary | ICD-10-CM | POA: Insufficient documentation

## 2020-10-01 DIAGNOSIS — I4819 Other persistent atrial fibrillation: Secondary | ICD-10-CM | POA: Insufficient documentation

## 2020-10-01 DIAGNOSIS — I4891 Unspecified atrial fibrillation: Secondary | ICD-10-CM | POA: Diagnosis not present

## 2020-10-01 DIAGNOSIS — E669 Obesity, unspecified: Secondary | ICD-10-CM | POA: Diagnosis not present

## 2020-10-01 DIAGNOSIS — I5022 Chronic systolic (congestive) heart failure: Secondary | ICD-10-CM | POA: Insufficient documentation

## 2020-10-01 DIAGNOSIS — D6869 Other thrombophilia: Secondary | ICD-10-CM | POA: Diagnosis not present

## 2020-10-01 DIAGNOSIS — Z7901 Long term (current) use of anticoagulants: Secondary | ICD-10-CM | POA: Diagnosis not present

## 2020-10-01 DIAGNOSIS — R0683 Snoring: Secondary | ICD-10-CM | POA: Diagnosis not present

## 2020-10-01 DIAGNOSIS — Z885 Allergy status to narcotic agent status: Secondary | ICD-10-CM | POA: Diagnosis not present

## 2020-10-01 DIAGNOSIS — Z6833 Body mass index (BMI) 33.0-33.9, adult: Secondary | ICD-10-CM | POA: Insufficient documentation

## 2020-10-01 DIAGNOSIS — F17210 Nicotine dependence, cigarettes, uncomplicated: Secondary | ICD-10-CM | POA: Insufficient documentation

## 2020-10-01 LAB — DIGOXIN LEVEL: Digoxin Level: 0.8 ng/mL (ref 0.8–2.0)

## 2020-10-01 NOTE — Progress Notes (Signed)
Primary Care Physician: Gwenyth Bender, MD Primary Cardiologist: Dr Bjorn Pippin  Primary Electrophysiologist: none [facility]: Dr Gala Romney Referring Physician: Boyce Medici   Christopher Wilkins is a 64 y.o. male with a history of HTN, tobacco use, chronic systolic heart failure and atrial fibrillation who presents for consultation in the [facility]. The patient was initially diagnosed with atrial fibrillation 06/2020 after presenting with symptoms of SOB and orthopnea. He was admitted for acute CHF and afib with RVR. He underwent DCCV on 06/20/20. Patient is on Eliquis for a CHADS2VASC score of 3. Echo showed EF 35-40% and severe LAE on 9/9. Patient was readmitted 08/2020 with acute CHF and afib. He was started on amiodarone and had repeat DCCV on 08/20/20. Echo showed EF 20%, LHC showed normal coronaries, suspected tachycardia mediated. On follow up with the [facility], he was back in afib with heart rates 90s. He was referred to the afib clinic. Today he is in SR. He has no complaints.  He feels his fluid is stable. He is following a low salt diet. He is being compliant with meds. He is now on amiodarone 200 mg daily.   Today, he denies symptoms of  palpitations, chest pain, shortness of breath, orthopnea, PND, lower extremity edema, dizziness, presyncope, syncope, bleeding, or neurologic sequela. The patient is tolerating medications without difficulties and is otherwise without complaint today.    Atrial Fibrillation Risk Factors:  he does have symptoms or diagnosis of sleep apnea. he does not have a history of rheumatic fever. he does not have a history of alcohol use. The patient does not have a history of early familial atrial fibrillation or other arrhythmias.  he has a BMI of Body mass index is 33.38 kg/m.Marland Kitchen Filed Weights   10/01/20 0900  Weight: 93.8 kg    Family History  Problem Relation Age of Onset  . Colon cancer Neg Hx      Atrial Fibrillation Management  history:  Previous antiarrhythmic drugs: amiodarone Previous cardioversions: 06/20/20, 08/20/20 Previous ablations: none CHADS2VASC score: 3 Anticoagulation history: Eliquis   Past Medical History:  Diagnosis Date  . Atrial fibrillation (HCC)   . CHF (congestive heart failure) (HCC)   . Dyspnea   . Hypertension   . Insomnia    Past Surgical History:  Procedure Laterality Date  . CARDIOVERSION N/A 06/20/2020   Procedure: CARDIOVERSION;  Surgeon: Jake Bathe, MD;  Location: [facility] ENDOSCOPY;  Service: Cardiovascular;  Laterality: N/A;  . CARDIOVERSION N/A 08/20/2020   Procedure: CARDIOVERSION;  Surgeon: Dolores Patty, MD;  Location: [facility] ENDOSCOPY;  Service: Cardiovascular;  Laterality: N/A;  . CHOLECYSTECTOMY    . OTHER SURGICAL HISTORY     "Shot a couple of times"  . RIGHT/LEFT HEART CATH AND CORONARY ANGIOGRAPHY N/A 08/18/2020   Procedure: RIGHT/LEFT HEART CATH AND CORONARY ANGIOGRAPHY;  Surgeon: Dolores Patty, MD;  Location: [facility] INVASIVE CV LAB;  Service: Cardiovascular;  Laterality: N/A;  . TEE WITHOUT CARDIOVERSION N/A 06/20/2020   Procedure: TRANSESOPHAGEAL ECHOCARDIOGRAM (TEE);  Surgeon: Jake Bathe, MD;  Location: [facility] ENDOSCOPY;  Service: Cardiovascular;  Laterality: N/A;  . TEE WITHOUT CARDIOVERSION N/A 08/20/2020   Procedure: TRANSESOPHAGEAL ECHOCARDIOGRAM (TEE);  Surgeon: Dolores Patty, MD;  Location: [facility] ENDOSCOPY;  Service: Cardiovascular;  Laterality: N/A;    Current Outpatient Medications  Medication Sig Dispense Refill  . amiodarone (PACERONE) 200 MG tablet Take 1 tablet (200 mg total) by mouth daily. 90 tablet 3  . apixaban (ELIQUIS) 5 MG TABS  tablet Take 1 tablet (5 mg total) by mouth 2 (two) times daily. 180 tablet 3  . dapagliflozin propanediol (FARXIGA) 10 MG TABS tablet Take 1 tablet (10 mg total) by mouth daily before breakfast. 90 tablet 3  . digoxin (LANOXIN) 0.125 MG tablet Take 0.5 tablets (0.0625 mg total) by mouth daily. 45 tablet 3  .  furosemide (LASIX) 80 MG tablet Take 1 tablet (80 mg total) by mouth daily. 90 tablet 3  . metoprolol succinate (TOPROL XL) 25 MG 24 hr tablet Take 0.5 tablets (12.5 mg total) by mouth daily. 45 tablet 3  . sacubitril-valsartan (ENTRESTO) 24-26 MG Take 1 tablet by mouth 2 (two) times daily. 180 tablet 3  . spironolactone (ALDACTONE) 25 MG tablet Take 1 tablet (25 mg total) by mouth daily. 90 tablet 3   No current facility-administered medications for this encounter.    Allergies  Allergen Reactions  . Codeine Nausea And Vomiting  . Lisinopril Other (See Comments)    "Didn't do well in my body"  . Shellfish Allergy Other (See Comments)    Can tolerate shrimp in 2021 (??)    Social History   Socioeconomic History  . Marital status: Single    Spouse name: Not on file  . Number of children: Not on file  . Years of education: Not on file  . Highest education level: Not on file  Occupational History  . Not on file  Tobacco Use  . Smoking status: Current Some Day Smoker    Packs/day: 0.25    Types: Cigarettes    Last attempt to quit: 05/14/2020    Years since quitting: 0.3  . Smokeless tobacco: Never Used  . Tobacco comment: 1-2 cigarettes daily  Vaping Use  . Vaping Use: Never used  Substance and Sexual Activity  . Alcohol use: Yes    Alcohol/week: 2.0 standard drinks    Types: 2 Cans of beer per week    Comment: occasionally; once every 2 weeks  . Drug use: No  . Sexual activity: Not on file  Other Topics Concern  . Not on file  Social History Narrative  . Not on file   Social Determinants of Health   Financial Resource Strain: Not on file  Food Insecurity: Not on file  Transportation Needs: Not on file  Physical Activity: Not on file  Stress: Not on file  Social Connections: Not on file  Intimate Partner Violence: Not on file     ROS- All systems are reviewed and negative except as per the HPI above.  Physical Exam: Vitals:   10/01/20 0900  BP: 130/80   Pulse: 72  Weight: 93.8 kg  Height: 5\' 6"  (1.676 m)    GEN- The patient is well appearing, alert and oriented x 3 today.   Head- normocephalic, atraumatic Eyes-  Sclera clear, conjunctiva pink Ears- hearing intact Oropharynx- clear Neck- supple  Lungs- Clear to ausculation bilaterally, normal work of breathing Heart- Regular rate and rhythm, no murmurs, rubs or gallops  GI- soft, NT, ND, + BS Extremities- no clubbing, cyanosis, or edema MS- no significant deformity or atrophy Skin- no rash or lesion Psych- euthymic mood, full affect Neuro- strength and sensation are intact  Wt Readings from Last 3 Encounters:  10/01/20 93.8 kg  09/18/20 90.7 kg  09/01/20 91.4 kg    EKG today demonstrates NSR at 72 bpm, pr int 200 ms, qrs int 96 ms, qtc 432 ms   Echo 08/15/20 demonstrated  1. Difficult apical windows (angle,  foreshortened) COmpared to echo from  September 2021, LVEF appears to be a little lower . Left ventricular  ejection fraction, by estimation, is 25%. The left ventricle demonstrates  global hypokinesis.  2. Right ventricular systolic function was not well visualized.  3. Limited echo.  Echo 06/19/20 demonstrated 1. Left ventricular ejection fraction, by estimation, is 35 to 40%. The  left ventricle has moderately decreased function. The left ventricle  demonstrates global hypokinesis. The left ventricular internal cavity size  was moderately dilated. Left  ventricular diastolic function could not be evaluated. There is severe  hypokinesis of the left ventricular, entire anteroseptal wall and anterior  wall.  2. Right ventricular systolic function is low normal. The right  ventricular size is normal. Tricuspid regurgitation signal is inadequate  for assessing PA pressure.  3. Left atrial size was severely dilated.  4. Right atrial size was mildly dilated.  5. Restricted posterior leaflet with heavy calcification of annular and  posteromedial papillary muscles.  The mitral valve is abnormal. Mild mitral  valve regurgitation. Moderate mitral annular calcification.  6. The aortic valve is calcified. There is moderate calcification of the  aortic valve. There is moderate thickening of the aortic valve. Aortic  valve regurgitation is not visualized.  7. The inferior vena cava is dilated in size with >50% respiratory  variability, suggesting right atrial pressure of 8 mmHg.  Epic records are reviewed at length today  CHA2DS2-VASc Score = 3  The patient's score is based upon: CHF History: Yes HTN History: Yes Diabetes History: No Stroke History: No Vascular Disease History: No Age Score: 1 Gender Score: 0      ASSESSMENT AND PLAN: 1. Persistent Atrial Fibrillation (ICD10:  I48.19) The patient's CHA2DS2-VASc score is 3, indicating a 3.2% annual risk of stroke.   S/p DCCV on 08/20/20. Severely dilated LA. Now in SR  Continue amiodarone 200 mg daily Continue Toprol 12.5 mg daily Continue Eliquis 5 mg BID Continue digoxin 0.125 mg daily 1 week zio patch to confirm SR as he is not aware of when he is in afib   2. Secondary Hypercoagulable State (ICD10:  D68.69) The patient is at significant risk for stroke/thromboembolism based upon his CHA2DS2-VASc Score of 3.  Continue Apixaban (Eliquis).   3. Obesity Body mass index is 33.38 kg/m. Lifestyle modification was discussed at length including regular exercise and weight reduction.   4. Snoring/suspected obstructive sleep apnea Sleep study per [facility].   5. Chronic systolic CHF Cath with normal coronaries. Suspected tachycardia mediated. No signs or symptoms of fluid overload today. Followed by [facility]    Follow up after results of one week monitor are reviewed    Butch Penny C. Lawonda Pretlow, [facility] [address] [place], [place] [address] [phone]

## 2020-10-10 ENCOUNTER — Ambulatory Visit: Payer: PRIVATE HEALTH INSURANCE | Admitting: Family Medicine

## 2020-10-15 ENCOUNTER — Ambulatory Visit: Payer: Medicare HMO | Admitting: Family Medicine

## 2020-10-20 NOTE — Progress Notes (Signed)
Advanced Heart Failure Clinic Note   Referring Physician: PCP: Rogers Blocker, MD PCP-Cardiologist: Donato Heinz, MD  [facility]: Dr. Haroldine Laws   HPI: 65 y/o AAM w/ h/o obesity, HTN, tobacco use, systolic heart failure and atrial fibrillation.   Admitted 06/2020 with acute onset CHF and afib. Echo showed LVEF of 35-40%. Underwent TEE/DCCV with restoration of sinus rhythm. Also w/ severe LAE at risk for early recurrence. CHADS2-VASc score of 2. Discharged onEliquis, Entresto, Toprol and lasix.   Readmitted 11/21 with recurrent HF and ERAF. EF further reduced down to 20% on repeat echo. Developed shock with AKI and lactic acidosis. Initial Co-ox 44%. CVP 14.  Started on milrinone. Diuresed w/ IV Lasix. Had [facility] showing normal cors and well compensated hemodynamics on milrinone. He was able to wean off milrinone w/ stable Co-ox and underwent repeat TEE/ DCCV back to NSR. Was discharged home on 08/21/2020.  D/c wt 198 lb.   Seen back for post hospital f/u after discharge and was back in atrial fibrillation w/ resting HR in the upper 90s. Wt was up 3 lb and he was symptomatic w/ exertional dyspnea walking up inclines. Farxiga 10 mg daily was added and he was started on low dose  blocker, Toprol XL 12.5 mg daily. He was referred to Afib clinic but unfortunately missed his appt.  He was also ordered to get sleep study to r/o OSA. This has not yet been completed.   Today he returns for HF follow up.Overall feeling fine. Denies SOB/PND/Orthopnea. Able to walk around the store without difficulty. Appetite ok. He has been cooking a lot for his family. No fever or chills. Weight at home 206-207 pounds. No bleeding issues. Smoking 3 cigarettes per week. Taking all medications.   ECHO 08/15/2020 EF 25%  Echo 06/2020 EF 30-35% RA severely dilated   Past Medical History:  Diagnosis Date  . Atrial fibrillation ([place])   . CHF (congestive heart failure) ([place])   . Dyspnea   . Hypertension   . Insomnia      Current Outpatient Medications  Medication Sig Dispense Refill  . amiodarone (PACERONE) 200 MG tablet Take 1 tablet (200 mg total) by mouth daily. 90 tablet 3  . apixaban (ELIQUIS) 5 MG TABS tablet Take 1 tablet (5 mg total) by mouth 2 (two) times daily. 180 tablet 3  . dapagliflozin propanediol (FARXIGA) 10 MG TABS tablet Take 1 tablet (10 mg total) by mouth daily before breakfast. 90 tablet 3  . digoxin (LANOXIN) 0.125 MG tablet Take 0.5 tablets (0.0625 mg total) by mouth daily. 45 tablet 3  . furosemide (LASIX) 80 MG tablet Take 1 tablet (80 mg total) by mouth daily. 90 tablet 3  . metoprolol succinate (TOPROL XL) 25 MG 24 hr tablet Take 0.5 tablets (12.5 mg total) by mouth daily. 45 tablet 3  . sacubitril-valsartan (ENTRESTO) 24-26 MG Take 1 tablet by mouth 2 (two) times daily. 180 tablet 3  . spironolactone (ALDACTONE) 25 MG tablet Take 1 tablet (25 mg total) by mouth daily. 90 tablet 3   No current facility-administered medications for this encounter.    Allergies  Allergen Reactions  . Codeine Nausea And Vomiting  . Lisinopril Other (See Comments)    "Didn't do well in my body"  . Shellfish Allergy Other (See Comments)    Can tolerate shrimp in 2021 (??)      Social History   Socioeconomic History  . Marital status: Single    Spouse name: Not on file  .  Number of children: Not on file  . Years of education: Not on file  . Highest education level: Not on file  Occupational History  . Not on file  Tobacco Use  . Smoking status: Current Some Day Smoker    Packs/day: 0.25    Types: Cigarettes    Last attempt to quit: 05/14/2020    Years since quitting: 0.4  . Smokeless tobacco: Never Used  . Tobacco comment: 1-2 cigarettes daily  Vaping Use  . Vaping Use: Never used  Substance and Sexual Activity  . Alcohol use: Yes    Alcohol/week: 2.0 standard drinks    Types: 2 Cans of beer per week    Comment: occasionally; once every 2 weeks  . Drug use: No  . Sexual  activity: Not on file  Other Topics Concern  . Not on file  Social History Narrative  . Not on file   Social Determinants of Health   Financial Resource Strain: Not on file  Food Insecurity: Not on file  Transportation Needs: Not on file  Physical Activity: Not on file  Stress: Not on file  Social Connections: Not on file  Intimate Partner Violence: Not on file      Family History  Problem Relation Age of Onset  . Colon cancer Neg Hx     Vitals:   10/21/20 1338  BP: 122/86  Pulse: 78  SpO2: 99%  Weight: 96.9 kg (213 lb 9.6 oz)   Wt Readings from Last 3 Encounters:  10/21/20 96.9 kg (213 lb 9.6 oz)  10/01/20 93.8 kg (206 lb 12.8 oz)  09/18/20 90.7 kg (200 lb)     PHYSICAL EXAM: General:  Well appearing. No resp difficulty. Walked in the clinic.  HEENT: normal Neck: supple. no JVD. Carotids 2+ bilat; no bruits. No lymphadenopathy or thryomegaly appreciated. Cor: PMI nondisplaced. Regular rate & rhythm. No rubs, gallops or murmurs. Lungs: clear Abdomen: soft, nontender, nondistended. No hepatosplenomegaly. No bruits or masses. Good bowel sounds. Extremities: no cyanosis, clubbing, rash, edema Neuro: alert & orientedx3, cranial nerves grossly intact. moves all 4 extremities w/o difficulty. Affect pleasant  EKG: SR 77 bpm    ASSESSMENT & PLAN:  1. Chronic Systolic HF  - Recent admission 88/41 for a/c systolic heart failure>>Cardiogenic shock requiring milrinone, in setting of Afib w/ RVR.  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  -NYHA II. Volume status stable despite weight gain.  - Continue lasix 80 mg po daily.  - Continue spiro to 25 mg daily.  - Continue digoxin, 0.0625 mg daily. Check dig level today.  - Continue low-dose Entresto, 24-26 mg bid   - Continue Farxiga 10 mg daily (hgb A1c 5.9)   - Increase Toprol XL to 25 mg daily.   - repeat echo ~3 months after med  optimization   2. Persistent Atrial Fibrillation  - s/p DC-CV 9/21. Had ERAF>>underwent repeat DCCV 11/21 back to NSR - Continue amiodarone 200 mg once a day for now - Increase Toprol XL 25 mg daily.  - Continue eliquis 5 mg bid. Denies abnormal bleeding.  - suspect underlying OSA (daytime somnolence and snorning). Set up home sleep study.    3. HTN - Controlled.   Follow up in 6 week with Dr Haroldine Laws and an ECHO. Check BMET/dig level today. Discussed medications changes. Discussed  Home sleep study.   Darrick Grinder, NP 10/21/20

## 2020-10-21 ENCOUNTER — Ambulatory Visit (HOSPITAL_COMMUNITY)
Admission: RE | Admit: 2020-10-21 | Discharge: 2020-10-21 | Disposition: A | Discharge status: Home / Self Care | Payer: Medicare HMO | Source: Ambulatory Visit | Attending: Adult Health | Admitting: Adult Health

## 2020-10-21 ENCOUNTER — Encounter (HOSPITAL_COMMUNITY): Payer: Self-pay

## 2020-10-21 ENCOUNTER — Other Ambulatory Visit (HOSPITAL_COMMUNITY): Payer: Self-pay | Admitting: Adult Health

## 2020-10-21 ENCOUNTER — Other Ambulatory Visit: Payer: Self-pay

## 2020-10-21 VITALS — BP 122/86 | HR 78 | Wt 213.6 lb

## 2020-10-21 DIAGNOSIS — Z885 Allergy status to narcotic agent status: Secondary | ICD-10-CM | POA: Insufficient documentation

## 2020-10-21 DIAGNOSIS — I110 Hypertensive heart disease with heart failure: Secondary | ICD-10-CM | POA: Insufficient documentation

## 2020-10-21 DIAGNOSIS — Z7984 Long term (current) use of oral hypoglycemic drugs: Secondary | ICD-10-CM | POA: Diagnosis not present

## 2020-10-21 DIAGNOSIS — Z7901 Long term (current) use of anticoagulants: Secondary | ICD-10-CM | POA: Insufficient documentation

## 2020-10-21 DIAGNOSIS — I5022 Chronic systolic (congestive) heart failure: Secondary | ICD-10-CM | POA: Insufficient documentation

## 2020-10-21 DIAGNOSIS — Z91013 Allergy to seafood: Secondary | ICD-10-CM | POA: Diagnosis not present

## 2020-10-21 DIAGNOSIS — I10 Essential (primary) hypertension: Secondary | ICD-10-CM | POA: Diagnosis not present

## 2020-10-21 DIAGNOSIS — I5032 Chronic diastolic (congestive) heart failure: Secondary | ICD-10-CM | POA: Diagnosis not present

## 2020-10-21 DIAGNOSIS — Z888 Allergy status to other drugs, medicaments and biological substances status: Secondary | ICD-10-CM | POA: Insufficient documentation

## 2020-10-21 DIAGNOSIS — I4819 Other persistent atrial fibrillation: Secondary | ICD-10-CM | POA: Insufficient documentation

## 2020-10-21 DIAGNOSIS — Z79899 Other long term (current) drug therapy: Secondary | ICD-10-CM | POA: Insufficient documentation

## 2020-10-21 DIAGNOSIS — I480 Paroxysmal atrial fibrillation: Secondary | ICD-10-CM | POA: Diagnosis not present

## 2020-10-21 LAB — DIGOXIN LEVEL: Digoxin Level: 0.3 ng/mL — ABNORMAL LOW (ref 0.8–2.0)

## 2020-10-21 LAB — BASIC METABOLIC PANEL
Anion gap: 12 (ref 5–15)
BUN: 27 mg/dL — ABNORMAL HIGH (ref 8–23)
CO2: 23 mmol/L (ref 22–32)
Calcium: 8.8 mg/dL — ABNORMAL LOW (ref 8.9–10.3)
Chloride: 102 mmol/L (ref 98–111)
Creatinine, Ser: 1.37 mg/dL — ABNORMAL HIGH (ref 0.61–1.24)
GFR, Estimated: 57 mL/min — ABNORMAL LOW (ref >60)
Glucose, Bld: 124 mg/dL — ABNORMAL HIGH (ref 70–99)
Potassium: 4.5 mmol/L (ref 3.5–5.1)
Sodium: 137 mmol/L (ref 135–145)

## 2020-10-21 MED ORDER — METOPROLOL SUCCINATE ER 25 MG PO TB24: 25.0000 mg | ORAL_TABLET | Freq: Every day | ORAL | 3 refills | Status: DC

## 2020-10-21 MED FILL — METOPROLOL SUCCINATE ER 25: 25 | 90 days supply | Qty: 90 | Fill #0

## 2020-10-21 NOTE — Patient Instructions (Signed)
Increase Toprol XL to 25 mg, one tab daily  Labs today We will only contact you if something comes back abnormal or we need to make some changes. Otherwise no news is good news!  Your physician recommends that you schedule a follow-up appointment in: 6-8 weeks with Dr Haroldine Laws and echo  Your physician has requested that you have an echocardiogram. Echocardiography is a painless test that uses sound waves to create images of your heart. It provides your doctor with information about the size and shape of your heart and how well your heart's chambers and valves are working. This procedure takes approximately one hour. There are no restrictions for this procedure.   If you have any questions or concerns before your next appointment please send [place] a message through [place] or call our office at [phone].    TO LEAVE A MESSAGE FOR THE NURSE SELECT OPTION 2, PLEASE LEAVE A MESSAGE INCLUDING: . YOUR NAME . DATE OF BIRTH . CALL BACK NUMBER . REASON FOR CALL**this is important as we prioritize the call backs  YOU WILL RECEIVE A CALL BACK THE SAME DAY AS LONG AS YOU CALL BEFORE 4:00 PM

## 2020-10-27 MED FILL — FUROSEMIDE 80 MG TAB: 80 | 30 days supply | Qty: 30 | Fill #1

## 2020-10-29 ENCOUNTER — Encounter: Payer: Self-pay | Admitting: Nurse Practitioner

## 2020-10-29 ENCOUNTER — Other Ambulatory Visit: Payer: Self-pay

## 2020-10-29 ENCOUNTER — Ambulatory Visit: Payer: Medicare HMO | Attending: Nurse Practitioner | Admitting: Nurse Practitioner

## 2020-10-29 DIAGNOSIS — I10 Essential (primary) hypertension: Secondary | ICD-10-CM | POA: Diagnosis not present

## 2020-10-29 DIAGNOSIS — R7303 Prediabetes: Secondary | ICD-10-CM

## 2020-10-29 DIAGNOSIS — Z7689 Persons encountering health services in other specified circumstances: Secondary | ICD-10-CM

## 2020-10-29 DIAGNOSIS — I509 Heart failure, unspecified: Secondary | ICD-10-CM

## 2020-10-29 DIAGNOSIS — E785 Hyperlipidemia, unspecified: Secondary | ICD-10-CM

## 2020-10-29 MED ORDER — BLOOD PRESSURE MONITOR DEVI: 0 refills | Status: DC

## 2020-10-29 MED ORDER — ATORVASTATIN CALCIUM 40 MG PO TABS: 40.0000 mg | ORAL_TABLET | Freq: Every day | ORAL | 3 refills | Status: DC

## 2020-10-29 MED ORDER — GNP DIGITAL WEIGHT SCALE MISC: 1.0000 | Freq: Every day | 0 refills | Status: AC

## 2020-10-29 NOTE — Progress Notes (Signed)
Virtual Visit via Telephone Note Due to national recommendations of social distancing due to [place] 19, telehealth visit is felt to be most appropriate for this patient at this time.  I discussed the limitations, risks, security and privacy concerns of performing an evaluation and management service by telephone and the availability of in person appointments. I also discussed with the patient that there may be a patient responsible charge related to this service. The patient expressed understanding and agreed to proceed.    I connected with [name] on 10/29/20  at   1:30 PM EST  EDT by telephone and verified that I am speaking with the correct person using two identifiers.   Consent I discussed the limitations, risks, security and privacy concerns of performing an evaluation and management service by telephone and the availability of in person appointments. I also discussed with the patient that there may be a patient responsible charge related to this service. The patient expressed understanding and agreed to proceed.   Location of Patient: Private Residence   Location of Provider: [place] participating in Telemedicine visit: Geryl Rankins FNP-BC [place]    History of Present Illness: Telemedicine visit for: 3 month follow up. This is a previous patient of Dr. Siri Cole that is establishing care with me today.  has a past medical history of Atrial fibrillation ([place]), CHF (congestive heart failure) ([place]), Dyspnea, Hypertension, Insomnia, and Prediabetes.   Seeing Cardiology for chronic systolic HF and PAF (S/P successful TEE/DCCV ) Taking Eliquis, Entresto, Toprol, Lasix.  I have ordered him a scale today through his insurance plan.    Essential Hypertension Well controlled. He does not monitor his blood pressure at home. Will also order a device through his insurance plan. Smokes 3 cigarettes per week now. Cutting  back. Used to smoke 1.5-2 ppd. Denies chest pain, shortness of breath, palpitations, lightheadedness, dizziness, headaches or BLE edema.  BP Readings from Last 3 Encounters:  10/21/20 122/86  10/01/20 130/80  09/18/20 119/84  Currently taking farxiga 10 mg daily.  Lab Results  Component Value Date   HGBA1C 5.9 (H) 08/17/2020   LDL not at goal. Started on atorvastatin 40 mg daily Lab Results  Component Value Date   LDLCALC 116 (H) 06/19/2020  The 10-year ASCVD risk score Mikey Bussing DC Jr., et al., 2013) is: 22.7%   Values used to calculate the score:     Age: 49 years     Sex: Male     Is Non-Hispanic African American: Yes     Diabetic: No     Tobacco smoker: Yes     Systolic Blood Pressure: 119 mmHg     Is BP treated: Yes     HDL Cholesterol: 58 mg/dL     Total Cholesterol: 183 mg/dL  Past Medical History:  Diagnosis Date  . Atrial fibrillation ([place])   . CHF (congestive heart failure) ([place])   . Dyspnea   . Hypertension   . Insomnia   . Prediabetes     Past Surgical History:  Procedure Laterality Date  . CARDIOVERSION N/A 06/20/2020   Procedure: CARDIOVERSION;  Surgeon: Jerline Pain, MD;  Location: [facility] ENDOSCOPY;  Service: Cardiovascular;  Laterality: N/A;  . CARDIOVERSION N/A 08/20/2020   Procedure: CARDIOVERSION;  Surgeon: [name], MD;  Location: [facility] ENDOSCOPY;  Service: Cardiovascular;  Laterality: N/A;  . CHOLECYSTECTOMY    . OTHER SURGICAL HISTORY     "Shot a  couple of times"  . RIGHT/LEFT HEART CATH AND CORONARY ANGIOGRAPHY N/A 08/18/2020   Procedure: RIGHT/LEFT HEART CATH AND CORONARY ANGIOGRAPHY;  Surgeon: [name], MD;  Location: [facility] CV LAB;  Service: Cardiovascular;  Laterality: N/A;  . TEE WITHOUT CARDIOVERSION N/A 06/20/2020   Procedure: TRANSESOPHAGEAL ECHOCARDIOGRAM (TEE);  Surgeon: Jerline Pain, MD;  Location[facility] ENDOSCOPY;  Service: Cardiovascular;  Laterality: N/A;  . TEE WITHOUT CARDIOVERSION N/A 08/20/2020   Procedure:  TRANSESOPHAGEAL ECHOCARDIOGRAM (TEE);  Surgeon: [name], MD;  Location: [facility] ENDOSCOPY;  Service: Cardiovascular;  Laterality: N/A;    Family History  Problem Relation Age of Onset  . Colon cancer Neg Hx   . Depression Neg Hx     Social History   Socioeconomic History  . Marital status: Single    Spouse name: Not on file  . Number of children: Not on file  . Years of education: Not on file  . Highest education level: Not on file  Occupational History  . Not on file  Tobacco Use  . Smoking status: Current Some Day Smoker    Packs/day: 0.25    Types: Cigarettes    Last attempt to quit: 05/14/2020    Years since quitting: 0.4  . Smokeless tobacco: Never Used  . Tobacco comment: 1-2 cigarettes daily  Vaping Use  . Vaping Use: Never used  Substance and Sexual Activity  . Alcohol use: Yes    Alcohol/week: 2.0 standard drinks    Types: 2 Cans of beer per week    Comment: occasionally; once every 2 weeks  . Drug use: No  . Sexual activity: Not on file  Other Topics Concern  . Not on file  Social History Narrative  . Not on file   Social Determinants of Health   Financial Resource Strain: Not on file  Food Insecurity: Not on file  Transportation Needs: Not on file  Physical Activity: Not on file  Stress: Not on file  Social Connections: Not on file     Observations/Objective: Awake, alert and oriented x 3   Review of Systems  Constitutional: Negative for fever, malaise/fatigue and weight loss.  HENT: Negative.  Negative for nosebleeds.   Eyes: Negative.  Negative for blurred vision, double vision and photophobia.  Respiratory: Negative.  Negative for cough and shortness of breath.   Cardiovascular: Negative.  Negative for chest pain, palpitations and leg swelling.  Gastrointestinal: Negative.  Negative for heartburn, nausea and vomiting.  Musculoskeletal: Negative.  Negative for myalgias.  Neurological: Negative.  Negative for dizziness, focal weakness,  seizures and headaches.  Psychiatric/Behavioral: Negative.  Negative for suicidal ideas.    Assessment and Plan: Christopher Wilkins was seen today for follow-up.  Diagnoses and all orders for this visit:  Encounter to establish care  Chronic congestive heart failure, unspecified heart failure type ([place]) -     Misc. Devices (GNP DIGITAL WEIGHT SCALE) MISC; 1 Device by Does not apply route daily. Please provide patient with insurance approved scale ICD110 I50.2  Essential hypertension -     Blood Pressure Monitor DEVI; Please provide patient with insurance approved blood pressure monitor I10.0 Continue all antihypertensives as prescribed.  Remember to bring in your blood pressure log with you for your follow up appointment.  DASH/Mediterranean Diets are healthier choices for HTN.    Prediabetes Continue blood sugar control as discussed in office today, low carbohydrate diet, and regular physical exercise as tolerated, 150 minutes per week (30 min each day, 5 days per week,  or 50 min 3 days per week).    Dyslipidemia, goal LDL below 70 -     atorvastatin (LIPITOR) 40 MG tablet; Take 1 tablet (40 mg total) by mouth daily. INSTRUCTIONS: Work on a low fat, heart healthy diet and participate in regular aerobic exercise program by working out at least 150 minutes per week; 5 days a week-30 minutes per day. Avoid red meat/beef/steak,  fried foods. junk foods, sodas, sugary drinks, unhealthy snacking, alcohol and smoking.  Drink at least 80 oz of water per day and monitor your carbohydrate intake daily.       Follow Up Instructions Return in about 6 weeks (around 12/10/2020).     I discussed the assessment and treatment plan with the patient. The patient was provided an opportunity to ask questions and all were answered. The patient agreed with the plan and demonstrated an understanding of the instructions.   The patient was advised to call back or seek an in-person evaluation if the symptoms worsen or  if the condition fails to improve as anticipated.  I provided 17 minutes of non-face-to-face time during this encounter including median intraservice time, reviewing previous notes, labs, imaging, medications and explaining diagnosis and management.  [name], FNP-BC

## 2020-10-31 ENCOUNTER — Telehealth: Payer: Self-pay | Admitting: Nurse Practitioner

## 2020-10-31 NOTE — Telephone Encounter (Signed)
Pt reports getting the J&J vaccine. I have scheduled Christopher Wilkins for a booster on 11/04/20.

## 2020-11-04 ENCOUNTER — Ambulatory Visit: Payer: Medicare HMO | Attending: Internal Medicine

## 2020-11-04 DIAGNOSIS — Z23 Encounter for immunization: Secondary | ICD-10-CM

## 2020-11-04 NOTE — Progress Notes (Signed)
   Covid-19 Vaccination Clinic  Name:  Christopher Wilkins    MRN: [id number] DOB: [birth year]  11/04/2020  [name] was observed post Covid-19 immunization for 30 minutes based on pre-vaccination screening without incident. He was provided with Vaccine Information Sheet and instruction to access the V-Safe system.   [name] was instructed to call 911 with any severe reactions post vaccine: Marland Kitchen Difficulty breathing  . Swelling of face and throat  . A fast heartbeat  . A bad rash all over body  . Dizziness and weakness   Immunizations Administered    Name Date Dose VIS Date Route   Moderna Covid-19 Booster Vaccine 11/04/2020  2:49 PM 0.25 mL 07/30/2020 Intramuscular   Manufacturer: Moderna   Lot: [id number]   [place]: [id number]

## 2020-11-10 MED FILL — ELIQUIS 5 MG TABLET: 5 | 30 days supply | Qty: 60 | Fill #1

## 2020-11-10 MED FILL — DIGOXIN 0.125 MG TABLET: 125 | 30 days supply | Qty: 30 | Fill #1

## 2020-11-12 ENCOUNTER — Other Ambulatory Visit (HOSPITAL_COMMUNITY): Payer: Medicare HMO

## 2020-11-12 ENCOUNTER — Telehealth: Payer: Self-pay | Admitting: Nurse Practitioner

## 2020-11-12 ENCOUNTER — Other Ambulatory Visit (HOSPITAL_COMMUNITY): Payer: Self-pay | Admitting: Emergency Medicine

## 2020-11-12 ENCOUNTER — Encounter (HOSPITAL_COMMUNITY): Payer: Medicare HMO | Admitting: Internal Medicine

## 2020-11-12 ENCOUNTER — Emergency Department (HOSPITAL_COMMUNITY): Payer: Medicare HMO

## 2020-11-12 ENCOUNTER — Other Ambulatory Visit: Payer: Self-pay

## 2020-11-12 ENCOUNTER — Emergency Department (HOSPITAL_COMMUNITY)
Admission: EM | Admit: 2020-11-12 | Discharge: 2020-11-12 | Disposition: A | Discharge status: Home / Self Care | Payer: Medicare HMO | Attending: Emergency Medicine | Admitting: Emergency Medicine

## 2020-11-12 ENCOUNTER — Emergency Department (HOSPITAL_BASED_OUTPATIENT_CLINIC_OR_DEPARTMENT_OTHER): Payer: Medicare HMO

## 2020-11-12 ENCOUNTER — Encounter (HOSPITAL_COMMUNITY): Payer: Self-pay

## 2020-11-12 DIAGNOSIS — S40011A Contusion of right shoulder, initial encounter: Secondary | ICD-10-CM

## 2020-11-12 DIAGNOSIS — F17210 Nicotine dependence, cigarettes, uncomplicated: Secondary | ICD-10-CM | POA: Insufficient documentation

## 2020-11-12 DIAGNOSIS — Z79899 Other long term (current) drug therapy: Secondary | ICD-10-CM | POA: Diagnosis not present

## 2020-11-12 DIAGNOSIS — I4891 Unspecified atrial fibrillation: Secondary | ICD-10-CM

## 2020-11-12 DIAGNOSIS — R0602 Shortness of breath: Secondary | ICD-10-CM

## 2020-11-12 DIAGNOSIS — R609 Edema, unspecified: Secondary | ICD-10-CM

## 2020-11-12 DIAGNOSIS — E785 Hyperlipidemia, unspecified: Secondary | ICD-10-CM | POA: Diagnosis not present

## 2020-11-12 DIAGNOSIS — Z7984 Long term (current) use of oral hypoglycemic drugs: Secondary | ICD-10-CM | POA: Diagnosis not present

## 2020-11-12 DIAGNOSIS — I110 Hypertensive heart disease with heart failure: Secondary | ICD-10-CM | POA: Insufficient documentation

## 2020-11-12 DIAGNOSIS — R2240 Localized swelling, mass and lump, unspecified lower limb: Secondary | ICD-10-CM | POA: Diagnosis not present

## 2020-11-12 DIAGNOSIS — Z7901 Long term (current) use of anticoagulants: Secondary | ICD-10-CM | POA: Diagnosis not present

## 2020-11-12 DIAGNOSIS — I5089 Other heart failure: Secondary | ICD-10-CM | POA: Diagnosis not present

## 2020-11-12 DIAGNOSIS — M79606 Pain in leg, unspecified: Secondary | ICD-10-CM | POA: Diagnosis present

## 2020-11-12 DIAGNOSIS — M25511 Pain in right shoulder: Secondary | ICD-10-CM | POA: Diagnosis present

## 2020-11-12 DIAGNOSIS — R7303 Prediabetes: Secondary | ICD-10-CM | POA: Diagnosis not present

## 2020-11-12 DIAGNOSIS — J219 Acute bronchiolitis, unspecified: Secondary | ICD-10-CM

## 2020-11-12 LAB — BASIC METABOLIC PANEL
Anion gap: 13 (ref 5–15)
BUN: 19 mg/dL (ref 8–23)
CO2: 24 mmol/L (ref 22–32)
Calcium: 8.5 mg/dL — ABNORMAL LOW (ref 8.9–10.3)
Chloride: 101 mmol/L (ref 98–111)
Creatinine, Ser: 1.43 mg/dL — ABNORMAL HIGH (ref 0.61–1.24)
GFR, Estimated: 54 mL/min — ABNORMAL LOW (ref >60)
Glucose, Bld: 73 mg/dL (ref 70–99)
Potassium: 3.6 mmol/L (ref 3.5–5.1)
Sodium: 138 mmol/L (ref 135–145)

## 2020-11-12 LAB — TROPONIN I (HIGH SENSITIVITY): Troponin I (High Sensitivity): 23 ng/L — ABNORMAL HIGH (ref <18)

## 2020-11-12 LAB — BRAIN NATRIURETIC PEPTIDE: B Natriuretic Peptide: 69 pg/mL (ref 0.0–100.0)

## 2020-11-12 LAB — CBC
HCT: 45.5 % (ref 39.0–52.0)
Hemoglobin: 14.8 g/dL (ref 13.0–17.0)
MCH: 27.6 pg (ref 26.0–34.0)
MCHC: 32.5 g/dL (ref 30.0–36.0)
MCV: 84.9 fL (ref 80.0–100.0)
Platelets: 258 10*3/uL (ref 150–400)
RBC: 5.36 MIL/uL (ref 4.22–5.81)
RDW: 18.3 % — ABNORMAL HIGH (ref 11.5–15.5)
WBC: 14.4 10*3/uL — ABNORMAL HIGH (ref 4.0–10.5)
nRBC: 0 % (ref 0.0–0.2)

## 2020-11-12 MED ORDER — DAPAGLIFLOZIN PROPANEDIOL 10 MG PO TABS: 10.0000 mg | ORAL_TABLET | Freq: Every day | ORAL | 0 refills | Status: DC

## 2020-11-12 MED ORDER — METOPROLOL SUCCINATE ER 25 MG PO TB24
25.0000 mg | ORAL_TABLET | Freq: Every day | ORAL | 0 refills | Status: DC
Start: 2020-11-12 — End: 2020-11-12

## 2020-11-12 MED ORDER — APIXABAN 5 MG PO TABS
5.0000 mg | ORAL_TABLET | Freq: Once | ORAL | Status: AC
  Administered 2020-11-12: 5 mg via ORAL
  Filled 2020-11-12: qty 1

## 2020-11-12 MED ORDER — DIGOXIN 125 MCG PO TABS
0.1250 mg | ORAL_TABLET | Freq: Once | ORAL | Status: AC
  Administered 2020-11-12: 0.125 mg via ORAL
  Filled 2020-11-12: qty 1

## 2020-11-12 MED ORDER — DOXYCYCLINE HYCLATE 100 MG PO CAPS: 100.0000 mg | ORAL_CAPSULE | Freq: Two times a day (BID) | ORAL | 0 refills | Status: DC

## 2020-11-12 MED ORDER — AMIODARONE HCL 200 MG PO TABS: 200.0000 mg | ORAL_TABLET | Freq: Every day | ORAL | 0 refills | Status: DC

## 2020-11-12 MED ORDER — ATORVASTATIN CALCIUM 40 MG PO TABS: 40.0000 mg | ORAL_TABLET | Freq: Every day | ORAL | 0 refills | Status: DC

## 2020-11-12 MED ORDER — SPIRONOLACTONE 25 MG PO TABS
25.0000 mg | ORAL_TABLET | Freq: Every day | ORAL | Status: DC
  Administered 2020-11-12: 25 mg via ORAL
  Filled 2020-11-12 (×2): qty 1

## 2020-11-12 MED ORDER — SACUBITRIL-VALSARTAN 24-26 MG PO TABS: 1.0000 | ORAL_TABLET | Freq: Two times a day (BID) | ORAL | 0 refills | Status: DC

## 2020-11-12 MED ORDER — SPIRONOLACTONE 25 MG PO TABS: 25.0000 mg | ORAL_TABLET | Freq: Every day | ORAL | 0 refills | Status: DC

## 2020-11-12 MED ORDER — FUROSEMIDE 20 MG PO TABS
80.0000 mg | ORAL_TABLET | Freq: Once | ORAL | Status: AC
  Administered 2020-11-12: 80 mg via ORAL
  Filled 2020-11-12: qty 4

## 2020-11-12 MED ORDER — DOXYCYCLINE HYCLATE 100 MG PO TABS
100.0000 mg | ORAL_TABLET | Freq: Once | ORAL | Status: AC
  Administered 2020-11-12: 100 mg via ORAL
  Filled 2020-11-12: qty 1

## 2020-11-12 MED ORDER — AMIODARONE HCL 200 MG PO TABS
200.0000 mg | ORAL_TABLET | Freq: Every day | ORAL | Status: DC
  Administered 2020-11-12: 200 mg via ORAL
  Filled 2020-11-12: qty 1

## 2020-11-12 MED ORDER — DIGOXIN 125 MCG PO TABS: 0.0625 mg | ORAL_TABLET | Freq: Every day | ORAL | 0 refills | Status: DC

## 2020-11-12 MED ORDER — FUROSEMIDE 80 MG PO TABS: 80.0000 mg | ORAL_TABLET | Freq: Every day | ORAL | 0 refills | Status: DC

## 2020-11-12 MED ORDER — APIXABAN 5 MG PO TABS: 5.0000 mg | ORAL_TABLET | Freq: Two times a day (BID) | ORAL | 0 refills | Status: DC

## 2020-11-12 MED FILL — ATORVASTATIN CALCIUM 40 MG: 40 | 30 days supply | Qty: 30 | Fill #0 | Status: TO

## 2020-11-12 MED FILL — SPIRONOLACTONE 25 MG TABLET: 25 | 30 days supply | Qty: 30 | Fill #0

## 2020-11-12 NOTE — Progress Notes (Signed)
Lower extremity venous has been completed.   Preliminary results in CV Proc.   Abram Sander 11/12/2020 3:07 PM

## 2020-11-12 NOTE — ED Notes (Signed)
Ortho Tech called @ [phone]-Brenda, RN called by Levada Dy

## 2020-11-12 NOTE — ED Notes (Addendum)
Registration up to desk stating that patient is still here and was just sleeping in the corner. Pt moved aback into waiting area and vital signs retaken.

## 2020-11-12 NOTE — Discharge Instructions (Signed)
Please read and follow all provided instructions.  Your diagnoses today include:  1. Acute bronchiolitis due to unspecified organism   2. Atrial fibrillation with RVR ([place])   3. Anticoagulant long-term use   4. Dyslipidemia, goal LDL below 70   5. Shortness of breath   6. Contusion of right shoulder, initial encounter     Tests performed today include:  CT of the chest -shows infection in the left lower lung  X-ray of the shoulder -no problems  Ultrasound of the legs-no blood clots Blood cell counts (white, red, and platelets) - high white blood cell count Electrolytes  Kidney function test  Vital signs. See below for your results today.   Medications prescribed:   Doxycycline - antibiotic  You have been prescribed an antibiotic medicine: take the entire course of medicine even if you are feeling better. Stopping early can cause the antibiotic not to work.  Take any prescribed medications only as directed.  Home care instructions:  Follow any educational materials contained in this packet.  BE VERY CAREFUL not to take multiple medicines containing Tylenol (also called acetaminophen). Doing so can lead to an overdose which can damage your liver and cause liver failure and possibly death.   Follow-up instructions: Please follow-up with your primary care provider in the next 3 days for further evaluation of your symptoms.   Return instructions:   Please return to the Emergency Department if you experience worsening symptoms.   Please return if you have any other emergent concerns.  Additional Information:  Your vital signs today were: BP (!) 152/82 (BP Location: Right Arm)   Pulse 73   Temp 98 F (36.7 C) (Oral)   Resp 12   Ht 5\' 6"  (1.676 m)   Wt 100.7 kg   SpO2 99%   BMI 35.83 kg/m  If your blood pressure (BP) was elevated above 135/85 this visit, please have this repeated by your doctor within one month. --------------

## 2020-11-12 NOTE — Care Management (Signed)
ED CM consulted to assist patient with obtaining medication, Patient states he does not have med because he did not have transportation. ED RNCM attempted to have [facility] fill prescriptions, but was informed that it was too early, except for spirolactone which was filled.  ED RNCM contacted the [facility] pharmacy and was informed that prescriptions Digoxin and Eliquis were waiting to be picked up.  CM explained that I would come over to pick up meds, but  when [facility] arrived pharmacy was closed.  Patient was not able to obtain medications.  Patient was encouraged to contact the [facility] pharmacy in the am and RNCM will also notify [place] CM.

## 2020-11-12 NOTE — ED Notes (Signed)
Pt name called 3 times with no answer.

## 2020-11-12 NOTE — ED Provider Notes (Signed)
5:52 PM Signout from [facility] PA-C/Dr. Regenia Skeeter at shift change --here with lower extremity swelling, cough and shortness of breath over the past several days.  Patient was evaluated.  Currently awaiting CT of the lung to evaluate pulmonary nodule seen on x-ray.  Also waiting for x-ray of the right shoulder as he has had pain after a fall in the waiting room apparently.  CT shows left lower lobe bronchiolitis and bony change which was the cause of the nodule seen on previous x-ray.  X-ray of the shoulder is negative for fracture.  Transitions of care is seen the patient and is making arrangements for him to get his medication.  Will likely need [facility] member [facility]  Will start patient on doxycycline and check Covid test for infectious bronchiolitis seen on chest CT and elevated white blood cell count along with new cough for the past 3 or 4 days.  Otherwise, patient is clear for discharged home.  He will continue medications and follow-up with his cardiologist/PCP.  Encouraged return to the emergency department with worsening shortness of breath or trouble breathing, development of fever, chest pain, new symptoms or other concerns.  BP (!) 152/82 (BP Location: Right Arm)   Pulse 73   Temp 98 F (36.7 C) (Oral)   Resp 12   Ht 5\' 6"  (1.676 m)   Wt 100.7 kg   SpO2 99%   BMI 35.83 kg/m     Christopher Cater, PA-C 11/12/20 1755    Drenda Freeze, MD 11/12/20 [phone]

## 2020-11-12 NOTE — Progress Notes (Signed)
Orthopedic Tech Progress Note Patient Details:  Christopher Wilkins 1956/02/14 [id number]  Ortho Devices Type of Ortho Device: Arm sling Ortho Device/Splint Location: RUE Ortho Device/Splint Interventions: Application   Post Interventions Patient Tolerated: Well Instructions Provided: Adjustment of device   [name] E [name] 11/12/2020, 6:14 PM

## 2020-11-12 NOTE — ED Provider Notes (Signed)
[facility] EMERGENCY DEPARTMENT Provider Note   CSN: [id number] Arrival date & time: 11/12/20  0865     History Chief Complaint  Patient presents with  . Shortness of Breath  . Leg Pain    Christopher Wilkins is a 65 y.o. male past medical history significant for A. fib, CHF with EF 30-35%, dyspnea, hypertension, prediabetes.  HPI Patient presents to emergency room today with chief complaint of shortness of breath and leg swelling x5 days.  He states he has been out of all of his medications x3 days including his Eliquis.  Patient states he feels like his shortness of breath has been progressively worsening.  He feels short of breath at rest and with ambulation.  He states walking from the parking lot to the ER he had to stop multiple times to catch his breath which is new for him.  He is also endorsing chest tightness.  He states that started last night and has been intermittent as well.  He states that chest tightness is mild in severity, rating it 4/10 in severity.  He has not noticed if chest pain was worse with exertion.  Patient later admits he fell in the bathroom while waiting in the lobby when he slipped on a paper towel on the floor.  He denies hitting his head, states he hit his right shoulder on the wall, no loss of consciousness and denies syncope.  He has aching pain with movement.    Past Medical History:  Diagnosis Date  . Atrial fibrillation ([place])   . CHF (congestive heart failure) ([place])   . Dyspnea   . Hypertension   . Insomnia   . Prediabetes     Patient Active Problem List   Diagnosis Date Noted  . Cardiogenic shock ([place])   . Acute exacerbation of CHF (congestive heart failure) ([place]) 08/13/2020  . A-fib ([place]) 08/03/2020  . CHF (congestive heart failure) ([place]) 08/03/2020  . Acute CHF ([place]) 07/02/2020  . Cardiomyopathy ([place]) 07/02/2020  . Anticoagulated 07/02/2020  . Essential hypertension 07/02/2020  . Atrial fibrillation with RVR ([place])  06/18/2020  . Angioedema 04/19/2019  . AKI (acute kidney injury) [facility])     Past Surgical History:  Procedure Laterality Date  . CARDIOVERSION N/A 06/20/2020   Procedure: CARDIOVERSION;  Surgeon: Jerline Pain, MD;  Location: [facility] ENDOSCOPY;  Service: Cardiovascular;  Laterality: N/A;  . CARDIOVERSION N/A 08/20/2020   Procedure: CARDIOVERSION;  Surgeon: [name], MD;  Location: [facility] ENDOSCOPY;  Service: Cardiovascular;  Laterality: N/A;  . CHOLECYSTECTOMY    . OTHER SURGICAL HISTORY     "Shot a couple of times"  . RIGHT/LEFT HEART CATH AND CORONARY ANGIOGRAPHY N/A 08/18/2020   Procedure: RIGHT/LEFT HEART CATH AND CORONARY ANGIOGRAPHY;  Surgeon: [name], MD;  Location: [place] CV LAB;  Service: Cardiovascular;  Laterality: N/A;  . TEE WITHOUT CARDIOVERSION N/A 06/20/2020   Procedure: TRANSESOPHAGEAL ECHOCARDIOGRAM (TEE);  Surgeon: Jerline Pain, MD;  Location: [facility] ENDOSCOPY;  Service: Cardiovascular;  Laterality: N/A;  . TEE WITHOUT CARDIOVERSION N/A 08/20/2020   Procedure: TRANSESOPHAGEAL ECHOCARDIOGRAM (TEE);  Surgeon: [name], MD;  Location: [facility] ENDOSCOPY;  Service: Cardiovascular;  Laterality: N/A;       Family History  Problem Relation Age of Onset  . Colon cancer Neg Hx   . Depression Neg Hx     Social History   Tobacco Use  . Smoking status: Current Some Day Smoker    Packs/day: 0.25  Types: Cigarettes    Last attempt to quit: 05/14/2020    Years since quitting: 0.4  . Smokeless tobacco: Never Used  . Tobacco comment: 1-2 cigarettes daily  Vaping Use  . Vaping Use: Never used  Substance Use Topics  . Alcohol use: Yes    Alcohol/week: 2.0 standard drinks    Types: 2 Cans of beer per week    Comment: occasionally; once every 2 weeks  . Drug use: No    Home Medications Prior to Admission medications   Medication Sig Start Date End Date Taking? Authorizing Provider  amiodarone (PACERONE) 200 MG tablet Take 1 tablet (200 mg total)  by mouth daily. 09/30/20   Lyda Jester M, PA-C  apixaban (ELIQUIS) 5 MG TABS tablet Take 1 tablet (5 mg total) by mouth 2 (two) times daily. 09/30/20   Lyda Jester M, PA-C  atorvastatin (LIPITOR) 40 MG tablet Take 1 tablet (40 mg total) by mouth daily. 10/29/20   [name], NP  Blood Pressure Monitor DEVI Please provide patient with insurance approved blood pressure monitor I10.0 10/29/20   [name], NP  dapagliflozin propanediol (FARXIGA) 10 MG TABS tablet Take 1 tablet (10 mg total) by mouth daily before breakfast. 09/30/20   Lyda Jester M, PA-C  digoxin (LANOXIN) 0.125 MG tablet Take 0.5 tablets (0.0625 mg total) by mouth daily. 09/30/20   Lyda Jester M, PA-C  furosemide (LASIX) 80 MG tablet Take 1 tablet (80 mg total) by mouth daily. 09/30/20   Lyda Jester M, PA-C  metoprolol succinate (TOPROL XL) 25 MG 24 hr tablet Take 1 tablet (25 mg total) by mouth daily. 10/21/20   Conrad Gonzales, NP  Misc. Devices (GNP DIGITAL WEIGHT SCALE) MISC 1 Device by Does not apply route daily. Please provide patient with insurance approved scale ICD110 I50.2 10/29/20   [name], NP  sacubitril-valsartan (ENTRESTO) 24-26 MG Take 1 tablet by mouth 2 (two) times daily. 09/30/20   Consuelo Pandy, PA-C  spironolactone (ALDACTONE) 25 MG tablet Take 1 tablet (25 mg total) by mouth daily. 09/30/20   Consuelo Pandy, PA-C    Allergies    Codeine, Lisinopril, and Shellfish allergy  Review of Systems   Review of Systems All other systems are reviewed and are negative for acute change except as noted in the HPI.  Physical Exam Updated Vital Signs BP 129/72 (BP Location: Right Arm)   Pulse 84   Temp 98.3 F (36.8 C) (Oral)   Resp 18   Ht 5\' 6"  (1.676 m)   Wt 100.7 kg   SpO2 100%   BMI 35.83 kg/m   Physical Exam Vitals and nursing note reviewed.  Constitutional:      General: He is not in acute distress.    Appearance: He is not ill-appearing.   HENT:     Head: Normocephalic and atraumatic.     Comments: No tenderness to palpation of skull. No deformities or crepitus noted. No open wounds, abrasions or lacerations.    Right Ear: Tympanic membrane and external ear normal.     Left Ear: Tympanic membrane and external ear normal.     Nose: Nose normal.     Mouth/Throat:     Mouth: Mucous membranes are moist.     Pharynx: Oropharynx is clear.  Eyes:     General: No scleral icterus.       Right eye: No discharge.        Left eye: No discharge.  Extraocular Movements: Extraocular movements intact.     Conjunctiva/sclera: Conjunctivae normal.     Pupils: Pupils are equal, round, and reactive to light.  Neck:     Vascular: No JVD.     Comments: Full ROM intact without spinous process TTP. No bony stepoffs or deformities, no paraspinous muscle TTP or muscle spasms. No rigidity or meningeal signs. No bruising, erythema, or swelling.  Cardiovascular:     Rate and Rhythm: Normal rate and regular rhythm.     Pulses: Normal pulses.          Radial pulses are 2+ on the right side and 2+ on the left side.     Heart sounds: Normal heart sounds.  Pulmonary:     Comments: Lungs clear to auscultation in all fields. Symmetric chest rise. No wheezing, rales, or rhonchi. Abdominal:     Comments: Abdomen is soft, non-distended, and non-tender in all quadrants. No rigidity, no guarding. No peritoneal signs.  Musculoskeletal:        General: Normal range of motion.     Cervical back: Normal range of motion.  Skin:    General: Skin is warm and dry.     Capillary Refill: Capillary refill takes less than 2 seconds.  Neurological:     Mental Status: He is oriented to person, place, and time.     GCS: GCS eye subscore is 4. GCS verbal subscore is 5. GCS motor subscore is 6.     Comments: Speech is clear and goal oriented, follows commands CN III-XII intact, no facial droop Normal strength in upper and lower extremities bilaterally including  dorsiflexion and plantar flexion, strong and equal grip strength Sensation normal to light and sharp touch Moves extremities without ataxia, coordination intact Normal finger to nose and rapid alternating movements    Psychiatric:        Behavior: Behavior normal.     ED Results / Procedures / Treatments   Labs (all labs ordered are listed, but only abnormal results are displayed) Labs Reviewed  BASIC METABOLIC PANEL - Abnormal; Notable for the following components:      Result Value   Creatinine, Ser 1.43 (*)    Calcium 8.5 (*)    GFR, Estimated 54 (*)    All other components within normal limits  CBC - Abnormal; Notable for the following components:   WBC 14.4 (*)    RDW 18.3 (*)    All other components within normal limits  BRAIN NATRIURETIC PEPTIDE  TROPONIN I (HIGH SENSITIVITY)  TROPONIN I (HIGH SENSITIVITY)    EKG EKG Interpretation  Date/Time:  Wednesday November 12 2020 09:28:59 EST Ventricular Rate:  82 PR Interval:  170 QRS Duration: 90 QT Interval:  378 QTC Calculation: 441 R Axis:   52 Text Interpretation: Normal sinus rhythm Nonspecific T wave abnormality Abnormal ECG no significant change since Jan 2022 Confirmed by Sherwood Gambler [phone]) on 11/12/2020 2:32:28 PM   Radiology DG Chest 2 View  Result Date: 11/12/2020 CLINICAL DATA:  Dyspnea EXAM: CHEST - 2 VIEW COMPARISON:  08/15/2020, 08/03/2020, 08/27/2019 FINDINGS: The lungs are symmetrically well expanded. Focal nodular opacity within left mid lung zone appears more prominent than on prior examination and is new from remote prior examination of 08/27/2019. An underlying focal pulmonary nodule is not excluded. No pneumothorax or pleural effusion. No confluent pulmonary infiltrate. Cardiac size within normal limits. Pulmonary vascularity is normal. Osseous structures are age-appropriate. IMPRESSION: Focal nodular opacity within the peripheral left mid lung zone, increasingly conspicuous  and new since remote  prior examination. Dedicated CT imaging is recommended for further evaluation. No radiographic evidence of acute cardiopulmonary disease. Electronically Signed   By: Fidela Salisbury MD   On: 11/12/2020 09:51   VAS [place] LOWER EXTREMITY VENOUS (DVT) (ONLY [facility] & [facility])  Result Date: 11/12/2020  Lower Venous DVT Study Indications: Edema.  Comparison Study: no prior Performing Technologist: Abram Sander RVS  Examination Guidelines: A complete evaluation includes B-mode imaging, spectral Doppler, color Doppler, and power Doppler as needed of all accessible portions of each vessel. Bilateral testing is considered an integral part of a complete examination. Limited examinations for reoccurring indications may be performed as noted. The reflux portion of the exam is performed with the patient in reverse Trendelenburg.  +-----+---------------+---------+-----------+----------+--------------+ RIGHTCompressibilityPhasicitySpontaneityPropertiesThrombus Aging +-----+---------------+---------+-----------+----------+--------------+ CFV  Full           Yes      Yes                                 +-----+---------------+---------+-----------+----------+--------------+   +---------+---------------+---------+-----------+----------+--------------+ LEFT     CompressibilityPhasicitySpontaneityPropertiesThrombus Aging +---------+---------------+---------+-----------+----------+--------------+ CFV      Full           Yes      Yes                                 +---------+---------------+---------+-----------+----------+--------------+ SFJ      Full                                                        +---------+---------------+---------+-----------+----------+--------------+ FV Prox  Full                                                        +---------+---------------+---------+-----------+----------+--------------+ FV Mid   Full                                                         +---------+---------------+---------+-----------+----------+--------------+ FV DistalFull                                                        +---------+---------------+---------+-----------+----------+--------------+ PFV      Full                                                        +---------+---------------+---------+-----------+----------+--------------+ POP      Full           Yes      Yes                                 +---------+---------------+---------+-----------+----------+--------------+  PTV      Full                                                        +---------+---------------+---------+-----------+----------+--------------+ PERO     Full                                                        +---------+---------------+---------+-----------+----------+--------------+     Summary: RIGHT: - No evidence of common femoral vein obstruction.  LEFT: - There is no evidence of deep vein thrombosis in the lower extremity.  - No cystic structure found in the popliteal fossa.  *See table(s) above for measurements and observations.    Preliminary     Procedures Procedures   Medications Ordered in ED Medications  amiodarone (PACERONE) tablet 200 mg (200 mg Oral Given 11/12/20 1527)  spironolactone (ALDACTONE) tablet 25 mg (has no administration in time range)  furosemide (LASIX) tablet 80 mg (80 mg Oral Given 11/12/20 1527)  digoxin (LANOXIN) tablet 0.125 mg (0.125 mg Oral Given 11/12/20 1527)  apixaban (ELIQUIS) tablet 5 mg (5 mg Oral Given 11/12/20 1527)    ED Course  I have reviewed the triage vital signs and the nursing notes.  Pertinent labs & imaging results that were available during my care of the patient were reviewed by me and considered in my medical decision making (see chart for details).    MDM Rules/Calculators/A&P                          History provided by patient with additional history obtained from chart review.    65 yo chronically  ill appearing male presenting with shortness of breath and chest pain setting of medication noncompliance.  On exam patient is not in acute distress and is nontoxic in appearance.  Vital signs are reassuring as he is afebrile, hemodynamically stable, no hypoxia.  Lungs are clear to auscultation in all fields and he has normal work of breathing.  He has tenderness to palpation of left calf without palpable cords.  DP pulse 2+ bilaterally.  Given his complaint of fall in the bathroom, neuro exam performed and was normal.  Work-up was initiated in triage.  EKG without ischemic changes.  CBC shows leukocytosis of 14.4, no anemia.  Patient denies any infectious symptoms and has not been on steroids recently.  BMP shows BUN/creatinine is close to baseline, no significant electrolyte derangement.  BNP within normal range at 69.  Chest x-ray shows focal nodular opacity within the peripheral left midlung zone which appears to be increasingly conspicuous and new since prior exam.  CT chest recommended for further evaluation. Ultrasound was negative for DVT.   Will add troponin to work-up, xray for right shoulder pain, and CT chest based on xray findings.  Low suspicion for PE. Dose given of home medications. The patient was discussed with and seen by ED supervising physician Dr. Regenia Skeeter who agrees with the treatment plan.  Patient care transferred to J. Geiple PA-C at the end of my shift pending troponin and imaging. Patient presentation, ED course, and plan of care discussed with review of all  pertinent labs and imaging. Please see his note for further details regarding further ED course and disposition.   Portions of this note were generated with Lobbyist. Dictation errors may occur despite best attempts at proofreading.   Final Clinical Impression(s) / ED Diagnoses Final diagnoses:  Atrial fibrillation with RVR ([place])  Anticoagulant long-term use  Dyslipidemia, goal LDL below 70    Rx /  DC Orders ED Discharge Orders    None       Lewanda Rife 11/12/20 1554    Sherwood Gambler, MD 11/13/20 [phone]

## 2020-11-12 NOTE — ED Triage Notes (Signed)
Patient reports that he has been out of his heart meds x 5 days. States that he now feels SOB and has swelling in extremities. Patient ambulatory and alert and oriented. Complains of pain to legs

## 2020-11-12 NOTE — Telephone Encounter (Signed)
Nurse case manager Mariann Laster came into the clinic to pick up patients medications. Per Mariann Laster, patient is in the ED due to not having his medication and he could not get his medication due to lack of transportation. Nurse case manager stated that she called up to the pharmacy and spoke with a male staff member and was not told that the pharmacy closed at 5:15. Mariann Laster requested to speak with Loma Sousa and DJ and said she already had both of their contact information. I told her we would document and let Loma Sousa and DJ know and that they would be in touch.

## 2020-11-13 ENCOUNTER — Telehealth: Payer: Self-pay

## 2020-11-13 MED FILL — DOXYCYCLINE HYCLATE 100 MG: 100 | 7 days supply | Qty: 14 | Fill #0

## 2020-11-13 NOTE — Telephone Encounter (Signed)
Message received from Laurena Slimmer, RN CM noting that patient was discharged from ED without his eliqiuis and digoxin. Informed her that that per [place], the patient picked up the meds today.   Also informed her that has the option of [place] home delivery or a local pharmacy that delivers.  [facility] will make home deliveries and accepts [facility].  [facility] pharmacy will deliver, it may take a few days. They do this via USPS/ UPS through [facility] outpatient pharmacy.  The patient needs to have a credit card on file with [facility] for co-pays.    Call placed to patient to inform him of above noted delivery options.  Message left with call back requested to this CM or [place], [place].

## 2020-12-05 ENCOUNTER — Encounter (HOSPITAL_COMMUNITY): Payer: Self-pay | Admitting: Internal Medicine

## 2020-12-05 ENCOUNTER — Other Ambulatory Visit: Payer: Self-pay

## 2020-12-05 ENCOUNTER — Ambulatory Visit (HOSPITAL_BASED_OUTPATIENT_CLINIC_OR_DEPARTMENT_OTHER)
Admission: RE | Admit: 2020-12-05 | Discharge: 2020-12-05 | Disposition: A | Discharge status: Home / Self Care | Payer: Medicare HMO | Source: Ambulatory Visit | Attending: Internal Medicine | Admitting: Internal Medicine

## 2020-12-05 ENCOUNTER — Ambulatory Visit (HOSPITAL_COMMUNITY)
Admission: RE | Admit: 2020-12-05 | Discharge: 2020-12-05 | Disposition: A | Discharge status: Home / Self Care | Payer: Medicare HMO | Source: Ambulatory Visit | Attending: Adult Health | Admitting: Adult Health

## 2020-12-05 VITALS — BP 160/90 | HR 69 | Wt 221.8 lb

## 2020-12-05 DIAGNOSIS — G4733 Obstructive sleep apnea (adult) (pediatric): Secondary | ICD-10-CM

## 2020-12-05 DIAGNOSIS — R0683 Snoring: Secondary | ICD-10-CM | POA: Diagnosis not present

## 2020-12-05 DIAGNOSIS — F17210 Nicotine dependence, cigarettes, uncomplicated: Secondary | ICD-10-CM | POA: Diagnosis not present

## 2020-12-05 DIAGNOSIS — I110 Hypertensive heart disease with heart failure: Secondary | ICD-10-CM | POA: Diagnosis not present

## 2020-12-05 DIAGNOSIS — Z7984 Long term (current) use of oral hypoglycemic drugs: Secondary | ICD-10-CM | POA: Insufficient documentation

## 2020-12-05 DIAGNOSIS — Z7901 Long term (current) use of anticoagulants: Secondary | ICD-10-CM | POA: Insufficient documentation

## 2020-12-05 DIAGNOSIS — Z79899 Other long term (current) drug therapy: Secondary | ICD-10-CM | POA: Insufficient documentation

## 2020-12-05 DIAGNOSIS — R400 Somnolence: Secondary | ICD-10-CM | POA: Insufficient documentation

## 2020-12-05 DIAGNOSIS — I4819 Other persistent atrial fibrillation: Secondary | ICD-10-CM | POA: Diagnosis not present

## 2020-12-05 DIAGNOSIS — I480 Paroxysmal atrial fibrillation: Secondary | ICD-10-CM | POA: Diagnosis not present

## 2020-12-05 DIAGNOSIS — I5022 Chronic systolic (congestive) heart failure: Secondary | ICD-10-CM

## 2020-12-05 DIAGNOSIS — E669 Obesity, unspecified: Secondary | ICD-10-CM | POA: Insufficient documentation

## 2020-12-05 DIAGNOSIS — I5032 Chronic diastolic (congestive) heart failure: Secondary | ICD-10-CM

## 2020-12-05 DIAGNOSIS — I5042 Chronic combined systolic (congestive) and diastolic (congestive) heart failure: Secondary | ICD-10-CM | POA: Insufficient documentation

## 2020-12-05 LAB — ECHOCARDIOGRAM COMPLETE
Area-P 1/2: 2.34 cm2
MV VTI: 2.03 cm2
S' Lateral: 4.2 cm

## 2020-12-05 MED ORDER — ENTRESTO 49-51 MG PO TABS: 1.0000 | ORAL_TABLET | Freq: Two times a day (BID) | ORAL | 6 refills | Status: DC

## 2020-12-05 NOTE — Patient Instructions (Signed)
STOP Digoxin  Increase Entresto to 49/51 mg (1 tablet) Twice daily   You have been referred to Dr. Gardiner Rhyme they will call you for a appointment  Your physician recommends that you schedule a follow-up appointment in: as needed  If you have any questions or concerns before your next appointment please send [place] a message through [facility] or call our office at [phone].    TO LEAVE A MESSAGE FOR THE NURSE SELECT OPTION 2, PLEASE LEAVE A MESSAGE INCLUDING: . YOUR NAME . DATE OF BIRTH . CALL BACK NUMBER . REASON FOR CALL**this is important as we prioritize the call backs  [place] AS LONG AS YOU CALL BEFORE 4:00 PM  At the [facility], you and your health needs are our priority. As part of our continuing mission to provide you with exceptional heart care, we have created designated Provider Care Teams. These Care Teams include your primary Cardiologist (physician) and Advanced Practice Providers (APPs- Physician Assistants and Nurse Practitioners) who all work together to provide you with the care you need, when you need it.   You may see any of the following providers on your designated Care Team at your next follow up: Marland Kitchen Dr Glori Bickers . Dr Loralie Champagne . Dr Vickki Muff . Darrick Grinder, NP . Lyda Jester, [place] . Audry Riles, PharmD   Please be sure to bring in all your medications bottles to every appointment.

## 2020-12-05 NOTE — Addendum Note (Signed)
Encounter addended by: Stanford Scotland, RN on: 12/05/2020 3:57 PM  Actions taken: Medication long-term status modified, Pharmacy for encounter modified, Order list changed, Diagnosis association updated, Clinical Note Signed

## 2020-12-05 NOTE — Progress Notes (Signed)
Echocardiogram 2D Echocardiogram has been performed.  Christopher Wilkins [name] 12/05/2020, 2:17 PM

## 2020-12-05 NOTE — Progress Notes (Signed)
Advanced Heart Failure Clinic Note   Referring Physician: PCP: [name], NP PCP-Cardiologist: Donato Heinz, MD  AHFC: Dr. Haroldine Laws   HPI: 65 yo male with obesity, HTN, tobacco use, systolic heart failure and atrial fibrillation.   Admitted 9/21 with acute onset CHF and afib. Echo showed LVEF of 35-40%. Underwent TEE/DCCV with restoration of sinus rhythm. Also w/ severe LAE at risk for early recurrence. CHADS2-VASc score of 2. Discharged onEliquis, Entresto, Toprol and lasix.   Readmitted 11/21 with recurrent HF and ERAF. EF further reduced down to 20% on repeat echo. Developed shock with AKI and lactic acidosis. Initial Co-ox 44%. CVP 14.  Started on milrinone. Diuresed w/ IV Lasix. Had [facility] showing normal cors and well compensated hemodynamics on milrinone. He was able to wean off milrinone w/ stable Co-ox and underwent repeat TEE/ DCCV back to NSR. Was discharged home on 08/21/2020.  D/c wt 198 lb.   Seen back for post hospital f/u 12/21 and was back in atrial fibrillation w/ resting HR in the upper 90s. Referred to AF Clinic   Seen Dr. Roderic Palau in [facility] in 12/21. Was back in NSR. Zio patch placed. No AF. Occasioanl brief atrial tach.   Today he returns for HF follow up. Says he is doing well. No CP or SOB. Says he gained some weight recently. 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 No edema, orthopnea or PND. Unable to get [place] sleep study to work.   Echo today 12/05/20  EF 55% RV normal Personally reviewed   ECHO 08/15/2020 EF 25%  Echo 06/2020 EF 30-35% RA severely dilated   Past Medical History:  Diagnosis Date  . Atrial fibrillation ([place])   . CHF (congestive heart failure) ([place])   . Dyspnea   . Hypertension   . Insomnia   . Prediabetes     Current Outpatient Medications  Medication Sig Dispense Refill  . amiodarone (PACERONE) 200 MG tablet Take 1 tablet (200 mg total) by mouth daily. 30 tablet 0  . apixaban (ELIQUIS) 5 MG TABS  tablet Take 1 tablet (5 mg total) by mouth 2 (two) times daily. 60 tablet 0  . atorvastatin (LIPITOR) 40 MG tablet Take 1 tablet (40 mg total) by mouth daily. 30 tablet 0  . Blood Pressure Monitor DEVI Please provide patient with insurance approved blood pressure monitor I10.0 1 each 0  . dapagliflozin propanediol (FARXIGA) 10 MG TABS tablet Take 1 tablet (10 mg total) by mouth daily before breakfast. 30 tablet 0  . digoxin (LANOXIN) 0.125 MG tablet Take 0.5 tablets (0.0625 mg total) by mouth daily. 15 tablet 0  . doxycycline (VIBRAMYCIN) 100 MG capsule Take 1 capsule (100 mg total) by mouth 2 (two) times daily. 14 capsule 0  . furosemide (LASIX) 80 MG tablet Take 1 tablet (80 mg total) by mouth daily. 30 tablet 0  . metoprolol succinate (TOPROL XL) 25 MG 24 hr tablet Take 1 tablet (25 mg total) by mouth daily. 30 tablet 0  . Misc. Devices (GNP DIGITAL WEIGHT SCALE) MISC 1 Device by Does not apply route daily. Please provide patient with insurance approved scale ICD110 I50.2 1 each 0  . sacubitril-valsartan (ENTRESTO) 24-26 MG Take 1 tablet by mouth 2 (two) times daily. 60 tablet 0  . spironolactone (ALDACTONE) 25 MG tablet Take 1 tablet (25 mg total) by mouth daily. 30 tablet 0   No current facility-administered medications for this encounter.    Allergies  Allergen  Reactions  . Codeine Nausea And Vomiting  . Lisinopril Other (See Comments)    "Didn't do well in my body"  . Shellfish Allergy Other (See Comments)    Can tolerate shrimp in 2021 (??)      Social History   Socioeconomic History  . Marital status: Single    Spouse name: Not on file  . Number of children: Not on file  . Years of education: Not on file  . Highest education level: Not on file  Occupational History  . Not on file  Tobacco Use  . Smoking status: Current Some Day Smoker    Packs/day: 0.25    Types: Cigarettes    Last attempt to quit: 05/14/2020    Years since quitting: 0.5  . Smokeless tobacco: Never  Used  . Tobacco comment: 1-2 cigarettes daily  Vaping Use  . Vaping Use: Never used  Substance and Sexual Activity  . Alcohol use: Yes    Alcohol/week: 2.0 standard drinks    Types: 2 Cans of beer per week    Comment: occasionally; once every 2 weeks  . Drug use: No  . Sexual activity: Not on file  Other Topics Concern  . Not on file  Social History Narrative  . Not on file   Social Determinants of Health   Financial Resource Strain: Not on file  Food Insecurity: Not on file  Transportation Needs: Not on file  Physical Activity: Not on file  Stress: Not on file  Social Connections: Not on file  Intimate Partner Violence: Not on file      Family History  Problem Relation Age of Onset  . Colon cancer Neg Hx   . Depression Neg Hx     Vitals:   12/05/20 1512  BP: (!) 160/90  Pulse: 69  SpO2: 97%  Weight: 100.6 kg (221 lb 12.8 oz)   Wt Readings from Last 3 Encounters:  12/05/20 100.6 kg (221 lb 12.8 oz)  11/12/20 100.7 kg (222 lb 0.1 oz)  10/21/20 96.9 kg (213 lb 9.6 oz)     PHYSICAL EXAM: General:  Well appearing. No resp difficulty HEENT: normal Neck: supple. no JVD. Carotids 2+ bilat; no bruits. No lymphadenopathy or thryomegaly appreciated. Cor: PMI nondisplaced. Regular rate & rhythm. No rubs, gallops or murmurs. Lungs: clear Abdomen:obese soft, nontender, nondistended. No hepatosplenomegaly. No bruits or masses. Good bowel sounds. Extremities: no cyanosis, clubbing, rash, edema Neuro: alert & orientedx3, cranial nerves grossly intact. moves all 4 extremities w/o difficulty. Affect pleasant    ASSESSMENT & PLAN:  1. Chronic Systolic HF  - Recent admission 73/71 for a/c systolic heart failure>>Cardiogenic shock requiring milrinone, in setting of Afib w/ RVR.  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  - Echo today 12/05/20  EF 55% RV normal Personally reviewed - NYHA II.  Volume status stable despite weight gain.  - Continue lasix 80 mg po daily.  - Continue spiro 25 mg daily.  - BP high. Increase Entresto 49/51 BID - Continue Farxiga 10 mg daily   - Continue Toprol XL 25 mg daily.   - Stop digoxin - Recent labs ok  2. Persistent Atrial Fibrillation  - s/p DC-CV 9/21. Had ERAF>>underwent repeat DCCV 11/21 back to NSR - Has been seen in AF Clinic -> remains in NSr - Continue amiodarone 200 mg once a day  - Zio 12/21 with no  AF - Increase Toprol XL 25 mg daily.  - Continue eliquis 5 mg bid. Denies abnormal bleeding.  - suspect underlying OSA (daytime somnolence and snorning). Set up home sleep study.    3. HTN - Elevated. Increase Entresto   4. Probable OSA - Will work on getting PSG  EF has now normalized with control of AF. Can graduate HF Clinic and f/u with Dr. Gardiner Rhyme.  Glori Bickers, MD 12/05/20

## 2020-12-09 ENCOUNTER — Telehealth (HOSPITAL_COMMUNITY): Payer: Self-pay | Admitting: Surgery

## 2020-12-09 NOTE — Telephone Encounter (Signed)
I called Christopher Wilkins in reference to completing another home sleep study as the first one he had problems with.  He will come pick up another device tomorrow after another appt in a different clinic.  I will leave device at front desk for his retrieval.

## 2020-12-10 ENCOUNTER — Ambulatory Visit: Payer: Medicare HMO | Admitting: Nurse Practitioner

## 2020-12-13 ENCOUNTER — Other Ambulatory Visit: Payer: Self-pay

## 2020-12-13 ENCOUNTER — Emergency Department (HOSPITAL_BASED_OUTPATIENT_CLINIC_OR_DEPARTMENT_OTHER): Payer: Medicare Other

## 2020-12-13 ENCOUNTER — Emergency Department (HOSPITAL_COMMUNITY): Payer: Medicare Other

## 2020-12-13 ENCOUNTER — Encounter (HOSPITAL_COMMUNITY): Payer: Self-pay

## 2020-12-13 ENCOUNTER — Emergency Department (HOSPITAL_COMMUNITY)
Admission: EM | Admit: 2020-12-13 | Discharge: 2020-12-13 | Disposition: A | Discharge status: Home / Self Care | Payer: Medicare Other | Attending: Emergency Medicine | Admitting: Emergency Medicine

## 2020-12-13 DIAGNOSIS — M79672 Pain in left foot: Secondary | ICD-10-CM

## 2020-12-13 DIAGNOSIS — M7989 Other specified soft tissue disorders: Secondary | ICD-10-CM

## 2020-12-13 DIAGNOSIS — Z79899 Other long term (current) drug therapy: Secondary | ICD-10-CM | POA: Diagnosis not present

## 2020-12-13 DIAGNOSIS — Z760 Encounter for issue of repeat prescription: Secondary | ICD-10-CM | POA: Diagnosis not present

## 2020-12-13 DIAGNOSIS — L03116 Cellulitis of left lower limb: Secondary | ICD-10-CM | POA: Diagnosis not present

## 2020-12-13 DIAGNOSIS — Z7901 Long term (current) use of anticoagulants: Secondary | ICD-10-CM | POA: Diagnosis not present

## 2020-12-13 DIAGNOSIS — I110 Hypertensive heart disease with heart failure: Secondary | ICD-10-CM | POA: Insufficient documentation

## 2020-12-13 DIAGNOSIS — F17210 Nicotine dependence, cigarettes, uncomplicated: Secondary | ICD-10-CM | POA: Diagnosis not present

## 2020-12-13 DIAGNOSIS — I509 Heart failure, unspecified: Secondary | ICD-10-CM | POA: Diagnosis not present

## 2020-12-13 LAB — CBC
HCT: 47.8 % (ref 39.0–52.0)
Hemoglobin: 15.2 g/dL (ref 13.0–17.0)
MCH: 26.9 pg (ref 26.0–34.0)
MCHC: 31.8 g/dL (ref 30.0–36.0)
MCV: 84.6 fL (ref 80.0–100.0)
Platelets: 209 10*3/uL (ref 150–400)
RBC: 5.65 MIL/uL (ref 4.22–5.81)
RDW: 17.4 % — ABNORMAL HIGH (ref 11.5–15.5)
WBC: 7.4 10*3/uL (ref 4.0–10.5)
nRBC: 0 % (ref 0.0–0.2)

## 2020-12-13 LAB — TROPONIN I (HIGH SENSITIVITY)
Troponin I (High Sensitivity): 16 ng/L (ref <18)
Troponin I (High Sensitivity): 13 ng/L (ref <18)

## 2020-12-13 LAB — BASIC METABOLIC PANEL
Anion gap: 12 (ref 5–15)
BUN: 23 mg/dL (ref 8–23)
CO2: 21 mmol/L — ABNORMAL LOW (ref 22–32)
Calcium: 8.9 mg/dL (ref 8.9–10.3)
Chloride: 104 mmol/L (ref 98–111)
Creatinine, Ser: 1.17 mg/dL (ref 0.61–1.24)
GFR, Estimated: >60 mL/min (ref >60)
Glucose, Bld: 111 mg/dL — ABNORMAL HIGH (ref 70–99)
Potassium: 4.2 mmol/L (ref 3.5–5.1)
Sodium: 137 mmol/L (ref 135–145)

## 2020-12-13 LAB — BRAIN NATRIURETIC PEPTIDE: B Natriuretic Peptide: 39.6 pg/mL (ref 0.0–100.0)

## 2020-12-13 MED ORDER — ENTRESTO 49-51 MG PO TABS: 1.0000 | ORAL_TABLET | Freq: Two times a day (BID) | ORAL | 0 refills | Status: DC

## 2020-12-13 MED ORDER — ENTRESTO 49-51 MG PO TABS: 1.0000 | ORAL_TABLET | Freq: Two times a day (BID) | ORAL | 0 refills | Status: AC

## 2020-12-13 MED ORDER — SPIRONOLACTONE 25 MG PO TABS
25.0000 mg | ORAL_TABLET | Freq: Every day | ORAL | 0 refills | Status: DC
Start: 2020-12-13 — End: 2020-12-13

## 2020-12-13 MED ORDER — FENTANYL CITRATE (PF) 100 MCG/2ML IJ SOLN
25.0000 ug | Freq: Once | INTRAMUSCULAR | Status: AC
  Administered 2020-12-13: 25 ug via INTRAVENOUS
  Filled 2020-12-13: qty 2

## 2020-12-13 MED ORDER — CEPHALEXIN 500 MG PO CAPS: 500.0000 mg | ORAL_CAPSULE | Freq: Two times a day (BID) | ORAL | 0 refills | Status: AC

## 2020-12-13 MED ORDER — HYDROCODONE-ACETAMINOPHEN 5-325 MG PO TABS
1.0000 | ORAL_TABLET | Freq: Once | ORAL | Status: AC
  Administered 2020-12-13: 1 via ORAL
  Filled 2020-12-13: qty 1

## 2020-12-13 MED ORDER — CEPHALEXIN 500 MG PO CAPS: 500.0000 mg | ORAL_CAPSULE | Freq: Two times a day (BID) | ORAL | 0 refills | Status: DC

## 2020-12-13 MED ORDER — SPIRONOLACTONE 25 MG PO TABS: 25.0000 mg | ORAL_TABLET | Freq: Every day | ORAL | 0 refills | Status: DC

## 2020-12-13 NOTE — ED Notes (Signed)
Pt ambulated around the room but c/o 8/10 , Pt sp02 dropped from 97 to 92% with ambulation. Pt c/o sob.

## 2020-12-13 NOTE — Discharge Instructions (Addendum)
Take your antibiotics as prescribed. I have refilled your medications that you ran out of. You will need to follow-up with your primary care provider about refills of the rest of your medications if you run out. Return to the ER if you start to experience worsening pain, chest pain, shortness of breath, fever or increased pain or swelling beyond 2 days after starting the antibiotics

## 2020-12-13 NOTE — ED Provider Notes (Signed)
[facility] EMERGENCY DEPARTMENT Provider Note   CSN: [id number] Arrival date & time: 12/13/20  8144     History Chief Complaint  Patient presents with  . Leg Swelling    Christopher Wilkins is a 65 y.o. male with a past medical history of A. fib on Eliquis, CHF with an EF of 50 to 55%, hypertension presenting to the ED with a chief complaint of left leg swelling.  Began having left leg pain and swelling since last night.  States that he stopped taking his medications about 3 days ago because he ran out. He then told me he found one day worth of pills this morning so he took all of his morning medications. He cannot recall any incident that may have triggered his left foot and leg pain or swelling.  He denies any injuries or falls.  Reports some shortness of breath when he was ambulating to the ER this morning.  Denies any chest pain, abdominal pain, vomiting, diarrhea, fever.  HPI     Past Medical History:  Diagnosis Date  . Atrial fibrillation ([place])   . CHF (congestive heart failure) ([place])   . Dyspnea   . Hypertension   . Insomnia   . Prediabetes     Patient Active Problem List   Diagnosis Date Noted  . Cardiogenic shock ([place])   . Acute exacerbation of CHF (congestive heart failure) ([place]) 08/13/2020  . A-fib ([place]) 08/03/2020  . CHF (congestive heart failure) ([place]) 08/03/2020  . Acute CHF ([place]) 07/02/2020  . Cardiomyopathy ([place]) 07/02/2020  . Anticoagulated 07/02/2020  . Essential hypertension 07/02/2020  . Atrial fibrillation with RVR ([place]) 06/18/2020  . Angioedema 04/19/2019  . AKI (acute kidney injury) [facility])     Past Surgical History:  Procedure Laterality Date  . CARDIOVERSION N/A 06/20/2020   Procedure: CARDIOVERSION;  Surgeon: Jerline Pain, MD;  Location: [facility] ENDOSCOPY;  Service: Cardiovascular;  Laterality: N/A;  . CARDIOVERSION N/A 08/20/2020   Procedure: CARDIOVERSION;  Surgeon: [name], MD;  Location: [facility] ENDOSCOPY;  Service:  Cardiovascular;  Laterality: N/A;  . CHOLECYSTECTOMY    . OTHER SURGICAL HISTORY     "Shot a couple of times"  . RIGHT/LEFT HEART CATH AND CORONARY ANGIOGRAPHY N/A 08/18/2020   Procedure: RIGHT/LEFT HEART CATH AND CORONARY ANGIOGRAPHY;  Surgeon: [name], MD;  Location: [facility] CV LAB;  Service: Cardiovascular;  Laterality: N/A;  . TEE WITHOUT CARDIOVERSION N/A 06/20/2020   Procedure: TRANSESOPHAGEAL ECHOCARDIOGRAM (TEE);  Surgeon: Jerline Pain, MD;  Location: [facility] ENDOSCOPY;  Service: Cardiovascular;  Laterality: N/A;  . TEE WITHOUT CARDIOVERSION N/A 08/20/2020   Procedure: TRANSESOPHAGEAL ECHOCARDIOGRAM (TEE);  Surgeon: [name], MD;  Location: [facility] ENDOSCOPY;  Service: Cardiovascular;  Laterality: N/A;       Family History  Problem Relation Age of Onset  . Colon cancer Neg Hx   . Depression Neg Hx     Social History   Tobacco Use  . Smoking status: Current Some Day Smoker    Packs/day: 0.25    Types: Cigarettes    Last attempt to quit: 05/14/2020    Years since quitting: 0.5  . Smokeless tobacco: Never Used  . Tobacco comment: 1-2 cigarettes daily  Vaping Use  . Vaping Use: Never used  Substance Use Topics  . Alcohol use: Yes    Alcohol/week: 2.0 standard drinks    Types: 2 Cans of beer per week    Comment: occasionally; once every 2 weeks  .  Drug use: No    Home Medications Prior to Admission medications   Medication Sig Start Date End Date Taking? Authorizing Provider  cephALEXin (KEFLEX) 500 MG capsule Take 1 capsule (500 mg total) by mouth 2 (two) times daily for 7 days. 12/13/20 12/20/20 Yes Cheney Ewart, PA-C  amiodarone (PACERONE) 200 MG tablet Take 1 tablet (200 mg total) by mouth daily. 11/12/20   Carlisle Cater, PA-C  apixaban (ELIQUIS) 5 MG TABS tablet Take 1 tablet (5 mg total) by mouth 2 (two) times daily. 11/12/20   Carlisle Cater, PA-C  atorvastatin (LIPITOR) 40 MG tablet Take 1 tablet (40 mg total) by mouth daily. 11/12/20   Carlisle Cater,  PA-C  Blood Pressure Monitor DEVI Please provide patient with insurance approved blood pressure monitor I10.0 10/29/20   [name], NP  dapagliflozin propanediol (FARXIGA) 10 MG TABS tablet Take 1 tablet (10 mg total) by mouth daily before breakfast. 11/12/20   Carlisle Cater, PA-C  furosemide (LASIX) 80 MG tablet Take 1 tablet (80 mg total) by mouth daily. 11/12/20   Carlisle Cater, PA-C  metoprolol succinate (TOPROL XL) 25 MG 24 hr tablet Take 1 tablet (25 mg total) by mouth daily. 11/12/20   Carlisle Cater, PA-C  Misc. Devices (GNP DIGITAL WEIGHT SCALE) MISC 1 Device by Does not apply route daily. Please provide patient with insurance approved scale ICD110 I50.2 10/29/20   [name], NP  sacubitril-valsartan (ENTRESTO) 49-51 MG Take 1 tablet by mouth 2 (two) times daily for 15 days. 12/13/20 12/28/20  Delia Heady, PA-C  spironolactone (ALDACTONE) 25 MG tablet Take 1 tablet (25 mg total) by mouth daily for 15 days. 12/13/20 12/28/20  Delia Heady, PA-C    Allergies    Codeine, Lisinopril, and Shellfish allergy  Review of Systems   Review of Systems  Constitutional: Negative for appetite change, chills and fever.  HENT: Negative for ear pain, rhinorrhea, sneezing and sore throat.   Eyes: Negative for photophobia and visual disturbance.  Respiratory: Negative for cough, chest tightness, shortness of breath and wheezing.   Cardiovascular: Positive for leg swelling. Negative for chest pain and palpitations.  Gastrointestinal: Negative for abdominal pain, blood in stool, constipation, diarrhea, nausea and vomiting.  Genitourinary: Negative for dysuria, hematuria and urgency.  Musculoskeletal: Negative for myalgias.  Skin: Negative for rash.  Neurological: Negative for dizziness, weakness and light-headedness.    Physical Exam Updated Vital Signs BP (!) 153/93   Pulse 76   Temp 98 F (36.7 C) (Oral)   Resp 17   Ht 5\' 6"  (1.676 m)   Wt 103 kg   SpO2 99%   BMI 36.64 kg/m   Physical  Exam Vitals and nursing note reviewed.  Constitutional:      General: He is not in acute distress.    Appearance: He is well-developed and well-nourished.  HENT:     Head: Normocephalic and atraumatic.     Nose: Nose normal.  Eyes:     General: No scleral icterus.       Right eye: No discharge.        Left eye: No discharge.     Extraocular Movements: EOM normal.     Conjunctiva/sclera: Conjunctivae normal.  Cardiovascular:     Rate and Rhythm: Normal rate and regular rhythm.     Pulses: Intact distal pulses.     Heart sounds: Normal heart sounds. No murmur heard. No friction rub. No gallop.   Pulmonary:     Effort: Pulmonary effort is normal.  No respiratory distress.     Breath sounds: Normal breath sounds.  Abdominal:     General: Bowel sounds are normal. There is no distension.     Palpations: Abdomen is soft.     Tenderness: There is no abdominal tenderness. There is no guarding.  Musculoskeletal:        General: Normal range of motion.     Cervical back: Normal range of motion and neck supple.     Right lower leg: Edema present.     Left lower leg: Edema (diffuse TTP of L foot) present.     Comments: 2+ DP pulses noted bilaterally. No significant erythema or warmth of feet noted. Normal sensation to light touch of BLE.  Skin:    General: Skin is warm and dry.     Findings: No rash.  Neurological:     Mental Status: He is alert.     Motor: No abnormal muscle tone.     Coordination: Coordination normal.  Psychiatric:        Mood and Affect: Mood and affect normal.     ED Results / Procedures / Treatments   Labs (all labs ordered are listed, but only abnormal results are displayed) Labs Reviewed  BASIC METABOLIC PANEL - Abnormal; Notable for the following components:      Result Value   CO2 21 (*)    Glucose, Bld 111 (*)    All other components within normal limits  CBC - Abnormal; Notable for the following components:   RDW 17.4 (*)    All other components  within normal limits  BRAIN NATRIURETIC PEPTIDE  TROPONIN I (HIGH SENSITIVITY)  TROPONIN I (HIGH SENSITIVITY)    EKG EKG Interpretation  Date/Time:  Saturday December 13 2020 07:16:06 EST Ventricular Rate:  78 PR Interval:  190 QRS Duration: 92 QT Interval:  406 QTC Calculation: 462 R Axis:   25 Text Interpretation: Normal sinus rhythm Nonspecific T wave abnormality Prolonged QT Abnormal ECG When comapred to prior, similar apperance. No STEMI Confirmed by Antony Blackbird [phone]) on 12/13/2020 9:16:10 AM   Radiology DG Chest 2 View  Result Date: 12/13/2020 CLINICAL DATA:  Shortness of breath. EXAM: CHEST - 2 VIEW COMPARISON:  November 12, 2020 FINDINGS: Nipple shadows project over the bases, not seen on November 12, 2020. The opacity seen on the previous study is no longer seen in the left mid lung. No new focal infiltrates. The cardiomediastinal silhouette is stable. No pneumothorax. No other acute abnormalities. IMPRESSION: 1. No acute abnormalities are identified. Electronically Signed   By: Dorise Bullion III M.D   On: 12/13/2020 08:10   DG Foot Complete Left  Result Date: 12/13/2020 CLINICAL DATA:  Acute LEFT foot pain and swelling for 1 day. No known injury. Initial encounter. EXAM: LEFT FOOT - COMPLETE 3+ VIEW COMPARISON:  None. FINDINGS: There is no evidence of fracture or dislocation. There is no evidence of arthropathy or other focal bone abnormality. No radiopaque foreign body is noted. Soft tissue swelling is noted. IMPRESSION: Soft tissue swelling without acute bony abnormality. Electronically Signed   By: Margarette Canada M.D.   On: 12/13/2020 08:51   VAS [place] LOWER EXTREMITY VENOUS (DVT) (ONLY [facility] & [facility] 7a-7p)  Result Date: 12/13/2020  Lower Venous DVT Study Indications: Left foot pain and swelling.  Comparison Study: Prior negative study done 11/12/20 Performing Technologist: Sharion Dove RVS  Examination Guidelines: A complete evaluation includes B-mode imaging, spectral Doppler, color  Doppler, and power Doppler as needed of  all accessible portions of each vessel. Bilateral testing is considered an integral part of a complete examination. Limited examinations for reoccurring indications may be performed as noted. The reflux portion of the exam is performed with the patient in reverse Trendelenburg.  +-----+---------------+---------+-----------+----------+--------------+ RIGHTCompressibilityPhasicitySpontaneityPropertiesThrombus Aging +-----+---------------+---------+-----------+----------+--------------+ CFV  Full           Yes      Yes                                 +-----+---------------+---------+-----------+----------+--------------+   +---------+---------------+---------+-----------+----------+--------------+ LEFT     CompressibilityPhasicitySpontaneityPropertiesThrombus Aging +---------+---------------+---------+-----------+----------+--------------+ CFV      Full           Yes      Yes                                 +---------+---------------+---------+-----------+----------+--------------+ SFJ      Full                                                        +---------+---------------+---------+-----------+----------+--------------+ FV Prox  Full                                                        +---------+---------------+---------+-----------+----------+--------------+ FV Mid   Full                                                        +---------+---------------+---------+-----------+----------+--------------+ FV DistalFull                                                        +---------+---------------+---------+-----------+----------+--------------+ PFV      Full                                                        +---------+---------------+---------+-----------+----------+--------------+ POP      Full           Yes      Yes                                  +---------+---------------+---------+-----------+----------+--------------+ PTV      Full                                                        +---------+---------------+---------+-----------+----------+--------------+ PERO     Full                                                        +---------+---------------+---------+-----------+----------+--------------+  Summary: RIGHT: - No evidence of common femoral vein obstruction.  LEFT: - Findings appear essentially unchanged compared to previous examination done 11/12/20. - There is no evidence of deep vein thrombosis in the lower extremity.  - No cystic structure found in the popliteal fossa.  *See table(s) above for measurements and observations.    Preliminary     Procedures Procedures   Medications Ordered in ED Medications  HYDROcodone-acetaminophen (NORCO/VICODIN) 5-325 MG per tablet 1 tablet (has no administration in time range)  fentaNYL (SUBLIMAZE) injection 25 mcg (25 mcg Intravenous Given 12/13/20 0933)    ED Course  I have reviewed the triage vital signs and the nursing notes.  Pertinent labs & imaging results that were available during my care of the patient were reviewed by me and considered in my medical decision making (see chart for details).  Clinical Course as of 12/13/20 1506  Sat Dec 13, 2020  0813 Negative for DVT in left lower extremity. [HK]  [id number] Troponin I (High Sensitivity): 16 [HK]  1312 Patient reports continued pain with ambulation despite fentanyl.  Will give additional dose of pain medication.  Advised him that we will treat him for cellulitis. [HK]  1313 Troponin I (High Sensitivity): 13 [HK]  1457 B Natriuretic Peptide: 39.6 [HK]    Clinical Course User Index [HK] Delia Heady, PA-C   MDM Rules/Calculators/A&P                          65 year old male with past medical history of A. fib on Eliquis, CHF with an EF of 50 to 55%, hypertension presenting to the ED with a chief complaint of  left leg swelling.  Left leg pain and swelling since last night.  He says that he stopped taking his medications about 3 days ago because he ran out.  Then told me he found 1 day worth of pills this morning so he took all of his morning medications.  Reports pain worse with ambulation and denies any injury or trauma that may have triggered this.  Reports some shortness of breath when ambulating to the ER today.  On exam there is diffuse tenderness of the left foot with some edema noted.  Equal and intact distal pulses noted bilaterally.  Normal sensation to light touch.  No tenderness of the left ankle.  He speaking complete sentences without difficulty.  No tachypnea or tachycardia noted.  EKG here shows normal sinus rhythm, no changes from prior tracings, no STEMI.  Chest x-ray is unremarkable.  DVT study is negative of the left lower extremity.  X-ray of the foot shows soft tissue swelling without any acute abnormalities.  Initial troponin is 16, delta check noted at 13.  BMP, CBC unremarkable.  BNP is normal.  Concern for cellulitis based on his physical exam findings and negative work-up here.  Patient ambulated here without hypoxia.  He does report pain on the extremity despite giving fentanyl.  I doubt this is gout as majority of pain is located throughout the foot and there is soft tissue swelling seen on x-ray.  Will start him on antibiotics and follow-up with PCP for improvement.  We will also refill his spironolactone and Entresto.  He has his medications at the bedside and I checked the remainder of the bottles, the only two that are empty are the Entresto and spironolactone; and informed him that he will need to follow-up with his PCP and cardiologist regarding further refills. Patient is agreeable  to the plan. Return precautions given   Patient is hemodynamically stable, in NAD, and able to ambulate in the ED. Evaluation does not show pathology that would require ongoing emergent intervention or  inpatient treatment. I explained the diagnosis to the patient. Pain has been managed and has no complaints prior to discharge. Patient is comfortable with above plan and is stable for discharge at this time. All questions were answered prior to disposition. Strict return precautions for returning to the ED were discussed. Encouraged follow up with PCP.   An After Visit Summary was printed and given to the patient.   Portions of this note were generated with [employer]. Dictation errors may occur despite best attempts at proofreading.  Final Clinical Impression(s) / ED Diagnoses Final diagnoses:  Cellulitis of left lower extremity  Medication refill    Rx / DC Orders ED Discharge Orders         Ordered    spironolactone (ALDACTONE) 25 MG tablet  Daily        12/13/20 1418    sacubitril-valsartan (ENTRESTO) 49-51 MG  2 times daily        12/13/20 1418    cephALEXin (KEFLEX) 500 MG capsule  2 times daily        12/13/20 1505           Delia Heady, PA-C 12/13/20 1506    Tegeler, Gwenyth Allegra, MD 12/15/20 1623

## 2020-12-13 NOTE — Progress Notes (Signed)
VASCULAR LAB    Left lower extremity venous duplex has been performed.  See CV proc for preliminary results.   Messaged results to Delia Heady, PA-C via secure chat.  Othella Slappey, RVT 12/13/2020, 8:37 AM

## 2020-12-13 NOTE — ED Triage Notes (Addendum)
Pt reports that his left lower extremity/foot is swollen. Tender to palpation. Strong pulse present and cap refill is < 3 sec. States that he has been out of all of his medications for 3 days. Swelling started last night. Reports SOB. Denies chest pain.

## 2020-12-13 NOTE — ED Notes (Signed)
Patient transported to X-ray 

## 2020-12-14 NOTE — Progress Notes (Deleted)
Cardiology Office Note:    Date:  12/14/2020   ID:  Christopher Wilkins, DOB 08-26-1956, MRN [id number]  PCP:  [name], NP  Cardiologist:  Donato Heinz, MD  Electrophysiologist:  None   Referring MD: [name], MD   No chief complaint on file. ***  History of Present Illness:    Christopher Wilkins is a 65 y.o. male with a hx of chronic combined systolic congestive heart failure, atrial fibrillation, tobacco use, hypertension who presents for an initial visit.  He previously followed with Dr. Haroldine Laws in the advanced heart failure clinic.  He was admitted in September 2021 with acute combined heart failure and atrial fibrillation.  Echocardiogram showed LVEF 35 to 40%.  He underwent TEE/DCCV with restoration of sinus rhythm.  He was discharged on Eliquis, Entresto, metoprolol, and Lasix.  He was readmitted in November 2021 with acute heart failure and atrial fibrillation.  Echocardiogram showed EF was down to 20%.  He was found to be in cardiogenic shock with AKI, lactic acidosis, coox 44%.  He was started on milrinone and diuresed with IV Lasix.  RHC/LHC showed normal coronary arteries.  He was able to be weaned off milrinone eventually underwent successful TEE/DCCV with restoration of sinus rhythm.  Hospital follow-up in December he was found to be back in atrial fibrillation.  Was referred to A. fib clinic and noted to be back in sinus rhythm.  Zio patch was placed, which showed no atrial fibrillation.  Echocardiogram on 12/05/2020 showed EF 50 to 55%, normal RV function, likely mild AAS.  Past Medical History:  Diagnosis Date  . Atrial fibrillation ([place])   . CHF (congestive heart failure) ([place])   . Dyspnea   . Hypertension   . Insomnia   . Prediabetes     Past Surgical History:  Procedure Laterality Date  . CARDIOVERSION N/A 06/20/2020   Procedure: CARDIOVERSION;  Surgeon: Jerline Pain, MD;  Location: [facility] ENDOSCOPY;  Service: Cardiovascular;  Laterality: N/A;  .  CARDIOVERSION N/A 08/20/2020   Procedure: CARDIOVERSION;  Surgeon: [name], MD;  Location: [facility] ENDOSCOPY;  Service: Cardiovascular;  Laterality: N/A;  . CHOLECYSTECTOMY    . OTHER SURGICAL HISTORY     "Shot a couple of times"  . RIGHT/LEFT HEART CATH AND CORONARY ANGIOGRAPHY N/A 08/18/2020   Procedure: RIGHT/LEFT HEART CATH AND CORONARY ANGIOGRAPHY;  Surgeon: [name], MD;  Location: [place] CV LAB;  Service: Cardiovascular;  Laterality: N/A;  . TEE WITHOUT CARDIOVERSION N/A 06/20/2020   Procedure: TRANSESOPHAGEAL ECHOCARDIOGRAM (TEE);  Surgeon: Jerline Pain, MD;  Location: [facility] ENDOSCOPY;  Service: Cardiovascular;  Laterality: N/A;  . TEE WITHOUT CARDIOVERSION N/A 08/20/2020   Procedure: TRANSESOPHAGEAL ECHOCARDIOGRAM (TEE);  Surgeon: [name], MD;  Location: [facility] ENDOSCOPY;  Service: Cardiovascular;  Laterality: N/A;    Current Medications: No outpatient medications have been marked as taking for the 12/17/20 encounter (Appointment) with Donato Heinz, MD.     Allergies:   Codeine, Lisinopril, and Shellfish allergy   Social History   Socioeconomic History  . Marital status: Single    Spouse name: Not on file  . Number of children: Not on file  . Years of education: Not on file  . Highest education level: Not on file  Occupational History  . Not on file  Tobacco Use  . Smoking status: Current Some Day Smoker    Packs/day: 0.25    Types: Cigarettes    Last attempt to quit: 05/14/2020  Years since quitting: 0.5  . Smokeless tobacco: Never Used  . Tobacco comment: 1-2 cigarettes daily  Vaping Use  . Vaping Use: Never used  Substance and Sexual Activity  . Alcohol use: Yes    Alcohol/week: 2.0 standard drinks    Types: 2 Cans of beer per week    Comment: occasionally; once every 2 weeks  . Drug use: No  . Sexual activity: Not on file  Other Topics Concern  . Not on file  Social History Narrative  . Not on file   Social  Determinants of Health   Financial Resource Strain: Not on file  Food Insecurity: Not on file  Transportation Needs: Not on file  Physical Activity: Not on file  Stress: Not on file  Social Connections: Not on file     Family History: The patient's ***family history is negative for Colon cancer and Depression.  ROS:   Please see the history of present illness.    *** All other systems reviewed and are negative.  EKGs/Labs/Other Studies Reviewed:    The following studies were reviewed today: ***  EKG:  EKG is *** ordered today.  The ekg ordered today demonstrates ***  Recent Labs: 08/15/2020: Magnesium 2.2 09/01/2020: ALT 51; TSH 1.403 12/13/2020: B Natriuretic Peptide 39.6; BUN 23; Creatinine, Ser 1.17; Hemoglobin 15.2; Platelets 209; Potassium 4.2; Sodium 137  Recent Lipid Panel    Component Value Date/Time   CHOL 183 06/19/2020 0120   TRIG 45 06/19/2020 0120   HDL 58 06/19/2020 0120   CHOLHDL 3.2 06/19/2020 0120   VLDL 9 06/19/2020 0120   LDLCALC 116 (H) 06/19/2020 0120    Physical Exam:    VS:  There were no vitals taken for this visit.    Wt Readings from Last 3 Encounters:  12/13/20 227 lb (103 kg)  12/05/20 221 lb 12.8 oz (100.6 kg)  11/12/20 222 lb 0.1 oz (100.7 kg)     GEN: *** Well nourished, well developed in no acute distress HEENT: Normal NECK: No JVD; No carotid bruits LYMPHATICS: No lymphadenopathy CARDIAC: ***RRR, no murmurs, rubs, gallops RESPIRATORY:  Clear to auscultation without rales, wheezing or rhonchi  ABDOMEN: Soft, non-tender, non-distended MUSCULOSKELETAL:  No edema; No deformity  SKIN: Warm and dry NEUROLOGIC:  Alert and oriented x 3 PSYCHIATRIC:  Normal affect   ASSESSMENT:    No diagnosis found. PLAN:    Chronic Combined Systolic and Diastolic HF: admission 35/36 for a/c systolic heart failure>>Cardiogenic shock requiring milrinone, in setting of Afib w/ RVR.  EF 35-40% in 9/21 -> reduced down to 20% on repeat study 11/21.   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 Cath with normal cors. Echo today 12/05/20  EF 50-55% RV normal  - Continue amiodarone to maintain sinus rhythm - Continuelasix 80 mg po daily.  - Continue spiro 25 mg daily.  - Entresto 49/51 BID - Continue Farxiga 10 mg daily   - Continue Toprol XL 25 mg daily.    Persistent Atrial Fibrillation s/p DC-CV 9/21. Had ERAF>>underwent repeat DCCV 11/21 back to NSR.  Has been seen in AF Clinic -> remains in [place] 12/21 with no AF -Continue amiodarone 200 mg once a day  - Continue Toprol XL 25 mg daily.  - Continueeliquis5 mg bid. Denies abnormal bleeding.  - suspect underlying OSA (daytime somnolence and snorning). Set up home sleep study.     HTN: Continue Entresto, metoprolol, spironolactone as above  Probable OSA: sleep study  RTC in ***  Medication Adjustments/Labs and  Tests Ordered: Current medicines are reviewed at length with the patient today.  Concerns regarding medicines are outlined above.  No orders of the defined types were placed in this encounter.  No orders of the defined types were placed in this encounter.   There are no Patient Instructions on file for this visit.   Signed, Donato Heinz, MD  12/14/2020 9:10 PM    [facility]

## 2020-12-16 ENCOUNTER — Other Ambulatory Visit: Payer: Self-pay | Admitting: Registered Nurse

## 2020-12-16 MED FILL — ELIQUIS 5 MG TABLET: 5 | 30 days supply | Qty: 60 | Fill #0

## 2020-12-16 MED FILL — CEPHALEXIN 500 MG CAPSULE: 500 | 7 days supply | Qty: 14 | Fill #0

## 2020-12-16 MED FILL — SPIRONOLACTONE 25 MG TABLET: 25 | 30 days supply | Qty: 30 | Fill #0

## 2020-12-16 MED FILL — FUROSEMIDE 80 MG TAB: 80 | 30 days supply | Qty: 30 | Fill #0

## 2020-12-17 ENCOUNTER — Ambulatory Visit: Payer: Medicare HMO | Admitting: Cardiology

## 2021-01-02 ENCOUNTER — Ambulatory Visit (INDEPENDENT_AMBULATORY_CARE_PROVIDER_SITE_OTHER): Payer: Medicare Other | Admitting: Cardiology

## 2021-01-02 ENCOUNTER — Encounter: Payer: Self-pay | Admitting: Cardiology

## 2021-01-02 ENCOUNTER — Other Ambulatory Visit: Payer: Self-pay | Admitting: Cardiology

## 2021-01-02 ENCOUNTER — Other Ambulatory Visit: Payer: Self-pay

## 2021-01-02 VITALS — BP 128/83 | HR 79 | Ht 66.0 in | Wt 227.8 lb

## 2021-01-02 DIAGNOSIS — I10 Essential (primary) hypertension: Secondary | ICD-10-CM | POA: Diagnosis not present

## 2021-01-02 DIAGNOSIS — I5042 Chronic combined systolic (congestive) and diastolic (congestive) heart failure: Secondary | ICD-10-CM | POA: Diagnosis not present

## 2021-01-02 DIAGNOSIS — I4819 Other persistent atrial fibrillation: Secondary | ICD-10-CM | POA: Diagnosis not present

## 2021-01-02 DIAGNOSIS — I480 Paroxysmal atrial fibrillation: Secondary | ICD-10-CM

## 2021-01-02 DIAGNOSIS — Z720 Tobacco use: Secondary | ICD-10-CM

## 2021-01-02 LAB — COMPREHENSIVE METABOLIC PANEL
ALT: 55 IU/L — ABNORMAL HIGH (ref 0–44)
AST: 48 IU/L — ABNORMAL HIGH (ref 0–40)
Albumin/Globulin Ratio: 1.2 (ref 1.2–2.2)
Albumin: 4.2 g/dL (ref 3.8–4.8)
Alkaline Phosphatase: 96 IU/L (ref 44–121)
BUN/Creatinine Ratio: 22 (ref 10–24)
BUN: 30 mg/dL — ABNORMAL HIGH (ref 8–27)
Bilirubin Total: 0.4 mg/dL (ref 0.0–1.2)
CO2: 23 mmol/L (ref 20–29)
Calcium: 9.6 mg/dL (ref 8.6–10.2)
Chloride: 102 mmol/L (ref 96–106)
Creatinine, Ser: 1.38 mg/dL — ABNORMAL HIGH (ref 0.76–1.27)
Globulin, Total: 3.5 g/dL (ref 1.5–4.5)
Glucose: 96 mg/dL (ref 65–99)
Potassium: 5 mmol/L (ref 3.5–5.2)
Sodium: 140 mmol/L (ref 134–144)
Total Protein: 7.7 g/dL (ref 6.0–8.5)
eGFR: 57 mL/min/{1.73_m2} — ABNORMAL LOW (ref >59)

## 2021-01-02 LAB — TSH: TSH: 0.963 u[IU]/mL (ref 0.450–4.500)

## 2021-01-02 LAB — SPECIMEN STATUS REPORT

## 2021-01-02 MED ORDER — NICOTINE 7 MG/24HR TD PT24: 7.0000 mg | MEDICATED_PATCH | Freq: Every day | TRANSDERMAL | 0 refills | Status: DC

## 2021-01-02 MED FILL — NICOTINE 7 MG/24HR PATCH: 7 | 14 days supply | Qty: 14 | Fill #0

## 2021-01-02 NOTE — Patient Instructions (Signed)
Medication Instructions:  Nicotine patches-place once patch daily onto skin  *If you need a refill on your cardiac medications before your next appointment, please call your pharmacy*   Lab Work: CMET, TSH today  If you have labs (blood work) drawn today and your tests are completely normal, you will receive your results only by: [place] MyChart Message (if you have MyChart) OR . A paper copy in the mail If you have any lab test that is abnormal or we need to change your treatment, we will call you to review the results.  Testing/Procedures: Follow up on home sleep study  Follow-Up: At [facility], you and your health needs are our priority.  As part of our continuing mission to provide you with exceptional heart care, we have created designated Provider Care Teams.  These Care Teams include your primary Cardiologist (physician) and Advanced Practice Providers (APPs -  Physician Assistants and Nurse Practitioners) who all work together to provide you with the care you need, when you need it.  We recommend signing up for the patient portal called "MyChart".  Sign up information is provided on this After Visit Summary.  MyChart is used to connect with patients for Virtual Visits (Telemedicine).  Patients are able to view lab/test results, encounter notes, upcoming appointments, etc.  Non-urgent messages can be sent to your provider as well.   To learn more about what you can do with MyChart, go to [email or web address].    Your next appointment:   3 month(s)  The format for your next appointment:   In Person  Provider:   Oswaldo Milian, MD   Other Instructions Weigh yourself daily, write it down.  Call if you gain 3 lbs overnight or 5 lbs in 1 week

## 2021-01-02 NOTE — Progress Notes (Signed)
Cardiology Office Note:    Date:  01/04/2021   ID:  Christopher Wilkins, DOB 1956-02-22, MRN [id number]  PCP:  [name], NP  Cardiologist:  Donato Heinz, MD  Electrophysiologist:  None   Referring MD: [name], MD   Chief Complaint  Patient presents with   Congestive Heart Failure    History of Present Illness:    Christopher Wilkins is a 65 y.o. male with a hx of chronic combined systolic congestive heart failure, atrial fibrillation, tobacco use, hypertension who presents for an initial visit.  He previously followed with Dr. Haroldine Laws in the advanced heart failure clinic.  He was admitted in September 2021 with acute combined heart failure and atrial fibrillation.  Echocardiogram showed LVEF 35 to 40%.  He underwent TEE/DCCV with restoration of sinus rhythm.  He was discharged on Eliquis, Entresto, metoprolol, and Lasix.  He was readmitted in November 2021 with acute heart failure and atrial fibrillation.  Echocardiogram showed EF was down to 20%.  He was found to be in cardiogenic shock with AKI, lactic acidosis, coox 44%.  He was started on milrinone and diuresed with IV Lasix.  RHC/LHC showed normal coronary arteries.  He was able to be weaned off milrinone and eventually underwent successful TEE/DCCV with restoration of sinus rhythm.  Hospital follow-up in December he was found to be back in atrial fibrillation.  Was referred to A. fib clinic and noted to be back in sinus rhythm.  Zio patch was placed, which showed no atrial fibrillation.  Echocardiogram on 12/05/2020 showed EF 50 to 55%, normal RV function, likely mild AS.  He reports that he has been doing well.  Denies any chest pain, dyspnea, lightheadedness, syncope, lower extremity edema, or palpitations. Walks daily for about an hour, reports no exertional symptoms.  Has been taking Eliquis, denies any bleeding issues.   Wt Readings from Last 3 Encounters:  01/02/21 227 lb 12.8 oz (103.3 kg)  12/13/20 227 lb  (103 kg)  12/05/20 221 lb 12.8 oz (100.6 kg)     Past Medical History:  Diagnosis Date   Atrial fibrillation (HCC)    CHF (congestive heart failure) ([place])    Dyspnea    Hypertension    Insomnia    Prediabetes     Past Surgical History:  Procedure Laterality Date   CARDIOVERSION N/A 06/20/2020   Procedure: CARDIOVERSION;  Surgeon: Jerline Pain, MD;  Location: [place];  Service: Cardiovascular;  Laterality: N/A;   CARDIOVERSION N/A 08/20/2020   Procedure: CARDIOVERSION;  Surgeon: [name], MD;  Location: [place];  Service: Cardiovascular;  Laterality: N/A;   CHOLECYSTECTOMY     OTHER SURGICAL HISTORY     "Shot a couple of times"   RIGHT/LEFT HEART CATH AND CORONARY ANGIOGRAPHY N/A 08/18/2020   Procedure: RIGHT/LEFT HEART CATH AND CORONARY ANGIOGRAPHY;  Surgeon: [name], MD;  Location: [place] CV LAB;  Service: Cardiovascular;  Laterality: N/A;   TEE WITHOUT CARDIOVERSION N/A 06/20/2020   Procedure: TRANSESOPHAGEAL ECHOCARDIOGRAM (TEE);  Surgeon: Jerline Pain, MD;  Location: [facility] ENDOSCOPY;  Service: Cardiovascular;  Laterality: N/A;   TEE WITHOUT CARDIOVERSION N/A 08/20/2020   Procedure: TRANSESOPHAGEAL ECHOCARDIOGRAM (TEE);  Surgeon: [name], MD;  Location: [facility] ENDOSCOPY;  Service: Cardiovascular;  Laterality: N/A;    Current Medications: Current Meds  Medication Sig   amiodarone (PACERONE) 200 MG tablet Take 1 tablet (200 mg total) by mouth daily.   apixaban (ELIQUIS) 5 MG TABS tablet Take  1 tablet (5 mg total) by mouth 2 (two) times daily.   atorvastatin (LIPITOR) 40 MG tablet Take 1 tablet (40 mg total) by mouth daily.   Blood Pressure Monitor DEVI Please provide patient with insurance approved blood pressure monitor I10.0   dapagliflozin propanediol (FARXIGA) 10 MG TABS tablet Take 1 tablet (10 mg total) by mouth daily before breakfast.   furosemide (LASIX) 80 MG tablet Take 1 tablet (80 mg total) by mouth  daily.   metoprolol succinate (TOPROL XL) 25 MG 24 hr tablet Take 1 tablet (25 mg total) by mouth daily.   Misc. Devices (GNP DIGITAL WEIGHT SCALE) MISC 1 Device by Does not apply route daily. Please provide patient with insurance approved scale ICD110 I50.2   [DISCONTINUED] nicotine (NICODERM CQ - DOSED IN MG/24 HR) 7 mg/24hr patch Place 1 patch (7 mg total) onto the skin daily for 14 days.     Allergies:   Codeine, Lisinopril, and Shellfish allergy   Social History   Socioeconomic History   Marital status: Single    Spouse name: Not on file   Number of children: Not on file   Years of education: Not on file   Highest education level: Not on file  Occupational History   Not on file  Tobacco Use   Smoking status: Current Some Day Smoker    Packs/day: 0.25    Types: Cigarettes    Last attempt to quit: 05/14/2020    Years since quitting: 0.6   Smokeless tobacco: Never Used   Tobacco comment: 1-2 cigarettes daily  Vaping Use   Vaping Use: Never used  Substance and Sexual Activity   Alcohol use: Yes    Alcohol/week: 2.0 standard drinks    Types: 2 Cans of beer per week    Comment: occasionally; once every 2 weeks   Drug use: No   Sexual activity: Not on file  Other Topics Concern   Not on file  Social History Narrative   Not on file   Social Determinants of Health   Financial Resource Strain: Not on file  Food Insecurity: Not on file  Transportation Needs: Not on file  Physical Activity: Not on file  Stress: Not on file  Social Connections: Not on file     Family History: The patient's family history is negative for Colon cancer and Depression.  ROS:   Please see the history of present illness.     All other systems reviewed and are negative.  EKGs/Labs/Other Studies Reviewed:    The following studies were reviewed today:   EKG:  EKG is  ordered today.  The ekg ordered today demonstrates normal sinus rhythm, rate 79, nonspecific T wave  flattening, QTC 483  Recent Labs: 08/15/2020: Magnesium 2.2 12/13/2020: B Natriuretic Peptide 39.6; Hemoglobin 15.2; Platelets 209 01/02/2021: ALT 55; BUN 30; Creatinine, Ser 1.38; Potassium 5.0; Sodium 140; TSH 0.963  Recent Lipid Panel    Component Value Date/Time   CHOL 183 06/19/2020 0120   TRIG 45 06/19/2020 0120   HDL 58 06/19/2020 0120   CHOLHDL 3.2 06/19/2020 0120   VLDL 9 06/19/2020 0120   LDLCALC 116 (H) 06/19/2020 0120    Physical Exam:    VS:  BP 128/83    Pulse 79    Ht 5\' 6"  (1.676 m)    Wt 227 lb 12.8 oz (103.3 kg)    BMI 36.77 kg/m     Wt Readings from Last 3 Encounters:  01/02/21 227 lb 12.8 oz (103.3 kg)  12/13/20 227 lb (103 kg)  12/05/20 221 lb 12.8 oz (100.6 kg)     GEN: Well nourished, well developed in no acute distress HEENT: Normal NECK: No JVD; No carotid bruits CARDIAC: RRR, no murmurs, rubs, gallops RESPIRATORY:  Clear to auscultation without rales, wheezing or rhonchi  ABDOMEN: Soft, non-tender, non-distended MUSCULOSKELETAL:  No edema; No deformity  SKIN: Warm and dry NEUROLOGIC:  Alert and oriented x 3 PSYCHIATRIC:  Normal affect   ASSESSMENT:    1. Chronic combined systolic and diastolic heart failure (HCC)   2. Persistent atrial fibrillation ([place])   3. Essential hypertension   4. Tobacco use    PLAN:    Chronic Combined Systolic and Diastolic HF: admission 33/35 for a/c systolic heart failure>>Cardiogenic shock requiring milrinone, in setting of Afib w/ RVR.  EF 35-40% in 9/21 -> reduced down to 20% on repeat study 11/21.  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 Cath with normal cors. Echo today 12/05/20  EF 50-55% RV normal  - Continue amiodarone to maintain sinus rhythm - Continuelasix 80 mg po daily. Check CMP - Continue spiro 25 mg daily.  - Continue Entresto 49/51 BID - Continue Farxiga 10 mg daily   - Continue Toprol XL 25 mg daily.    Persistent Atrial Fibrillation s/p DC-CV 9/21. Had ERAF>>underwent repeat DCCV 11/21 back to NSR.  Has  been seen in AF Clinic -> remains in [place] 12/21 with no AF -Continue amiodarone 200 mg once a day.  Check CMP, TSH - Continue Toprol XL 25 mg daily.  - Continueeliquis5 mg bid. Denies abnormal bleeding.  - suspect underlying OSA (daytime somnolence and snorning). Check sleep study   HTN: Continue Entresto, metoprolol, spironolactone as above  Probable OSA: sleep study  Tobacco use: down to 4 cigarettes per week.  Wants to quit, would like to try nicotine patches.  RTC in 3 months  Medication Adjustments/Labs and Tests Ordered: Current medicines are reviewed at length with the patient today.  Concerns regarding medicines are outlined above.  Orders Placed This Encounter  Procedures   Comprehensive metabolic panel   TSH   Specimen status report   EKG 12-Lead   Meds ordered this encounter  Medications   DISCONTD: nicotine (NICODERM CQ - DOSED IN MG/24 HR) 7 mg/24hr patch    Sig: Place 1 patch (7 mg total) onto the skin daily for 14 days.    Dispense:  14 patch    Refill:  0   nicotine (NICODERM CQ - DOSED IN MG/24 HR) 7 mg/24hr patch    Sig: Place 1 patch (7 mg total) onto the skin daily for 14 days.    Dispense:  14 patch    Refill:  0    Patient Instructions  Medication Instructions:  Nicotine patches-place once patch daily onto skin  *If you need a refill on your cardiac medications before your next appointment, please call your pharmacy*   Lab Work: CMET, TSH today  If you have labs (blood work) drawn today and your tests are completely normal, you will receive your results only by:  [place] (if you have MyChart) OR  A paper copy in the mail If you have any lab test that is abnormal or we need to change your treatment, we will call you to review the results.  Testing/Procedures: Follow up on home sleep study  Follow-Up: At [facility], you and your health needs are our priority.  As part of our continuing mission to provide you with  exceptional heart care, we have created designated Provider Care Teams.  These Care Teams include your primary Cardiologist (physician) and Advanced Practice Providers (APPs -  Physician Assistants and Nurse Practitioners) who all work together to provide you with the care you need, when you need it.  We recommend signing up for the patient portal called "MyChart".  Sign up information is provided on this After Visit Summary.  MyChart is used to connect with patients for Virtual Visits (Telemedicine).  Patients are able to view lab/test results, encounter notes, upcoming appointments, etc.  Non-urgent messages can be sent to your provider as well.   To learn more about what you can do with MyChart, go to [email or web address].    Your next appointment:   3 month(s)  The format for your next appointment:   In Person  Provider:   Oswaldo Milian, MD   Other Instructions Weigh yourself daily, write it down.  Call if you gain 3 lbs overnight or 5 lbs in 1 week     Signed, Donato Heinz, MD  01/04/2021 10:08 PM    [place]

## 2021-01-06 ENCOUNTER — Other Ambulatory Visit: Payer: Self-pay | Admitting: Registered Nurse

## 2021-01-06 MED FILL — ATORVASTATIN CALCIUM 40 MG: 40 | 90 days supply | Qty: 90 | Fill #0

## 2021-01-06 MED FILL — AMIODARONE HCL 200 MG TAB: 200 | 90 days supply | Qty: 90 | Fill #0

## 2021-01-10 ENCOUNTER — Other Ambulatory Visit: Payer: Self-pay

## 2021-01-14 ENCOUNTER — Other Ambulatory Visit: Payer: Self-pay | Admitting: Nurse Practitioner

## 2021-01-14 ENCOUNTER — Other Ambulatory Visit: Payer: Self-pay

## 2021-01-14 MED FILL — Furosemide Tab 80 MG: ORAL | 90 days supply | Qty: 90 | Fill #0 | Status: AC

## 2021-01-14 MED FILL — Spironolactone Tab 25 MG: ORAL | 90 days supply | Qty: 90 | Fill #0 | Status: AC

## 2021-01-14 NOTE — Telephone Encounter (Signed)
Requested medication (s) are due for refill today:   Yes  Requested medication (s) are on the active medication list:   Yes  Future visit scheduled:   No   Last ordered: 12/16/2020 #60, 0 refills  Clinic note:   Not sure where to send this.   It was prescribed by Arthur Holms, NP with [place].      Requested Prescriptions  Pending Prescriptions Disp Refills   apixaban (ELIQUIS) 5 MG TABS tablet 60 tablet 0    Sig: TAKE 1 TABLET (5 MG) BY ORAL ROUTE 2 TIMES PER DAY FOR 30 DAYS      Hematology:  Anticoagulants Failed - 01/14/2021  2:18 PM      Failed - Cr in normal range and within 360 days    Creatinine, Ser  Date Value Ref Range Status  01/02/2021 1.38 (H) 0.76 - 1.27 mg/dL Final          Passed - HGB in normal range and within 360 days    Hemoglobin  Date Value Ref Range Status  12/13/2020 15.2 13.0 - 17.0 g/dL Final  07/11/2020 15.4 13.0 - 17.7 g/dL Final   Total hemoglobin  Date Value Ref Range Status  08/21/2020 16.0 12.0 - 16.0 g/dL Final          Passed - PLT in normal range and within 360 days    Platelets  Date Value Ref Range Status  12/13/2020 209 150 - 400 K/uL Final  07/11/2020 258 150 - 450 x10E3/uL Final          Passed - HCT in normal range and within 360 days    HCT  Date Value Ref Range Status  12/13/2020 47.8 39.0 - 52.0 % Final   Hematocrit  Date Value Ref Range Status  07/11/2020 48.3 37.5 - 51.0 % Final          Passed - Valid encounter within last 12 months    Recent Outpatient Visits           2 months ago Encounter to establish care   [place], Vernia Buff, NP   6 months ago Atrial fibrillation with [facility])   [place] Antony Blackbird, MD       Future Appointments             In 2 months Donato Heinz, MD [place], [facility]

## 2021-01-15 ENCOUNTER — Other Ambulatory Visit: Payer: Self-pay | Admitting: Registered Nurse

## 2021-01-15 ENCOUNTER — Other Ambulatory Visit: Payer: Self-pay

## 2021-01-16 ENCOUNTER — Other Ambulatory Visit: Payer: Self-pay

## 2021-01-19 ENCOUNTER — Other Ambulatory Visit: Payer: Self-pay

## 2021-01-22 ENCOUNTER — Other Ambulatory Visit: Payer: Self-pay

## 2021-01-26 ENCOUNTER — Other Ambulatory Visit: Payer: Self-pay

## 2021-01-26 MED ORDER — ELIQUIS 5 MG PO TABS
ORAL_TABLET | ORAL | 0 refills | Status: DC
  Filled 2021-01-26: qty 180, 90d supply, fill #0
  Filled 2021-01-26: qty 60, 30d supply, fill #0

## 2021-01-28 ENCOUNTER — Ambulatory Visit: Payer: Medicare HMO | Admitting: Nurse Practitioner

## 2021-02-16 ENCOUNTER — Telehealth (HOSPITAL_COMMUNITY): Payer: Self-pay | Admitting: Surgery

## 2021-02-16 NOTE — Telephone Encounter (Signed)
I called Christopher Wilkins after seeing that he never picked up his home sleep study from the front desk area to redo the study. He tells me that he "forgot" to come pick up the test.  I reviewed with him how to perform the test at home and he agrees to come by tomorrow to get the device.  I asked that he call me back with any concerns or questions regarding the home sleep study.

## 2021-02-19 ENCOUNTER — Encounter (INDEPENDENT_AMBULATORY_CARE_PROVIDER_SITE_OTHER): Payer: Medicare Other | Admitting: Cardiology

## 2021-02-19 DIAGNOSIS — G4733 Obstructive sleep apnea (adult) (pediatric): Secondary | ICD-10-CM

## 2021-02-20 ENCOUNTER — Encounter: Payer: Self-pay | Admitting: Physician Assistant

## 2021-02-20 ENCOUNTER — Ambulatory Visit (INDEPENDENT_AMBULATORY_CARE_PROVIDER_SITE_OTHER): Payer: Medicare Other | Admitting: Physician Assistant

## 2021-02-20 ENCOUNTER — Other Ambulatory Visit: Payer: Self-pay

## 2021-02-20 VITALS — BP 120/76 | HR 72 | Ht 67.0 in | Wt 236.0 lb

## 2021-02-20 DIAGNOSIS — R7303 Prediabetes: Secondary | ICD-10-CM

## 2021-02-20 DIAGNOSIS — R635 Abnormal weight gain: Secondary | ICD-10-CM | POA: Diagnosis not present

## 2021-02-20 DIAGNOSIS — I5022 Chronic systolic (congestive) heart failure: Secondary | ICD-10-CM

## 2021-02-20 DIAGNOSIS — I10 Essential (primary) hypertension: Secondary | ICD-10-CM

## 2021-02-20 DIAGNOSIS — I480 Paroxysmal atrial fibrillation: Secondary | ICD-10-CM | POA: Diagnosis not present

## 2021-02-20 NOTE — Progress Notes (Signed)
Cardiology Office Note:    Date:  02/21/2021   ID:  Christopher Wilkins, DOB Mar 24, 1956, MRN [id number]  PCP:  [name], NP   [facility] Providers Cardiologist:  Donato Heinz, MD     Referring MD: [name], NP   Chief Complaint  Patient presents with  . Follow-up    Seen for Dr. Gardiner Rhyme    History of Present Illness:    Christopher Wilkins is a 65 y.o. male with a hx of hypertension, prediabetes, atrial fibrillation, tobacco use and chronic systolic heart failure.  Patient was admitted in September 2021 with acute onset of CHF and A. fib.  Echocardiogram obtained at the time showed EF 35 to 40%.  Patient subsequently underwent TEE/DCCV.  He was discharged on Eliquis, Entresto, Toprol and Lasix.  Patient was readmitted in November 2021 with recurrent heart failure and recurrent atrial fibrillation.  EF down to 20% by repeat echocardiogram.  He also developed cardiogenic shock with AKI and lactic acidosis.  During the hospitalization, he was started on milrinone.  He underwent IV diuresis.  Left and right heart cath showed a normal coronary artery, well compensated hemodynamic neurinoma.  He was able to wean off of milrinone and underwent repeat TEE/DCCV back to sinus rhythm.  Unfortunately by the time he was followed up in December in the clinic, he was already back in atrial fibrillation with heart rate in the upper 90s.  He was referred to the A. fib clinic.  By the time he was seen in A. fib clinic, he was already back in sinus rhythm.  Zio patch placed which shows no A. fib but brief atrial tachycardia.  Repeat echocardiogram in February 2022 shows EF has improved to 55%, normal RV.  He was last seen by Dr. Haroldine Laws on 12/05/2020, digoxin was discontinued, his Delene Loll was increased to 49/51 mg twice a day.  He was continued on Farxiga, Toprol-XL and spironolactone along with 80 mg daily of Lasix.  He was treated for left lower extremity cellulitis in the ED on 12/13/2020.   Patient was graduated from heart failure clinic and scheduled to see Dr. Gardiner Rhyme.  He was last seen by Dr. Gardiner Rhyme on 01/02/2021 at which time he was doing well without any lower extremity edema.  His weight was 227 pounds.  He was referred for sleep study for probable obstructive sleep apnea.  Patient presents today for early follow-up due to concern of weight gain.  He is current weight is 236 pounds which is 9 pounds higher than the previous dry weight in March.  He says he has been eating more recently as well.  On exam I do not see any lower extremity edema, his lung is clear, he is holding sinus rhythm based on physical exam, and I do not see any JVD either.  I suspect his recent weight gain is true weight gain related to calorie intake.  He appears to be euvolemic based on my physical exam.  He denies any chest discomfort or worsening dyspnea.  He can follow-up in 3 months with Dr. Gardiner Rhyme.   Per patient, he is due for colonoscopy next month by [facility].  He has been instructed to hold Eliquis for 2 days prior to the procedure and restart as soon as possible afterward at the surgeon's discretion.  He may hold Lasix and the spironolactone in the morning of the procedure.  Otherwise he is cleared to proceed with colonoscopy.  Past Medical History:  Diagnosis Date  . Atrial fibrillation ([place])   . CHF (congestive heart failure) ([place])   . Dyspnea   . Hypertension   . Insomnia   . Prediabetes     Past Surgical History:  Procedure Laterality Date  . CARDIOVERSION N/A 06/20/2020   Procedure: CARDIOVERSION;  Surgeon: Jerline Pain, MD;  Location: [facility] ENDOSCOPY;  Service: Cardiovascular;  Laterality: N/A;  . CARDIOVERSION N/A 08/20/2020   Procedure: CARDIOVERSION;  Surgeon: [name], MD;  Location: [facility] ENDOSCOPY;  Service: Cardiovascular;  Laterality: N/A;  . CHOLECYSTECTOMY    . OTHER SURGICAL HISTORY     "Shot a couple of times"  . RIGHT/LEFT HEART CATH AND CORONARY  ANGIOGRAPHY N/A 08/18/2020   Procedure: RIGHT/LEFT HEART CATH AND CORONARY ANGIOGRAPHY;  Surgeon: [name], MD;  Location: [facility] CV LAB;  Service: Cardiovascular;  Laterality: N/A;  . TEE WITHOUT CARDIOVERSION N/A 06/20/2020   Procedure: TRANSESOPHAGEAL ECHOCARDIOGRAM (TEE);  Surgeon: Jerline Pain, MD;  Location: [facility] ENDOSCOPY;  Service: Cardiovascular;  Laterality: N/A;  . TEE WITHOUT CARDIOVERSION N/A 08/20/2020   Procedure: TRANSESOPHAGEAL ECHOCARDIOGRAM (TEE);  Surgeon: [name], MD;  Location: [facility] ENDOSCOPY;  Service: Cardiovascular;  Laterality: N/A;    Current Medications: Current Meds  Medication Sig  . amiodarone (PACERONE) 200 MG tablet TAKE 1 TABLET (200 MG) BY ORAL ROUTE ONCE DAILY  . apixaban (ELIQUIS) 5 MG TABS tablet TAKE 1 TABLET (5 MG TOTAL) BY MOUTH 2 (TWO) TIMES DAILY.  Marland Kitchen atorvastatin (LIPITOR) 40 MG tablet TAKE 1 TABLET (40 MG) BY ORAL ROUTE ONCE DAILY AT BEDTIME  . dapagliflozin propanediol (FARXIGA) 10 MG TABS tablet TAKE 1 TABLET (10 MG TOTAL) BY MOUTH DAILY BEFORE BREAKFAST.  . furosemide (LASIX) 80 MG tablet TAKE 1 TABLET (80 MG TOTAL) BY MOUTH DAILY.  . metoprolol succinate (TOPROL-XL) 25 MG 24 hr tablet TAKE 1 TABLET (25 MG TOTAL) BY MOUTH DAILY.  Marland Kitchen spironolactone (ALDACTONE) 25 MG tablet TAKE 1 TABLET (25 MG) BY ORAL ROUTE ONCE DAILY     Allergies:   Codeine, Lisinopril, and Shellfish allergy   Social History   Socioeconomic History  . Marital status: Single    Spouse name: Not on file  . Number of children: Not on file  . Years of education: Not on file  . Highest education level: Not on file  Occupational History  . Not on file  Tobacco Use  . Smoking status: Current Some Day Smoker    Packs/day: 0.25    Types: Cigarettes    Last attempt to quit: 05/14/2020    Years since quitting: 0.7  . Smokeless tobacco: Never Used  . Tobacco comment: 1-2 cigarettes daily  Vaping Use  . Vaping Use: Never used  Substance and Sexual  Activity  . Alcohol use: Yes    Alcohol/week: 2.0 standard drinks    Types: 2 Cans of beer per week    Comment: occasionally; once every 2 weeks  . Drug use: No  . Sexual activity: Not on file  Other Topics Concern  . Not on file  Social History Narrative  . Not on file   Social Determinants of Health   Financial Resource Strain: Not on file  Food Insecurity: Not on file  Transportation Needs: Not on file  Physical Activity: Not on file  Stress: Not on file  Social Connections: Not on file     Family History: The patient's family history is negative for Colon cancer and Depression.  ROS:   Please  see the history of present illness.     All other systems reviewed and are negative.  EKGs/Labs/Other Studies Reviewed:    The following studies were reviewed today:  Left and right heart cath 08/18/2020 On milrinone 0.125 mcg/kg/min  Ao = 94/63 (76) LV = 101/18 RA = 9 RV = 38/11 PA = 37/18 (24) PCW =16 Fick cardiac output/index = 5.5/2.8 PVR = 1.5 WU FA sat = 95% PA sat = 71%, 72%  Assessment: 1. Normal coronary arteries 2. Severe NICM EF 15-20% (suspect AF related) 3. Well compensated hemodynamics on milrinone 4. 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 Switch to apixaban later today. TEE/DC-CV on Wednesday am.      Echo 12/05/2020 1. E/E' may not be accurate due to MAC 62952. Left ventricular ejection  fraction, by estimation, is 50 to 55%. The left ventricle has low normal  function. The left ventricle has no regional wall motion abnormalities.  The left ventricular internal cavity  size was mildly dilated. There is mild left ventricular hypertrophy. Left  ventricular diastolic parameters are consistent with Grade I diastolic  dysfunction (impaired relaxation).  2. Right ventricular systolic function is normal. The right ventricular  size is normal.  3. Degenerative with calcified subchordal structures . The mitral valve  is  abnormal. Trivial mitral valve regurgitation. No evidence of mitral  stenosis. Moderate mitral annular calcification.  4. Likely very mild AS peak velocity 66msec gardients / AVA not done .  The aortic valve is calcified. There is moderate calcification of the  aortic valve. Aortic valve regurgitation is not visualized. Mild to  moderate aortic valve  sclerosis/calcification is present, without any evidence of aortic  stenosis.  5. The inferior vena cava is normal in size with greater than 50%  respiratory variability, suggesting right atrial pressure of 3 mmHg.   EKG:  EKG is not ordered today.   Recent Labs: 08/15/2020: Magnesium 2.2 12/13/2020: B Natriuretic Peptide 39.6; Hemoglobin 15.2; Platelets 209 01/02/2021: ALT 55; BUN 30; Creatinine, Ser 1.38; Potassium 5.0; Sodium 140; TSH 0.963  Recent Lipid Panel    Component Value Date/Time   CHOL 183 06/19/2020 0120   TRIG 45 06/19/2020 0120   HDL 58 06/19/2020 0120   CHOLHDL 3.2 06/19/2020 0120   VLDL 9 06/19/2020 0120   LDLCALC 116 (H) 06/19/2020 0120     Risk Assessment/Calculations:    CHA2DS2-VASc Score = 3  This indicates a 3.2% annual risk of stroke. The patient's score is based upon: CHF History: Yes HTN History: Yes Diabetes History: No Stroke History: No Vascular Disease History: No Age Score: 1 Gender Score: 0      Physical Exam:    VS:  BP 120/76   Pulse 72   Ht _0  (1.702 m)   Wt 236 lb (107 kg)   SpO2 96%   BMI 36.96 kg/m     Wt Readings from Last 3 Encounters:  02/20/21 236 lb (107 kg)  01/02/21 227 lb 12.8 oz (103.3 kg)  12/13/20 227 lb (103 kg)     GEN:  Well nourished, well developed in no acute distress HEENT: Normal NECK: No JVD; No carotid bruits LYMPHATICS: No lymphadenopathy CARDIAC: RRR, no murmurs, rubs, gallops RESPIRATORY:  Clear to auscultation without rales, wheezing or rhonchi  ABDOMEN: Soft, non-tender, non-distended MUSCULOSKELETAL:  No edema; No deformity  SKIN: Warm  and dry NEUROLOGIC:  Alert and oriented x 3 PSYCHIATRIC:  Normal affect   ASSESSMENT:    1.  Weight gain   2. Chronic systolic heart failure ([place])   3. Primary hypertension   4. PAF (paroxysmal atrial fibrillation) ([place])   5. Pre-diabetes    PLAN:    In order of problems listed above:  1. Weight gain: Recent weight gain is likely related to calorie intake, he is euvolemic on physical exam.  2. Chronic systolic heart failure: EF normalized on last echocardiogram.  He is euvolemic on physical exam.  On Toprol-XL, spironolactone and Entresto.  3. Hypertension: Blood pressure stable  4. PAF: On Eliquis and amiodarone.  5. Prediabetes: Followed by primary care provider.        Medication Adjustments/Labs and Tests Ordered: Current medicines are reviewed at length with the patient today.  Concerns regarding medicines are outlined above.  No orders of the defined types were placed in this encounter.  No orders of the defined types were placed in this encounter.   Patient Instructions  Medication Instructions:  Your physician recommends that you continue on your current medications as directed. Please refer to the Current Medication list given to you today.  *If you need a refill on your cardiac medications before your next appointment, please call your pharmacy*  Lab Work: NONE ordered at this time of appointment   If you have labs (blood work) drawn today and your tests are completely normal, you will receive your results only by: Marland Kitchen MyChart Message (if you have MyChart) OR . A paper copy in the mail If you have any lab test that is abnormal or we need to change your treatment, we will call you to review the results.  Testing/Procedures: NONE ordered at this time of appointment   Follow-Up: At [facility], you and your health needs are our priority.  As part of our continuing mission to provide you with exceptional heart care, we have created designated Provider Care  Teams.  These Care Teams include your primary Cardiologist (physician) and Advanced Practice Providers (APPs -  Physician Assistants and Nurse Practitioners) who all work together to provide you with the care you need, when you need it.  We recommend signing up for the patient portal called "MyChart".  Sign up information is provided on this After Visit Summary.  MyChart is used to connect with patients for Virtual Visits (Telemedicine).  Patients are able to view lab/test results, encounter notes, upcoming appointments, etc.  Non-urgent messages can be sent to your provider as well.   To learn more about what you can do with MyChart, go to [email or web address].    Your next appointment:   3 month(s)  The format for your next appointment:   In Person  Provider:   Oswaldo Milian, MD  Other Instructions  You are cleared for your upcoming colonoscopy.   Hold Eliquis 2 days prior to procedure and restart as soon as possible afterwards at the discretion of the Surgeon.   Hold Lasix and Spironolactone the morning of the procedure      Hilbert Corrigan, PA  02/21/2021 11:35 PM    [facility]

## 2021-02-20 NOTE — Patient Instructions (Signed)
Medication Instructions:  Your physician recommends that you continue on your current medications as directed. Please refer to the Current Medication list given to you today.  *If you need a refill on your cardiac medications before your next appointment, please call your pharmacy*  Lab Work: NONE ordered at this time of appointment   If you have labs (blood work) drawn today and your tests are completely normal, you will receive your results only by: Marland Kitchen MyChart Message (if you have MyChart) OR . A paper copy in the mail If you have any lab test that is abnormal or we need to change your treatment, we will call you to review the results.  Testing/Procedures: NONE ordered at this time of appointment   Follow-Up: At [facility], you and your health needs are our priority.  As part of our continuing mission to provide you with exceptional heart care, we have created designated Provider Care Teams.  These Care Teams include your primary Cardiologist (physician) and Advanced Practice Providers (APPs -  Physician Assistants and Nurse Practitioners) who all work together to provide you with the care you need, when you need it.  We recommend signing up for the patient portal called "MyChart".  Sign up information is provided on this After Visit Summary.  MyChart is used to connect with patients for Virtual Visits (Telemedicine).  Patients are able to view lab/test results, encounter notes, upcoming appointments, etc.  Non-urgent messages can be sent to your provider as well.   To learn more about what you can do with MyChart, go to [email or web address].    Your next appointment:   3 month(s)  The format for your next appointment:   In Person  Provider:   Oswaldo Milian, MD  Other Instructions  You are cleared for your upcoming colonoscopy.   Hold Eliquis 2 days prior to procedure and restart as soon as possible afterwards at the discretion of the Surgeon.   Hold Lasix and  Spironolactone the morning of the procedure

## 2021-02-21 ENCOUNTER — Encounter: Payer: Self-pay | Admitting: Physician Assistant

## 2021-03-02 ENCOUNTER — Telehealth: Payer: Self-pay | Admitting: Cardiology

## 2021-03-02 NOTE — Telephone Encounter (Signed)
   [facility] Pre-operative Risk Assessment    Patient Name: Christopher Wilkins  DOB: 1956/10/03  MRN: [id number]   Request for surgical clearance:  1. What type of surgery is being performed? Colonoscopy    2. When is this surgery scheduled? 03/20/2021   3. What type of clearance is required (medical clearance vs. Pharmacy clearance to hold med vs. Both)? Both   4. Are there any medications that need to be held prior to surgery and how long? Eliquis   5. Practice name and name of physician performing surgery? [facility]   6. What is the office phone number? [phone]   7.   What is the office fax number? [phone]  8.   Anesthesia type (None, local, MAC, general) ? MAC   Sheral Apley M 03/02/2021, 2:46 PM  _________________________________________________________________   (provider comments below)

## 2021-03-03 NOTE — Telephone Encounter (Signed)
Will route to PharmD for rec's re: holding anticoagulation. Richardson Dopp, PA-C    03/03/2021 11:03 AM

## 2021-03-03 NOTE — Telephone Encounter (Signed)
Call placed to pt regarding medications.  He has 2 more bottles of Eliquis and about 60 tablets of Entresto left.  He stated he wasn't sure who refilled it for him, has he has seen so many physicians.  By the name he spelled, it's the same as the NP on his careteam that filled it for him.  Pt has been advised to let us know when he needed further refills.  He verbalized understanding.

## 2021-03-03 NOTE — Telephone Encounter (Signed)
Patient with diagnosis of afib on Eliquis for anticoagulation.    Procedure: colonoscopy Date of procedure: 03/20/21  CHA2DS2-VASc Score = 3  This indicates a 3.2% annual risk of stroke. The patient's score is based upon: CHF History: Yes HTN History: Yes Diabetes History: No Stroke History: No Vascular Disease History: No Age Score: 1 Gender Score: 0   CHF, HTN  CrCl 39mL/min using adjusted body weight Platelet count 209K  Per office protocol, patient can hold Eliquis for 1-2 days prior to procedure.   Would also see if pt is in need of medication refills - he had a 30 day supply of his Eliquis, Farxiga, furosemide, and metoprolol sent in in February and no further refills have been sent in from [facility] [employer], also can't see who has been prescribing his Entresto.

## 2021-03-03 NOTE — Telephone Encounter (Signed)
I will send notes to GI.  Please see note from PharmD.  Please reach out to patient to ask who is filling meds listed and if he needs refills sent to his pharmacy.  If pt is not taking meds listed, make note and send to cardiologist as FYI.  Richardson Dopp, PA-C    03/03/2021 12:50 PM

## 2021-03-11 ENCOUNTER — Ambulatory Visit: Payer: Medicare Other

## 2021-03-11 DIAGNOSIS — G4733 Obstructive sleep apnea (adult) (pediatric): Secondary | ICD-10-CM

## 2021-03-11 NOTE — Procedures (Signed)
   Sleep Study Report  Patient Information  Name: Christopher Wilkins  ID: [id number] Birth Date: 1956-07-07  Age: 65  Gender: Male Study Date: 02/19/2021 Referring Physician:Daniel Bensimhon, MD  TEST DESCRIPTION: Home sleep apnea testing was completed using the WatchPat, a Type 1 device, utilizing peripheral arterial tonometry (PAT), chest movement, actigraphy, pulse oximetry, pulse rate, body position and snore. AHI was calculated with apnea and hypopnea using valid sleep time as the denominator. RDI includes apneas, hypopneas, and RERAs. The data acquired and the scoring of sleep and all associated events were performed in accordance with the recommended standards and specifications as outlined in the AASM Manual for the Scoring of Sleep and Associated Events 2.2.0 (2015).  FINDINGS: 1. Moderate Obstructive Sleep Apnea with AHI 17.6/hr.  2. Minimal Central Sleep Apnea with pAHIc 2.7/hr. 3. Oxygen desaturations as low as 89%. 4. Moderate snoring was present. O2 sats were < 88% for 0 min. 5. Total sleep time was 5 hrs and 53 min. 6. 7.1% of total sleep time was spent in REM sleep.  7. Prolonged sleep onset latency at 52 min.  8. Prolonged REM sleep onset latency at 273 min.  9. Total awakenings were 26.   DIAGNOSIS:  Moderate Obstructive Sleep Apnea (G47.33)  RECOMMENDATIONS: 1. Clinical correlation of these findings is necessary. The decision to treat obstructive sleep apnea (OSA) is usually based on the presence of apnea symptoms or the presence of associated medical conditions such as Hypertension, Congestive Heart Failure, Atrial Fibrillation or Obesity. The most common symptoms of OSA are snoring, gasping for breath while sleeping, daytime sleepiness and fatigue.   2. Initiating apnea therapy is recommended given the presence of symptoms and/or associated conditions.  Recommend proceeding with one of the following:   a. Auto-CPAP therapy with a pressure range of 5-20cm H2O.   b.  An oral appliance (OA) that can be obtained from certain dentists with expertise in sleep medicine. These are primarily of use in non-obese patients with mild and moderate disease.   c. An ENT consultation which may be useful to look for specific causes of obstruction and possible treatment options.   d. If patient is intolerant to PAP therapy, consider referral to ENT for evaluation for hypoglossal nerve stimulator.   3. Close follow-up is necessary to ensure success with CPAP or oral appliance therapy for maximum benefit . 4. A follow-up oximetry study on CPAP is recommended to assess the adequacy of therapy and determine the need for supplemental oxygen or the potential need for Bi-level therapy. An arterial blood gas to determine the adequacy of baseline ventilation and oxygenation should also be considered.  5. Healthy sleep recommendations include: adequate nightly sleep (normal 7-9 hrs/night), avoidance of caffeine afternoon and alcohol near bedtime, and maintaining a sleep environment that is cool, dark and quiet.  6. Weight loss for overweight patients is recommended. Even modest amounts of weight loss can significantly improve the severity of sleep apnea.  7. Snoring recommendations include: weight loss where appropriate, side sleeping, and avoidance of alcohol before bed.  8. Operation of motor vehicle or dangerous equipment must be avoided when feeling drowsy, excessively sleepy, or mentally fatigued.  Report prepared by: Signature: Fransico Him, MD [facility]; [place], [place] Board of Sleep Medicine  Electronically Signed: Mar 11, 2021

## 2021-03-16 ENCOUNTER — Other Ambulatory Visit: Payer: Self-pay | Admitting: Cardiology

## 2021-03-16 DIAGNOSIS — G4733 Obstructive sleep apnea (adult) (pediatric): Secondary | ICD-10-CM

## 2021-03-18 ENCOUNTER — Telehealth: Payer: Self-pay | Admitting: *Deleted

## 2021-03-18 DIAGNOSIS — G4733 Obstructive sleep apnea (adult) (pediatric): Secondary | ICD-10-CM

## 2021-03-18 NOTE — Telephone Encounter (Signed)
-----   Message from Sueanne Margarita, MD sent at 03/11/2021 10:56 PM EDT ----- Please let patient know that they have sleep apnea and recommend treating with CPAP.  Please order an auto CPAP from 4-15cm H2O with heated humidity and mask of choice.  Order overnight pulse ox on CPAP.  Followup with me in 6 weeks.

## 2021-03-18 NOTE — Telephone Encounter (Signed)
The patient has been notified of the result and verbalized understanding.  All questions (if any) were answered. Christopher Wilkins, [place] 03/18/2021 11:08 AM   Upon patient request DME selection is [facility] Patient understands he will be contacted by [facility] to set up his cpap. Patient understands to call if [facility] does not contact him with new setup in a timely manner. Patient understands they will be called once confirmation has been received from [facility] that they have received their new machine to schedule 10 week follow up appointment.   [facility] notified of new cpap order  Please add to airview Patient was grateful for the call and thanked me

## 2021-04-03 ENCOUNTER — Ambulatory Visit: Payer: Medicare Other | Admitting: Cardiology

## 2021-05-08 ENCOUNTER — Telehealth: Payer: Self-pay | Admitting: Cardiology

## 2021-05-08 NOTE — Telephone Encounter (Signed)
Baxter Flattery from [facility] is calling in regards to when should this patient receive his CPAP machine. Patient is currently at [facility]. Baxter Flattery can be reached on her cellphone at [phone]. Please advise

## 2021-05-11 ENCOUNTER — Ambulatory Visit
Admission: RE | Admit: 2021-05-11 | Discharge: 2021-05-11 | Disposition: A | Discharge status: Home / Self Care | Payer: Medicare Other | Source: Ambulatory Visit | Attending: Registered Nurse | Admitting: Registered Nurse

## 2021-05-11 ENCOUNTER — Other Ambulatory Visit: Payer: Self-pay | Admitting: Registered Nurse

## 2021-05-11 DIAGNOSIS — R0602 Shortness of breath: Secondary | ICD-10-CM

## 2021-06-04 NOTE — Progress Notes (Signed)
Cardiology Office Note:    Date:  06/05/2021   ID:  Christopher Wilkins, Christopher Wilkins, Christopher Wilkins, MRN [id number]  PCP:  [name], NP  Cardiologist:  Donato Heinz, MD  Electrophysiologist:  None   Referring MD: [name], NP   No chief complaint on file.   History of Present Illness:    Christopher Wilkins is a 65 y.o. male with a hx of chronic combined systolic congestive heart failure, atrial fibrillation, tobacco use, hypertension who presents for follow-up.  He previously followed with Dr. Haroldine Laws in the advanced heart failure clinic.  He was admitted in September 2021 with acute combined heart failure and atrial fibrillation.  Echocardiogram showed LVEF 35 to 40%.  He underwent TEE/DCCV with restoration of sinus rhythm.  He was discharged on Eliquis, Entresto, metoprolol, and Lasix.  He was readmitted in November 2021 with acute heart failure and atrial fibrillation.  Echocardiogram showed EF was down to 20%.  He was found to be in cardiogenic shock with AKI, lactic acidosis, coox 44%.  He was started on milrinone and diuresed with IV Lasix.  RHC/LHC showed normal coronary arteries.  He was able to be weaned off milrinone and eventually underwent successful TEE/DCCV with restoration of sinus rhythm.  Hospital follow-up in December he was found to be back in atrial fibrillation.  Was referred to A. fib clinic and noted to be back in sinus rhythm.  Zio patch was placed, which showed no atrial fibrillation.  Echocardiogram on 12/05/2020 showed EF 50 to 55%, normal RV function, likely mild AS.  Since last clinic visit, he reports he has been doing okay.  Weight is up 20 pounds from visit in March 2022.  He has noted some dyspnea and lower extremity edema.  He received his CPAP machine yesterday.  He denies any chest pain, lightheadedness, syncope, palpitations.  He walks daily for 30 minutes.  Does report some dyspnea with walking.  He denies any bleeding issues.  He has decreased his smoking  to 5 cigarettes/week.  Wt Readings from Last 3 Encounters:  06/05/21 247 lb 6.4 oz (112.2 kg)  02/20/21 236 lb (107 kg)  01/02/21 227 lb 12.8 oz (103.3 kg)     Past Medical History:  Diagnosis Date   Atrial fibrillation (HCC)    CHF (congestive heart failure) ([place])    Dyspnea    Hypertension    Insomnia    Prediabetes     Past Surgical History:  Procedure Laterality Date   CARDIOVERSION N/A 06/20/2020   Procedure: CARDIOVERSION;  Surgeon: Jerline Pain, MD;  Location: [facility];  Service: Cardiovascular;  Laterality: N/A;   CARDIOVERSION N/A 08/20/2020   Procedure: CARDIOVERSION;  Surgeon: [name], MD;  Location: [facility];  Service: Cardiovascular;  Laterality: N/A;   CHOLECYSTECTOMY     OTHER SURGICAL HISTORY     "Shot a couple of times"   RIGHT/LEFT HEART CATH AND CORONARY ANGIOGRAPHY N/A 08/18/2020   Procedure: RIGHT/LEFT HEART CATH AND CORONARY ANGIOGRAPHY;  Surgeon: [name], MD;  Location: [facility];  Service: Cardiovascular;  Laterality: N/A;   TEE WITHOUT CARDIOVERSION N/A 06/20/2020   Procedure: TRANSESOPHAGEAL ECHOCARDIOGRAM (TEE);  Surgeon: Jerline Pain, MD;  Location: [facility];  Service: Cardiovascular;  Laterality: N/A;   TEE WITHOUT CARDIOVERSION N/A 08/20/2020   Procedure: TRANSESOPHAGEAL ECHOCARDIOGRAM (TEE);  Surgeon: [name], MD;  Location: [facility];  Service: Cardiovascular;  Laterality: N/A;    Current Medications: Current Meds  Medication Sig   amiodarone (PACERONE) 200 MG tablet TAKE 1 TABLET (200 MG) BY ORAL ROUTE ONCE DAILY   apixaban (ELIQUIS) 5 MG TABS tablet TAKE 1 TABLET (5 MG TOTAL) BY MOUTH 2 (TWO) TIMES DAILY.   atorvastatin (LIPITOR) 40 MG tablet TAKE 1 TABLET (40 MG) BY ORAL ROUTE ONCE DAILY AT BEDTIME   Blood Pressure Monitor DEVI Please provide patient with insurance approved blood pressure monitor I10.0   Cholecalciferol (VITAMIN D3 PO) Take by mouth daily.   dapagliflozin  propanediol (FARXIGA) 10 MG TABS tablet TAKE 1 TABLET (10 MG TOTAL) BY MOUTH DAILY BEFORE BREAKFAST.   ENTRESTO 49-51 MG Take 1 tablet by mouth 2 (two) times daily.   furosemide (LASIX) 80 MG tablet TAKE 1 TABLET (80 MG TOTAL) BY MOUTH DAILY.   metoprolol succinate (TOPROL-XL) 25 MG 24 hr tablet TAKE 1 TABLET (25 MG TOTAL) BY MOUTH DAILY.   Misc. Devices (GNP DIGITAL WEIGHT SCALE) MISC 1 Device by Does not apply route daily. Please provide patient with insurance approved scale ICD110 I50.2   nicotine (NICODERM CQ - DOSED IN MG/24 HR) 7 mg/24hr patch PLACE 1 PATCH (7 MG TOTAL) ONTO THE SKIN DAILY FOR 14 DAYS.   spironolactone (ALDACTONE) 25 MG tablet TAKE 1 TABLET (25 MG) BY ORAL ROUTE ONCE DAILY   [DISCONTINUED] furosemide (LASIX) 80 MG tablet Take 1 tablet (80 mg total) by mouth daily as needed. in the evening (in addition to 80 mg in the AM in prepackaged medication)     Allergies:   Codeine, Lisinopril, and Shellfish allergy   Social History   Socioeconomic History   Marital status: Single    Spouse name: Not on file   Number of children: Not on file   Years of education: Not on file   Highest education level: Not on file  Occupational History   Not on file  Tobacco Use   Smoking status: Some Days    Packs/day: 0.25    Types: Cigarettes    Last attempt to quit: 05/14/2020    Years since quitting: 1.0   Smokeless tobacco: Never   Tobacco comments:    1-2 cigarettes daily  Vaping Use   Vaping Use: Never used  Substance and Sexual Activity   Alcohol use: Yes    Alcohol/week: 2.0 standard drinks    Types: 2 Cans of beer per week    Comment: occasionally; once every 2 weeks   Drug use: No   Sexual activity: Not on file  Other Topics Concern   Not on file  Social History Narrative   Not on file   Social Determinants of Health   Financial Resource Strain: Not on file  Food Insecurity: Not on file  Transportation Needs: Not on file  Physical Activity: Not on file  Stress:  Not on file  Social Connections: Not on file     Family History: The patient's family history is negative for Colon cancer and Depression.  ROS:   Please see the history of present illness.     All other systems reviewed and are negative.  EKGs/Labs/Other Studies Reviewed:    The following studies were reviewed today:   EKG:  EKG is  ordered today.  The ekg ordered today demonstrates normal sinus rhythm, rate 79, nonspecific T wave flattening, QTC 483  Recent Labs: 08/15/2020: Magnesium 2.2 12/13/2020: B Natriuretic Peptide 39.6; Hemoglobin 15.2; Platelets 209 01/02/2021: ALT 55; BUN 30; Creatinine, Ser 1.38; Potassium 5.0; Sodium 140; TSH 0.963  Recent Lipid Panel  Component Value Date/Time   CHOL 183 06/19/2020 0120   TRIG 45 06/19/2020 0120   HDL 58 06/19/2020 0120   CHOLHDL 3.2 06/19/2020 0120   VLDL 9 06/19/2020 0120   LDLCALC 116 (H) 06/19/2020 0120    Physical Exam:    VS:  BP 121/79   Pulse 71   Ht '5\' 6"'$  (1.676 m)   Wt 247 lb 6.4 oz (112.2 kg)   SpO2 100%   BMI 39.93 kg/m     Wt Readings from Last 3 Encounters:  06/05/21 247 lb 6.4 oz (112.2 kg)  02/20/21 236 lb (107 kg)  01/02/21 227 lb 12.8 oz (103.3 kg)     GEN: Well nourished, well developed in no acute distress HEENT: Normal NECK: No JVD appreciated but difficult to assess given habitus; No carotid bruits CARDIAC: RRR, no murmurs, rubs, gallops RESPIRATORY:  Clear to auscultation without rales, wheezing or rhonchi  ABDOMEN: Soft, non-tender, non-distended MUSCULOSKELETAL:  No edema; No deformity  SKIN: Warm and dry NEUROLOGIC:  Alert and oriented x 3 PSYCHIATRIC:  Normal affect   ASSESSMENT:    1. Chronic combined systolic and diastolic heart failure (HCC)   2. Persistent atrial fibrillation ([place])   3. Essential hypertension   4. Tobacco use   5. Morbid obesity ([place])   6. Shortness of breath     PLAN:    Chronic Combined Systolic and Diastolic HF: admission [id number] for a/c systolic  heart failure>>Cardiogenic shock requiring milrinone, in setting of Afib w/ RVR.  EF 35-40% in 9/21 -> reduced down to 20% on repeat study 11/21.  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 Cath with normal cors. Echo today 12/05/20  EF 50-55% RV normal  - Continue amiodarone to maintain sinus rhythm - Continue lasix 80 mg po daily. Weight is up and reporting dyspnea.  Does not appear volume overloaded on exam but difficult to assess given habitus.  Will check chest x-ray, CMP/BNP/CBC/TSH.  Recommend monitoring daily weights.  Pending lab results plan to increase Lasix 80 mg twice daily x3 days then resume 80 mg daily and can take extra dose if gains more than 3 pounds in 1 day or 5 pounds in 1 week - Continue spiro 25 mg daily.  - Continue Entresto 49/51 BID - Continue Farxiga 10 mg daily   - Continue Toprol XL 25 mg daily.     Persistent Atrial Fibrillation s/p DC-CV 9/21. Had ERAF>>underwent repeat DCCV 11/21 back to [facility].  Has been seen in AF Clinic -> remains in [place] 12/21 with no AF - Continue amiodarone 200 mg once a day.  Check CMP, TSH - Continue Toprol XL 25 mg daily.  - Continue eliquis 5 mg bid. Denies abnormal bleeding.  -Starting CPAP for OSA   HTN: Continue Entresto, metoprolol, spironolactone as above   OSA: He has been started on CPAP  Tobacco use: down to 5 cigarettes per week.  Encouraged cessation  Morbid obesity: refer to [facility]  RTC in 6 weeks  Medication Adjustments/Labs and Tests Ordered: Current medicines are reviewed at length with the patient today.  Concerns regarding medicines are outlined above.  Orders Placed This Encounter  Procedures   DG Chest 2 View   Comprehensive metabolic panel   CBC   Brain natriuretic peptide   Magnesium   TSH   Ambulatory referral to [facility]    Meds ordered this encounter  Medications   DISCONTD: furosemide (LASIX) 80 MG tablet    Sig: Take 1  tablet (80 mg total) by mouth daily as needed. in the evening  (in addition to 80 mg in the AM in prepackaged medication)    Dispense:  30 tablet    Refill:  3   furosemide (LASIX) 80 MG tablet    Sig: Take 1 tablet (80 mg total) by mouth daily as needed. in the evening (in addition to 80 mg in the AM in prepackaged medication)    Dispense:  30 tablet    Refill:  3     Patient Instructions  Medication Instructions:  INCREASE furosemide (Lasix) to 80 mg two times daily for 3 days-START tomorrow  --take medication as usual in prepackaged meds, in the evening take furosemide (Lasix) 80 mg (sent to your pharmacy)  Weigh daily, write it down. If you gain 3 lbs overnight or 5 lbs in 1 week, take additional 80 mg furosemide (Lasix) in the evening for 3 days. If weight does not go back down, call the office.   *If you need a refill on your cardiac medications before your next appointment, please call your pharmacy*   Lab Work: CMET, CBC, BNP, TSH, Mag today  If you have labs (blood work) drawn today and your tests are completely normal, you will receive your results only by: [facility] (if you have MyChart) OR A paper copy in the mail If you have any lab test that is abnormal or we need to change your treatment, we will call you to review the results.   Testing/Procedures: A chest x-ray takes a picture of the organs and structures inside the chest, including the heart, lungs, and blood vessels. This test can show several things, including, whether the heart is enlarges; whether fluid is building up in the lungs; and whether pacemaker / defibrillator leads are still in place. GO TO [facility], NO APPOINTMENT NEEDED M-F 8-4  Follow-Up: At [facility], you and your health needs are our priority.  As part of our continuing mission to provide you with exceptional heart care, we have created designated Provider Care Teams.  These Care Teams include your primary Cardiologist (physician) and Advanced Practice Providers (APPs -  Physician  Assistants and Nurse Practitioners) who all work together to provide you with the care you need, when you need it.  We recommend signing up for the patient portal called "MyChart".  Sign up information is provided on this After Visit Summary.  MyChart is used to connect with patients for Virtual Visits (Telemedicine).  Patients are able to view lab/test results, encounter notes, upcoming appointments, etc.  Non-urgent messages can be sent to your provider as well.   To learn more about what you can do with MyChart, go to [email or web address].    Your next appointment:   6 week(s)  The format for your next appointment:   In Person  Provider:   Oswaldo Milian, MD   Other Instructions You have been referred to [facility]    Signed, Donato Heinz, MD  06/05/2021 8:53 AM    [place]

## 2021-06-05 ENCOUNTER — Encounter: Payer: Self-pay | Admitting: Cardiology

## 2021-06-05 ENCOUNTER — Other Ambulatory Visit: Payer: Self-pay

## 2021-06-05 ENCOUNTER — Ambulatory Visit
Admission: RE | Admit: 2021-06-05 | Discharge: 2021-06-05 | Disposition: A | Discharge status: Home / Self Care | Payer: Medicare Other | Source: Ambulatory Visit | Attending: Cardiology | Admitting: Cardiology

## 2021-06-05 ENCOUNTER — Ambulatory Visit (INDEPENDENT_AMBULATORY_CARE_PROVIDER_SITE_OTHER): Payer: Medicare Other | Admitting: Cardiology

## 2021-06-05 VITALS — BP 121/79 | HR 71 | Ht 66.0 in | Wt 247.4 lb

## 2021-06-05 DIAGNOSIS — I5042 Chronic combined systolic (congestive) and diastolic (congestive) heart failure: Secondary | ICD-10-CM

## 2021-06-05 DIAGNOSIS — I4819 Other persistent atrial fibrillation: Secondary | ICD-10-CM | POA: Diagnosis not present

## 2021-06-05 DIAGNOSIS — R0602 Shortness of breath: Secondary | ICD-10-CM

## 2021-06-05 DIAGNOSIS — I10 Essential (primary) hypertension: Secondary | ICD-10-CM | POA: Diagnosis not present

## 2021-06-05 DIAGNOSIS — Z720 Tobacco use: Secondary | ICD-10-CM

## 2021-06-05 MED ORDER — FUROSEMIDE 80 MG PO TABS
80.0000 mg | ORAL_TABLET | Freq: Every day | ORAL | 3 refills | Status: DC | PRN
  Filled 2021-06-05: qty 30, 30d supply, fill #0

## 2021-06-05 MED ORDER — FUROSEMIDE 80 MG PO TABS: 80.0000 mg | ORAL_TABLET | Freq: Every day | ORAL | 3 refills | Status: DC | PRN

## 2021-06-05 NOTE — Patient Instructions (Addendum)
Medication Instructions:  INCREASE furosemide (Lasix) to 80 mg two times daily for 3 days-START tomorrow  --take medication as usual in prepackaged meds, in the evening take furosemide (Lasix) 80 mg (sent to your pharmacy)  Weigh daily, write it down. If you gain 3 lbs overnight or 5 lbs in 1 week, take additional 80 mg furosemide (Lasix) in the evening for 3 days. If weight does not go back down, call the office.   *If you need a refill on your cardiac medications before your next appointment, please call your pharmacy*   Lab Work: CMET, CBC, BNP, TSH, Mag today  If you have labs (blood work) drawn today and your tests are completely normal, you will receive your results only by: [facility] (if you have MyChart) OR A paper copy in the mail If you have any lab test that is abnormal or we need to change your treatment, we will call you to review the results.   Testing/Procedures: A chest x-ray takes a picture of the organs and structures inside the chest, including the heart, lungs, and blood vessels. This test can show several things, including, whether the heart is enlarges; whether fluid is building up in the lungs; and whether pacemaker / defibrillator leads are still in place. GO TO [facility], NO APPOINTMENT NEEDED M-F 8-4  Follow-Up: At [facility], you and your health needs are our priority.  As part of our continuing mission to provide you with exceptional heart care, we have created designated Provider Care Teams.  These Care Teams include your primary Cardiologist (physician) and Advanced Practice Providers (APPs -  Physician Assistants and Nurse Practitioners) who all work together to provide you with the care you need, when you need it.  We recommend signing up for the patient portal called "MyChart".  Sign up information is provided on this After Visit Summary.  MyChart is used to connect with patients for Virtual Visits (Telemedicine).  Patients are able to view  lab/test results, encounter notes, upcoming appointments, etc.  Non-urgent messages can be sent to your provider as well.   To learn more about what you can do with MyChart, go to [email or web address].    Your next appointment:   6 week(s)  The format for your next appointment:   In Person  Provider:   Oswaldo Milian, MD   Other Instructions You have been referred to [facility]

## 2021-06-06 LAB — SPECIMEN STATUS REPORT

## 2021-06-10 LAB — CBC
Hematocrit: 52 % — ABNORMAL HIGH (ref 37.5–51.0)
Hemoglobin: 16.8 g/dL (ref 13.0–17.7)
MCH: 27.5 pg (ref 26.6–33.0)
MCHC: 32.3 g/dL (ref 31.5–35.7)
MCV: 85 fL (ref 79–97)
Platelets: 231 10*3/uL (ref 150–450)
RBC: 6.1 x10E6/uL — ABNORMAL HIGH (ref 4.14–5.80)
RDW: 14.8 % (ref 11.6–15.4)
WBC: 5.2 10*3/uL (ref 3.4–10.8)

## 2021-06-10 LAB — COMPREHENSIVE METABOLIC PANEL
ALT: 63 IU/L — ABNORMAL HIGH (ref 0–44)
AST: 36 IU/L (ref 0–40)
Albumin/Globulin Ratio: 1.5 (ref 1.2–2.2)
Albumin: 4.5 g/dL (ref 3.8–4.8)
Alkaline Phosphatase: 91 IU/L (ref 44–121)
BUN/Creatinine Ratio: 18 (ref 10–24)
BUN: 24 mg/dL (ref 8–27)
Bilirubin Total: 0.3 mg/dL (ref 0.0–1.2)
CO2: 22 mmol/L (ref 20–29)
Calcium: 9.4 mg/dL (ref 8.6–10.2)
Chloride: 104 mmol/L (ref 96–106)
Creatinine, Ser: 1.32 mg/dL — ABNORMAL HIGH (ref 0.76–1.27)
Globulin, Total: 3.1 g/dL (ref 1.5–4.5)
Glucose: 104 mg/dL — ABNORMAL HIGH (ref 65–99)
Potassium: 4.9 mmol/L (ref 3.5–5.2)
Sodium: 141 mmol/L (ref 134–144)
Total Protein: 7.6 g/dL (ref 6.0–8.5)
eGFR: 60 mL/min/{1.73_m2} (ref >59)

## 2021-06-10 LAB — BRAIN NATRIURETIC PEPTIDE: BNP: 9.9 pg/mL (ref 0.0–100.0)

## 2021-06-10 LAB — TSH: TSH: 0.676 u[IU]/mL (ref 0.450–4.500)

## 2021-06-10 LAB — MAGNESIUM: Magnesium: 2.4 mg/dL — ABNORMAL HIGH (ref 1.6–2.3)

## 2021-06-19 NOTE — Telephone Encounter (Signed)
Attempted to call [facility] back but there was no answer and VM not set up.  Called [facility] to check the status of the patient's CPAP that was ordered back in June. With the nationwide shortage they are still a few months behind. Spoke to [facility] and they are currently working on orders done in March. She stated that they are starting to get more machines in and is hoping that the patient will be able to get his machine in the next couple of months.

## 2021-07-20 ENCOUNTER — Other Ambulatory Visit: Payer: Self-pay

## 2021-07-22 NOTE — Progress Notes (Signed)
Cardiology Office Note:    Date:  07/23/2021   ID:  Christopher Mikes., DOB 1956/05/06, MRN [id number]  PCP:  [name], NP  Cardiologist:  Donato Heinz, MD  Electrophysiologist:  None   Referring MD: [name], NP   No chief complaint on file.   History of Present Illness:    Christopher Wilkins. is a 65 y.o. male with a hx of chronic combined systolic congestive heart failure, atrial fibrillation, tobacco use, hypertension who presents for follow-up.  He previously followed with Dr. Haroldine Laws in the advanced heart failure clinic.  He was admitted in September 2021 with acute combined heart failure and atrial fibrillation.  Echocardiogram showed LVEF 35 to 40%.  He underwent TEE/DCCV with restoration of sinus rhythm.  He was discharged on Eliquis, Entresto, metoprolol, and Lasix.  He was readmitted in November 2021 with acute heart failure and atrial fibrillation.  Echocardiogram showed EF was down to 20%.  He was found to be in cardiogenic shock with AKI, lactic acidosis, coox 44%.  He was started on milrinone and diuresed with IV Lasix.  RHC/LHC showed normal coronary arteries.  He was able to be weaned off milrinone and eventually underwent successful TEE/DCCV with restoration of sinus rhythm.  Hospital follow-up in December he was found to be back in atrial fibrillation.  Was referred to A. fib clinic and noted to be back in sinus rhythm.  Zio patch was placed, which showed no atrial fibrillation.  Echocardiogram on 12/05/2020 showed EF 50 to 55%, normal RV function, likely mild AS.  Since last clinic visit, he reports that he is doing well.  Denies any chest pain, dyspnea, lower extremity edema, or palpitations.  Reports some lightheadedness but denies any syncope.  Has not been using CPAP.  Smokes about 3 cigarettes/week.  Denies any bleeding issues on Eliquis.   Wt Readings from Last 3 Encounters:  07/23/21 251 lb 12.8 oz (114.2 kg)  06/05/21 247 lb 6.4 oz (112.2 kg)   02/20/21 236 lb (107 kg)     Past Medical History:  Diagnosis Date   Atrial fibrillation (HCC)    CHF (congestive heart failure) ([place])    Dyspnea    Hypertension    Insomnia    Prediabetes     Past Surgical History:  Procedure Laterality Date   CARDIOVERSION N/A 06/20/2020   Procedure: CARDIOVERSION;  Surgeon: Jerline Pain, MD;  Location: [place] ENDOSCOPY;  Service: Cardiovascular;  Laterality: N/A;   CARDIOVERSION N/A 08/20/2020   Procedure: CARDIOVERSION;  Surgeon: [name], MD;  Location: [facility];  Service: Cardiovascular;  Laterality: N/A;   CHOLECYSTECTOMY     OTHER SURGICAL HISTORY     "Shot a couple of times"   RIGHT/LEFT HEART CATH AND CORONARY ANGIOGRAPHY N/A 08/18/2020   Procedure: RIGHT/LEFT HEART CATH AND CORONARY ANGIOGRAPHY;  Surgeon: [name], MD;  Location: [place] CV LAB;  Service: Cardiovascular;  Laterality: N/A;   TEE WITHOUT CARDIOVERSION N/A 06/20/2020   Procedure: TRANSESOPHAGEAL ECHOCARDIOGRAM (TEE);  Surgeon: Jerline Pain, MD;  Location: [facility] END[facility]COPY;  Service: Cardiovascular;  Laterality: N/A;   TEE WITHOUT CARDIOVERSION N/A 08/20/2020   Procedure: TRANSESOPHAGEAL ECHOCARDIOGRAM (TEE);  Surgeon: [name], MD;  Location: [facility] ENDOSCOPY;  Service: Cardiovascular;  Laterality: N/A;    Current Medications: Current Meds  Medication Sig   amiodarone (PACERONE) 200 MG tablet TAKE 1 TABLET (200 MG) BY ORAL ROUTE ONCE DAILY   apixaban (ELIQUIS) 5 MG TABS tablet TAKE  1 TABLET (5 MG TOTAL) BY MOUTH 2 (TWO) TIMES DAILY.   atorvastatin (LIPITOR) 40 MG tablet TAKE 1 TABLET (40 MG) BY ORAL ROUTE ONCE DAILY AT BEDTIME   Blood Pressure Monitor DEVI Please provide patient with insurance approved blood pressure monitor I10.0   Cholecalciferol (VITAMIN D3 PO) Take by mouth daily.   dapagliflozin propanediol (FARXIGA) 10 MG TABS tablet TAKE 1 TABLET (10 MG TOTAL) BY MOUTH DAILY BEFORE BREAKFAST.   ENTRESTO 49-51 MG Take 1 tablet by  mouth 2 (two) times daily.   furosemide (LASIX) 80 MG tablet TAKE 1 TABLET (80 MG TOTAL) BY MOUTH DAILY.   metoprolol succinate (TOPROL-XL) 25 MG 24 hr tablet TAKE 1 TABLET (25 MG TOTAL) BY MOUTH DAILY.   spironolactone (ALDACTONE) 25 MG tablet TAKE 1 TABLET (25 MG) BY ORAL ROUTE ONCE DAILY     Allergies:   Codeine, Lisinopril, and Shellfish allergy   Social History   Socioeconomic History   Marital status: Single    Spouse name: Not on file   Number of children: Not on file   Years of education: Not on file   Highest education level: Not on file  Occupational History   Not on file  Tobacco Use   Smoking status: Some Days    Packs/day: 0.25    Types: Cigarettes    Last attempt to quit: 05/14/2020    Years since quitting: 1.1   Smokeless tobacco: Never   Tobacco comments:    1-2 cigarettes daily  Vaping Use   Vaping Use: Never used  Substance and Sexual Activity   Alcohol use: Yes    Alcohol/week: 2.0 standard drinks    Types: 2 Cans of beer per week    Comment: occasionally; once every 2 weeks   Drug use: No   Sexual activity: Not on file  Other Topics Concern   Not on file  Social History Narrative   Not on file   Social Determinants of Health   Financial Resource Strain: Not on file  Food Insecurity: Not on file  Transportation Needs: Not on file  Physical Activity: Not on file  Stress: Not on file  Social Connections: Not on file     Family History: The patient's family history is negative for Colon cancer and Depression.  ROS:   Please see the history of present illness.     All other systems reviewed and are negative.  EKGs/Labs/Other Studies Reviewed:    The following studies were reviewed today:   EKG:  EKG is  ordered today.  The ekg ordered today demonstrates normal sinus rhythm, rate 77, nonspecific T wave flattening, QTC 468   Recent Labs: 06/05/2021: ALT 63; BNP 9.9; BUN 24; Creatinine, Ser 1.32; Hemoglobin 16.8; Magnesium 2.4; Platelets  231; Potassium 4.9; Sodium 141; TSH 0.676  Recent Lipid Panel    Component Value Date/Time   CHOL 183 06/19/2020 0120   TRIG 45 06/19/2020 0120   HDL 58 06/19/2020 0120   CHOLHDL 3.2 06/19/2020 0120   VLDL 9 06/19/2020 0120   LDLCALC 116 (H) 06/19/2020 0120    Physical Exam:    VS:  BP 135/76   Pulse 77   Ht 5\' 7"  (1.702 m)   Wt 251 lb 12.8 oz (114.2 kg)   SpO2 97%   BMI 39.44 kg/m     Wt Readings from Last 3 Encounters:  07/23/21 251 lb 12.8 oz (114.2 kg)  06/05/21 247 lb 6.4 oz (112.2 kg)  02/20/21 236 lb (107 kg)  GEN: Well nourished, well developed in no acute distress HEENT: Normal NECK: No JVD appreciated but difficult to assess given habitus; No carotid bruits CARDIAC: RRR, no murmurs, rubs, gallops RESPIRATORY:  Clear to auscultation without rales, wheezing or rhonchi  ABDOMEN: Soft, non-tender, non-distended MUSCULOSKELETAL:  No edema; No deformity  SKIN: Warm and dry NEUROLOGIC:  Alert and oriented x 3 PSYCHIATRIC:  Normal affect   ASSESSMENT:    1. Chronic combined systolic and diastolic heart failure (HCC)   2. Persistent atrial fibrillation ([place])   3. Essential hypertension   4. OSA (obstructive sleep apnea)   5. Morbid obesity ([place])   6. Tobacco use      PLAN:    Chronic Combined Systolic and Diastolic HF: admission 06/23 for a/c systolic heart failure>>Cardiogenic shock requiring milrinone, in setting of Afib w/ RVR.  EF 35-40% in 9/21 -> reduced down to 20% on repeat study 11/21.  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 Cath with normal cors. Echo 12/05/20  EF 50-55% RV normal  - Continue amiodarone to maintain sinus rhythm - Continue lasix 80 mg po daily.  Appears euvolemic.  Will check BMP/magnesium - Continue spiro 25 mg daily.  - Continue Entresto 49/51 BID - Continue Farxiga 10 mg daily   - Continue Toprol XL 25 mg daily.     Persistent Atrial Fibrillation s/p DC-CV 9/21. Had ERAF>>underwent repeat DCCV 11/21 back to [facility].  Has been seen in AF  Clinic -> remains in [place] 12/21 with no AF - Continue amiodarone 200 mg once a day.  Normal LFTs, TSH, chest x-ray 06/05/2021 - Continue Toprol XL 25 mg daily.  - Continue eliquis 5 mg bid. Denies abnormal bleeding.  - Starting CPAP for OSA  HTN: Continue Entresto, metoprolol, spironolactone as above   OSA: He has been started on CPAP but reports has not been using.  Strongly encourage compliance with CPAP, as suspect untreated OSA led to A. fib which led to heart failure  Tobacco use: down to 3 cigarettes per week.  Encouraged cessation  Morbid obesity: referred to [facility]  RTC in 3 months  Medication Adjustments/Labs and Tests Ordered: Current medicines are reviewed at length with the patient today.  Concerns regarding medicines are outlined above.  Orders Placed This Encounter  Procedures   Basic metabolic panel   Magnesium   EKG 12-Lead     No orders of the defined types were placed in this encounter.    Patient Instructions  Medication Instructions:  Your physician recommends that you continue on your current medications as directed. Please refer to the Current Medication list given to you today.  *If you need a refill on your cardiac medications before your next appointment, please call your pharmacy*  Lab Work: BMET, [place] today  If you have labs (blood work) drawn today and your tests are completely normal, you will receive your results only by: [place] (if you have MyChart) OR A paper copy in the mail If you have any lab test that is abnormal or we need to change your treatment, we will call you to review the results.   Follow-Up: At [facility], you and your health needs are our priority.  As part of our continuing mission to provide you with exceptional heart care, we have created designated Provider Care Teams.  These Care Teams include your primary Cardiologist (physician) and Advanced Practice Providers (APPs -  Physician  Assistants and Nurse Practitioners) who all work together to provide you  with the care you need, when you need it.  We recommend signing up for the patient portal called "MyChart".  Sign up information is provided on this After Visit Summary.  MyChart is used to connect with patients for Virtual Visits (Telemedicine).  Patients are able to view lab/test results, encounter notes, upcoming appointments, etc.  Non-urgent messages can be sent to your provider as well.   To learn more about what you can do with MyChart, go to [email or web address].    Your next appointment:   3 month(s)  The format for your next appointment:   In Person  Provider:   You will see one of the following Advanced Practice Providers on your designated Care Team:   Rosaria Ferries, PA-C Caron Presume, PA-C Jory Sims, DNP, ANP  Then, Donato Heinz, MD will plan to see you again in 6 month(s).      Signed, Donato Heinz, MD  07/23/2021 8:59 AM    [facility]

## 2021-07-23 ENCOUNTER — Other Ambulatory Visit: Payer: Self-pay

## 2021-07-23 ENCOUNTER — Encounter: Payer: Self-pay | Admitting: Cardiology

## 2021-07-23 ENCOUNTER — Ambulatory Visit (INDEPENDENT_AMBULATORY_CARE_PROVIDER_SITE_OTHER): Payer: Medicare Other | Admitting: Cardiology

## 2021-07-23 VITALS — BP 135/76 | HR 77 | Ht 67.0 in | Wt 251.8 lb

## 2021-07-23 DIAGNOSIS — I10 Essential (primary) hypertension: Secondary | ICD-10-CM

## 2021-07-23 DIAGNOSIS — G4733 Obstructive sleep apnea (adult) (pediatric): Secondary | ICD-10-CM

## 2021-07-23 DIAGNOSIS — I5042 Chronic combined systolic (congestive) and diastolic (congestive) heart failure: Secondary | ICD-10-CM

## 2021-07-23 DIAGNOSIS — I4819 Other persistent atrial fibrillation: Secondary | ICD-10-CM

## 2021-07-23 DIAGNOSIS — Z720 Tobacco use: Secondary | ICD-10-CM

## 2021-07-23 LAB — BASIC METABOLIC PANEL
BUN/Creatinine Ratio: 19 (ref 10–24)
BUN: 25 mg/dL (ref 8–27)
CO2: 23 mmol/L (ref 20–29)
Calcium: 9.3 mg/dL (ref 8.6–10.2)
Chloride: 103 mmol/L (ref 96–106)
Creatinine, Ser: 1.35 mg/dL — ABNORMAL HIGH (ref 0.76–1.27)
Glucose: 114 mg/dL — ABNORMAL HIGH (ref 70–99)
Potassium: 4.9 mmol/L (ref 3.5–5.2)
Sodium: 140 mmol/L (ref 134–144)
eGFR: 58 mL/min/{1.73_m2} — ABNORMAL LOW (ref >59)

## 2021-07-23 LAB — MAGNESIUM: Magnesium: 2.4 mg/dL — ABNORMAL HIGH (ref 1.6–2.3)

## 2021-07-23 NOTE — Patient Instructions (Signed)
Medication Instructions:  Your physician recommends that you continue on your current medications as directed. Please refer to the Current Medication list given to you today.  *If you need a refill on your cardiac medications before your next appointment, please call your pharmacy*  Lab Work: BMET, [place] today  If you have labs (blood work) drawn today and your tests are completely normal, you will receive your results only by: [place] (if you have MyChart) OR A paper copy in the mail If you have any lab test that is abnormal or we need to change your treatment, we will call you to review the results.   Follow-Up: At [facility], you and your health needs are our priority.  As part of our continuing mission to provide you with exceptional heart care, we have created designated Provider Care Teams.  These Care Teams include your primary Cardiologist (physician) and Advanced Practice Providers (APPs -  Physician Assistants and Nurse Practitioners) who all work together to provide you with the care you need, when you need it.  We recommend signing up for the patient portal called "MyChart".  Sign up information is provided on this After Visit Summary.  MyChart is used to connect with patients for Virtual Visits (Telemedicine).  Patients are able to view lab/test results, encounter notes, upcoming appointments, etc.  Non-urgent messages can be sent to your provider as well.   To learn more about what you can do with MyChart, go to [email or web address].    Your next appointment:   3 month(s)  The format for your next appointment:   In Person  Provider:   You will see one of the following Advanced Practice Providers on your designated Care Team:   Rosaria Ferries, PA-C Caron Presume, PA-C Jory Sims, DNP, ANP  Then, Donato Heinz, MD will plan to see you again in 6 month(s).

## 2021-07-27 ENCOUNTER — Encounter: Payer: Self-pay | Admitting: *Deleted

## 2021-08-03 NOTE — Telephone Encounter (Signed)
Reached out to the patient and he states he has received his machine.

## 2021-08-30 IMAGING — CT CT CHEST W/O CM
2 of 4 series · 15 of 36 positions shown, 18 images · non-contrast
Comparison: No recent CT chest.

CLINICAL DATA: Chest pain. Shortness of breath and pleurisy.
Effusion suspected.

EXAM:
CT CHEST WITHOUT CONTRAST
TECHNIQUE: Multidetector CT imaging of the chest was performed following the
standard protocol without IV contrast.

[Series 4: thorax 2.0 · axial · 0.76mm/px · z∈[+1388,+1734]mm · 12 of 195 slices shown, 15 images]
[im 11/195  mediastinal]
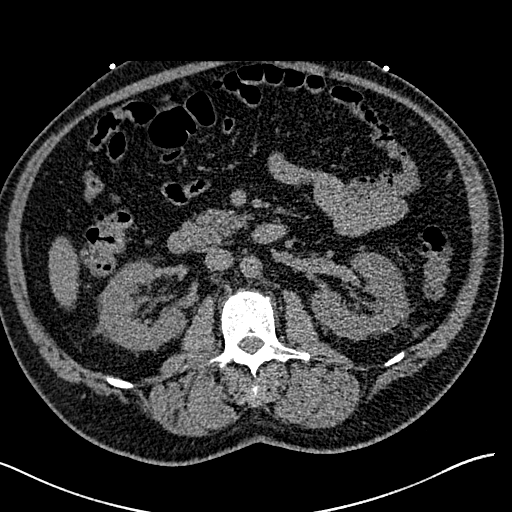
[im 11/195  lung]
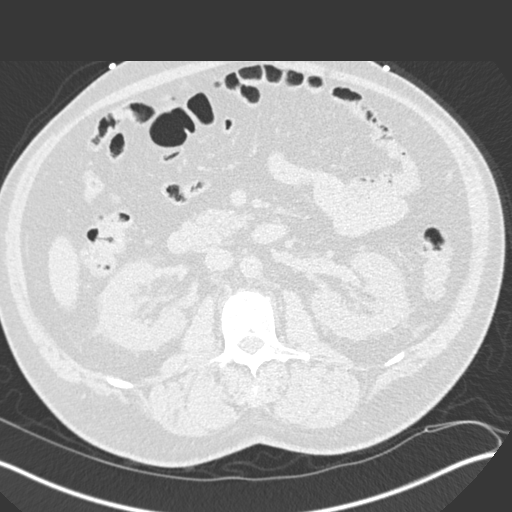
[im 31/195  lung]
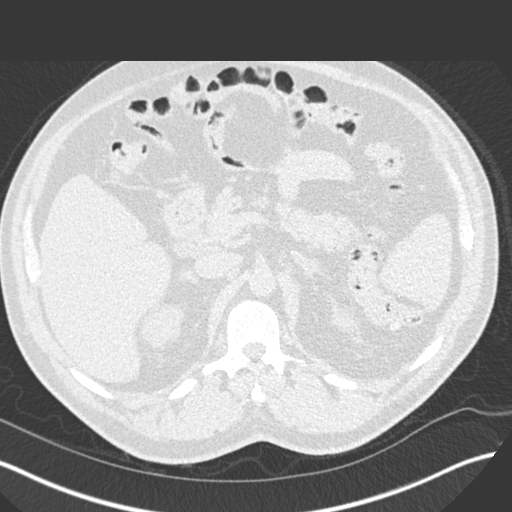
[im 41/195  lung]
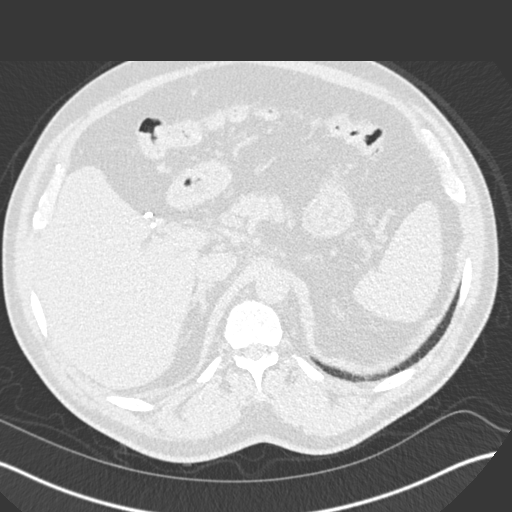
[im 62/195  lung]
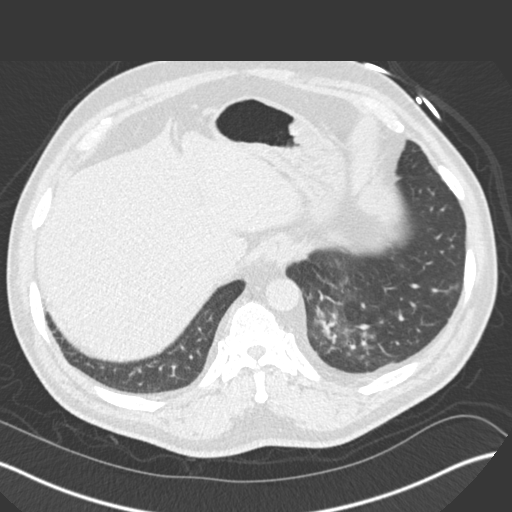
[im 72/195  mediastinal]
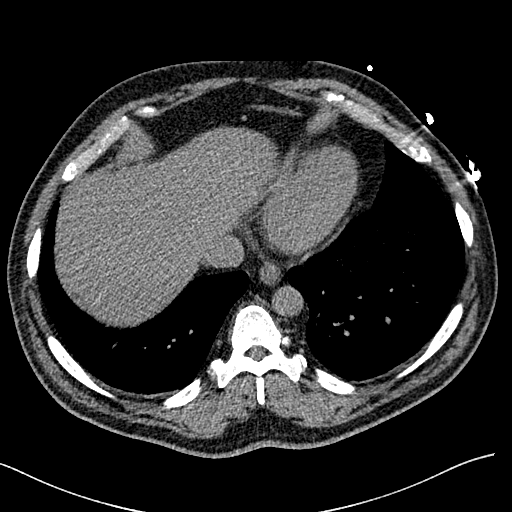
[im 72/195  lung]
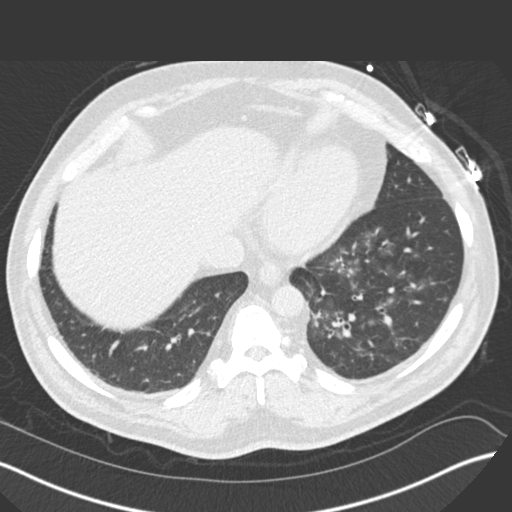
[im 92/195  lung]
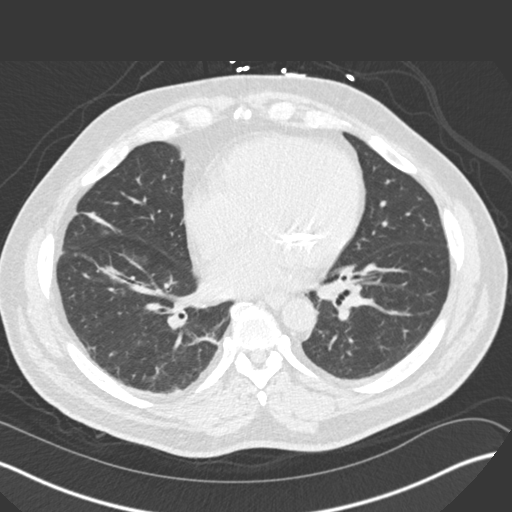
[im 103/195  lung]
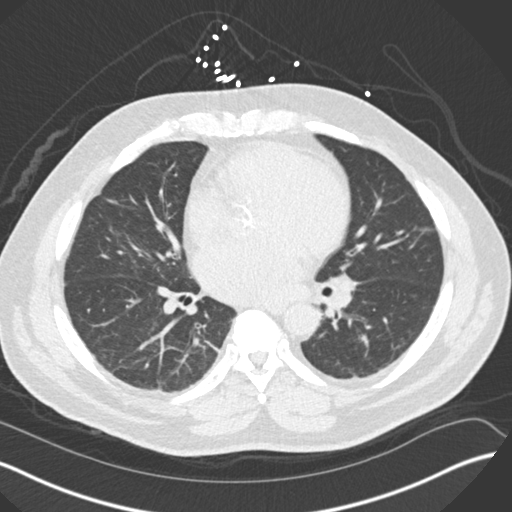
[im 123/195  lung]
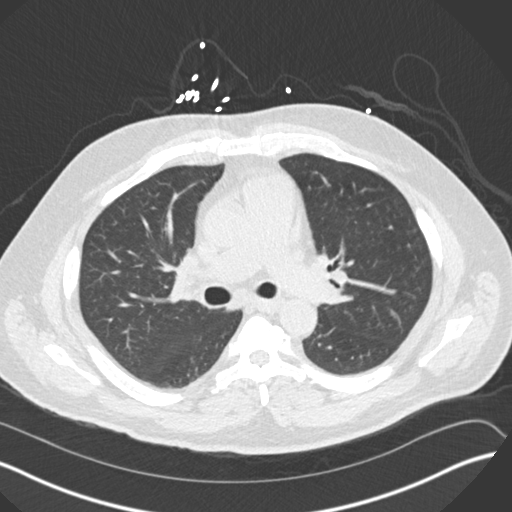
[im 133/195  mediastinal]
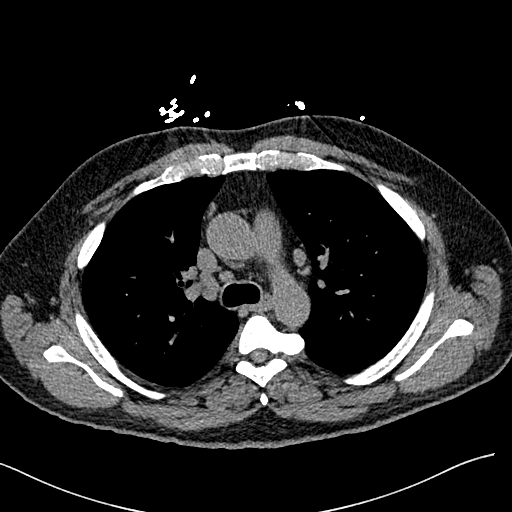
[im 133/195  lung]
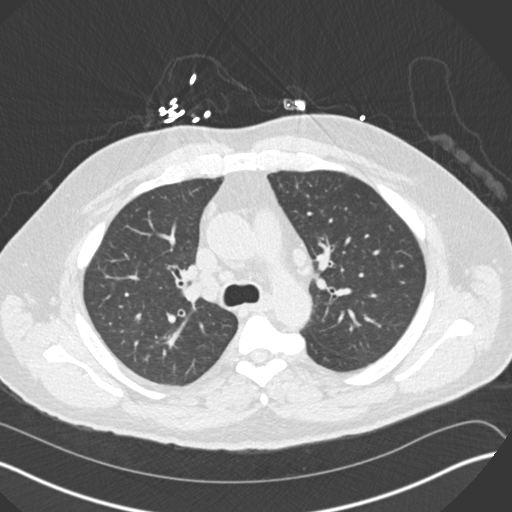
[im 154/195  lung]
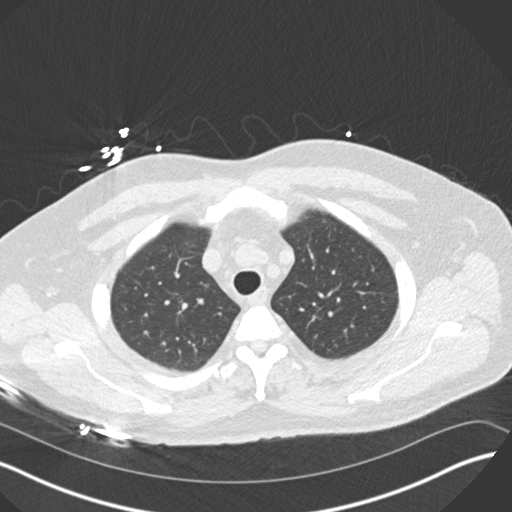
[im 164/195  lung]
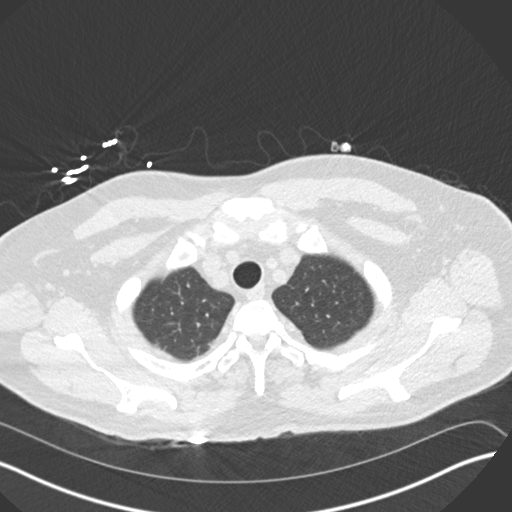
[im 184/195  lung]
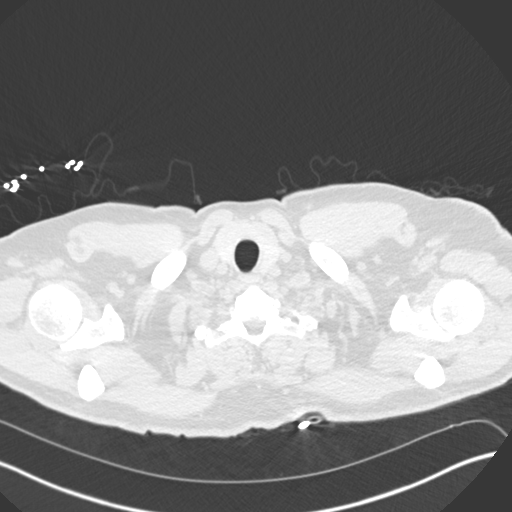

[Series 6: coronal · coronal · 0.80mm/px · 3 of 107 slices shown]
[im 22/107  lung]
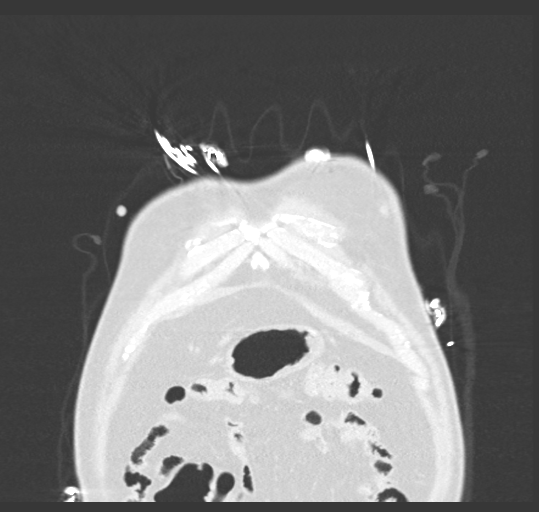
[im 43/107  lung]
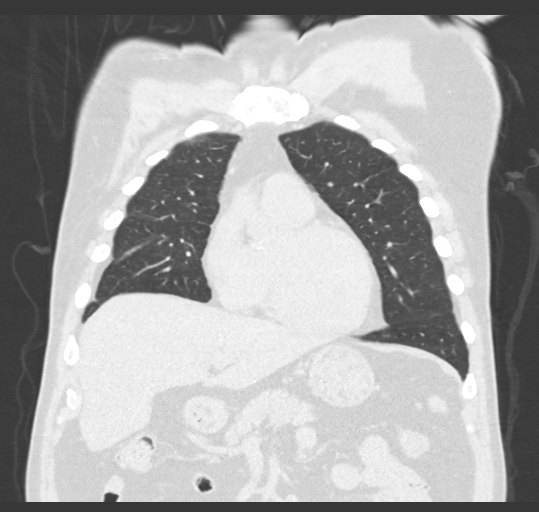
[im 64/107  lung]
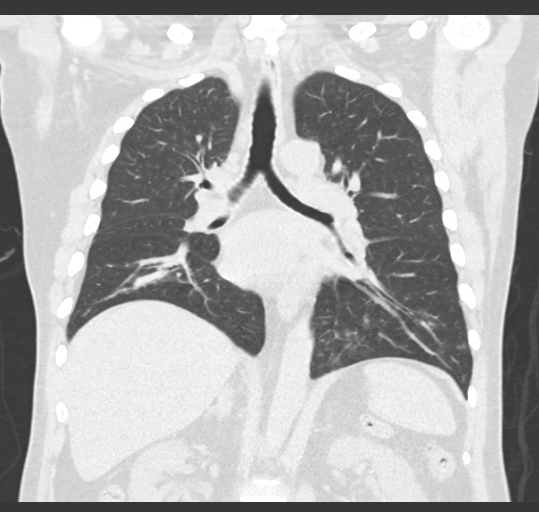

[15 of 36 positions shown; findings below may reference images not displayed]

FINDINGS: Cardiovascular: The heart size is unremarkable. There is no evidence
for thoracic aortic aneurysm. There are coronary artery
calcifications. There are mild atherosclerotic changes of the
thoracic aorta.

Mediastinum/Nodes:

-- No mediastinal lymphadenopathy.

-- No hilar lymphadenopathy.

-- No axillary lymphadenopathy.

-- No supraclavicular lymphadenopathy.

-- Normal thyroid gland where visualized.

-  Unremarkable esophagus.

Lungs/Pleura: There is atelectasis and scarring at the lung bases
bilaterally. There is bronchial wall thickening and multifocal areas
of consolidation involving the left lower lobe. There is no
pneumothorax. No large pleural effusion.

Upper Abdomen: There is no acute abnormality in the upper abdomen.
The patient is status post prior cholecystectomy.

Musculoskeletal: There is no acute displaced fracture. There is an
area of sclerosis involving the fifth rib anterior laterally on the
left. This likely explains the finding seen on the patient's prior
chest x-ray. No other significant sclerotic lesion was identified on
this study.
IMPRESSION: 1. There is bronchial wall thickening and scattered areas of
consolidation involving the left lower lobe suggestive of infectious
bronchiolitis.
2. There is an area of sclerosis involving the anterolateral fifth
rib on the left, which likely explains the finding on the patient's
recent chest x-ray. This is favored to represent a benign process
such as a healed fracture or bone island as opposed to a sclerotic
metastatic lesion in the absence of any known malignancy.
3. There are scattered areas of atelectasis and scarring
bilaterally.
4. Coronary artery disease is noted. Aortic Atherosclerosis
([id number]).

## 2021-08-30 IMAGING — DX DG SHOULDER 2+V*R*
3 series · 3 of 3 positions shown · non-contrast
Comparison: None.

CLINICAL DATA: Shortness of breath.  Right shoulder pain.  Fell.

EXAM:
RIGHT SHOULDER - 2+ VIEW

[shoulder grashey]
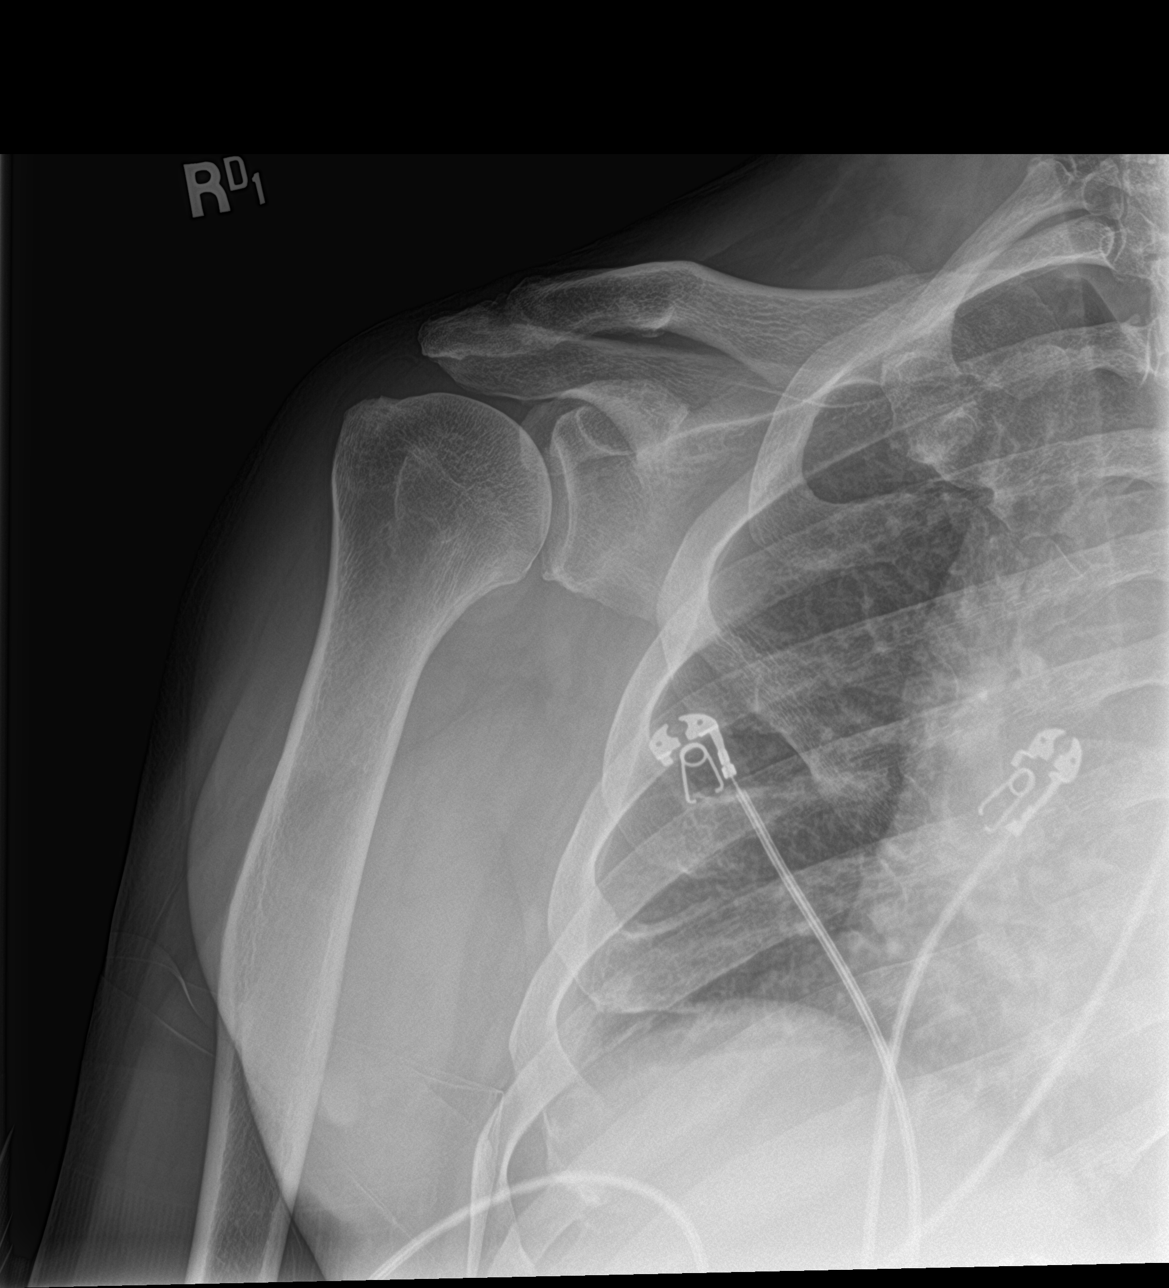

[shoulder y view]
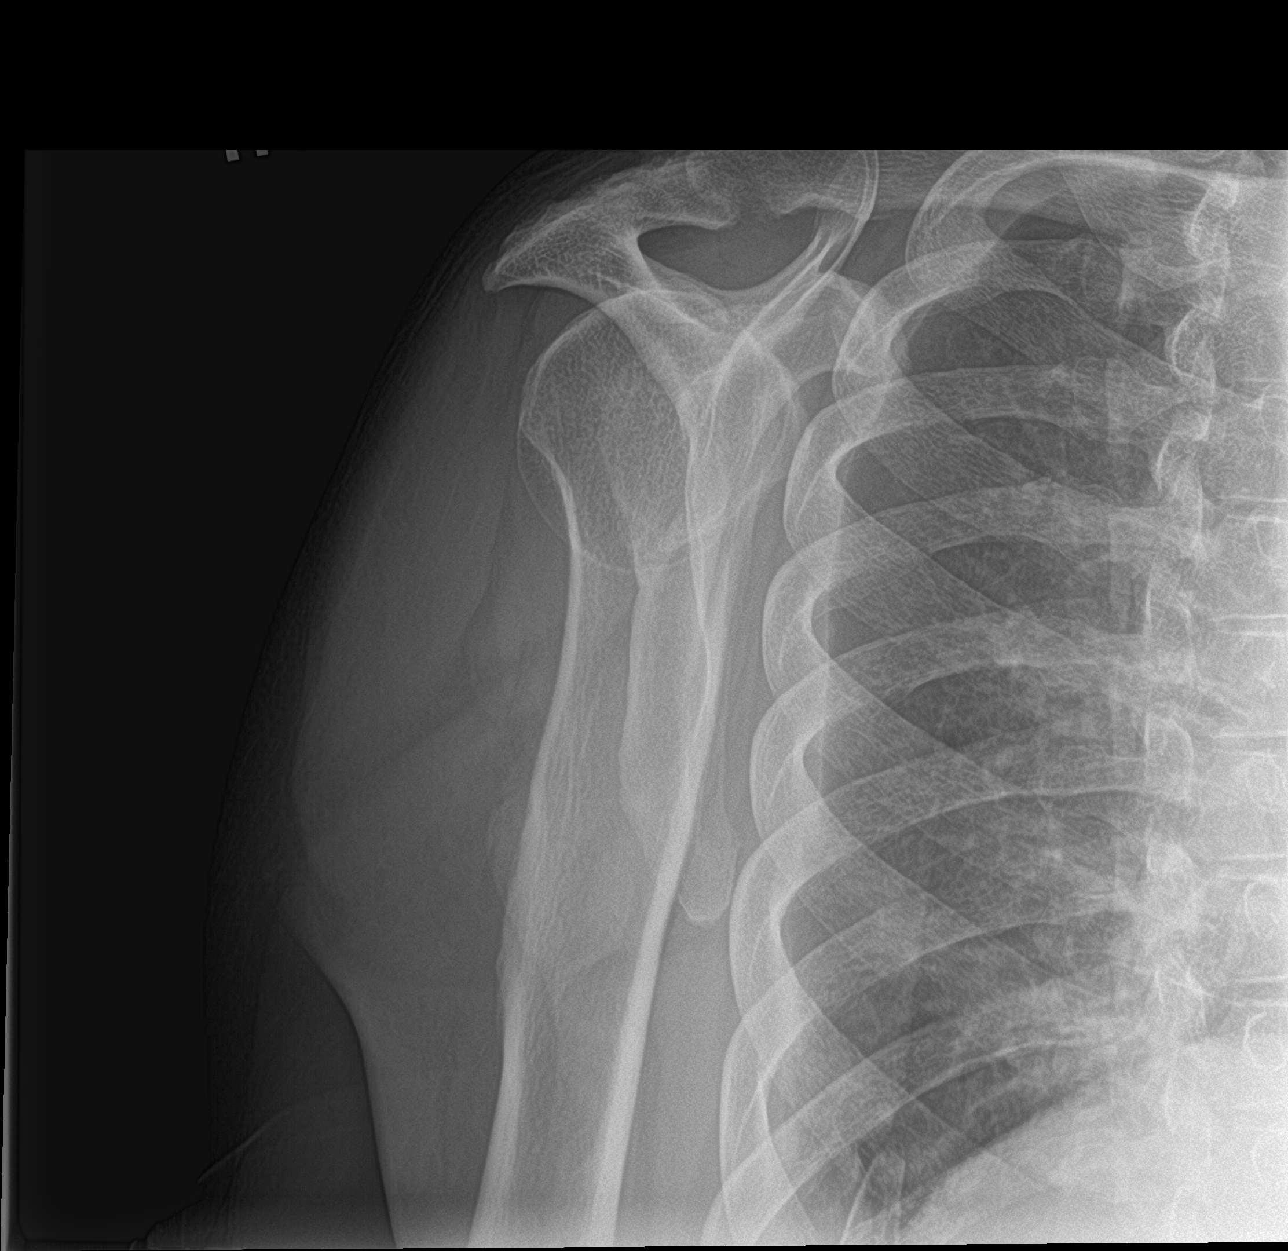

[shoulder axillary]
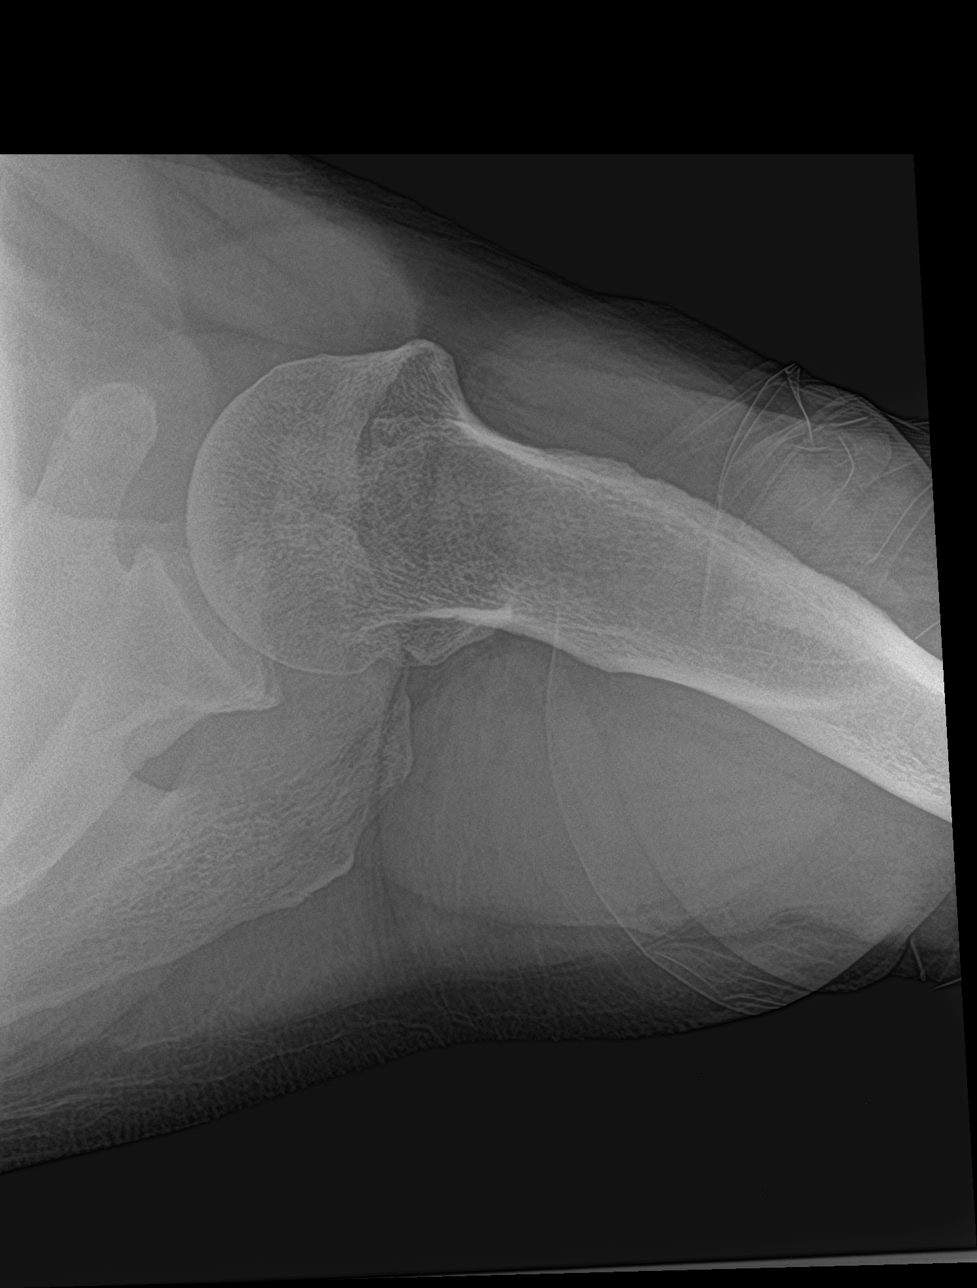

[3 of 3 positions shown; findings below may reference images not displayed]

FINDINGS: Mild AC joint degenerative changes. The glenohumeral joint is
maintained. No fracture or dislocation. No abnormal soft tissue
calcifications. The visualized right ribs are intact and the
visualized right lung is grossly clear.
IMPRESSION: No fracture or dislocation.

## 2021-08-31 ENCOUNTER — Ambulatory Visit (INDEPENDENT_AMBULATORY_CARE_PROVIDER_SITE_OTHER): Payer: Medicare Other | Admitting: Podiatry

## 2021-08-31 ENCOUNTER — Other Ambulatory Visit: Payer: Self-pay

## 2021-08-31 ENCOUNTER — Encounter: Payer: Self-pay | Admitting: Podiatry

## 2021-08-31 DIAGNOSIS — M79674 Pain in right toe(s): Secondary | ICD-10-CM

## 2021-08-31 DIAGNOSIS — I509 Heart failure, unspecified: Secondary | ICD-10-CM

## 2021-08-31 DIAGNOSIS — D689 Coagulation defect, unspecified: Secondary | ICD-10-CM | POA: Diagnosis not present

## 2021-08-31 DIAGNOSIS — M79675 Pain in left toe(s): Secondary | ICD-10-CM

## 2021-08-31 DIAGNOSIS — B351 Tinea unguium: Secondary | ICD-10-CM | POA: Diagnosis not present

## 2021-08-31 NOTE — Patient Instructions (Signed)
[facility] [address] [place] [phone]

## 2021-08-31 NOTE — Addendum Note (Signed)
Addended by: Cranford Mon R on: 08/31/2021 09:11 AM   Modules accepted: Orders

## 2021-08-31 NOTE — Progress Notes (Signed)
  Subjective:  Patient ID: Christopher Wilkins., male    DOB: 04-20-1956,   MRN: [id number]  Chief Complaint  Patient presents with   Nail Problem    I have some toenails that are thick and discolored and the big toe nails are touchy on the medial     65 y.o. male presents for thickened elongated and painful toenails that he cannot trim himself. Has a history of congestive heart failure and is currently on Eliquis. Relates he has a history of cutting his nails and bleeding and wants to have them professionally trimmed . Denies any other pedal complaints. Denies n/v/f/c.   Past Medical History:  Diagnosis Date   Atrial fibrillation (HCC)    CHF (congestive heart failure) ([place])    Dyspnea    Hypertension    Insomnia    Prediabetes     Objective:  Physical Exam: Vascular: DP/PT pulses 2/4 bilateral. CFT <3 seconds. Normal hair growth on digits. No edema.  Skin. No lacerations or abrasions bilateral feet. Nails 1-5 are thickened discolored and elongated with subungual debris.  Musculoskeletal: MMT 5/5 bilateral lower extremities in DF, PF, Inversion and Eversion. Deceased ROM in DF of ankle joint.  Neurological: Sensation intact to light touch.   Assessment:   1. Congestive heart failure, unspecified HF chronicity, unspecified heart failure type ([place])   2. Coagulation disorder ([place])   3. Onychomycosis   4. Pain due to onychomycosis of toenails of both feet      Plan:  Patient was evaluated and treated and all questions answered. -Discussed and educated patient on  foot care, especially with  regards to the vascular, neurological and musculoskeletal systems.  -Discussed supportive shoes at all times and checking feet regularly.  -Mechanically debrided all nails 1-5 bilateral using sterile nail nipper and filed with dremel without incident  -Answered all patient questions -Patient to return  in 3 months for at risk foot care -Patient advised to call the office if any problems or  questions arise in the meantime.   Lorenda Peck, DPM

## 2021-10-21 NOTE — Progress Notes (Signed)
Cardiology Clinic Note   Patient Name: Christopher Wilkins. Date of Encounter: 10/23/2021  Primary Care Provider:  [name], NP Primary Cardiologist:  Donato Heinz, MD  Patient Profile   66 year old male patient with history of chronic combined systolic and diastolic congestive heart failure, atrial fibrillation, ongoing tobacco abuse, OSA on CPAP, hypertension, also followed in the advanced heart failure clinic by Dr. Haroldine Laws.  He has HFrEF with an LVEF of 35 to 40%.  He also has paroxysmal atrial fibrillation and did undergo a TEE cardioversion with restoration to normal sinus rhythm in September 2021.  He had another admission in November 2021 with acute heart failure and atrial fibrillation with an EF of 20%, found to be in cardiogenic shock with acute kidney injury, lactic acidosis, Choloxin 44%.    He underwent another TEE cardioversion during that hospitalization.  He also underwent right heart cath and left heart cath which showed normal coronary arteries.  He was seen in A. fib clinic and was noted to be back in normal sinus rhythm, a Zio patch was placed which also confirmed no recurrence of atrial fibrillation.  Repeat echocardiogram on 12/05/2020 showed an EF of 50% to 55% with normal LV function and mild AS.  He was last seen in the office by Dr. Alda Lea on 07/23/2021 and and admitted noncompliance with CPAP.  He also stated he smoked about 3 cigarettes a week.   Past Medical History    Past Medical History:  Diagnosis Date   Atrial fibrillation (HCC)    CHF (congestive heart failure) ([place])    Dyspnea    Hypertension    Insomnia    Prediabetes    Past Surgical History:  Procedure Laterality Date   CARDIOVERSION N/A 06/20/2020   Procedure: CARDIOVERSION;  Surgeon: Jerline Pain, MD;  Location: [facility];  Service: Cardiovascular;  Laterality: N/A;   CARDIOVERSION N/A 08/20/2020   Procedure: CARDIOVERSION;  Surgeon: [name], MD;  Location: [facility];  Service: Cardiovascular;  Laterality: N/A;   CHOLECYSTECTOMY     OTHER SURGICAL HISTORY     "Shot a couple of times"   RIGHT/LEFT HEART CATH AND CORONARY ANGIOGRAPHY N/A 08/18/2020   Procedure: RIGHT/LEFT HEART CATH AND CORONARY ANGIOGRAPHY;  Surgeon: [name], MD;  Location: [facility];  Service: Cardiovascular;  Laterality: N/A;   TEE WITHOUT CARDIOVERSION N/A 06/20/2020   Procedure: TRANSESOPHAGEAL ECHOCARDIOGRAM (TEE);  Surgeon: Jerline Pain, MD;  Location: [facility];  Service: Cardiovascular;  Laterality: N/A;   TEE WITHOUT CARDIOVERSION N/A 08/20/2020   Procedure: TRANSESOPHAGEAL ECHOCARDIOGRAM (TEE);  Surgeon: [name], MD;  Location: [facility] ENDOSCOPY;  Service: Cardiovascular;  Laterality: N/A;    Allergies  Allergies  Allergen Reactions   Codeine Nausea And Vomiting   Lisinopril Other (See Comments)    "Didn't do well in my body"   Shellfish Allergy Other (See Comments)    Can tolerate shrimp in 2021 (??)    History of Present Illness    Christopher Wilkins is a 66 year old male who we are seeing for ongoing assessment and management of chronic combined systolic heart failure with most recent echocardiogram revealing restoration of EF to 50 to 55% on 12/05/2020, and also history of atrial fibrillation on amiodarone.  He remains on Eliquis, metoprolol 25 mg daily, he was also continued on Farxiga 10 mg daily Entresto 49-51 twice daily, and spironolactone 25 mg daily.  He was encouraged to be compliant with CPAP and to quit smoking.  His weight at that time was 251 pounds.  Home Medications    Current Outpatient Medications  Medication Sig Dispense Refill   albuterol (VENTOLIN HFA) 108 (90 Base) MCG/ACT inhaler SMARTSIG:1 Puff(s) Via Inhaler Every 4 Hours PRN     amiodarone (PACERONE) 200 MG tablet TAKE 1 TABLET (200 MG) BY ORAL ROUTE ONCE DAILY 90 tablet 3   apixaban (ELIQUIS) 5 MG TABS tablet TAKE 1 TABLET (5 MG TOTAL) BY MOUTH 2 (TWO) TIMES  DAILY. 60 tablet 0   atorvastatin (LIPITOR) 40 MG tablet TAKE 1 TABLET (40 MG) BY ORAL ROUTE ONCE DAILY AT BEDTIME 90 tablet 3   Blood Pressure Monitor DEVI Please provide patient with insurance approved blood pressure monitor I10.0 1 each 0   Cholecalciferol (VITAMIN D3 PO) Take by mouth daily.     dapagliflozin propanediol (FARXIGA) 10 MG TABS tablet TAKE 1 TABLET (10 MG TOTAL) BY MOUTH DAILY BEFORE BREAKFAST. 30 tablet 0   ENTRESTO 49-51 MG Take 1 tablet by mouth 2 (two) times daily.     furosemide (LASIX) 80 MG tablet Take 1 tablet (80 mg total) by mouth daily as needed. in the evening (in addition to 80 mg in the AM in prepackaged medication) 30 tablet 3   metoprolol succinate (TOPROL-XL) 25 MG 24 hr tablet TAKE 1 TABLET (25 MG TOTAL) BY MOUTH DAILY. 30 tablet 0   Misc. Devices (GNP DIGITAL WEIGHT SCALE) MISC 1 Device by Does not apply route daily. Please provide patient with insurance approved scale ICD110 I50.2 1 each 0   nicotine (NICODERM CQ - DOSED IN MG/24 HR) 7 mg/24hr patch PLACE 1 PATCH (7 MG TOTAL) ONTO THE SKIN DAILY FOR 14 DAYS. 14 patch 0   spironolactone (ALDACTONE) 25 MG tablet TAKE 1 TABLET (25 MG) BY ORAL ROUTE ONCE DAILY 90 tablet 3   No current facility-administered medications for this visit.     Family History    Family History  Problem Relation Age of Onset   Colon cancer Neg Hx    Depression Neg Hx    He indicated that his mother is deceased. He indicated that his father is deceased. He indicated that the status of his neg hx is unknown.  Social History    Social History   Socioeconomic History   Marital status: Single    Spouse name: Not on file   Number of children: Not on file   Years of education: Not on file   Highest education level: Not on file  Occupational History   Not on file  Tobacco Use   Smoking status: Some Days    Packs/day: 0.25    Types: Cigarettes    Last attempt to quit: 05/14/2020    Years since quitting: 1.4   Smokeless  tobacco: Never   Tobacco comments:    1-2 cigarettes daily  Vaping Use   Vaping Use: Never used  Substance and Sexual Activity   Alcohol use: Yes    Alcohol/week: 2.0 standard drinks    Types: 2 Cans of beer per week    Comment: occasionally; once every 2 weeks   Drug use: No   Sexual activity: Not on file  Other Topics Concern   Not on file  Social History Narrative   Not on file   Social Determinants of Health   Financial Resource Strain: Not on file  Food Insecurity: Not on file  Transportation Needs: Not on file  Physical Activity: Not on file  Stress: Not on file  Social  Connections: Not on file  Intimate Partner Violence: Not on file     Review of Systems    General:  No chills, fever, night sweats or weight changes.  Cardiovascular:  No chest pain, dyspnea on exertion, edema, orthopnea, palpitations, paroxysmal nocturnal dyspnea. Dermatological: No rash, lesions/masses Respiratory: No cough, dyspnea Urologic: No hematuria, dysuria Abdominal:   No nausea, vomiting, diarrhea, bright red blood per rectum, melena, or hematemesis Neurologic:  No visual changes, wkns, changes in mental status. All other systems reviewed and are otherwise negative except as noted above.     Physical Exam    VS:  BP 102/64 (BP Location: Right Arm, Patient Position: Sitting, Cuff Size: Large)    Pulse 78    Ht _0  (1.702 m)    BMI 39.44 kg/m  , BMI Body mass index is 39.44 kg/m.     GEN: Well nourished, well developed, in no acute distress. HEENT: normal. Neck: Supple, no JVD, carotid bruits, or masses. Cardiac: RRR, no murmurs, rubs, or gallops.Occasional extra systole.  No clubbing, cyanosis, edema.  Radials/DP/PT 2+ and equal bilaterally.  Respiratory:  Respirations regular and unlabored, clear to auscultation bilaterally, some crackles in the bases without wheezes.  GI: Soft, nontender, nondistended, BS + x 4. MS: no deformity or atrophy. Skin: warm and dry, no rash. Neuro:   Strength and sensation are intact. Psych: Normal affect.  Accessory Clinical Findings    ECG personally reviewed by me today- Not completed  Lab Results  Component Value Date   WBC 5.2 06/05/2021   HGB 16.8 06/05/2021   HCT 52.0 (H) 06/05/2021   MCV 85 06/05/2021   PLT 231 06/05/2021   Lab Results  Component Value Date   CREATININE 1.35 (H) 07/23/2021   BUN 25 07/23/2021   NA 140 07/23/2021   K 4.9 07/23/2021   CL 103 07/23/2021   CO2 23 07/23/2021   Lab Results  Component Value Date   ALT 63 (H) 06/05/2021   AST 36 06/05/2021   ALKPHOS 91 06/05/2021   BILITOT 0.3 06/05/2021   Lab Results  Component Value Date   CHOL 183 06/19/2020   HDL 58 06/19/2020   LDLCALC 116 (H) 06/19/2020   TRIG 45 06/19/2020   CHOLHDL 3.2 06/19/2020    Lab Results  Component Value Date   HGBA1C 5.9 (H) 08/17/2020    Review of Prior Studies: Echocardiogram 12/05/2020 1. E/E' may not be accurate due to MAC 17510. Left ventricular ejection  fraction, by estimation, is 50 to 55%. The left ventricle has low normal  function. The left ventricle has no regional wall motion abnormalities.  The left ventricular internal cavity   size was mildly dilated. There is mild left ventricular hypertrophy. Left  ventricular diastolic parameters are consistent with Grade I diastolic  dysfunction (impaired relaxation).   2. Right ventricular systolic function is normal. The right ventricular  size is normal.   3. Degenerative with calcified subchordal structures . The mitral valve  is abnormal. Trivial mitral valve regurgitation. No evidence of mitral  stenosis. Moderate mitral annular calcification.   4. Likely very mild AS peak velocity 29msec gardients / AVA not done .  The aortic valve is calcified. There is moderate calcification of the  aortic valve. Aortic valve regurgitation is not visualized. Mild to  moderate aortic valve  sclerosis/calcification is present, without any evidence of aortic   stenosis.   5. The inferior vena cava is normal in size with greater than 50%  respiratory  variability, suggesting right atrial pressure of 3 mmHg.   Cardiac Cath 08/18/20 Assessment: 1. Normal coronary arteries 2. Severe NICM EF 15-20% (suspect AF related) 3. Well compensated hemodynamics on milrinone 4. 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 Switch to apixaban later today. TEE/DC-CV on Wednesday am.   Assessment & Plan   1.  Chronic combined systolic and diastolic CHF: Most recent echocardiogram on 12/05/2020 revealed normalization of ejection fraction to 50 to 55% with mild AS.  The patient has gained some weight due to lack of exercise.  He used to be very active doing karate, running, and going to the gym.  Since having had his cardiomyopathy diagnoses he has cut back significantly.  He has gained approximately 20 pounds and wants to get it off as soon as possible so that he can be more active.  He reports that he is still getting good results from his diuretic.  He has recently joined [employer] with his sister and will begin to reengage in exercise to increase his stamina and lose some weight.  He continues to eat some salty foods, i.e. canned salmon or vegetables.  We have given him a "salty 6" educational flyer to assist him in avoiding salted foods.  When he cooks he does not add any additional salt but I have advised him about "hidden salt" that is found in some canned goods and other processed foods.  He verbalized understanding.  Continue Entresto 49/51, furosemide 80 mg in the a.m., with an additional 80 mg in the p.m. as needed.  Spironolactone 25 mg, and Farxiga 10 mg daily.  He has had recent labs at [facility] and we will request copies of those results.  2.  Paroxysmal atrial fibrillation: He is status post TEE cardioversion with restoration of normal sinus rhythm in 2021, he continues on Eliquis 5 mg twice daily and amiodarone 200 mg daily.   May need to consider stopping Eliquis as he remains in normal sinus rhythm.  This can be determined on follow-up with EKG.  3.  OSA: He is now compliant with CPAP in fact he has checked in with the company due to mouth dryness and throat dryness and has been given instructions about adjusting the temperature.  This is more comfortable for him and he is using it every day.  4.  Hypercholesterolemia: He remains on atorvastatin 40 mg daily at bedtime.  Recent labs have been drawn at [facility], and we will request those to review.  5.  Hypertension: Blood pressure is low normal.  Will not make any adjustments on his medications at this time.  If he becomes symptomatic with dizziness or near syncope we may need to adjust the doses of Lasix.  Current medicines are reviewed at length with the patient today.  I have spent 25 min's  dedicated to the care of this patient on the date of this encounter to include pre-visit review of records, assessment, management and diagnostic testing,with shared decision making.  Signed, Phill Myron. West Pugh, ANP, AACC   10/23/2021 3:18 PM    [facility] [place] [address] Office [phone] Fax [phone]  Notice: This dictation was prepared with Dragon dictation along with smaller phrase technology. Any transcriptional errors that result from this process are unintentional and may not be corrected upon review.

## 2021-10-23 ENCOUNTER — Ambulatory Visit (INDEPENDENT_AMBULATORY_CARE_PROVIDER_SITE_OTHER): Payer: Commercial Managed Care - HMO | Admitting: Adult Health

## 2021-10-23 ENCOUNTER — Encounter: Payer: Self-pay | Admitting: Adult Health

## 2021-10-23 ENCOUNTER — Other Ambulatory Visit: Payer: Self-pay

## 2021-10-23 VITALS — BP 102/64 | HR 78 | Ht 67.0 in | Wt 250.0 lb

## 2021-10-23 DIAGNOSIS — G4733 Obstructive sleep apnea (adult) (pediatric): Secondary | ICD-10-CM

## 2021-10-23 DIAGNOSIS — E7800 Pure hypercholesterolemia, unspecified: Secondary | ICD-10-CM | POA: Diagnosis not present

## 2021-10-23 DIAGNOSIS — I10 Essential (primary) hypertension: Secondary | ICD-10-CM

## 2021-10-23 DIAGNOSIS — I480 Paroxysmal atrial fibrillation: Secondary | ICD-10-CM

## 2021-10-23 DIAGNOSIS — I5042 Chronic combined systolic (congestive) and diastolic (congestive) heart failure: Secondary | ICD-10-CM | POA: Diagnosis not present

## 2021-10-23 NOTE — Patient Instructions (Signed)
Medication Instructions:  No Changes *If you need a refill on your cardiac medications before your next appointment, please call your pharmacy*   Lab Work: No Labs If you have labs (blood work) drawn today and your tests are completely normal, you will receive your results only by: [place] (if you have MyChart) OR A paper copy in the mail If you have any lab test that is abnormal or we need to change your treatment, we will call you to review the results.   Testing/Procedures: No Testing   Follow-Up: At [facility], you and your health needs are our priority.  As part of our continuing mission to provide you with exceptional heart care, we have created designated Provider Care Teams.  These Care Teams include your primary Cardiologist (physician) and Advanced Practice Providers (APPs -  Physician Assistants and Nurse Practitioners) who all work together to provide you with the care you need, when you need it.  We recommend signing up for the patient portal called "MyChart".  Sign up information is provided on this After Visit Summary.  MyChart is used to connect with patients for Virtual Visits (Telemedicine).  Patients are able to view lab/test results, encounter notes, upcoming appointments, etc.  Non-urgent messages can be sent to your provider as well.   To learn more about what you can do with MyChart, go to [email or web address].    Your next appointment:   3 month(s)  The format for your next appointment:   In Person  Provider:   Donato Heinz, MD

## 2021-12-01 ENCOUNTER — Ambulatory Visit: Payer: Medicare Other | Admitting: Podiatry

## 2021-12-31 ENCOUNTER — Ambulatory Visit (INDEPENDENT_AMBULATORY_CARE_PROVIDER_SITE_OTHER): Payer: Medicare Other | Admitting: Bariatrics

## 2021-12-31 ENCOUNTER — Encounter (INDEPENDENT_AMBULATORY_CARE_PROVIDER_SITE_OTHER): Payer: Self-pay | Admitting: Bariatrics

## 2021-12-31 ENCOUNTER — Other Ambulatory Visit: Payer: Self-pay

## 2021-12-31 VITALS — BP 126/80 | HR 80 | Temp 97.8°F | Ht 68.0 in | Wt 253.0 lb

## 2021-12-31 DIAGNOSIS — E7849 Other hyperlipidemia: Secondary | ICD-10-CM

## 2021-12-31 DIAGNOSIS — I509 Heart failure, unspecified: Secondary | ICD-10-CM

## 2021-12-31 DIAGNOSIS — Z1331 Encounter for screening for depression: Secondary | ICD-10-CM

## 2021-12-31 DIAGNOSIS — Z6838 Body mass index (BMI) 38.0-38.9, adult: Secondary | ICD-10-CM

## 2021-12-31 DIAGNOSIS — I10 Essential (primary) hypertension: Secondary | ICD-10-CM

## 2021-12-31 DIAGNOSIS — I4891 Unspecified atrial fibrillation: Secondary | ICD-10-CM

## 2021-12-31 DIAGNOSIS — R7303 Prediabetes: Secondary | ICD-10-CM

## 2021-12-31 DIAGNOSIS — G4730 Sleep apnea, unspecified: Secondary | ICD-10-CM

## 2021-12-31 DIAGNOSIS — E668 Other obesity: Secondary | ICD-10-CM

## 2021-12-31 DIAGNOSIS — R5383 Other fatigue: Secondary | ICD-10-CM | POA: Diagnosis not present

## 2021-12-31 DIAGNOSIS — R0602 Shortness of breath: Secondary | ICD-10-CM | POA: Diagnosis not present

## 2021-12-31 DIAGNOSIS — E559 Vitamin D deficiency, unspecified: Secondary | ICD-10-CM

## 2021-12-31 DIAGNOSIS — E6609 Other obesity due to excess calories: Secondary | ICD-10-CM

## 2022-01-01 LAB — COMPREHENSIVE METABOLIC PANEL
ALT: 118 IU/L — ABNORMAL HIGH (ref 0–44)
AST: 68 IU/L — ABNORMAL HIGH (ref 0–40)
Albumin/Globulin Ratio: 1.2 (ref 1.2–2.2)
Albumin: 4.2 g/dL (ref 3.8–4.8)
Alkaline Phosphatase: 103 IU/L (ref 44–121)
BUN/Creatinine Ratio: 14 (ref 10–24)
BUN: 20 mg/dL (ref 8–27)
Bilirubin Total: 0.3 mg/dL (ref 0.0–1.2)
CO2: 23 mmol/L (ref 20–29)
Calcium: 9.4 mg/dL (ref 8.6–10.2)
Chloride: 104 mmol/L (ref 96–106)
Creatinine, Ser: 1.39 mg/dL — ABNORMAL HIGH (ref 0.76–1.27)
Globulin, Total: 3.4 g/dL (ref 1.5–4.5)
Glucose: 105 mg/dL — ABNORMAL HIGH (ref 70–99)
Potassium: 4.6 mmol/L (ref 3.5–5.2)
Sodium: 142 mmol/L (ref 134–144)
Total Protein: 7.6 g/dL (ref 6.0–8.5)
eGFR: 56 mL/min/{1.73_m2} — ABNORMAL LOW (ref >59)

## 2022-01-01 LAB — LIPID PANEL WITH LDL/HDL RATIO
Cholesterol, Total: 186 mg/dL (ref 100–199)
HDL: 46 mg/dL (ref >39)
LDL Chol Calc (NIH): 113 mg/dL — ABNORMAL HIGH (ref 0–99)
LDL/HDL Ratio: 2.5 ratio (ref 0.0–3.6)
Triglycerides: 155 mg/dL — ABNORMAL HIGH (ref 0–149)
VLDL Cholesterol Cal: 27 mg/dL (ref 5–40)

## 2022-01-01 LAB — HEMOGLOBIN A1C
Est. average glucose Bld gHb Est-mCnc: 140 mg/dL
Hgb A1c MFr Bld: 6.5 % — ABNORMAL HIGH (ref 4.8–5.6)

## 2022-01-01 LAB — TSH+T4F+T3FREE
Free T4: 1.48 ng/dL (ref 0.82–1.77)
T3, Free: 2.5 pg/mL (ref 2.0–4.4)
TSH: 0.917 u[IU]/mL (ref 0.450–4.500)

## 2022-01-01 LAB — VITAMIN D 25 HYDROXY (VIT D DEFICIENCY, FRACTURES): Vit D, 25-Hydroxy: 27.3 ng/mL — ABNORMAL LOW (ref 30.0–100.0)

## 2022-01-01 LAB — INSULIN, RANDOM: INSULIN: 71.9 u[IU]/mL — ABNORMAL HIGH (ref 2.6–24.9)

## 2022-01-03 NOTE — Progress Notes (Signed)
? ? ? ?Chief Complaint:  ? ?OBESITY ?Colleen Donahoe (MR# [id number]) is a 66 y.o. male who presents for evaluation and treatment of obesity and related comorbidities. Current BMI is Body mass index is 38.47 kg/m?Marland Kitchen Kwinton has been struggling with his weight for many years and has been unsuccessful in either losing weight, maintaining weight loss, or reaching his healthy weight goal. ? ?Qasim he states he does like to cook. He craves fruits. ? ?Miking is currently in the action stage of change and ready to dedicate time achieving and maintaining a healthier weight. Loxley is interested in becoming our patient and working on intensive lifestyle modifications including (but not limited to) diet and exercise for weight loss. ? ?Moe's habits were reviewed today and are as follows: His family eats meals together, he thinks his family will eat healthier with him, his desired weight loss is 23 pounds, he has significant food cravings issues, he snacks frequently in the evenings, and he frequently eats larger portions than normal. ? ?Depression Screen ?Timarion's Food and Mood (modified PHQ-9) score was 8. ? ? ?  12/31/2021  ?  7:06 AM  ?Depression screen PHQ 2/9  ?Decreased Interest 3  ?Down, Depressed, Hopeless 0  ?PHQ - 2 Score 3  ?Altered sleeping 2  ?Tired, decreased energy 1  ?Change in appetite 0  ?Feeling bad or failure about yourself  0  ?Trouble concentrating 1  ?Moving slowly or fidgety/restless 1  ?Suicidal thoughts 0  ?PHQ-9 Score 8  ?Difficult doing work/chores Not difficult at all  ? ?Subjective:  ? ?1. Other fatigue ?Kiptyn will continue activities. Braylon denies daytime somnolence and denies waking up still tired. Patient has a history of symptoms of daytime fatigue. Joe generally gets 4 hours of sleep per night, and states that he has generally restful sleep. Snoring is present. Apneic episodes is present. Epworth Sleepiness Score is 11.   ? ?2. SOB (shortness of breath) on exertion ?Loukas will continue  activities. Ramesses notes increasing shortness of breath with exercising and seems to be worsening over time with weight gain. He notes getting out of breath sooner with activity than he used to. This has not gotten worse recently. Ryken denies shortness of breath at rest or orthopnea.  ? ?3. Essential hypertension ?Fayez is taking spironolactone and Metoprolol currently. His blood pressure is controlled. His blood pressure today was 126/80. ? ?4. Congestive heart failure, unspecified HF chronicity, unspecified heart failure type ([place]) ?Welles is taking Entresto, Spironolactone and Lasix, Metoprolol. He has atrial fibrillation and cardiomegaly.  ? ?5. Atrial fibrillation with RVR ([place]) ?Jakin is currently taking amiodarone and Eliquis.  ? ?6. Sleep apnea in adult ?Jef wears his CPAP at night. He has fairly restful sleep.  ? ?7. Other hyperlipidemia ?Jiles is taking Lipitor currently.  ? ?8. Vitamin D deficiency ?Alexzander is currently taking Vitamin D.  ? ?9. Pre-diabetes ?Bliss is taking Metformin and Wilder Glade currently. His last A1C was 5.9. ? ?Assessment/Plan:  ? ?1. Other fatigue ?Paddy will continue to slowly increase activities and exercise. We will check TSH today. Estil does feel that his weight is causing his energy to be lower than it should be. Fatigue may be related to obesity, depression or many other causes. Labs will be ordered, and in the meanwhile, Arsal will focus on self care including making healthy food choices, increasing physical activity and focusing on stress reduction.  ?- EKG 12-Lead ?- TSH+T4F+T3Free ? ?2. SOB (shortness of breath) on exertion ?Montray will continue to  slowly increase activities and exercise. We will check TSH today. Rishard does feel that he gets out of breath more easily that he used to when he exercises. Ryett's shortness of breath appears to be obesity related and exercise induced. He has agreed to work on weight loss and gradually increase exercise to treat his exercise  induced shortness of breath. Will continue to monitor closely.  ?- TSH+T4F+T3Free ? ?3. Essential hypertension ?Ronnald will continue taking his medications. He is working on healthy weight loss and exercise to improve blood pressure control. We will watch for signs of hypotension as he continues his lifestyle modifications. ? ?4. Congestive heart failure, unspecified HF chronicity, unspecified heart failure type ([place]) ?Tuvia will continue his medications. He will follow up with the cardiologist every 3 months. We will check CMP today.Disease process and medications reviewed with an emphasis on salt restriction, regular exercise, and regular weight monitoring.   ? ?- Comprehensive metabolic panel ? ?5. Atrial fibrillation with RVR ([place]) ?Ojas will continue his medications. He will follow up with cardiologist.  ? ?6. Sleep apnea in adult ?Albaraa will continue using his CPAP at night. Intensive lifestyle modifications are the first line treatment for this issue. We discussed several lifestyle modifications today and he will continue to work on diet, exercise and weight loss efforts. We will continue to monitor. Orders and follow up as documented in patient record.   ? ?7. Other hyperlipidemia ?Cardiovascular risk and specific lipid/LDL goals reviewed.  Rosser will continue taking his Lipitor. We will check CMP and Lipid panel today. We discussed several lifestyle modifications today and Hung will continue to work on diet, exercise and weight loss efforts. Orders and follow up as documented in patient record.  ? ?Counseling ?Intensive lifestyle modifications are the first line treatment for this issue. ?Dietary changes: Increase soluble fiber. Decrease simple carbohydrates. ?Exercise changes: Moderate to vigorous-intensity aerobic activity 150 minutes per week if tolerated. ?Lipid-lowering medications: see documented in medical record. ? ?- Comprehensive metabolic panel ?- Lipid Panel With LDL/HDL Ratio ? ?8. Vitamin D  deficiency ?Low Vitamin D level contributes to fatigue and are associated with obesity, breast, and colon cancer. Tydarius agrees to continue to take prescription Vitamin D and he will follow-up for routine testing of Vitamin D, at least 2-3 times per year to avoid over-replacement. We will check Vitamin D today. ? ?- VITAMIN D 25 Hydroxy (Vit-D Deficiency, Fractures) ? ?9. Pre-diabetes ?We will check A1C and insulin today. Bryce will continue to work on weight loss, exercise, and decreasing simple carbohydrates to help decrease the risk of diabetes.  ? ?- Comprehensive metabolic panel ?- Insulin, random ?- Hemoglobin A1c ? ?10. Depression screen ?Marquavious had a positive depression screening. Depression is commonly associated with obesity and often results in emotional eating behaviors. We will monitor this closely and work on CBT to help improve the non-hunger eating patterns. Referral to Psychology may be required if no improvement is seen as he continues in our clinic.  ? ?11. Class 2 obesity due to excess calories without serious comorbidity with body mass index (BMI) of 38.0 to 38.9 in adult ?Jerris is currently in the action stage of change and his goal is to continue with weight loss efforts. I recommend Vedh begin the structured treatment plan as follows: ? ?He has agreed to the Category 3 Plan. ? ?Tigran will continue meal planning. We reviewed labs from 07/23/2021 CMP and glucose. 06/05/2021 CBC and TSH. He will not skip meals. ? ?Exercise goals: No  exercise has been prescribed at this time.  ? ?Behavioral modification strategies: increasing lean protein intake, decreasing simple carbohydrates, increasing vegetables, increasing water intake, decreasing eating out, no skipping meals, meal planning and cooking strategies, keeping healthy foods in the home, and planning for success. ? ?He was informed of the importance of frequent follow-up visits to maximize his success with intensive lifestyle modifications for his  multiple health conditions. He was informed we would discuss his lab results at his next visit unless there is a critical issue that needs to be addressed sooner. Jahmeer agreed to keep his next visit at the agreed u

## 2022-01-04 ENCOUNTER — Encounter (INDEPENDENT_AMBULATORY_CARE_PROVIDER_SITE_OTHER): Payer: Self-pay | Admitting: Bariatrics

## 2022-01-04 DIAGNOSIS — R748 Abnormal levels of other serum enzymes: Secondary | ICD-10-CM | POA: Insufficient documentation

## 2022-01-04 DIAGNOSIS — R7303 Prediabetes: Secondary | ICD-10-CM | POA: Insufficient documentation

## 2022-01-04 DIAGNOSIS — N183 Chronic kidney disease, stage 3 unspecified: Secondary | ICD-10-CM | POA: Insufficient documentation

## 2022-01-04 DIAGNOSIS — E559 Vitamin D deficiency, unspecified: Secondary | ICD-10-CM | POA: Insufficient documentation

## 2022-01-14 ENCOUNTER — Ambulatory Visit (INDEPENDENT_AMBULATORY_CARE_PROVIDER_SITE_OTHER): Payer: Medicare Other | Admitting: Bariatrics

## 2022-01-14 ENCOUNTER — Encounter (INDEPENDENT_AMBULATORY_CARE_PROVIDER_SITE_OTHER): Payer: Self-pay | Admitting: Bariatrics

## 2022-01-14 VITALS — BP 116/76 | HR 77 | Temp 98.0°F | Ht 68.0 in | Wt 253.0 lb

## 2022-01-14 DIAGNOSIS — Z6838 Body mass index (BMI) 38.0-38.9, adult: Secondary | ICD-10-CM

## 2022-01-14 DIAGNOSIS — E669 Obesity, unspecified: Secondary | ICD-10-CM | POA: Diagnosis not present

## 2022-01-14 DIAGNOSIS — Z7984 Long term (current) use of oral hypoglycemic drugs: Secondary | ICD-10-CM

## 2022-01-14 DIAGNOSIS — E6609 Other obesity due to excess calories: Secondary | ICD-10-CM

## 2022-01-14 DIAGNOSIS — E559 Vitamin D deficiency, unspecified: Secondary | ICD-10-CM | POA: Diagnosis not present

## 2022-01-14 DIAGNOSIS — R748 Abnormal levels of other serum enzymes: Secondary | ICD-10-CM | POA: Diagnosis not present

## 2022-01-14 DIAGNOSIS — E1169 Type 2 diabetes mellitus with other specified complication: Secondary | ICD-10-CM | POA: Diagnosis not present

## 2022-01-14 MED ORDER — VITAMIN D (ERGOCALCIFEROL) 1.25 MG (50000 UNIT) PO CAPS: 50000.0000 [IU] | ORAL_CAPSULE | ORAL | 0 refills | Status: DC

## 2022-01-15 LAB — COMPREHENSIVE METABOLIC PANEL
ALT: 98 IU/L — ABNORMAL HIGH (ref 0–44)
AST: 63 IU/L — ABNORMAL HIGH (ref 0–40)
Albumin/Globulin Ratio: 1.4 (ref 1.2–2.2)
Albumin: 4.1 g/dL (ref 3.8–4.8)
Alkaline Phosphatase: 93 IU/L (ref 44–121)
BUN/Creatinine Ratio: 16 (ref 10–24)
BUN: 21 mg/dL (ref 8–27)
Bilirubin Total: 0.3 mg/dL (ref 0.0–1.2)
CO2: 21 mmol/L (ref 20–29)
Calcium: 9.4 mg/dL (ref 8.6–10.2)
Chloride: 104 mmol/L (ref 96–106)
Creatinine, Ser: 1.35 mg/dL — ABNORMAL HIGH (ref 0.76–1.27)
Globulin, Total: 3 g/dL (ref 1.5–4.5)
Glucose: 93 mg/dL (ref 70–99)
Potassium: 4.5 mmol/L (ref 3.5–5.2)
Sodium: 141 mmol/L (ref 134–144)
Total Protein: 7.1 g/dL (ref 6.0–8.5)
eGFR: 58 mL/min/{1.73_m2} — ABNORMAL LOW (ref >59)

## 2022-01-15 NOTE — Progress Notes (Signed)
? ? ? ?Chief Complaint:  ? ?OBESITY ?Christopher Wilkins is here to discuss his progress with his obesity treatment plan along with follow-up of his obesity related diagnoses. Christopher Wilkins is on the Category 3 Plan and states he is following his eating plan approximately 70 % of the time. Christopher Wilkins states he is walking 2-3 blocks 3-4 times per week. ? ?Today's visit was #: 2 ?Starting weight: 253 lbs ?Starting date: 12/31/2021 ?Today's weight: 253 lbs ?Today's date: 01/14/2022 ?Total lbs lost to date: 0 ?Total lbs lost since last in-office visit: 0 ? ?Interim History: Christopher Wilkins's weight remains the same weight since his last visit.  ? ?Subjective:  ? ?1. Vitamin D deficiency ?Christopher Wilkins's last Vitamin D was 27.5.  ? ?2. Diabetes mellitus type 2 in obese Christopher Wilkins) ?Christopher Wilkins is taking [place] and Metformin currently. His last A1C was 6.5. Insulin was 71.9. ? ?3. Elevated liver enzymes ?Christopher Wilkins is not on any new medications. He thinks his Lipitor was increased. Tylenol 1 6 pack per week. ? ?Assessment/Plan:  ? ?1. Vitamin D deficiency ?Low Vitamin D level contributes to fatigue and are associated with obesity, breast, and colon cancer. We will refill prescription Vitamin D 50,000 IU every week for 1 month with no refills and Christopher Wilkins will follow-up for routine testing of Vitamin D, at least 2-3 times per year to avoid over-replacement. ? ?- Vitamin D, Ergocalciferol, (DRISDOL) 1.25 MG (50000 UNIT) CAPS capsule; Take 1 capsule (50,000 Units total) by mouth every 7 (seven) days.  Dispense: 4 capsule; Refill: 0 ? ?2. Diabetes mellitus type 2 in obese Christopher Wilkins) ?Good blood sugar control is important to decrease the likelihood of diabetic complications such as nephropathy, neuropathy, limb loss, blindness, coronary artery disease, and death. Intensive lifestyle modification including diet, exercise and weight loss are the first line of treatment for diabetes.  ? ?3. Elevated liver enzymes ?We will recheck CMP today. He denies ETOH and Tylenol. We discussed the likely  diagnosis of non-alcoholic fatty liver disease today and how this condition is obesity related. Christopher Wilkins was educated the importance of weight loss. Christopher Wilkins agreed to continue with his weight loss efforts with healthier diet and exercise as an essential part of his treatment plan.  ? ?- Comprehensive metabolic panel ? ?4. Obesity, current BMI 38.6 ?Christopher Wilkins is currently in the action stage of change. As such, his goal is to continue with weight loss efforts. He has agreed to the Category 3 Plan.  ? ?Christopher Wilkins will continue meal planning and he will continue intentional eating. We reviewed labs from 12/31/2021 Vitamin D deficiency, A1C, insulin and thyroid panel.  ? ?Exercise goals:  As is.  ? ?Behavioral modification strategies: increasing lean protein intake, decreasing simple carbohydrates, increasing vegetables, increasing water intake, decreasing eating out, no skipping meals, meal planning and cooking strategies, keeping healthy foods in the home, and planning for success. ? ?Christopher Wilkins has agreed to follow-up with our clinic in 2 weeks with nurse practitioner and 4-5 weeks with myself. He was informed of the importance of frequent follow-up visits to maximize his success with intensive lifestyle modifications for his multiple health conditions.  ? ?Objective:  ? ?Blood pressure 116/76, pulse 77, temperature 98 ?F (36.7 ?C), height '5\' 8"'$  (1.727 m), weight 253 lb (114.8 kg), SpO2 99 %. ?Body mass index is 38.47 kg/m?. ? ?General: Cooperative, alert, well developed, in no acute distress. ?HEENT: Conjunctivae and lids unremarkable. ?Cardiovascular: Regular rhythm.  ?Lungs: Normal work of breathing. ?Neurologic: No focal deficits.  ? ?Lab Results  ?Component Value Date  ?  CREATININE 1.35 (H) 01/14/2022  ? BUN 21 01/14/2022  ? NA 141 01/14/2022  ? K 4.5 01/14/2022  ? CL 104 01/14/2022  ? CO2 21 01/14/2022  ? ?Lab Results  ?Component Value Date  ? ALT 98 (H) 01/14/2022  ? AST 63 (H) 01/14/2022  ? ALKPHOS 93 01/14/2022  ? BILITOT 0.3  01/14/2022  ? ?Lab Results  ?Component Value Date  ? HGBA1C 6.5 (H) 12/31/2021  ? HGBA1C 5.9 (H) 08/17/2020  ? HGBA1C 5.8 (H) 06/19/2020  ? ?Lab Results  ?Component Value Date  ? INSULIN 71.9 (H) 12/31/2021  ? ?Lab Results  ?Component Value Date  ? TSH 0.917 12/31/2021  ? ?Lab Results  ?Component Value Date  ? CHOL 186 12/31/2021  ? HDL 46 12/31/2021  ? LDLCALC 113 (H) 12/31/2021  ? TRIG 155 (H) 12/31/2021  ? CHOLHDL 3.2 06/19/2020  ? ?Lab Results  ?Component Value Date  ? VD25OH 27.3 (L) 12/31/2021  ? ?Lab Results  ?Component Value Date  ? WBC 5.2 06/05/2021  ? HGB 16.8 06/05/2021  ? HCT 52.0 (H) 06/05/2021  ? MCV 85 06/05/2021  ? PLT 231 06/05/2021  ? ?No results found for: IRON, TIBC, FERRITIN ? ?Attestation Statements:  ? ?Reviewed by clinician on day of visit: allergies, medications, problem list, medical history, surgical history, family history, social history, and previous encounter notes. ? ?I, Lizbeth Bark, RMA, am acting as transcriptionist for [employer], DO. ? ?I have reviewed the above documentation for accuracy and completeness, and I agree with the above. Jearld Lesch, DO  ?

## 2022-01-18 ENCOUNTER — Encounter (INDEPENDENT_AMBULATORY_CARE_PROVIDER_SITE_OTHER): Payer: Self-pay | Admitting: Bariatrics

## 2022-01-19 NOTE — Addendum Note (Signed)
Addended by: Lennette Bihari A on: 01/19/2022 10:39 AM ? ? Modules accepted: Orders ? ?

## 2022-01-19 NOTE — Progress Notes (Signed)
Pt advised, referral sent

## 2022-01-24 NOTE — Progress Notes (Signed)
?Cardiology Office Note:   ? ?Date:  01/29/2022  ? ?ID:  Christopher Wilkins., DOB 02/18/1956, MRN [id number] ? ?PCP:  [name], NP  ?Cardiologist:  Donato Heinz, MD  ?Electrophysiologist:  None  ? ?Referring MD: [name], NP  ? ?Chief Complaint  ?Patient presents with  ? Congestive Heart Failure  ? ? ? ?History of Present Illness:   ? ?Christopher Wilkins. is a 67 y.o. male with a hx of chronic combined systolic congestive heart failure, atrial fibrillation, tobacco use, hypertension who presents for follow-up.  He previously followed with Dr. Haroldine Laws in the advanced heart failure clinic.  He was admitted in September 2021 with acute combined heart failure and atrial fibrillation.  Echocardiogram showed LVEF 35 to 40%.  He underwent TEE/DCCV with restoration of sinus rhythm.  He was discharged on Eliquis, Entresto, metoprolol, and Lasix.  He was readmitted in November 2021 with acute heart failure and atrial fibrillation.  Echocardiogram showed EF was down to 20%.  He was found to be in cardiogenic shock with AKI, lactic acidosis, coox 44%.  He was started on milrinone and diuresed with IV Lasix.  RHC/LHC showed normal coronary arteries.  He was able to be weaned off milrinone and eventually underwent successful TEE/DCCV with restoration of sinus rhythm.  Hospital follow-up in December he was found to be back in atrial fibrillation.  Was referred to A. fib clinic and noted to be back in sinus rhythm.  Zio patch was placed, which showed no atrial fibrillation.  Echocardiogram on 12/05/2020 showed EF 50 to 55%, normal RV function, likely mild AS. ? ?Since last clinic visit, he reports that he is doing well.  Denies any chest pain, dyspnea, lightheadedness, syncope, lower extremity edema, or palpitations.  He has been walking 50 to 60 minutes every other day.  Denies any exertional symptoms.  Reports compliance with his CPAP.  Reports he quit smoking cigarettes.  Does smoke 1 cigar/week. ? ? ?Wt  Readings from Last 3 Encounters:  ?01/29/22 253 lb (114.8 kg)  ?01/14/22 253 lb (114.8 kg)  ?12/31/21 253 lb (114.8 kg)  ? ? ? ?Past Medical History:  ?Diagnosis Date  ? Atrial fibrillation ([place])   ? CHF (congestive heart failure) ([place])   ? Dyspnea   ? Hypertension   ? Insomnia   ? Prediabetes   ? Sleep apnea   ? SOB (shortness of breath)   ? ? ?Past Surgical History:  ?Procedure Laterality Date  ? CARDIOVERSION N/A 06/20/2020  ? Procedure: CARDIOVERSION;  Surgeon: Jerline Pain, MD;  Location: [facility] ENDOSCOPY;  Service: Cardiovascular;  Laterality: N/A;  ? CARDIOVERSION N/A 08/20/2020  ? Procedure: CARDIOVERSION;  Surgeon: [name], MD;  Location: [facility] ENDOSCOPY;  Service: Cardiovascular;  Laterality: N/A;  ? CHOLECYSTECTOMY    ? OTHER SURGICAL HISTORY    ? "Shot a couple of times"  ? RIGHT/LEFT HEART CATH AND CORONARY ANGIOGRAPHY N/A 08/18/2020  ? Procedure: RIGHT/LEFT HEART CATH AND CORONARY ANGIOGRAPHY;  Surgeon: [name], MD;  Location: [place] CV LAB;  Service: Cardiovascular;  Laterality: N/A;  ? TEE WITHOUT CARDIOVERSION N/A 06/20/2020  ? Procedure: TRANSESOPHAGEAL ECHOCARDIOGRAM (TEE);  Surgeon: Jerline Pain, MD;  Location: [facility] ENDOSCOPY;  Service: Cardiovascular;  Laterality: N/A;  ? TEE WITHOUT CARDIOVERSION N/A 08/20/2020  ? Procedure: TRANSESOPHAGEAL ECHOCARDIOGRAM (TEE);  Surgeon: [name], MD;  Location: [facility] ENDOSCOPY;  Service: Cardiovascular;  Laterality: N/A;  ? ? ?Current Medications: ?Current Meds  ?Medication Sig  ?  amiodarone (PACERONE) 200 MG tablet TAKE 1 TABLET (200 MG) BY ORAL ROUTE ONCE DAILY  ? apixaban (ELIQUIS) 5 MG TABS tablet TAKE 1 TABLET (5 MG TOTAL) BY MOUTH 2 (TWO) TIMES DAILY.  ? atorvastatin (LIPITOR) 40 MG tablet TAKE 1 TABLET (40 MG) BY ORAL ROUTE ONCE DAILY AT BEDTIME  ? spironolactone (ALDACTONE) 25 MG tablet TAKE 1 TABLET (25 MG) BY ORAL ROUTE ONCE DAILY  ?  ? ?Allergies:   Codeine, Lisinopril, and Shellfish allergy  ? ?Social History   ? ?Socioeconomic History  ? Marital status: Single  ?  Spouse name: Not on file  ? Number of children: Not on file  ? Years of education: Not on file  ? Highest education level: Not on file  ?Occupational History  ? Occupation: Retired  ?Tobacco Use  ? Smoking status: Some Days  ?  Packs/day: 0.25  ?  Types: Cigarettes  ?  Last attempt to quit: 05/14/2020  ?  Years since quitting: 1.7  ? Smokeless tobacco: Never  ? Tobacco comments:  ?  1-2 cigarettes daily  ?Vaping Use  ? Vaping Use: Never used  ?Substance and Sexual Activity  ? Alcohol use: Yes  ?  Alcohol/week: 2.0 standard drinks  ?  Types: 2 Cans of beer per week  ?  Comment: occasionally; once every 2 weeks  ? Drug use: No  ? Sexual activity: Not on file  ?Other Topics Concern  ? Not on file  ?Social History Narrative  ? Not on file  ? ?Social Determinants of Health  ? ?Financial Resource Strain: Not on file  ?Food Insecurity: Not on file  ?Transportation Needs: Not on file  ?Physical Activity: Not on file  ?Stress: Not on file  ?Social Connections: Not on file  ?  ? ?Family History: ?The patient's family history includes Cancer in his mother; High Cholesterol in his father; High blood pressure in his father; Obesity in his mother. There is no history of Colon cancer or Depression. ? ?ROS:   ?Please see the history of present illness.    ? All other systems reviewed and are negative. ? ?EKGs/Labs/Other Studies Reviewed:   ? ?The following studies were reviewed today: ? ? ?EKG:  EKG is  ordered today.  The ekg ordered today demonstrates normal sinus rhythm, rate 77, nonspecific T wave flattening, QTC 468 ? ? ?Recent Labs: ?06/05/2021: BNP 9.9; Hemoglobin 16.8; Platelets 231 ?07/23/2021: Magnesium 2.4 ?12/31/2021: TSH 0.917 ?01/14/2022: ALT 98; BUN 21; Creatinine, Ser 1.35; Potassium 4.5; Sodium 141  ?Recent Lipid Panel ?   ?Component Value Date/Time  ? CHOL 186 12/31/2021 0839  ? TRIG 155 (H) 12/31/2021 0839  ? HDL 46 12/31/2021 0839  ? CHOLHDL 3.2 06/19/2020 0120   ? VLDL 9 06/19/2020 0120  ? LDLCALC 113 (H) 12/31/2021 2202  ? ? ?Physical Exam:   ? ?VS:  BP 132/82   Pulse 75   Ht '5\' 6"'$  (5.427 m)   Wt 253 lb (114.8 kg)   SpO2 94%   BMI 40.84 kg/m?    ? ?Wt Readings from Last 3 Encounters:  ?01/29/22 253 lb (114.8 kg)  ?01/14/22 253 lb (114.8 kg)  ?12/31/21 253 lb (114.8 kg)  ?  ? ?GEN: Well nourished, well developed in no acute distress ?HEENT: Normal ?NECK: No JVD appreciated but difficult to assess given habitus; No carotid bruits ?CARDIAC: RRR, no murmurs, rubs, gallops ?RESPIRATORY:  Clear to auscultation without rales, wheezing or rhonchi  ?ABDOMEN: Soft, non-tender, non-distended ?MUSCULOSKELETAL:  No  edema; No deformity  ?SKIN: Warm and dry ?NEUROLOGIC:  Alert and oriented x 3 ?PSYCHIATRIC:  Normal affect  ? ?ASSESSMENT:   ? ?1. Atrial fibrillation with RVR ([place])   ?2. Chronic combined systolic and diastolic heart failure ([place])   ?3. Persistent atrial fibrillation ([place])   ?4. Essential hypertension   ?5. OSA (obstructive sleep apnea)   ?6. Tobacco use   ?7. Morbid obesity ([place])   ? ? ? ? ?PLAN:   ? ?Chronic Combined Systolic and Diastolic HF: admission 87/56 for a/c systolic heart failure>>Cardiogenic shock requiring milrinone, in setting of Afib w/ RVR.  EF 35-40% in 9/21 -> reduced down to 20% on repeat study 11/21.  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 Cath with normal cors. Echo 12/05/20  EF 50-55% RV normal  ?- Continue amiodarone to maintain sinus rhythm ?- Continue lasix 80 mg po daily.  Appears euvolemic.  Will check BMP/magnesium ?- Continue spiro 25 mg daily.  ?- Continue Entresto 49/51 BID ?- Continue Farxiga 10 mg daily   ?- Continue Toprol XL 25 mg daily.   ?  ?Persistent Atrial Fibrillation s/p DC-CV 9/21. Had ERAF>>underwent repeat DCCV 11/21 back to [facility].  Zio 12/21 with no AF ?- Continue amiodarone 200 mg once a day.  Normal LFTs, TSH, chest x-ray 06/05/2021.  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  Refer to EP to  consider ablation versus continuing amiodarone ?- Continue Toprol XL 25 mg daily.  ?- Continue eliquis 5 mg bid. Denies abnormal bleeding.  ?- Started CPAP for OSA ? ?HTN: Continue Entresto, metoprolol, spironolactone as above

## 2022-01-29 ENCOUNTER — Ambulatory Visit (INDEPENDENT_AMBULATORY_CARE_PROVIDER_SITE_OTHER): Payer: Medicare Other | Admitting: Cardiology

## 2022-01-29 ENCOUNTER — Encounter: Payer: Self-pay | Admitting: Cardiology

## 2022-01-29 VITALS — BP 132/82 | HR 75 | Ht 66.0 in | Wt 253.0 lb

## 2022-01-29 DIAGNOSIS — I5042 Chronic combined systolic (congestive) and diastolic (congestive) heart failure: Secondary | ICD-10-CM

## 2022-01-29 DIAGNOSIS — Z720 Tobacco use: Secondary | ICD-10-CM

## 2022-01-29 DIAGNOSIS — G4733 Obstructive sleep apnea (adult) (pediatric): Secondary | ICD-10-CM

## 2022-01-29 DIAGNOSIS — I10 Essential (primary) hypertension: Secondary | ICD-10-CM

## 2022-01-29 DIAGNOSIS — I4891 Unspecified atrial fibrillation: Secondary | ICD-10-CM

## 2022-01-29 DIAGNOSIS — I4819 Other persistent atrial fibrillation: Secondary | ICD-10-CM

## 2022-01-29 MED ORDER — FUROSEMIDE 80 MG PO TABS: 80.0000 mg | ORAL_TABLET | Freq: Every day | ORAL | 0 refills | Status: DC

## 2022-01-29 NOTE — Patient Instructions (Addendum)
Medication Instructions:  ?No medication changes ? ?*If you need a refill on your cardiac medications before your next appointment, please call your pharmacy* ? ? ?Lab Work: ?Your physician recommends that you return for lab work in: [place] (MAG/BMET) ? ?If you have labs (blood work) drawn today and your tests are completely normal, you will receive your results only by: ?MyChart Message (if you have MyChart) OR ?A paper copy in the mail ?If you have any lab test that is abnormal or we need to change your treatment, we will call you to review the results. ? ? ?Testing/Procedures: ?You have been referred to EP at [facility] - talk A.Fib Ablation ? ? ?Follow-Up: ?At [facility], you and your health needs are our priority.  As part of our continuing mission to provide you with exceptional heart care, we have created designated Provider Care Teams.  These Care Teams include your primary Cardiologist (physician) and Advanced Practice Providers (APPs -  Physician Assistants and Nurse Practitioners) who all work together to provide you with the care you need, when you need it. ? ?We recommend signing up for the patient portal called "MyChart".  Sign up information is provided on this After Visit Summary.  MyChart is used to connect with patients for Virtual Visits (Telemedicine).  Patients are able to view lab/test results, encounter notes, upcoming appointments, etc.  Non-urgent messages can be sent to your provider as well.   ?To learn more about what you can do with MyChart, go to [email or web address].   ? ?Your next appointment:   ?3 month(s) ? ?The format for your next appointment:   ?In Person ? ?Provider:   ?Almyra Deforest, PA-C    Then, Donato Heinz, MD will plan to see you again in 6 month(s).  ? ? ?Other Instructions ?none ? ?Important Information About Sugar ? ? ? ? ? ? ?

## 2022-01-30 LAB — BASIC METABOLIC PANEL
BUN/Creatinine Ratio: 20 (ref 10–24)
BUN: 26 mg/dL (ref 8–27)
CO2: 23 mmol/L (ref 20–29)
Calcium: 9.3 mg/dL (ref 8.6–10.2)
Chloride: 103 mmol/L (ref 96–106)
Creatinine, Ser: 1.3 mg/dL — ABNORMAL HIGH (ref 0.76–1.27)
Glucose: 96 mg/dL (ref 70–99)
Potassium: 4.6 mmol/L (ref 3.5–5.2)
Sodium: 142 mmol/L (ref 134–144)
eGFR: 61 mL/min/{1.73_m2} (ref >59)

## 2022-01-30 LAB — MAGNESIUM: Magnesium: 2.3 mg/dL (ref 1.6–2.3)

## 2022-02-02 ENCOUNTER — Encounter: Payer: Self-pay | Admitting: *Deleted

## 2022-02-15 ENCOUNTER — Encounter (INDEPENDENT_AMBULATORY_CARE_PROVIDER_SITE_OTHER): Payer: Self-pay | Admitting: Nurse Practitioner

## 2022-02-15 ENCOUNTER — Ambulatory Visit (INDEPENDENT_AMBULATORY_CARE_PROVIDER_SITE_OTHER): Payer: Medicare Other | Admitting: Nurse Practitioner

## 2022-02-15 VITALS — BP 110/71 | HR 88 | Temp 98.1°F | Ht 66.0 in | Wt 251.0 lb

## 2022-02-15 DIAGNOSIS — E559 Vitamin D deficiency, unspecified: Secondary | ICD-10-CM

## 2022-02-15 DIAGNOSIS — Z6838 Body mass index (BMI) 38.0-38.9, adult: Secondary | ICD-10-CM

## 2022-02-15 DIAGNOSIS — Z7984 Long term (current) use of oral hypoglycemic drugs: Secondary | ICD-10-CM

## 2022-02-15 DIAGNOSIS — R748 Abnormal levels of other serum enzymes: Secondary | ICD-10-CM | POA: Diagnosis not present

## 2022-02-15 DIAGNOSIS — E1169 Type 2 diabetes mellitus with other specified complication: Secondary | ICD-10-CM | POA: Diagnosis not present

## 2022-02-15 DIAGNOSIS — E669 Obesity, unspecified: Secondary | ICD-10-CM

## 2022-02-15 DIAGNOSIS — E6609 Other obesity due to excess calories: Secondary | ICD-10-CM

## 2022-02-17 NOTE — Progress Notes (Signed)
? ? ? ?Chief Complaint:  ? ?OBESITY ?Perrin is here to discuss his progress with his obesity treatment plan along with follow-up of his obesity related diagnoses. Osbaldo is on the Category 3 Plan and states he is following his eating plan approximately 50% of the time. [place] states he is walking for 30 minutes 2 times per week. ? ?Today's visit was #: 3 ?Starting weight: 253 lbs ?Starting date: 12/31/2021 ?Today's weight: 251 lbs ?Today's date: 02/15/2022 ?Total lbs lost to date: 2 lbs ?Total lbs lost since last in-office visit: 2 lbs ? ?Interim History: Vinh has done well weight loss since his last visit. He states it is hard to stay on the plan due to cooking at home for family and friends once every 2 weeks. He denies hunger or cravings. He is skipping lunch.  ? ?Subjective:  ? ?1. Diabetes mellitus type 2 in obese [facility]) ?Fernie's last A1C was 6.5 on 12/31/2021. She is taking Metformin and [place]. He denies side effects. He is on a statin.  ? ?2. Vitamin D deficiency ?Oshua is taking Vitamin D 2,000 IU. He recently took Vitamin D 50,000 IU weekly for 4 weeks and then started taking Vit D OTC. Denies side effects nausea, vomiting, and muscle weakness.  ? ?3. Elevated liver enzymes ?Timmey's last labs on 01/14/2022. Liver enzymes still elevated but mild improvement notefd  ? ?Assessment/Plan:  ? ?1. Diabetes mellitus type 2 in obese [facility]) ?Good blood sugar control is important to decrease the likelihood of diabetic complications such as nephropathy, neuropathy, limb loss, blindness, coronary artery disease, and death. Intensive lifestyle modification including diet, exercise and weight loss are the first line of treatment for diabetes. Tommaso will continue to follow up with his primary care provider. He will continue medications as directed . ? ?2. Vitamin D deficiency ?Low Vitamin D level contributes to fatigue and are associated with obesity, breast, and colon cancer. Raequon will continue Vitamin D 2,000 IU daily and  Cheyne will follow-up for routine testing of Vitamin D, at least 2-3 times per year to avoid over-replacement. ? ?3. Elevated liver enzymes ?Probably fatty liver. We discussed the likely diagnosis of non-alcoholic fatty liver disease today and how this condition is obesity related. Corbyn was educated the importance of weight loss. Wylie agreed to continue with his weight loss efforts with healthier diet and exercise as an essential part of his treatment plan. We will continue to monitor.  ? ?4. Obesity, current BMI 38.2 ?Sayvion is currently in the action stage of change. As such, his goal is to continue with weight loss efforts. He has agreed to the Category 3 Plan.  ? ?Yosiah will journal for dinner when eating with family and friends.  ? ?Exercise goals:  As is.  ? ?Behavioral modification strategies: increasing lean protein intake, increasing water intake, no skipping meals, and meal planning and cooking strategies. ? ?Shivam has agreed to follow-up with our clinic in 2 weeks. He was informed of the importance of frequent follow-up visits to maximize his success with intensive lifestyle modifications for his multiple health conditions.  ? ?Objective:  ? ?Blood pressure 110/71, pulse 88, temperature 98.1 ?F (36.7 ?C), height '5\' 6"'$  (1.676 m), weight 251 lb (113.9 kg), SpO2 98 %. ?Body mass index is 40.51 kg/m?. ? ?General: Cooperative, alert, well developed, in no acute distress. ?HEENT: Conjunctivae and lids unremarkable. ?Cardiovascular: Regular rhythm.  ?Lungs: Normal work of breathing. ?Neurologic: No focal deficits.  ? ?Lab Results  ?Component Value Date  ?  CREATININE 1.30 (H) 01/29/2022  ? BUN 26 01/29/2022  ? NA 142 01/29/2022  ? K 4.6 01/29/2022  ? CL 103 01/29/2022  ? CO2 23 01/29/2022  ? ?Lab Results  ?Component Value Date  ? ALT 98 (H) 01/14/2022  ? AST 63 (H) 01/14/2022  ? ALKPHOS 93 01/14/2022  ? BILITOT 0.3 01/14/2022  ? ?Lab Results  ?Component Value Date  ? HGBA1C 6.5 (H) 12/31/2021  ? HGBA1C 5.9 (H)  08/17/2020  ? HGBA1C 5.8 (H) 06/19/2020  ? ?Lab Results  ?Component Value Date  ? INSULIN 71.9 (H) 12/31/2021  ? ?Lab Results  ?Component Value Date  ? TSH 0.917 12/31/2021  ? ?Lab Results  ?Component Value Date  ? CHOL 186 12/31/2021  ? HDL 46 12/31/2021  ? LDLCALC 113 (H) 12/31/2021  ? TRIG 155 (H) 12/31/2021  ? CHOLHDL 3.2 06/19/2020  ? ?Lab Results  ?Component Value Date  ? VD25OH 27.3 (L) 12/31/2021  ? ?Lab Results  ?Component Value Date  ? WBC 5.2 06/05/2021  ? HGB 16.8 06/05/2021  ? HCT 52.0 (H) 06/05/2021  ? MCV 85 06/05/2021  ? PLT 231 06/05/2021  ? ?No results found for: IRON, TIBC, FERRITIN ? ?Attestation Statements:  ? ?Reviewed by clinician on day of visit: allergies, medications, problem list, medical history, surgical history, family history, social history, and previous encounter notes. ? ?Time spent on visit including pre-visit chart review and post-visit care and charting was 30 minutes.  ? ?I, Lizbeth Bark, RMA, am acting as Location manager for [employer], FNP. ? ?I have reviewed the above documentation for accuracy and completeness, and I agree with the above. [employer], FNP  ?

## 2022-03-01 ENCOUNTER — Encounter (INDEPENDENT_AMBULATORY_CARE_PROVIDER_SITE_OTHER): Payer: Self-pay | Admitting: Family Medicine

## 2022-03-01 ENCOUNTER — Ambulatory Visit (INDEPENDENT_AMBULATORY_CARE_PROVIDER_SITE_OTHER): Payer: Medicare Other | Admitting: Family Medicine

## 2022-03-01 VITALS — BP 105/67 | HR 69 | Temp 97.6°F | Ht 66.0 in | Wt 249.0 lb

## 2022-03-01 DIAGNOSIS — Z6841 Body Mass Index (BMI) 40.0 and over, adult: Secondary | ICD-10-CM | POA: Diagnosis not present

## 2022-03-01 DIAGNOSIS — E669 Obesity, unspecified: Secondary | ICD-10-CM

## 2022-03-01 DIAGNOSIS — E1169 Type 2 diabetes mellitus with other specified complication: Secondary | ICD-10-CM | POA: Diagnosis not present

## 2022-03-01 DIAGNOSIS — Z7984 Long term (current) use of oral hypoglycemic drugs: Secondary | ICD-10-CM | POA: Diagnosis not present

## 2022-03-01 MED ORDER — BLOOD GLUCOSE MONITOR KIT
PACK | 0 refills | Status: AC
Start: 2022-03-01 — End: ?

## 2022-03-04 ENCOUNTER — Other Ambulatory Visit: Payer: Self-pay | Admitting: Registered Nurse

## 2022-03-04 DIAGNOSIS — Z1231 Encounter for screening mammogram for malignant neoplasm of breast: Secondary | ICD-10-CM

## 2022-03-10 ENCOUNTER — Other Ambulatory Visit: Payer: Self-pay | Admitting: *Deleted

## 2022-03-11 ENCOUNTER — Encounter: Payer: Self-pay | Admitting: Internal Medicine

## 2022-03-15 NOTE — Progress Notes (Signed)
Chief Complaint:   OBESITY Felis is here to discuss his progress with his obesity treatment plan along with follow-up of his obesity related diagnoses. Hatcher is on the Category 3 Plan and states he is following his eating plan approximately 100% of the time. Gino states he is walking for 30 minutes 3 times per week.  Today's visit was #: 4 Starting weight: 253 lbs Starting date: 12/31/2021 Today's weight: 249 lbs Today's date: 03/01/2022 Total lbs lost to date: 4 Total lbs lost since last in-office visit: 2  Interim History: Magdaleno continues to do well with weight loss. He sometimes skips meals but not very often. He states his hunger is controlled.   Subjective:   1. Type 2 diabetes mellitus with other specified complication, unspecified whether long term insulin use (HCC) Gatlyn does not have a glucometer. He is on metformin and he does not have signs of hypoglycemia.   Assessment/Plan:   1. Type 2 diabetes mellitus with other specified complication, unspecified whether long term insulin use ([place]) We will send a glucometer and kit to the pharmacy. Louise will try to check his glucose in the morning.   - blood glucose meter kit and supplies KIT; Dispense based on patient and insurance preference. Use up to two times daily as directed.  Dispense: 1 each; Refill: 0  2. Obesity, Current BMI 40.3 Cheo is currently in the action stage of change. As such, his goal is to continue with weight loss efforts. He has agreed to the Category 3 Plan.   Exercise goals: As is.   Behavioral modification strategies: increasing lean protein intake, increasing water intake, and meal planning and cooking strategies.  Marrion has agreed to follow-up with our clinic in 2 weeks. He was informed of the importance of frequent follow-up visits to maximize his success with intensive lifestyle modifications for his multiple health conditions.   Objective:   Blood pressure 105/67, pulse 69, temperature  97.6 F (36.4 C), height '5\' 6"'  (1.676 m), weight 249 lb (112.9 kg), SpO2 96 %. Body mass index is 40.19 kg/m.  General: Cooperative, alert, well developed, in no acute distress. HEENT: Conjunctivae and lids unremarkable. Cardiovascular: Regular rhythm.  Lungs: Normal work of breathing. Neurologic: No focal deficits.   Lab Results  Component Value Date   CREATININE 1.30 (H) 01/29/2022   BUN 26 01/29/2022   NA 142 01/29/2022   K 4.6 01/29/2022   CL 103 01/29/2022   CO2 23 01/29/2022   Lab Results  Component Value Date   ALT 98 (H) 01/14/2022   AST 63 (H) 01/14/2022   ALKPHOS 93 01/14/2022   BILITOT 0.3 01/14/2022   Lab Results  Component Value Date   HGBA1C 6.5 (H) 12/31/2021   HGBA1C 5.9 (H) 08/17/2020   HGBA1C 5.8 (H) 06/19/2020   Lab Results  Component Value Date   INSULIN 71.9 (H) 12/31/2021   Lab Results  Component Value Date   TSH 0.917 12/31/2021   Lab Results  Component Value Date   CHOL 186 12/31/2021   HDL 46 12/31/2021   LDLCALC 113 (H) 12/31/2021   TRIG 155 (H) 12/31/2021   CHOLHDL 3.2 06/19/2020   Lab Results  Component Value Date   VD25OH 27.3 (L) 12/31/2021   Lab Results  Component Value Date   WBC 5.2 06/05/2021   HGB 16.8 06/05/2021   HCT 52.0 (H) 06/05/2021   MCV 85 06/05/2021   PLT 231 06/05/2021   No results found for: IRON, TIBC, FERRITIN  Obesity Behavioral Intervention:   Approximately 15 minutes were spent on the discussion below.  ASK: We discussed the diagnosis of obesity with Pilar Plate today and Zachory agreed to give [place] permission to discuss obesity behavioral modification therapy today.  ASSESS: Yandriel has the diagnosis of obesity and his BMI today is 40.3. Nasser is in the action stage of change.   ADVISE: Tobi was educated on the multiple health risks of obesity as well as the benefit of weight loss to improve his health. He was advised of the need for long term treatment and the importance of lifestyle modifications to  improve his current health and to decrease his risk of future health problems.  AGREE: Multiple dietary modification options and treatment options were discussed and Ysmael agreed to follow the recommendations documented in the above note.  ARRANGE: Barnet was educated on the importance of frequent visits to treat obesity as outlined per CMS and USPSTF guidelines and agreed to schedule his next follow up appointment today.  Attestation Statements:   Reviewed by clinician on day of visit: allergies, medications, problem list, medical history, surgical history, family history, social history, and previous encounter notes.   I, Trixie Dredge, am acting as transcriptionist for Dennard Nip, MD.  I have reviewed the above documentation for accuracy and completeness, and I agree with the above. -  Dennard Nip, MD

## 2022-03-18 ENCOUNTER — Ambulatory Visit (INDEPENDENT_AMBULATORY_CARE_PROVIDER_SITE_OTHER): Payer: Medicare Other | Admitting: Family Medicine

## 2022-03-18 ENCOUNTER — Encounter (INDEPENDENT_AMBULATORY_CARE_PROVIDER_SITE_OTHER): Payer: Self-pay | Admitting: Family Medicine

## 2022-03-18 VITALS — BP 100/60 | HR 61 | Temp 98.2°F | Ht 66.0 in | Wt 249.0 lb

## 2022-03-18 DIAGNOSIS — E1169 Type 2 diabetes mellitus with other specified complication: Secondary | ICD-10-CM

## 2022-03-18 DIAGNOSIS — Z7984 Long term (current) use of oral hypoglycemic drugs: Secondary | ICD-10-CM

## 2022-03-18 DIAGNOSIS — Z6841 Body Mass Index (BMI) 40.0 and over, adult: Secondary | ICD-10-CM

## 2022-03-18 DIAGNOSIS — E669 Obesity, unspecified: Secondary | ICD-10-CM | POA: Diagnosis not present

## 2022-03-18 DIAGNOSIS — E559 Vitamin D deficiency, unspecified: Secondary | ICD-10-CM | POA: Diagnosis not present

## 2022-03-18 DIAGNOSIS — K760 Fatty (change of) liver, not elsewhere classified: Secondary | ICD-10-CM

## 2022-03-18 MED ORDER — VITAMIN D (ERGOCALCIFEROL) 1.25 MG (50000 UNIT) PO CAPS: 50000.0000 [IU] | ORAL_CAPSULE | ORAL | 0 refills | Status: DC

## 2022-03-19 ENCOUNTER — Encounter: Payer: Self-pay | Admitting: Cardiology

## 2022-03-19 ENCOUNTER — Encounter: Payer: Self-pay | Admitting: *Deleted

## 2022-03-19 ENCOUNTER — Ambulatory Visit (INDEPENDENT_AMBULATORY_CARE_PROVIDER_SITE_OTHER): Payer: Medicare Other | Admitting: Cardiology

## 2022-03-19 VITALS — BP 100/62 | HR 68 | Ht 66.5 in | Wt 254.6 lb

## 2022-03-19 DIAGNOSIS — Z01812 Encounter for preprocedural laboratory examination: Secondary | ICD-10-CM | POA: Diagnosis not present

## 2022-03-19 DIAGNOSIS — I4819 Other persistent atrial fibrillation: Secondary | ICD-10-CM | POA: Diagnosis not present

## 2022-03-19 DIAGNOSIS — D6869 Other thrombophilia: Secondary | ICD-10-CM | POA: Diagnosis not present

## 2022-03-19 NOTE — Progress Notes (Signed)
Electrophysiology Office Note   Date:  03/19/2022   ID:  [name], DOB 12/31/1955, MRN [id number]  PCP:  Arthur Holms, NP  Cardiologist:  Gardiner Rhyme Primary Electrophysiologist:  Tremon Sainvil Meredith Leeds, MD    Chief Complaint: AF   History of Present Illness: Christopher Wilkins. is a 66 y.o. male who is being seen today for the evaluation of AF at the request of Christopher Wilkins*. Presenting today for electrophysiology evaluation.  He has a history significant for chronic combined systolic and diastolic heart failure, atrial fibrillation, tobacco abuse, hypertension.  He was initially followed in advanced heart failure clinic but his ejection fraction improved.  He was hospitalized September 2021 with acute heart failure.  He was cardioverted at that time.  His ejection fraction was down to 20%.  He was in cardiogenic shock with acute renal failure, lactic acidosis.  He was started on milrinone.  His ejection fraction has since improved.  Today, he denies symptoms of palpitations, chest pain, shortness of breath, orthopnea, PND, lower extremity edema, claudication, dizziness, presyncope, syncope, bleeding, or neurologic sequela. The patient is tolerating medications without difficulties.    Past Medical History:  Diagnosis Date   Atrial fibrillation (HCC)    CHF (congestive heart failure) (HCC)    Dyspnea    Hypertension    Insomnia    Prediabetes    Sleep apnea    SOB (shortness of breath)    Past Surgical History:  Procedure Laterality Date   CARDIOVERSION N/A 06/20/2020   Procedure: CARDIOVERSION;  Surgeon: Jerline Pain, MD;  Location: [facility];  Service: Cardiovascular;  Laterality: N/A;   CARDIOVERSION N/A 08/20/2020   Procedure: CARDIOVERSION;  Surgeon: [name], MD;  Location: [facility];  Service: Cardiovascular;  Laterality: N/A;   CHOLECYSTECTOMY     OTHER SURGICAL HISTORY     "Shot a couple of times"   RIGHT/LEFT HEART CATH AND CORONARY  ANGIOGRAPHY N/A 08/18/2020   Procedure: RIGHT/LEFT HEART CATH AND CORONARY ANGIOGRAPHY;  Surgeon: [name], MD;  Location: [facility];  Service: Cardiovascular;  Laterality: N/A;   TEE WITHOUT CARDIOVERSION N/A 06/20/2020   Procedure: TRANSESOPHAGEAL ECHOCARDIOGRAM (TEE);  Surgeon: Jerline Pain, MD;  Location: [facility] END[facility];  Service: Cardiovascular;  Laterality: N/A;   TEE WITHOUT CARDIOVERSION N/A 08/20/2020   Procedure: TRANSESOPHAGEAL ECHOCARDIOGRAM (TEE);  Surgeon: [name], MD;  Location: [facility] END[facility]COPY;  Service: Cardiovascular;  Laterality: N/A;     Current Outpatient Medications  Medication Sig Dispense Refill   albuterol (VENTOLIN HFA) 108 (90 Base) MCG/ACT inhaler SMARTSIG:1 Puff(s) Via Inhaler Every 4 Hours PRN     amiodarone (PACERONE) 200 MG tablet TAKE 1 TABLET (200 MG) BY ORAL ROUTE ONCE DAILY 90 tablet 3   apixaban (ELIQUIS) 5 MG TABS tablet TAKE 1 TABLET (5 MG TOTAL) BY MOUTH 2 (TWO) TIMES DAILY. 60 tablet 0   atorvastatin (LIPITOR) 80 MG tablet SMARTSIG:1 Tablet(s) By Mouth Every Evening     blood glucose meter kit and supplies KIT Dispense based on patient and insurance preference. Use up to two times daily as directed. 1 each 0   Blood Pressure Monitor DEVI Please provide patient with insurance approved blood pressure monitor I10.0 1 each 0   Cholecalciferol (VITAMIN D3 PO) Take 2,000 Units by mouth daily.     dapagliflozin propanediol (FARXIGA) 10 MG TABS tablet Take 10 mg by mouth daily.     ENTRESTO 49-51 MG Take 1 tablet by mouth 2 (two) times  daily.     furosemide (LASIX) 80 MG tablet Take 1 tablet (80 mg total) by mouth daily. 90 tablet 0   metFORMIN (GLUMETZA) 500 MG (MOD) 24 hr tablet Take 500 mg by mouth daily with breakfast.     metoprolol succinate (TOPROL-XL) 25 MG 24 hr tablet TAKE 1 TABLET (25 MG TOTAL) BY MOUTH DAILY. 30 tablet 0   Misc. Devices (GNP DIGITAL WEIGHT SCALE) MISC 1 Device by Does not apply route daily. Please provide  patient with insurance approved scale ICD110 I50.2 1 each 0   No current facility-administered medications for this visit.    Allergies:   Codeine, Lisinopril, and Shellfish allergy   Social History:  The patient  reports that he has been smoking cigarettes. He has been smoking an average of .25 packs per day. He has never used smokeless tobacco. He reports current alcohol use of about 2.0 standard drinks of alcohol per week. He reports that he does not use drugs.   Family History:  The patient's family history includes Cancer in his mother; High Cholesterol in his father; High blood pressure in his father; Obesity in his mother.    ROS:  Please see the history of present illness.   Otherwise, review of systems is positive for none.   All other systems are reviewed and negative.    PHYSICAL EXAM: VS:  BP 100/62   Pulse 68   Ht 5' 6.5" (1.689 m)   Wt 254 lb 9.6 oz (115.5 kg)   SpO2 95%   BMI 40.48 kg/m  , BMI Body mass index is 40.48 kg/m. GEN: Well nourished, well developed, in no acute distress  HEENT: normal  Neck: no JVD, carotid bruits, or masses Cardiac: RRR; no murmurs, rubs, or gallops,no edema  Respiratory:  clear to auscultation bilaterally, normal work of breathing GI: soft, nontender, nondistended, + BS MS: no deformity or atrophy  Skin: warm and dry Neuro:  Strength and sensation are intact Psych: euthymic mood, full affect  EKG:  EKG is not ordered today. Personal review of the ekg ordered 12/31/21 shows sinus rhythm  Recent Labs: 06/05/2021: BNP 9.9; Hemoglobin 16.8; Platelets 231 12/31/2021: TSH 0.917 01/14/2022: ALT 98 01/29/2022: BUN 26; Creatinine, Ser 1.30; Magnesium 2.3; Potassium 4.6; Sodium 142    Lipid Panel     Component Value Date/Time   CHOL 186 12/31/2021 0839   TRIG 155 (H) 12/31/2021 0839   HDL 46 12/31/2021 0839   CHOLHDL 3.2 06/19/2020 0120   VLDL 9 06/19/2020 0120   LDLCALC 113 (H) 12/31/2021 0839     Wt Readings from Last 3  Encounters:  03/19/22 254 lb 9.6 oz (115.5 kg)  03/18/22 249 lb (112.9 kg)  03/01/22 249 lb (112.9 kg)      Other studies Reviewed: Additional studies/ records that were reviewed today include: TTE 12/05/21  Review of the above records today demonstrates:   1. E/E' may not be accurate due to MAC 07371. Left ventricular ejection  fraction, by estimation, is 50 to 55%. The left ventricle has low normal  function. The left ventricle has no regional wall motion abnormalities.  The left ventricular internal cavity   size was mildly dilated. There is mild left ventricular hypertrophy. Left  ventricular diastolic parameters are consistent with Grade I diastolic  dysfunction (impaired relaxation).   2. Right ventricular systolic function is normal. The right ventricular  size is normal.   3. Degenerative with calcified subchordal structures . The mitral valve  is abnormal. Trivial  mitral valve regurgitation. No evidence of mitral  stenosis. Moderate mitral annular calcification.   4. Likely very mild AS peak velocity 42msec gardients / AVA not done .  The aortic valve is calcified. There is moderate calcification of the  aortic valve. Aortic valve regurgitation is not visualized. Mild to  moderate aortic valve  sclerosis/calcification is present, without any evidence of aortic  stenosis.   5. The inferior vena cava is normal in size with greater than 50%  respiratory variability, suggesting right atrial pressure of 3 mmHg.    ASSESSMENT AND PLAN:  1.  Persistent atrial fibrillation: Currently on amiodarone 200 mg daily, Toprol-XL 25 mg daily, Eliquis 5 mg twice daily.  CHA2DS2-VASc of 2.  Related get off of amiodarone.  He also has had a tachycardia mediated cardiomyopathy with improvement in his ejection fraction.  He would benefit from ablation.  Risk and benefits have been discussed.  He understands these risks and is agreed to the procedure.  Risk, benefits, and alternatives to EP  study and radiofrequency ablation for afib were also discussed in detail today. These risks include but are not limited to stroke, bleeding, vascular damage, tamponade, perforation, damage to the esophagus, lungs, and other structures, pulmonary vein stenosis, worsening renal function, and death. The patient understands these risk and wishes to proceed.  We Zaiyah Sottile therefore proceed with catheter ablation at the next available time.  Carto, ICE, anesthesia are requested for the procedure.  Tully Mcinturff also obtain CT PV protocol prior to the procedure to exclude LAA thrombus and further evaluate atrial anatomy.   2.  Chronic combined systolic and diastolic heart failure: Currently on optimal medical therapy per primary cardiology.  Ejection fraction is since improved.  3.  Obstructive sleep apnea: CPAP compliance encouraged  4.  Morbid obesity: Follows with healthy weight and wellness Body mass index is 40.48 kg/m.  5.  Secondary hypercoagulable state: Currently on Eliquis for atrial fibrillation as above  Case discussed with primary cardiology  Current medicines are reviewed at length with the patient today.   The patient does not have concerns regarding his medicines.  The following changes were made today:  none  Labs/ tests ordered today include:  Orders Placed This Encounter  Procedures   CT CARDIAC MORPH/PULM VEIN W/CM&W/O CA SCORE   Basic metabolic panel   CBC     Disposition:   FU with Elisha Cooksey 3 months  Signed, Nesreen Albano MMeredith Leeds MD  03/19/2022 1:29 PM     [place] [phone](office) [phone](fax)

## 2022-03-19 NOTE — Patient Instructions (Signed)
Medication Instructions:  Your physician recommends that you continue on your current medications as directed. Please refer to the Current Medication list given to you today.  *If you need a refill on your cardiac medications before your next appointment, please call your pharmacy*   Lab Work: Pre procedure labs - see procedure instruction letter:  BMP & CBC  If you have labs (blood work) drawn today and your tests are completely normal, you will receive your results only by: MyChart Message (if you have MyChart) OR A paper copy in the mail If you have any lab test that is abnormal or we need to change your treatment, we will call you to review the results.   Testing/Procedures: Your physician has requested that you have cardiac CT within 7 days PRIOR to your ablation. Cardiac computed tomography (CT) is a painless test that uses an x-ray machine to take clear, detailed pictures of your heart.  Please follow instruction below located under "other instructions". You will get a call from our office to schedule the date for this test.  Your physician has recommended that you have an ablation. Catheter ablation is a medical procedure used to treat some cardiac arrhythmias (irregular heartbeats). During catheter ablation, a long, thin, flexible tube is put into a blood vessel in your groin (upper thigh), or neck. This tube is called an ablation catheter. It is then guided to your heart through the blood vessel. Radio frequency waves destroy small areas of heart tissue where abnormal heartbeats may cause an arrhythmia to start. Please follow instruction letter given to you today.   Follow-Up: At [facility], you and your health needs are our priority.  As part of our continuing mission to provide you with exceptional heart care, we have created designated Provider Care Teams.  These Care Teams include your primary Cardiologist (physician) and Advanced Practice Providers (APPs -  Physician  Assistants and Nurse Practitioners) who all work together to provide you with the care you need, when you need it.  Your next appointment:   1 month(s) after your ablation  The format for your next appointment:   In Person  Provider:   AFib clinic   Thank you for choosing [facility]!!   Ejay Lashley, RN [phone]    Other Instructions  Cardiac Ablation Cardiac ablation is a procedure to destroy (ablate) some heart tissue that is sending bad signals. These bad signals cause problems in heart rhythm. The heart has many areas that make these signals. If there are problems in these areas, they can make the heart beat in a way that is not normal. Destroying some tissues can help make the heart rhythm normal. Tell your doctor about: Any allergies you have. All medicines you are taking. These include vitamins, herbs, eye drops, creams, and over-the-counter medicines. Any problems you or family members have had with medicines that make you fall asleep (anesthetics). Any blood disorders you have. Any surgeries you have had. Any medical conditions you have, such as kidney failure. Whether you are pregnant or may be pregnant. What are the risks? This is a safe procedure. But problems may occur, including: Infection. Bruising and bleeding. Bleeding into the chest. Stroke or blood clots. Damage to nearby areas of your body. Allergies to medicines or dyes. The need for a pacemaker if the normal system is damaged. Failure of the procedure to treat the problem. What happens before the procedure? Medicines Ask your doctor about: Changing or stopping your normal medicines. This is   important. Taking aspirin and ibuprofen. Do not take these medicines unless your doctor tells you to take them. Taking other medicines, vitamins, herbs, and supplements. General instructions Follow instructions from your doctor about what you cannot eat or drink. Plan to have someone take you home  from the hospital or clinic. If you will be going home right after the procedure, plan to have someone with you for 24 hours. Ask your doctor what steps will be taken to prevent infection. What happens during the procedure?  An IV tube will be put into one of your veins. You will be given a medicine to help you relax. The skin on your neck or groin will be numbed. A cut (incision) will be made in your neck or groin. A needle will be put through your cut and into a large vein. A tube (catheter) will be put into the needle. The tube will be moved to your heart. Dye may be put through the tube. This helps your doctor see your heart. Small devices (electrodes) on the tube will send out signals. A type of energy will be used to destroy some heart tissue. The tube will be taken out. Pressure will be held on your cut. This helps stop bleeding. A bandage will be put over your cut. The exact procedure may vary among doctors and hospitals. What happens after the procedure? You will be watched until you leave the hospital or clinic. This includes checking your heart rate, breathing rate, oxygen, and blood pressure. Your cut will be watched for bleeding. You will need to lie still for a few hours. Do not drive for 24 hours or as long as your doctor tells you. Summary Cardiac ablation is a procedure to destroy some heart tissue. This is done to treat heart rhythm problems. Tell your doctor about any medical conditions you may have. Tell him or her about all medicines you are taking to treat them. This is a safe procedure. But problems may occur. These include infection, bruising, bleeding, and damage to nearby areas of your body. Follow what your doctor tells you about food and drink. You may also be told to change or stop some of your medicines. After the procedure, do not drive for 24 hours or as long as your doctor tells you. This information is not intended to replace advice given to you by your  health care provider. Make sure you discuss any questions you have with your health care provider. Document Revised: 08/30/2019 Document Reviewed: 08/30/2019 Elsevier Patient Education  2023 [employer]  

## 2022-03-22 MED ORDER — VITAMIN D (ERGOCALCIFEROL) 1.25 MG (50000 UNIT) PO CAPS: 50000.0000 [IU] | ORAL_CAPSULE | ORAL | 0 refills | Status: DC

## 2022-03-23 DIAGNOSIS — K760 Fatty (change of) liver, not elsewhere classified: Secondary | ICD-10-CM | POA: Insufficient documentation

## 2022-03-23 DIAGNOSIS — E559 Vitamin D deficiency, unspecified: Secondary | ICD-10-CM | POA: Insufficient documentation

## 2022-03-25 NOTE — Progress Notes (Unsigned)
Chief Complaint:   OBESITY Christopher Wilkins is here to discuss his progress with his obesity treatment plan along with follow-up of his obesity related diagnoses. Christopher Wilkins is on the Category 3 Plan and states Christopher Wilkins is following his eating plan approximately ?% of the time. Christopher Wilkins states Christopher Wilkins is walking 7 times per week.  Today's visit was #: 5 Starting weight: 253 lbs Starting date: 12/31/2021 Today's weight: 249 lbs Today's date: 03/18/2022 Total lbs lost to date: 4 lbs Total lbs lost since last in-office visit: 0  Interim History: Christopher Wilkins is a patient of Christopher Wilkins.  1st office visit with me.  Has not been following meal plan much at all due to celebration eating with his family.  Usually skips lunch.  Subjective:   1. Type 2 diabetes mellitus with other specified complication, unspecified whether long term insulin use (Christopher Wilkins) Has not gotten his glucometer in the mail yet.  Denies lows or highs, Christopher Wilkins feels well.  Christopher Wilkins is on [place] and Metformin.  A1c on 12/31/21 was 6.5.  2. NAFLD (nonalcoholic fatty liver disease) Christopher Wilkins has upcoming appointment with Christopher Wilkins with Christopher Wilkins for evaluation of his elevated liver enzymes, they were last checked 2 months ago, ALT, 98, AST 6.3.  3. Vitamin D deficiency Last Vitamin D level was 27.3 was about 2 months ago.   Assessment/Plan:  No orders of the defined types were placed in this encounter.   Medications Discontinued During This Encounter  Medication Reason   Vitamin D, Ergocalciferol, (DRISDOL) 1.25 MG (50000 UNIT) CAPS capsule Reorder     Meds ordered this encounter  Medications   DISCONTD: Vitamin D, Ergocalciferol, (DRISDOL) 1.25 MG (50000 UNIT) CAPS capsule    Sig: Take 1 capsule (50,000 Units total) by mouth every 7 (seven) days.    Dispense:  4 capsule    Refill:  0   Vitamin D, Ergocalciferol, (DRISDOL) 1.25 MG (50000 UNIT) CAPS capsule    Sig: Take 1 capsule (50,000 Units total) by mouth every 7 (seven) days.    Dispense:  4 capsule     Refill:  0     1. Type 2 diabetes mellitus with other specified complication, unspecified whether long term insulin use (Christopher Wilkins) Continue medications per PCP and specialists.  Obtain glucometer and check fasting blood sugars 2 hours post prandial after largest meal.   2. NAFLD (nonalcoholic fatty liver disease) Christopher Wilkins discussed the likely diagnosis of non-alcoholic fatty liver disease today and how this condition is obesity related. Christopher Wilkins was educated the importance of weight loss. Christopher Wilkins agreed to continue with his weight loss efforts with healthier diet and exercise as an essential part of his treatment plan.  Continue prudent nutritional plan and weight loss, follow up with Wilkins planned.   3. Vitamin D deficiency Low Vitamin D level contributes to fatigue and are associated with obesity, breast, and colon cancer. Christopher Wilkins agrees to continue to take prescription Vitamin D '@50'$ ,000 IU every week and will follow-up for routine testing of Vitamin D, at least 2-3 times per year to avoid over-replacement. Refill Ergocalciferol, see below.  - Vitamin D, Ergocalciferol, (DRISDOL) 1.25 MG (50000 UNIT) CAPS capsule; Take 1 capsule (50,000 Units total) by mouth every 7 (seven) days.  Dispense: 4 capsule; Refill: 0  4. Obesity, Current BMI 40.3 Discussed with Christopher Wilkins strategies for family events, cookouts.  Also, handout on recipes given today.   Christopher Wilkins is currently in the action stage of change. As such, his goal is to continue with weight  loss efforts. Christopher Wilkins has agreed to the Category 3 Plan.   Exercise goals: Christopher Wilkins should determine their level of effort for physical activity relative to their level of fitness.   Behavioral modification strategies: no skipping meals, celebration eating strategies, and planning for success.  Christopher Wilkins has agreed to follow-up with our clinic in 3 weeks with Christopher Wilkins. Christopher Wilkins was informed of the importance of frequent follow-up visits to maximize his success with intensive lifestyle  modifications for his multiple health conditions.   Objective:   Blood pressure 100/60, pulse 61, temperature 98.2 F (36.8 C), height '5\' 6"'$  (1.676 m), weight 249 lb (112.9 kg), SpO2 97 %. Body mass index is 40.19 kg/m.  General: Cooperative, alert, well developed, in no acute distress. HEENT: Conjunctivae and lids unremarkable. Cardiovascular: Regular rhythm.  Lungs: Normal work of breathing. Neurologic: No focal deficits.   Lab Results  Component Value Date   CREATININE 1.30 (H) 01/29/2022   BUN 26 01/29/2022   NA 142 01/29/2022   K 4.6 01/29/2022   CL 103 01/29/2022   CO2 23 01/29/2022   Lab Results  Component Value Date   ALT 98 (H) 01/14/2022   AST 63 (H) 01/14/2022   ALKPHOS 93 01/14/2022   BILITOT 0.3 01/14/2022   Lab Results  Component Value Date   HGBA1C 6.5 (H) 12/31/2021   HGBA1C 5.9 (H) 08/17/2020   HGBA1C 5.8 (H) 06/19/2020   Lab Results  Component Value Date   INSULIN 71.9 (H) 12/31/2021   Lab Results  Component Value Date   TSH 0.917 12/31/2021   Lab Results  Component Value Date   CHOL 186 12/31/2021   HDL 46 12/31/2021   LDLCALC 113 (H) 12/31/2021   TRIG 155 (H) 12/31/2021   CHOLHDL 3.2 06/19/2020   Lab Results  Component Value Date   VD25OH 27.3 (L) 12/31/2021   Lab Results  Component Value Date   WBC 5.2 06/05/2021   HGB 16.8 06/05/2021   HCT 52.0 (H) 06/05/2021   MCV 85 06/05/2021   PLT 231 06/05/2021   No results found for: "IRON", "TIBC", "FERRITIN"  Obesity Behavioral Intervention:   Approximately 15 minutes were spent on the discussion below.  ASK: Christopher Wilkins discussed the diagnosis of obesity with Christopher Wilkins today and Christopher Wilkins agreed to give [place] permission to discuss obesity behavioral modification therapy today.  ASSESS: Christopher Wilkins has the diagnosis of obesity and his BMI today is 40.3. Christopher Wilkins is in the action stage of change.   ADVISE: Christopher Wilkins was educated on the multiple health risks of obesity as well as the benefit of weight loss to  improve his health. Christopher Wilkins was advised of the need for long term treatment and the importance of lifestyle modifications to improve his current health and to decrease his risk of future health problems.  AGREE: Multiple dietary modification options and treatment options were discussed and Christopher Wilkins agreed to follow the recommendations documented in the above note.  ARRANGE: Christopher Wilkins was educated on the importance of frequent visits to treat obesity as outlined per CMS and USPSTF guidelines and agreed to schedule his next follow up appointment today.  Attestation Statements:   Reviewed by clinician on day of visit: allergies, medications, problem list, medical history, surgical history, family history, social history, and previous encounter notes.  Time spent on visit including pre-visit chart review and post-visit care and charting was 30 minutes.   I, Davy Pique, RMA, am acting as Location manager for [employer], DO.  I have reviewed the above documentation for  accuracy and completeness, and I agree with the above. Marjory Sneddon, D.O.  The [place] was signed into law in 2016 which includes the topic of electronic health records.  This provides immediate access to information in MyChart.  This includes consultation notes, operative notes, office notes, lab results and pathology reports.  If you have any questions about what you read please let us know at your next visit so Christopher Wilkins can discuss your concerns and take corrective action if need be.  Christopher Wilkins are right here with you.

## 2022-04-01 ENCOUNTER — Ambulatory Visit (INDEPENDENT_AMBULATORY_CARE_PROVIDER_SITE_OTHER): Payer: Medicare Other

## 2022-04-01 DIAGNOSIS — Z0000 Encounter for general adult medical examination without abnormal findings: Secondary | ICD-10-CM

## 2022-04-01 NOTE — Progress Notes (Addendum)
Subjective:   Christopher Wilkins. is a 66 y.o. male who presents for Medicare Annual/Subsequent preventive examination. I discussed the limitations of evaluation and management by telemedicine and the availability of in person appointments. The patient expressed understanding and agreed to proceed.   Visit performed by audio   Patient location: Home  Provider location: Home  Review of Systems    N/A       Objective:    There were no vitals filed for this visit. There is no height or weight on file to calculate BMI.     04/01/2022   11:43 AM 12/13/2020    7:06 AM 11/12/2020    9:28 AM 08/14/2020   11:24 AM 08/03/2020    7:56 PM 06/19/2020   12:01 AM 08/27/2019   11:36 AM  Advanced Directives  Does Patient Have a Medical Advance Directive? _0  No No  Would patient like information on creating a medical advance directive?  No - Patient declined No - Patient declined No - Patient declined No - Patient declined No - Patient declined No - Patient declined    Current Medications (verified) Outpatient Encounter Medications as of 04/01/2022  Medication Sig   albuterol (VENTOLIN HFA) 108 (90 Base) MCG/ACT inhaler SMARTSIG:1 Puff(s) Via Inhaler Every 4 Hours PRN   amiodarone (PACERONE) 200 MG tablet TAKE 1 TABLET (200 MG) BY ORAL ROUTE ONCE DAILY   apixaban (ELIQUIS) 5 MG TABS tablet TAKE 1 TABLET (5 MG TOTAL) BY MOUTH 2 (TWO) TIMES DAILY.   atorvastatin (LIPITOR) 80 MG tablet SMARTSIG:1 Tablet(s) By Mouth Every Evening   blood glucose meter kit and supplies KIT Dispense based on patient and insurance preference. Use up to two times daily as directed.   Blood Pressure Monitor DEVI Please provide patient with insurance approved blood pressure monitor I10.0   Cholecalciferol (VITAMIN D3 PO) Take 2,000 Units by mouth daily.   dapagliflozin propanediol (FARXIGA) 10 MG TABS tablet Take 10 mg by mouth daily.   ENTRESTO 49-51 MG Take 1 tablet by mouth 2 (two) times daily.   furosemide  (LASIX) 80 MG tablet Take 1 tablet (80 mg total) by mouth daily.   metFORMIN (GLUMETZA) 500 MG (MOD) 24 hr tablet Take 500 mg by mouth daily with breakfast.   metoprolol succinate (TOPROL-XL) 25 MG 24 hr tablet TAKE 1 TABLET (25 MG TOTAL) BY MOUTH DAILY.   Misc. Devices (GNP DIGITAL WEIGHT SCALE) MISC 1 Device by Does not apply route daily. Please provide patient with insurance approved scale ICD110 I50.2   Vitamin D, Ergocalciferol, (DRISDOL) 1.25 MG (50000 UNIT) CAPS capsule Take 1 capsule (50,000 Units total) by mouth every 7 (seven) days.   No facility-administered encounter medications on file as of 04/01/2022.    Allergies (verified) Codeine, Lisinopril, and Shellfish allergy   History: Past Medical History:  Diagnosis Date   Atrial fibrillation (HCC)    CHF (congestive heart failure) (HCC)    Dyspnea    Hypertension    Insomnia    Prediabetes    Sleep apnea    SOB (shortness of breath)    Past Surgical History:  Procedure Laterality Date   CARDIOVERSION N/A 06/20/2020   Procedure: CARDIOVERSION;  Surgeon: Jerline Pain, MD;  Location: [facility];  Service: Cardiovascular;  Laterality: N/A;   CARDIOVERSION N/A 08/20/2020   Procedure: CARDIOVERSION;  Surgeon: [name], MD;  Location: [facility];  Service: Cardiovascular;  Laterality: N/A;   CHOLECYSTECTOMY     OTHER SURGICAL  HISTORY     "Shot a couple of times"   RIGHT/LEFT HEART CATH AND CORONARY ANGIOGRAPHY N/A 08/18/2020   Procedure: RIGHT/LEFT HEART CATH AND CORONARY ANGIOGRAPHY;  Surgeon: [name], MD;  Location: [place];  Service: Cardiovascular;  Laterality: N/A;   TEE WITHOUT CARDIOVERSION N/A 06/20/2020   Procedure: TRANSESOPHAGEAL ECHOCARDIOGRAM (TEE);  Surgeon: Jerline Pain, MD;  Location: [facility] ENDOSCOPY;  Service: Cardiovascular;  Laterality: N/A;   TEE WITHOUT CARDIOVERSION N/A 08/20/2020   Procedure: TRANSESOPHAGEAL ECHOCARDIOGRAM (TEE);  Surgeon: [name], MD;   Location: [facility] ENDOSCOPY;  Service: Cardiovascular;  Laterality: N/A;   Family History  Problem Relation Age of Onset   Cancer Mother    Obesity Mother    High blood pressure Father    High Cholesterol Father    Colon cancer Neg Hx    Depression Neg Hx    Social History   Socioeconomic History   Marital status: Single    Spouse name: Not on file   Number of children: Not on file   Years of education: Not on file   Highest education level: Not on file  Occupational History   Occupation: Retired  Tobacco Use   Smoking status: Some Days    Packs/day: 0.25    Types: Cigarettes    Last attempt to quit: 05/14/2020    Years since quitting: 1.8   Smokeless tobacco: Never   Tobacco comments:    1-2 cigarettes daily  Vaping Use   Vaping Use: Never used  Substance and Sexual Activity   Alcohol use: Yes    Alcohol/week: 2.0 standard drinks of alcohol    Types: 2 Cans of beer per week    Comment: occasionally; once every 2 weeks   Drug use: No   Sexual activity: Not on file  Other Topics Concern   Not on file  Social History Narrative   Not on file   Social Determinants of Health   Financial Resource Strain: Low Risk  (04/01/2022)   Overall Financial Resource Strain (CARDIA)    Difficulty of Paying Living Expenses: Not hard at all  Food Insecurity: No Food Insecurity (04/01/2022)   Hunger Vital Sign    Worried About Running Out of Food in the Last Year: Never true    Ran Out of Food in the Last Year: Never true  Transportation Needs: No Transportation Needs (04/01/2022)   PRAPARE - Hydrologist (Medical): No    Lack of Transportation (Non-Medical): No  Physical Activity: Insufficiently Active (04/01/2022)   Exercise Vital Sign    Days of Exercise per Week: 3 days    Minutes of Exercise per Session: 30 min  Stress: No Stress Concern Present (04/01/2022)   [place]    Feeling of  Stress : Not at all  Social Connections: Not on file    Tobacco Counseling Ready to quit: Not Answered Counseling given: Not Answered Tobacco comments: 1-2 cigarettes daily   Clinical Intake:  Pre-visit preparation completed: Yes  Pain : No/denies pain     Diabetes: Yes  How often do you need to have someone help you when you read instructions, pamphlets, or other written materials from your doctor or pharmacy?: 4 - Often What is the last grade level you completed in school?: 11th grade  Diabetic?Yes  Interpreter Needed?: No  Information entered by :: Anson [place] CMA   Activities of Daily Living  04/01/2022   11:41 AM  In your present state of health, do you have any difficulty performing the following activities:  Hearing? 1  Comment Pt uses hearing aids  Vision? 0  Difficulty concentrating or making decisions? 0  Walking or climbing stairs? 0  Dressing or bathing? 0  Doing errands, shopping? 0  Preparing Food and eating ? N  Using the Toilet? N  In the past six months, have you accidently leaked urine? N  Do you have problems with loss of bowel control? N  Managing your Medications? N  Managing your Finances? N  Housekeeping or managing your Housekeeping? N    Patient Care Team: Arthur Holms, NP as PCP - General (Nurse Practitioner) Donato Heinz, MD as PCP - Cardiology (Cardiology) Donato Heinz, MD as Consulting Physician (Cardiology)  Indicate any recent Medical Services you may have received from other than [facility] providers in the past year (date may be approximate).     Assessment:   This is a routine wellness examination for Christopher Wilkins.  Hearing/Vision screen No results found.  Dietary issues and exercise activities discussed:     Goals Addressed   None    Depression Screen    04/01/2022   11:41 AM 12/31/2021    7:06 AM 07/11/2020    9:14 AM  PHQ 2/9 Scores  PHQ - 2 Score 0 3 0  PHQ- 9 Score  8 0    Fall Risk     04/01/2022   11:40 AM 07/11/2020    9:02 AM  Christopher Wilkins in the past year? 0 0  Number falls in past yr: 0 0  Injury with Fall? 0 0  Risk for fall due to : No Fall Risks   Follow up Falls evaluation completed     FALL RISK PREVENTION PERTAINING TO THE HOME:  Any stairs in or around the home? Yes  If so, are there any without handrails? No  Home free of loose throw rugs in walkways, pet beds, electrical cords, etc? Yes  Adequate lighting in your home to reduce risk of falls? Yes   ASSISTIVE DEVICES UTILIZED TO PREVENT FALLS:  Life alert? No  Use of a cane, walker or w/c? No  Grab bars in the bathroom? Yes  Shower chair or bench in shower? No Elevated toilet seat or a handicapped toilet? No   TIMED UP AND GO:  Was the test performed? No .  Length of time to ambulate 10 feet: 0 sec.     Cognitive Function:        04/01/2022   11:41 AM  6CIT Screen  What Year? 0 points  What month? 0 points  What time? 0 points  Count back from 20 0 points  Months in reverse 0 points  Repeat phrase 0 points  Total Score 0 points    Immunizations Immunization History  Administered Date(s) Administered   Influenza,inj,Quad PF,6+ Mos 08/02/2017, 07/11/2020   Influenza-Unspecified 06/30/2021   Janssen (J&J) SARS-COV-2 Vaccination 12/22/2019   Moderna SARS-COV2 Booster Vaccination 11/04/2020   Pneumococcal Conjugate (Pcv15) 10/23/2021   Pneumococcal Polysaccharide-23 08/21/2020    TDAP status: Due, Education has been provided regarding the importance of this vaccine. Advised may receive this vaccine at local pharmacy or Health Dept. Aware to provide a copy of the vaccination record if obtained from local pharmacy or Health Dept. Verbalized acceptance and understanding.  Flu Vaccine status: Up to date  Pneumococcal vaccine status: Due, Education has been provided regarding  the importance of this vaccine. Advised may receive this vaccine at local pharmacy or Health Dept. Aware  to provide a copy of the vaccination record if obtained from local pharmacy or Health Dept. Verbalized acceptance and understanding. Pt completed vaccines  Covid-19 vaccine status: Information provided on how to obtain vaccines.   Qualifies for Shingles Vaccine? Yes   Zostavax completed No   Shingrix Completed?: No.    Education has been provided regarding the importance of this vaccine. Patient has been advised to call insurance company to determine out of pocket expense if they have not yet received this vaccine. Advised may also receive vaccine at local pharmacy or Health Dept. Verbalized acceptance and understanding.  Screening Tests Health Maintenance  Topic Date Due   FOOT EXAM  Never done   OPHTHALMOLOGY EXAM  Never done   TETANUS/TDAP  Never done   Zoster Vaccines- Shingrix (1 of 2) Never done   COLONOSCOPY (Pts 45-65yr Insurance coverage will need to be confirmed)  03/12/2020   COVID-19 Vaccine (2 - Booster for Janssen series) 12/30/2020   INFLUENZA VACCINE  05/11/2022   HEMOGLOBIN A1C  07/03/2022   Pneumonia Vaccine 66 Years old  Completed   Hepatitis C Screening  Completed   HPV VACCINES  Aged Out    Health Maintenance  Health Maintenance Due  Topic Date Due   FOOT EXAM  Never done   OPHTHALMOLOGY EXAM  Never done   TETANUS/TDAP  Never done   Zoster Vaccines- Shingrix (1 of 2) Never done   COLONOSCOPY (Pts 45-456yrInsurance coverage will need to be confirmed)  03/12/2020   COVID-19 Vaccine (2 - Booster for Janssen series) 12/30/2020    Colorectal cancer screening: Referral to GI placed Please order for pt. Pt aware the office will call re: appt.  Lung Cancer Screening: (Low Dose CT Chest recommended if Age 67-80ears, 30 pack-year currently smoking OR have quit w/in 15years.) does qualify.   Lung Cancer Screening Referral: No  Additional Screening:  Hepatitis C Screening: does qualify; Completed Yes  Vision Screening: Recommended annual ophthalmology exams  for early detection of glaucoma and other disorders of the eye. Is the patient up to date with their annual eye exam?  Yes  Who is the provider or what is the name of the office in which the patient attends annual eye exams? Pt does not remember the office name, [place] is not established with a provider, would they like to be referred to a provider to establish care? No .   Dental Screening: Recommended annual dental exams for proper oral hygiene  Community Resource Referral / Chronic Care Management: CRR required this visit?  No   CCM required this visit?  No      Plan:     I have personally reviewed and noted the following in the patient's chart:   Medical and social history Use of alcohol, tobacco or illicit drugs  Current medications and supplements including opioid prescriptions. Patient is not currently taking opioid prescriptions. Functional ability and status Nutritional status Physical activity Advanced directives List of other physicians Hospitalizations, surgeries, and ER visits in previous 12 months Vitals Screenings to include cognitive, depression, and falls Referrals and appointments  In addition, I have reviewed and discussed with patient certain preventive protocols, quality metrics, and best practice recommendations. A written personalized care plan for preventive services as well as general preventive health recommendations were provided to patient.    Christopher Wilkins Thank you  for taking time to come for your Medicare Wellness Visit. I appreciate your ongoing commitment to your health goals. Please review the following plan we discussed and let me know if I can assist you in the future.   These are the goals we discussed:  Goals   None     This is a list of the screening recommended for you and due dates:  Health Maintenance  Topic Date Due   Complete foot exam   Never done   Eye exam for diabetics  Never done   Tetanus Vaccine   Never done   Zoster (Shingles) Vaccine (1 of 2) Never done   Colon Cancer Screening  03/12/2020   COVID-19 Vaccine (2 - Booster for Janssen series) 12/30/2020   Flu Shot  05/11/2022   Hemoglobin A1C  07/03/2022   Pneumonia Vaccine  Completed   Hepatitis C Screening: USPSTF Recommendation to screen - Ages 18-79 yo.  Completed   HPV Vaccine  Aged [address] [place]   04/01/2022   Nurse Notes: Patient is requesting a life alert device, shower chair or bench for shower. Also patient has agreed to a colonoscopy. Please order the following care gaps for patient. Christopher Wilkins was informed of where he can receive the vaccines that are due. Christopher Wilkins vaccines have been updated from his [facility] vaccine record today.  I have reviewed and agreed to the above documentation.   Theresia Lo, FNP

## 2022-04-06 ENCOUNTER — Ambulatory Visit (INDEPENDENT_AMBULATORY_CARE_PROVIDER_SITE_OTHER): Payer: Medicare Other | Admitting: Bariatrics

## 2022-04-06 ENCOUNTER — Encounter (INDEPENDENT_AMBULATORY_CARE_PROVIDER_SITE_OTHER): Payer: Self-pay | Admitting: Bariatrics

## 2022-04-06 VITALS — BP 124/74 | HR 65 | Temp 97.7°F | Ht 66.0 in | Wt 252.0 lb

## 2022-04-06 DIAGNOSIS — E559 Vitamin D deficiency, unspecified: Secondary | ICD-10-CM

## 2022-04-06 DIAGNOSIS — Z6841 Body Mass Index (BMI) 40.0 and over, adult: Secondary | ICD-10-CM | POA: Diagnosis not present

## 2022-04-06 DIAGNOSIS — Z7984 Long term (current) use of oral hypoglycemic drugs: Secondary | ICD-10-CM

## 2022-04-06 DIAGNOSIS — R7303 Prediabetes: Secondary | ICD-10-CM

## 2022-04-06 DIAGNOSIS — E669 Obesity, unspecified: Secondary | ICD-10-CM

## 2022-04-06 MED ORDER — VITAMIN D (ERGOCALCIFEROL) 1.25 MG (50000 UNIT) PO CAPS: 50000.0000 [IU] | ORAL_CAPSULE | ORAL | 0 refills | Status: DC

## 2022-04-07 NOTE — Progress Notes (Unsigned)
Chief Complaint:   OBESITY Christopher Wilkins is here to discuss his progress with his obesity treatment plan along with follow-up of his obesity related diagnoses. Christopher Wilkins is on the Category 3 Plan and states he is following his eating plan approximately 0% of the time. Christopher Wilkins states he is walking for 30 minutes 3 times per week.  Today's visit was #: 6 Starting weight: 253 lbs Starting date: 12/31/2021 Today's weight: 252 lbs Today's date: 04/06/2022 Total lbs lost to date: 1 Total lbs lost since last in-office visit: 0  Interim History: Christopher Wilkins is a 3 pounds since his last visit.  Subjective:   1. Vitamin D deficiency Christopher Wilkins is currently taking vitamin D prescription.  2. Prediabetes Christopher Wilkins is taking [place] and metformin.  Assessment/Plan:   1. Vitamin D deficiency We will refill prescription vitamin D 50,000 units once weekly for 1 month.  - Vitamin D, Ergocalciferol, (DRISDOL) 1.25 MG (50000 UNIT) CAPS capsule; Take 1 capsule (50,000 Units total) by mouth every 7 (seven) days.  Dispense: 4 capsule; Refill: 0  2. Prediabetes Christopher Wilkins will continue his medications as directed, and we will follow-up at his next visit.  3. Obesity, Current BMI 40.8 Christopher Wilkins is currently in the action stage of change. As such, his goal is to continue with weight loss efforts. He has agreed to the Category 3 Plan.   Meal planning and intentional eating were discussed.  He will increase his water intake and decrease carbohydrates.  He will weigh his meat.  Exercise goals: As is.  Behavioral modification strategies: increasing lean protein intake, decreasing simple carbohydrates, increasing vegetables, increasing water intake, decreasing eating out, no skipping meals, meal planning and cooking strategies, keeping healthy foods in the home, and planning for success.  Christopher Wilkins has agreed to follow-up with our clinic in 2 to 3 weeks. He was informed of the importance of frequent follow-up visits to maximize his success  with intensive lifestyle modifications for his multiple health conditions.   Objective:   Blood pressure 124/74, pulse 65, temperature 97.7 F (36.5 C), height '5\' 6"'$  (1.676 m), weight 252 lb (114.3 kg), SpO2 95 %. Body mass index is 40.67 kg/m.  General: Cooperative, alert, well developed, in no acute distress. HEENT: Conjunctivae and lids unremarkable. Cardiovascular: Regular rhythm.  Lungs: Normal work of breathing. Neurologic: No focal deficits.   Lab Results  Component Value Date   CREATININE 1.30 (H) 01/29/2022   BUN 26 01/29/2022   NA 142 01/29/2022   K 4.6 01/29/2022   CL 103 01/29/2022   CO2 23 01/29/2022   Lab Results  Component Value Date   ALT 98 (H) 01/14/2022   AST 63 (H) 01/14/2022   ALKPHOS 93 01/14/2022   BILITOT 0.3 01/14/2022   Lab Results  Component Value Date   HGBA1C 6.5 (H) 12/31/2021   HGBA1C 5.9 (H) 08/17/2020   HGBA1C 5.8 (H) 06/19/2020   Lab Results  Component Value Date   INSULIN 71.9 (H) 12/31/2021   Lab Results  Component Value Date   TSH 0.917 12/31/2021   Lab Results  Component Value Date   CHOL 186 12/31/2021   HDL 46 12/31/2021   LDLCALC 113 (H) 12/31/2021   TRIG 155 (H) 12/31/2021   CHOLHDL 3.2 06/19/2020   Lab Results  Component Value Date   VD25OH 27.3 (L) 12/31/2021   Lab Results  Component Value Date   WBC 5.2 06/05/2021   HGB 16.8 06/05/2021   HCT 52.0 (H) 06/05/2021   MCV 85 06/05/2021  PLT 231 06/05/2021   No results found for: "IRON", "TIBC", "FERRITIN"  Attestation Statements:   Reviewed by clinician on day of visit: allergies, medications, problem list, medical history, surgical history, family history, social history, and previous encounter notes.   Wilhemena Durie, am acting as Location manager for [employer], DO.  I have reviewed the above documentation for accuracy and completeness, and I agree with the above. Jearld Lesch, DO

## 2022-04-08 ENCOUNTER — Encounter (INDEPENDENT_AMBULATORY_CARE_PROVIDER_SITE_OTHER): Payer: Self-pay | Admitting: Bariatrics

## 2022-04-08 ENCOUNTER — Ambulatory Visit: Payer: Medicare Other

## 2022-04-12 ENCOUNTER — Other Ambulatory Visit (HOSPITAL_COMMUNITY): Payer: Self-pay

## 2022-04-20 ENCOUNTER — Other Ambulatory Visit (HOSPITAL_COMMUNITY): Payer: Self-pay

## 2022-05-01 NOTE — Progress Notes (Unsigned)
Cardiology Office Note:    Date:  05/05/2022   ID:  [name], DOB October 29, 1955, MRN [id number]  PCP:  Arthur Holms, NP  Cardiologist:  Donato Heinz, MD  Electrophysiologist:  None   Referring MD: [name], NP   Chief Complaint  Patient presents with   Follow-up   Congestive Heart Failure     History of Present Illness:    Christopher Wilkins. is a 66 y.o. male with a hx of chronic combined systolic congestive heart failure, atrial fibrillation, tobacco use, hypertension who presents for follow-up.  He previously followed with Dr. Haroldine Laws in the advanced heart failure clinic.  He was admitted in September 2021 with acute combined heart failure and atrial fibrillation.  Echocardiogram showed LVEF 35 to 40%.  He underwent TEE/DCCV with restoration of sinus rhythm.  He was discharged on Eliquis, Entresto, metoprolol, and Lasix.  He was readmitted in November 2021 with acute heart failure and atrial fibrillation.  Echocardiogram showed EF was down to 20%.  He was found to be in cardiogenic shock with AKI, lactic acidosis, coox 44%.  He was started on milrinone and diuresed with IV Lasix.  RHC/LHC showed normal coronary arteries.  He was able to be weaned off milrinone and eventually underwent successful TEE/DCCV with restoration of sinus rhythm.  Hospital follow-up in December he was found to be back in atrial fibrillation.  Was referred to A. fib clinic and noted to be back in sinus rhythm.  Zio patch was placed, which showed no atrial fibrillation.  Echocardiogram on 12/05/2020 showed EF 50 to 55%, normal RV function, likely mild AS.  Since last clinic visit, he reports that he is doing well.  Denies any chest pain, dyspnea, lightheadedness, syncope, lower extremity edema, or palpitations.reports compliance with CPAP.  Previously had quit smoking but now is smoking 1 cigarette/day.   Wt Readings from Last 3 Encounters:  05/05/22 253 lb 9.6 oz (115 kg)  04/06/22 252 lb (114.3  kg)  03/19/22 254 lb 9.6 oz (115.5 kg)     Past Medical History:  Diagnosis Date   Atrial fibrillation (HCC)    CHF (congestive heart failure) (HCC)    Dyspnea    Hypertension    Insomnia    Prediabetes    Sleep apnea    SOB (shortness of breath)     Past Surgical History:  Procedure Laterality Date   CARDIOVERSION N/A 06/20/2020   Procedure: CARDIOVERSION;  Surgeon: Jerline Pain, MD;  Location: [facility];  Service: Cardiovascular;  Laterality: N/A;   CARDIOVERSION N/A 08/20/2020   Procedure: CARDIOVERSION;  Surgeon: [name], MD;  Location: [facility];  Service: Cardiovascular;  Laterality: N/A;   CHOLECYSTECTOMY     OTHER SURGICAL HISTORY     "Shot a couple of times"   RIGHT/LEFT HEART CATH AND CORONARY ANGIOGRAPHY N/A 08/18/2020   Procedure: RIGHT/LEFT HEART CATH AND CORONARY ANGIOGRAPHY;  Surgeon: [name], MD;  Location: [facility];  Service: Cardiovascular;  Laterality: N/A;   TEE WITHOUT CARDIOVERSION N/A 06/20/2020   Procedure: TRANSESOPHAGEAL ECHOCARDIOGRAM (TEE);  Surgeon: Jerline Pain, MD;  Location: [facility] ENDOSCOPY;  Service: Cardiovascular;  Laterality: N/A;   TEE WITHOUT CARDIOVERSION N/A 08/20/2020   Procedure: TRANSESOPHAGEAL ECHOCARDIOGRAM (TEE);  Surgeon: [name], MD;  Location: [facility] ENDOSCOPY;  Service: Cardiovascular;  Laterality: N/A;    Current Medications: Current Meds  Medication Sig   amiodarone (PACERONE) 200 MG tablet TAKE 1 TABLET (200 MG) BY ORAL  ROUTE ONCE DAILY   apixaban (ELIQUIS) 5 MG TABS tablet TAKE 1 TABLET (5 MG TOTAL) BY MOUTH 2 (TWO) TIMES DAILY.   atorvastatin (LIPITOR) 80 MG tablet SMARTSIG:1 Tablet(s) By Mouth Every Evening   Cholecalciferol (VITAMIN D3 PO) Take 2,000 Units by mouth daily.   dapagliflozin propanediol (FARXIGA) 10 MG TABS tablet Take 10 mg by mouth daily.   ENTRESTO 49-51 MG Take 1 tablet by mouth 2 (two) times daily.   furosemide (LASIX) 80 MG tablet Take 1 tablet (80 mg  total) by mouth daily.   metFORMIN (GLUMETZA) 500 MG (MOD) 24 hr tablet Take 500 mg by mouth daily with breakfast.   metoprolol succinate (TOPROL-XL) 25 MG 24 hr tablet TAKE 1 TABLET (25 MG TOTAL) BY MOUTH DAILY.     Allergies:   Codeine, Lisinopril, and Shellfish allergy   Social History   Socioeconomic History   Marital status: Single    Spouse name: Not on file   Number of children: Not on file   Years of education: Not on file   Highest education level: Not on file  Occupational History   Occupation: Retired  Tobacco Use   Smoking status: Some Days    Packs/day: 0.25    Types: Cigarettes    Last attempt to quit: 05/14/2020    Years since quitting: 1.9   Smokeless tobacco: Never   Tobacco comments:    1-2 cigarettes daily  Vaping Use   Vaping Use: Never used  Substance and Sexual Activity   Alcohol use: Yes    Alcohol/week: 2.0 standard drinks of alcohol    Types: 2 Cans of beer per week    Comment: occasionally; once every 2 weeks   Drug use: No   Sexual activity: Not on file  Other Topics Concern   Not on file  Social History Narrative   Not on file   Social Determinants of Health   Financial Resource Strain: Low Risk  (04/01/2022)   Overall Financial Resource Strain (CARDIA)    Difficulty of Paying Living Expenses: Not hard at all  Food Insecurity: No Food Insecurity (04/01/2022)   Hunger Vital Sign    Worried About Running Out of Food in the Last Year: Never true    Ran Out of Food in the Last Year: Never true  Transportation Needs: No Transportation Needs (04/01/2022)   PRAPARE - Hydrologist (Medical): No    Lack of Transportation (Non-Medical): No  Physical Activity: Insufficiently Active (04/01/2022)   Exercise Vital Sign    Days of Exercise per Week: 3 days    Minutes of Exercise per Session: 30 min  Stress: No Stress Concern Present (04/01/2022)   [place]     Feeling of Stress : Not at all  Social Connections: Not on file     Family History: The patient's family history includes Cancer in his mother; High Cholesterol in his father; High blood pressure in his father; Obesity in his mother. There is no history of Colon cancer or Depression.  ROS:   Please see the history of present illness.     All other systems reviewed and are negative.  EKGs/Labs/Other Studies Reviewed:    The following studies were reviewed today:   EKG:  EKG is  ordered today.  The ekg ordered today demonstrates normal sinus rhythm, rate 77, nonspecific T wave flattening, QTC 468   Recent Labs: 06/05/2021: BNP 9.9; Hemoglobin 16.8; Platelets 231  12/31/2021: TSH 0.917 01/14/2022: ALT 98 01/29/2022: BUN 26; Creatinine, Ser 1.30; Magnesium 2.3; Potassium 4.6; Sodium 142  Recent Lipid Panel    Component Value Date/Time   CHOL 186 12/31/2021 0839   TRIG 155 (H) 12/31/2021 0839   HDL 46 12/31/2021 0839   CHOLHDL 3.2 06/19/2020 0120   VLDL 9 06/19/2020 0120   LDLCALC 113 (H) 12/31/2021 0839    Physical Exam:    VS:  BP 138/88   Pulse 68   Ht '5\' 6"'$  (1.676 m)   Wt 253 lb 9.6 oz (115 kg)   SpO2 96%   BMI 40.93 kg/m     Wt Readings from Last 3 Encounters:  05/05/22 253 lb 9.6 oz (115 kg)  04/06/22 252 lb (114.3 kg)  03/19/22 254 lb 9.6 oz (115.5 kg)     GEN: Well nourished, well developed in no acute distress HEENT: Normal NECK: No JVD appreciated but difficult to assess given habitus; No carotid bruits CARDIAC: RRR, no murmurs, rubs, gallops RESPIRATORY:  Clear to auscultation without rales, wheezing or rhonchi  ABDOMEN: Soft, non-tender, non-distended MUSCULOSKELETAL:  No edema; No deformity  SKIN: Warm and dry NEUROLOGIC:  Alert and oriented x 3 PSYCHIATRIC:  Normal affect   ASSESSMENT:    1. Chronic combined systolic and diastolic heart failure (HCC)   2. Persistent atrial fibrillation ([place])   3. Essential hypertension   4. Encounter for  monitoring amiodarone therapy   5. Morbid obesity ([place])   6. Tobacco use      PLAN:    Chronic Combined Systolic and Diastolic HF: admission 21/30 for a/c systolic heart failure>>Cardiogenic shock requiring milrinone, in setting of Afib w/ RVR.  EF 35-40% in 9/21 -> reduced down to 20% on repeat study 11/21.  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 Cath with normal cors. Echo 12/05/20  EF 50-55% RV normal  - Continue amiodarone to maintain sinus rhythm.  Referred to EP, Dr. Curt Bears planning A-fib ablation 06/2022 - Continue lasix 80 mg po daily.  Appears euvolemic.  Will check CMP/magnesium - Continue spiro 25 mg daily.  - Continue Entresto 49/51 BID - Continue Farxiga 10 mg daily   - Continue Toprol XL 25 mg daily.     Persistent Atrial Fibrillation s/p DC-CV 9/21. Had ERAF>>underwent repeat DCCV 11/21 back to NSR.  Zio 12/21 with no AF - Continue amiodarone 200 mg once a day.  Normal LFTs, TSH, chest x-ray 06/05/2021.  Will check CMET, TSH, CXR. 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  Referred to EP, Dr. Curt Bears planning A-fib ablation 06/2022 - Continue Toprol XL 25 mg daily.  - Continue eliquis 5 mg bid. Denies abnormal bleeding.  - Started CPAP for OSA  HTN: Continue Entresto, metoprolol, spironolactone as above   OSA:  suspect untreated OSA led to A. fib which led to heart failure.  He reports compliance with CPAP  Tobacco use: Previously quit smoking but is now back to smoking 1 cigarette/day.  Strongly recommend cessation  Morbid obesity: Body mass index is 40.93 kg/m. Follows with healthy [facility]  RTC in 4 months    Medication Adjustments/Labs and Tests Ordered: Current medicines are reviewed at length with the patient today.  Concerns regarding medicines are outlined above.  Orders Placed This Encounter  Procedures   DG Chest 2 View   Comprehensive metabolic panel   TSH   Magnesium     No orders of the defined types were  placed in this encounter.  Patient Instructions  Medication Instructions:  Your physician recommends that you continue on your current medications as directed. Please refer to the Current Medication list given to you today.  *If you need a refill on your cardiac medications before your next appointment, please call your pharmacy*   Lab Work: CMET, Mag, TSH today  If you have labs (blood work) drawn today and your tests are completely normal, you will receive your results only by: [place] (if you have MyChart) OR A paper copy in the mail If you have any lab test that is abnormal or we need to change your treatment, we will call you to review the results.   Testing/Procedures: A chest x-ray takes a picture of the organs and structures inside the chest, including the heart, lungs, and blood vessels. This test can show several things, including, whether the heart is enlarges; whether fluid is building up in the lungs; and whether pacemaker / defibrillator leads are still in place. [place] [address]-no appointment needed M-F 8-4  Follow-Up: At [facility], you and your health needs are our priority.  As part of our continuing mission to provide you with exceptional heart care, we have created designated Provider Care Teams.  These Care Teams include your primary Cardiologist (physician) and Advanced Practice Providers (APPs -  Physician Assistants and Nurse Practitioners) who all work together to provide you with the care you need, when you need it.  We recommend signing up for the patient portal called "MyChart".  Sign up information is provided on this After Visit Summary.  MyChart is used to connect with patients for Virtual Visits (Telemedicine).  Patients are able to view lab/test results, encounter notes, upcoming appointments, etc.  Non-urgent messages can be sent to your provider as well.   To learn more about what you can do with MyChart, go to  [email or web address].    Your next appointment:   4 month(s)  The format for your next appointment:   In Person  Provider:   Donato Heinz, MD {    Important Information About Sugar         Signed, Donato Heinz, MD  05/05/2022 9:57 AM    [place]

## 2022-05-05 ENCOUNTER — Ambulatory Visit (INDEPENDENT_AMBULATORY_CARE_PROVIDER_SITE_OTHER): Payer: Medicare Other | Admitting: Cardiology

## 2022-05-05 ENCOUNTER — Encounter: Payer: Self-pay | Admitting: Cardiology

## 2022-05-05 VITALS — BP 138/88 | HR 68 | Ht 66.0 in | Wt 253.6 lb

## 2022-05-05 DIAGNOSIS — I5042 Chronic combined systolic (congestive) and diastolic (congestive) heart failure: Secondary | ICD-10-CM | POA: Diagnosis not present

## 2022-05-05 DIAGNOSIS — I4819 Other persistent atrial fibrillation: Secondary | ICD-10-CM | POA: Diagnosis not present

## 2022-05-05 DIAGNOSIS — Z720 Tobacco use: Secondary | ICD-10-CM

## 2022-05-05 DIAGNOSIS — Z5181 Encounter for therapeutic drug level monitoring: Secondary | ICD-10-CM

## 2022-05-05 DIAGNOSIS — I10 Essential (primary) hypertension: Secondary | ICD-10-CM | POA: Diagnosis not present

## 2022-05-05 DIAGNOSIS — Z79899 Other long term (current) drug therapy: Secondary | ICD-10-CM

## 2022-05-05 LAB — COMPREHENSIVE METABOLIC PANEL
ALT: 88 IU/L — ABNORMAL HIGH (ref 0–44)
AST: 49 IU/L — ABNORMAL HIGH (ref 0–40)
Albumin/Globulin Ratio: 1.2 (ref 1.2–2.2)
Albumin: 4.1 g/dL (ref 3.9–4.9)
Alkaline Phosphatase: 108 IU/L (ref 44–121)
BUN/Creatinine Ratio: 13 (ref 10–24)
BUN: 16 mg/dL (ref 8–27)
Bilirubin Total: 0.4 mg/dL (ref 0.0–1.2)
CO2: 23 mmol/L (ref 20–29)
Calcium: 9.1 mg/dL (ref 8.6–10.2)
Chloride: 106 mmol/L (ref 96–106)
Creatinine, Ser: 1.22 mg/dL (ref 0.76–1.27)
Globulin, Total: 3.3 g/dL (ref 1.5–4.5)
Glucose: 86 mg/dL (ref 70–99)
Potassium: 4 mmol/L (ref 3.5–5.2)
Sodium: 143 mmol/L (ref 134–144)
Total Protein: 7.4 g/dL (ref 6.0–8.5)
eGFR: 65 mL/min/{1.73_m2} (ref >59)

## 2022-05-05 LAB — MAGNESIUM: Magnesium: 2.2 mg/dL (ref 1.6–2.3)

## 2022-05-05 LAB — TSH: TSH: 0.828 u[IU]/mL (ref 0.450–4.500)

## 2022-05-05 NOTE — Patient Instructions (Addendum)
Medication Instructions:  Your physician recommends that you continue on your current medications as directed. Please refer to the Current Medication list given to you today.  *If you need a refill on your cardiac medications before your next appointment, please call your pharmacy*   Lab Work: CMET, Mag, TSH today  If you have labs (blood work) drawn today and your tests are completely normal, you will receive your results only by: [place] (if you have MyChart) OR A paper copy in the mail If you have any lab test that is abnormal or we need to change your treatment, we will call you to review the results.   Testing/Procedures: A chest x-ray takes a picture of the organs and structures inside the chest, including the heart, lungs, and blood vessels. This test can show several things, including, whether the heart is enlarges; whether fluid is building up in the lungs; and whether pacemaker / defibrillator leads are still in place. [place] [address]-no appointment needed M-F 8-4  Follow-Up: At [facility], you and your health needs are our priority.  As part of our continuing mission to provide you with exceptional heart care, we have created designated Provider Care Teams.  These Care Teams include your primary Cardiologist (physician) and Advanced Practice Providers (APPs -  Physician Assistants and Nurse Practitioners) who all work together to provide you with the care you need, when you need it.  We recommend signing up for the patient portal called "MyChart".  Sign up information is provided on this After Visit Summary.  MyChart is used to connect with patients for Virtual Visits (Telemedicine).  Patients are able to view lab/test results, encounter notes, upcoming appointments, etc.  Non-urgent messages can be sent to your provider as well.   To learn more about what you can do with MyChart, go to [email or web address].    Your next appointment:   4 month(s)  The format  for your next appointment:   In Person  Provider:   Donato Heinz, MD {    Important Information About Sugar

## 2022-05-07 ENCOUNTER — Encounter: Payer: Self-pay | Admitting: *Deleted

## 2022-05-10 ENCOUNTER — Ambulatory Visit
Admission: RE | Admit: 2022-05-10 | Discharge: 2022-05-10 | Disposition: A | Discharge status: Home / Self Care | Payer: Medicare Other | Source: Ambulatory Visit | Attending: Cardiology | Admitting: Cardiology

## 2022-05-10 ENCOUNTER — Encounter (INDEPENDENT_AMBULATORY_CARE_PROVIDER_SITE_OTHER): Payer: Self-pay | Admitting: Bariatrics

## 2022-05-10 ENCOUNTER — Ambulatory Visit (INDEPENDENT_AMBULATORY_CARE_PROVIDER_SITE_OTHER): Payer: Medicare Other | Admitting: Bariatrics

## 2022-05-10 ENCOUNTER — Encounter: Payer: Self-pay | Admitting: Internal Medicine

## 2022-05-10 ENCOUNTER — Other Ambulatory Visit (INDEPENDENT_AMBULATORY_CARE_PROVIDER_SITE_OTHER): Payer: Medicare Other

## 2022-05-10 ENCOUNTER — Ambulatory Visit (INDEPENDENT_AMBULATORY_CARE_PROVIDER_SITE_OTHER): Payer: Medicare Other | Admitting: Internal Medicine

## 2022-05-10 VITALS — BP 136/76 | HR 85 | Ht 66.0 in | Wt 252.0 lb

## 2022-05-10 VITALS — BP 125/80 | HR 63 | Temp 97.8°F | Ht 66.0 in | Wt 248.0 lb

## 2022-05-10 DIAGNOSIS — E559 Vitamin D deficiency, unspecified: Secondary | ICD-10-CM

## 2022-05-10 DIAGNOSIS — E669 Obesity, unspecified: Secondary | ICD-10-CM

## 2022-05-10 DIAGNOSIS — Z86010 Personal history of colonic polyps: Secondary | ICD-10-CM | POA: Diagnosis not present

## 2022-05-10 DIAGNOSIS — I4819 Other persistent atrial fibrillation: Secondary | ICD-10-CM

## 2022-05-10 DIAGNOSIS — E1169 Type 2 diabetes mellitus with other specified complication: Secondary | ICD-10-CM | POA: Diagnosis not present

## 2022-05-10 DIAGNOSIS — R7989 Other specified abnormal findings of blood chemistry: Secondary | ICD-10-CM

## 2022-05-10 DIAGNOSIS — Z6841 Body Mass Index (BMI) 40.0 and over, adult: Secondary | ICD-10-CM

## 2022-05-10 DIAGNOSIS — E6609 Other obesity due to excess calories: Secondary | ICD-10-CM

## 2022-05-10 DIAGNOSIS — Z79899 Other long term (current) drug therapy: Secondary | ICD-10-CM

## 2022-05-10 DIAGNOSIS — Z7984 Long term (current) use of oral hypoglycemic drugs: Secondary | ICD-10-CM

## 2022-05-10 DIAGNOSIS — I10 Essential (primary) hypertension: Secondary | ICD-10-CM

## 2022-05-10 DIAGNOSIS — Z7901 Long term (current) use of anticoagulants: Secondary | ICD-10-CM

## 2022-05-10 LAB — FERRITIN: Ferritin: 136.6 ng/mL (ref 22.0–322.0)

## 2022-05-10 LAB — PROTIME-INR
INR: 1.2 ratio — ABNORMAL HIGH (ref 0.8–1.0)
Prothrombin Time: 13.2 s — ABNORMAL HIGH (ref 9.6–13.1)

## 2022-05-10 MED ORDER — BLOOD PRESSURE MONITOR DEVI: 0 refills | Status: DC

## 2022-05-10 MED ORDER — VITAMIN D (ERGOCALCIFEROL) 1.25 MG (50000 UNIT) PO CAPS: 50000.0000 [IU] | ORAL_CAPSULE | ORAL | 0 refills | Status: DC

## 2022-05-10 MED ORDER — OZEMPIC (0.25 OR 0.5 MG/DOSE) 2 MG/3ML ~~LOC~~ SOPN: PEN_INJECTOR | SUBCUTANEOUS | 0 refills | Status: DC

## 2022-05-10 NOTE — Progress Notes (Signed)
Patient ID: Christopher Wilkins., male   DOB: 11/11/1955, 66 y.o.   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  He is here alone today.  He is known to me from 2016 colonoscopy.  This revealed a single 5 mm transverse colon adenoma.  There were 5 hyperplastic polyps removed from the rectosigmoid colon and rectum.  The exam was otherwise normal.  He reports that he was not even sure of the reason for this appointment.  He does not have specific abdominal complaint.  He denies history of jaundice, ascites.  Some lower extremity edema which he has related to heart failure though this is recently not been an issue.  He denies abdominal pain.  He has intermittent mild constipation and he will use Dulcolax 1 or 2 tablets 1 or 2 days/week.  This is longstanding.  No diarrhea.  No blood in stool or melena.  He denies heartburn, dysphagia and odynophagia.  No family history of liver disease  He does drink 1 can of beer per day typically of Budweiser.  2 or 3 years ago he was drinking heavily.  He estimates he would drink half gallon of vodka every 3 days.  He did this for about a years time.  Prior to this he did not consider himself to be a heavy drinker.  He does use tobacco.  Past Medical History:  Diagnosis Date   Atrial fibrillation (HCC)    CHF (congestive heart failure) (HCC)    Diabetes (HCC)    Dyspnea    Elevated LFTs    Hypertension    Insomnia    Prediabetes    Sleep apnea    SOB (shortness of breath)    Tubular adenoma of colon    Vitamin D deficiency     Past Surgical History:  Procedure Laterality Date   CARDIOVERSION N/A 06/20/2020   Procedure: CARDIOVERSION;   Surgeon: Jerline Pain, MD;  Location: [place];  Service: Cardiovascular;  Laterality: N/A;   CARDIOVERSION N/A 08/20/2020   Procedure: CARDIOVERSION;  Surgeon: [name], MD;  Location: [place];  Service: Cardiovascular;  Laterality: N/A;   CHOLECYSTECTOMY     OTHER SURGICAL HISTORY     "Shot a couple of times"   RIGHT/LEFT HEART CATH AND CORONARY ANGIOGRAPHY N/A 08/18/2020   Procedure: RIGHT/LEFT HEART CATH AND CORONARY ANGIOGRAPHY;  Surgeon: [name], MD;  Location: [place] [facility];  Service: Cardiovascular;  Laterality: N/A;   TEE WITHOUT CARDIOVERSION N/A 06/20/2020   Procedure: TRANSESOPHAGEAL ECHOCARDIOGRAM (TEE);  Surgeon: Jerline Pain, MD;  Location: [facility] END[facility];  Service: Cardiovascular;  Laterality: N/A;   TEE WITHOUT CARDIOVERSION N/A 08/20/2020   Procedure: TRANSESOPHAGEAL ECHOCARDIOGRAM (TEE);  Surgeon: [name], MD;  Location: [facility] ENDOSCOPY;  Service: Cardiovascular;  Laterality: N/A;    Outpatient Medications Prior to Visit  Medication Sig Dispense Refill   albuterol (VENTOLIN HFA) 108 (90 Base) MCG/ACT inhaler SMARTSIG:1 Puff(s) Via Inhaler Every 4 Hours PRN     amiodarone (PACERONE) 200 MG tablet TAKE 1 TABLET (200 MG) BY ORAL ROUTE ONCE DAILY 90 tablet 3   apixaban (ELIQUIS) 5 MG TABS tablet TAKE 1 TABLET (5 MG TOTAL) BY MOUTH  2 (TWO) TIMES DAILY. 60 tablet 0   atorvastatin (LIPITOR) 80 MG tablet SMARTSIG:1 Tablet(s) By Mouth Every Evening     blood glucose meter kit and supplies KIT Dispense based on patient and insurance preference. Use up to two times daily as directed. 1 each 0   Blood Pressure Monitor DEVI Please provide patient with insurance approved blood pressure monitor I10.0 1 each 0   Cholecalciferol (VITAMIN D3 PO) Take 2,000 Units by mouth daily.     dapagliflozin propanediol (FARXIGA) 10 MG TABS tablet Take 10 mg by mouth daily.     ENTRESTO 49-51 MG Take 1 tablet by mouth 2 (two) times daily.     metFORMIN  (GLUMETZA) 500 MG (MOD) 24 hr tablet Take 500 mg by mouth daily with breakfast.     metoprolol succinate (TOPROL-XL) 25 MG 24 hr tablet TAKE 1 TABLET (25 MG TOTAL) BY MOUTH DAILY. 30 tablet 0   Misc. Devices (GNP DIGITAL WEIGHT SCALE) MISC 1 Device by Does not apply route daily. Please provide patient with insurance approved scale ICD110 I50.2 1 each 0   Semaglutide,0.25 or 0.5MG/DOS, (OZEMPIC, 0.25 OR 0.5 MG/DOSE,) 2 MG/3ML SOPN Inject into the skin weekly 3 mL 0   Vitamin D, Ergocalciferol, (DRISDOL) 1.25 MG (50000 UNIT) CAPS capsule Take 1 capsule (50,000 Units total) by mouth every 7 (seven) days. 4 capsule 0   furosemide (LASIX) 80 MG tablet Take 1 tablet (80 mg total) by mouth daily. 90 tablet 0   No facility-administered medications prior to visit.    Allergies  Allergen Reactions   Codeine Nausea And Vomiting   Lisinopril Other (See Comments)    "Didn't do well in my body"   Shellfish Allergy Other (See Comments)    Can tolerate shrimp in 2021 (??)    Family History  Problem Relation Age of Onset   Cancer Mother    Obesity Mother    High blood pressure Father    High Cholesterol Father    Colon cancer Neg Hx    Depression Neg Hx     Social History   Tobacco Use   Smoking status: Some Days    Packs/day: 0.25    Types: Cigarettes    Last attempt to quit: 05/14/2020    Years since quitting: 1.9   Smokeless tobacco: Never   Tobacco comments:    1-2 cigarettes daily  Vaping Use   Vaping Use: Never used  Substance Use Topics   Alcohol use: Yes    Alcohol/week: 2.0 standard drinks of alcohol    Types: 2 Cans of beer per week    Comment: occasionally; once every 2 weeks   Drug use: No    ROS: As per history of present illness, otherwise negative  BP 136/76   Pulse 85   Ht _0  (1.676 m)   Wt 252 lb (114.3 kg)   BMI 40.67 kg/m  Gen: awake, alert, NAD HEENT: anicteric  CV: RRR, no mrg Pulm: CTA b/l Abd: soft, obese, NT/ND, +BS throughout Ext: no  c/c/e Neuro: nonfocal   RELEVANT LABS AND IMAGING: CBC    Component Value Date/Time   WBC 5.2 06/05/2021 0900   WBC 7.4 12/13/2020 0728   RBC 6.10 (H) 06/05/2021 0900   RBC 5.65 12/13/2020 0728   HGB 16.8 06/05/2021 0900   HCT 52.0 (H) 06/05/2021 0900   PLT 231 06/05/2021 0900   MCV 85 06/05/2021 0900   MCH 27.5 06/05/2021 0900   MCH 26.9 12/13/2020 0728  MCHC 32.3 06/05/2021 0900   MCHC 31.8 12/13/2020 0728   RDW 14.8 06/05/2021 0900   LYMPHSABS 3.3 08/05/2020 0208   MONOABS 0.6 08/05/2020 0208   EOSABS 0.1 08/05/2020 0208   BASOSABS 0.1 08/05/2020 0208    CMP     Component Value Date/Time   NA 143 05/05/2022 0847   K 4.0 05/05/2022 0847   CL 106 05/05/2022 0847   CO2 23 05/05/2022 0847   GLUCOSE 86 05/05/2022 0847   GLUCOSE 111 (H) 12/13/2020 0728   BUN 16 05/05/2022 0847   CREATININE 1.22 05/05/2022 0847   CALCIUM 9.1 05/05/2022 0847   PROT 7.4 05/05/2022 0847   ALBUMIN 4.1 05/05/2022 0847   AST 49 (H) 05/05/2022 0847   ALT 88 (H) 05/05/2022 0847   ALKPHOS 108 05/05/2022 0847   BILITOT 0.4 05/05/2022 0847   GFRNONAA >60 12/13/2020 0728   GFRAA 79 07/11/2020 1002    ASSESSMENT/PLAN: 66 year old male with a 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   Elevated liver enzymes --for the last 1 to 2 years he has had modest elevation in serum AST and ALT.  This could be medication induced in the setting of his heart failure but also certainly could be related to fatty liver disease.  Several years ago he was drinking heavily but now only drinking 1 beer per day.  He has metabolic risk factors for fatty liver disease as well.  We discussed this.  There is no evidence for advanced liver disease, portal  hypertension or cirrhosis clinically.  I recommended the following at this point: --Viral hepatitis studies, autoimmune and hereditary liver disease testing, rule out iron overload -- Abdominal ultrasound, evaluate hepatic parenchyma --After ablation in September he may be able to discontinue amiodarone which would be favorable from a liver perspective  2.  History of nonadvanced adenoma of the colon --surveillance colonoscopy due at this time.  We discussed colonoscopy and he is agreeable and wishes to proceed however I would like him to complete the atrial fibrillation ablation as scheduled in early September.  Once his ablation has occurred, it is likely he will need uninterrupted Eliquis for 1 month.  After that we can discuss a brief interruption of anticoagulation therapy and consultation with his cardiologist. --Surveillance colonoscopy later this year after atrial fibrillation ablation --Discussed surveillance colonoscopy at follow-up  October follow-up with me      AO:ZHYQM, Modena Morrow, Do [address] [place],  [place] [id number]

## 2022-05-10 NOTE — Patient Instructions (Addendum)
You have been scheduled for a follow up with Dr Hilarie Fredrickson on 07/28/22 at 10:10 am.  You have been scheduled for an abdominal ultrasound at [facility] Radiology (1st floor of hospital) on Friday 05/14/22 at 8:30 am. Please arrive 30 minutes prior to your appointment for registration. Make certain not to have anything to eat or drink 6 hours prior to your appointment. Should you need to reschedule your appointment, please contact radiology at [phone]. This test typically takes about 30 minutes to perform.  Your provider has requested that you go to the basement level for lab work before leaving today. Press "B" on the elevator. The lab is located at the first door on the left as you exit the elevator.  If you are age 66 or older, your body mass index should be between 23-30. Your Body mass index is 40.67 kg/m. If this is out of the aforementioned range listed, please consider follow up with your Primary Care Provider.  If you are age 13 or younger, your body mass index should be between 19-25. Your Body mass index is 40.67 kg/m. If this is out of the aformentioned range listed, please consider follow up with your Primary Care Provider.   ________________________________________________________  The [place] [facility] providers would like to encourage you to use [facility] to communicate with providers for non-urgent requests or questions.  Due to long hold times on the telephone, sending your provider a message by [facility] may be a faster and more efficient way to get a response.  Please allow 48 business hours for a response.  Please remember that this is for non-urgent requests.  _______________________________________________________  Due to recent changes in healthcare laws, you may see the results of your imaging and laboratory studies on MyChart before your provider has had a chance to review them.  We understand that in some cases there may be results that are confusing or concerning to you. Not all  laboratory results come back in the same time frame and the provider may be waiting for multiple results in order to interpret others.  Please give [place] 48 hours in order for your provider to thoroughly review all the results before contacting the office for clarification of your results.

## 2022-05-14 ENCOUNTER — Ambulatory Visit (HOSPITAL_COMMUNITY)
Admission: RE | Admit: 2022-05-14 | Discharge: 2022-05-14 | Disposition: A | Discharge status: Home / Self Care | Payer: Medicare Other | Source: Ambulatory Visit | Attending: Internal Medicine | Admitting: Internal Medicine

## 2022-05-14 DIAGNOSIS — R7989 Other specified abnormal findings of blood chemistry: Secondary | ICD-10-CM | POA: Diagnosis present

## 2022-05-16 LAB — ANTI-SMOOTH MUSCLE ANTIBODY, IGG: Actin (Smooth Muscle) Antibody (IGG): <20 U (ref <20)

## 2022-05-16 LAB — HEPATITIS B SURFACE ANTIGEN: Hepatitis B Surface Ag: NONREACTIVE

## 2022-05-16 LAB — HEPATITIS B CORE ANTIBODY, TOTAL: Hep B Core Total Ab: NONREACTIVE

## 2022-05-16 LAB — MITOCHONDRIAL ANTIBODIES: Mitochondrial M2 Ab, IgG: <=20 U (ref <=20.0)

## 2022-05-16 LAB — ALPHA-1-ANTITRYPSIN: A-1 Antitrypsin, Ser: 144 mg/dL (ref 83–199)

## 2022-05-16 LAB — IGG: IgG (Immunoglobin G), Serum: 1599 mg/dL — ABNORMAL HIGH (ref 600–1540)

## 2022-05-16 LAB — CERULOPLASMIN: Ceruloplasmin: 27 mg/dL (ref 18–36)

## 2022-05-16 LAB — ANA: Anti Nuclear Antibody (ANA): NEGATIVE

## 2022-05-16 LAB — HEPATITIS C ANTIBODY: Hepatitis C Ab: NONREACTIVE

## 2022-05-16 LAB — HEPATITIS B SURFACE ANTIBODY,QUALITATIVE: Hep B S Ab: NONREACTIVE

## 2022-05-16 LAB — HEPATITIS A ANTIBODY, TOTAL: Hepatitis A AB,Total: REACTIVE — AB

## 2022-05-19 ENCOUNTER — Encounter (INDEPENDENT_AMBULATORY_CARE_PROVIDER_SITE_OTHER): Payer: Self-pay

## 2022-05-19 ENCOUNTER — Encounter (INDEPENDENT_AMBULATORY_CARE_PROVIDER_SITE_OTHER): Payer: Self-pay | Admitting: Bariatrics

## 2022-05-19 NOTE — Progress Notes (Signed)
Chief Complaint:   OBESITY Christopher Wilkins is here to discuss his progress with his obesity treatment plan along with follow-up of his obesity related diagnoses. Christopher Wilkins is on the Category 3 Plan and states he is following his eating plan approximately 0% of the time. Christopher Wilkins states he is doing 0 minutes 0 times per week.  Today's visit was #: 7 Starting weight: 248 lbs Starting date: 12/31/2021 Today's weight: 248 lbs Today's date: 05/10/2022 Total lbs lost to date: 5 Total lbs lost since last in-office visit: 4  Interim History: Christopher Wilkins is down another 4 lbs since his last visit.  Subjective:   1. Vitamin D deficiency Christopher Wilkins is taking Vitamin D as directed.   2. Type 2 diabetes mellitus with other specified complication, unspecified whether long term insulin use (HCC) Christopher Wilkins is taking metformin and [place]. He denies contradictions.   3. Essential hypertension Christopher Wilkins's blood pressure is currently controlled.   Assessment/Plan:   1. Vitamin D deficiency We will refill prescription Vitamin D 50,000 IU once weekly for 1 month.  - Vitamin D, Ergocalciferol, (DRISDOL) 1.25 MG (50000 UNIT) CAPS capsule; Take 1 capsule (50,000 Units total) by mouth every 7 (seven) days.  Dispense: 4 capsule; Refill: 0  2. Type 2 diabetes mellitus with other specified complication, unspecified whether long term insulin use (HCC) Christopher Wilkins will continue his medications, and minimize carbohydrates. He agreed to start Ozempic 0.25 mg once weekly with no refills.   - Semaglutide,0.25 or 0.'5MG'$ /DOS, (OZEMPIC, 0.25 OR 0.5 MG/DOSE,) 2 MG/3ML SOPN; Inject into the skin weekly  Dispense: 3 mL; Refill: 0  3. Essential hypertension We will send a blood pressure monitor to the pharmacy. We will follow-[facility] at [facility] next visit.  - Blood Pressure Monitor DEVI; Please provide patient with insurance approved blood pressure monitor I10.0  Dispense: 1 each; Refill: 0  4. Obesity, current BMI 40.2 Christopher Wilkins is currently in the action  stage of change. As such, his goal is to continue with weight loss efforts. He has agreed to the Category 3 Plan.   Meal planning and intentional eating were discussed.   Ex[facility] goals[facility] [employer]  Behavioral modification strategies: increasing lean protein intake, decreasing simple carbohydrates, increasing vegetables, increasing water intake, decreasing eating out, no skipping meals, meal planning and cooking strategies, keeping healthy foods in the home, and planning for success.  Christopher Wilkins has agreed to follow-up with our clinic in 2 to 3 weeks. He was informed of the importance of frequent follow-up visits to maximize his success with intensive lifestyle modifications for his multiple health conditions.   Objective:   Blood pressure 125/80, pulse 63, temperature 97.8 F (36.6 C), height '5\' 6"'$  (1.676 m), weight 248 lb (112.5 kg), SpO2 96 %. Body mass index is 40.03 kg/m.  General: Cooperative, alert, well developed, in no acute distress. HEENT: Conjunctivae and lids unremarkable. Cardiovascular: Regular rhythm.  Lungs: Normal work of breathing. Neurologic: No focal deficits.   Lab Results  Component Value Date   CREATININE 1.22 05/05/2022   BUN 16 05/05/2022   NA 143 05/05/2022   K 4.0 05/05/2022   CL 106 05/05/2022   CO2 23 05/05/2022   Lab Results  Component Value Date   ALT 88 (H) 05/05/2022   AST 49 (H) 05/05/2022   ALKPHOS 108 05/05/2022   BILITOT 0.4 05/05/2022   Lab Results  Component Value Date   HGBA1C 6.5 (H) 12/31/2021   HGBA1C 5.9 (H) 08/17/2020   HGBA1C 5.8 (H) 06/19/2020  Lab Results  Component Value Date   INSULIN 71.9 (H) 12/31/2021   Lab Results  Component Value Date   TSH 0.828 05/05/2022   Lab Results  Component Value Date   CHOL 186 12/31/2021   HDL 46 12/31/2021   LDLCALC 113 (H) 12/31/2021   TRIG 155 (H) 12/31/2021   CHOLHDL 3.2 06/19/2020   Lab Results  Component Value Date   VD25OH 27.3 (L) 12/31/2021   Lab Results   Component Value Date   WBC 5.2 06/05/2021   HGB 16.8 06/05/2021   HCT 52.0 (H) 06/05/2021   MCV 85 06/05/2021   PLT 231 06/05/2021   Lab Results  Component Value Date   FERRITIN 136.6 05/10/2022   Attestation Statements:   Reviewed by clinician on day of visit: allergies, medications, problem list, medical history, surgical history, family history, social history, and previous encounter notes.  Wilhemena Durie, am acting as Location manager for [employer], DO.  I have reviewed the above documentation for accuracy and completeness, and I agree with the above. Jearld Lesch, DO

## 2022-05-22 ENCOUNTER — Emergency Department (HOSPITAL_COMMUNITY): Payer: Medicare Other

## 2022-05-22 ENCOUNTER — Other Ambulatory Visit: Payer: Self-pay

## 2022-05-22 ENCOUNTER — Emergency Department (HOSPITAL_COMMUNITY)
Admission: EM | Admit: 2022-05-22 | Discharge: 2022-05-22 | Disposition: A | Discharge status: Home / Self Care | Payer: Medicare Other | Attending: Emergency Medicine | Admitting: Emergency Medicine

## 2022-05-22 ENCOUNTER — Encounter (HOSPITAL_COMMUNITY): Payer: Self-pay | Admitting: Emergency Medicine

## 2022-05-22 DIAGNOSIS — R0682 Tachypnea, not elsewhere classified: Secondary | ICD-10-CM | POA: Insufficient documentation

## 2022-05-22 DIAGNOSIS — Z7901 Long term (current) use of anticoagulants: Secondary | ICD-10-CM | POA: Diagnosis not present

## 2022-05-22 DIAGNOSIS — R109 Unspecified abdominal pain: Secondary | ICD-10-CM | POA: Diagnosis present

## 2022-05-22 DIAGNOSIS — I4891 Unspecified atrial fibrillation: Secondary | ICD-10-CM | POA: Diagnosis not present

## 2022-05-22 DIAGNOSIS — E1122 Type 2 diabetes mellitus with diabetic chronic kidney disease: Secondary | ICD-10-CM | POA: Insufficient documentation

## 2022-05-22 DIAGNOSIS — I509 Heart failure, unspecified: Secondary | ICD-10-CM | POA: Insufficient documentation

## 2022-05-22 DIAGNOSIS — N183 Chronic kidney disease, stage 3 unspecified: Secondary | ICD-10-CM | POA: Insufficient documentation

## 2022-05-22 DIAGNOSIS — Z20822 Contact with and (suspected) exposure to covid-19: Secondary | ICD-10-CM | POA: Diagnosis not present

## 2022-05-22 DIAGNOSIS — I130 Hypertensive heart and chronic kidney disease with heart failure and stage 1 through stage 4 chronic kidney disease, or unspecified chronic kidney disease: Secondary | ICD-10-CM | POA: Diagnosis not present

## 2022-05-22 DIAGNOSIS — R059 Cough, unspecified: Secondary | ICD-10-CM | POA: Insufficient documentation

## 2022-05-22 DIAGNOSIS — J209 Acute bronchitis, unspecified: Secondary | ICD-10-CM

## 2022-05-22 LAB — CBC WITH DIFFERENTIAL/PLATELET
Abs Immature Granulocytes: 0.02 10*3/uL (ref 0.00–0.07)
Basophils Absolute: 0 10*3/uL (ref 0.0–0.1)
Basophils Relative: 1 %
Eosinophils Absolute: 0.1 10*3/uL (ref 0.0–0.5)
Eosinophils Relative: 3 %
HCT: 48.1 % (ref 39.0–52.0)
Hemoglobin: 15.5 g/dL (ref 13.0–17.0)
Immature Granulocytes: 1 %
Lymphocytes Relative: 44 %
Lymphs Abs: 1.8 10*3/uL (ref 0.7–4.0)
MCH: 27.6 pg (ref 26.0–34.0)
MCHC: 32.2 g/dL (ref 30.0–36.0)
MCV: 85.6 fL (ref 80.0–100.0)
Monocytes Absolute: 0.6 10*3/uL (ref 0.1–1.0)
Monocytes Relative: 14 %
Neutro Abs: 1.5 10*3/uL — ABNORMAL LOW (ref 1.7–7.7)
Neutrophils Relative %: 37 %
Platelets: 193 10*3/uL (ref 150–400)
RBC: 5.62 MIL/uL (ref 4.22–5.81)
RDW: 16.2 % — ABNORMAL HIGH (ref 11.5–15.5)
WBC: 4.1 10*3/uL (ref 4.0–10.5)
nRBC: 0 % (ref 0.0–0.2)

## 2022-05-22 LAB — URINALYSIS, ROUTINE W REFLEX MICROSCOPIC
Bacteria, UA: NONE SEEN
Bilirubin Urine: NEGATIVE
Glucose, UA: >=500 mg/dL — AB
Hgb urine dipstick: NEGATIVE
Ketones, ur: NEGATIVE mg/dL
Leukocytes,Ua: NEGATIVE
Nitrite: NEGATIVE
Protein, ur: NEGATIVE mg/dL
Specific Gravity, Urine: 1.017 (ref 1.005–1.030)
pH: 5 (ref 5.0–8.0)

## 2022-05-22 LAB — COMPREHENSIVE METABOLIC PANEL
ALT: 100 U/L — ABNORMAL HIGH (ref 0–44)
AST: 77 U/L — ABNORMAL HIGH (ref 15–41)
Albumin: 3.1 g/dL — ABNORMAL LOW (ref 3.5–5.0)
Alkaline Phosphatase: 75 U/L (ref 38–126)
Anion gap: 9 (ref 5–15)
BUN: 11 mg/dL (ref 8–23)
CO2: 23 mmol/L (ref 22–32)
Calcium: 8.8 mg/dL — ABNORMAL LOW (ref 8.9–10.3)
Chloride: 108 mmol/L (ref 98–111)
Creatinine, Ser: 1.21 mg/dL (ref 0.61–1.24)
GFR, Estimated: >60 mL/min (ref >60)
Glucose, Bld: 121 mg/dL — ABNORMAL HIGH (ref 70–99)
Potassium: 3.4 mmol/L — ABNORMAL LOW (ref 3.5–5.1)
Sodium: 140 mmol/L (ref 135–145)
Total Bilirubin: 0.5 mg/dL (ref 0.3–1.2)
Total Protein: 6.6 g/dL (ref 6.5–8.1)

## 2022-05-22 LAB — D-DIMER, QUANTITATIVE: D-Dimer, Quant: 0.31 ug/mL-FEU (ref 0.00–0.50)

## 2022-05-22 LAB — TROPONIN I (HIGH SENSITIVITY)
Troponin I (High Sensitivity): 13 ng/L (ref <18)
Troponin I (High Sensitivity): 13 ng/L (ref <18)

## 2022-05-22 LAB — RESP PANEL BY RT-PCR (FLU A&B, COVID) ARPGX2
Influenza A by PCR: NEGATIVE
Influenza B by PCR: NEGATIVE
SARS Coronavirus 2 by RT PCR: NEGATIVE

## 2022-05-22 LAB — LIPASE, BLOOD: Lipase: 35 U/L (ref 11–51)

## 2022-05-22 MED ORDER — ACETAMINOPHEN 500 MG PO TABS
1000.0000 mg | ORAL_TABLET | Freq: Once | ORAL | Status: AC
  Administered 2022-05-22: 1000 mg via ORAL
  Filled 2022-05-22: qty 2

## 2022-05-22 MED ORDER — ALBUTEROL SULFATE HFA 108 (90 BASE) MCG/ACT IN AERS: 1.0000 | INHALATION_SPRAY | Freq: Four times a day (QID) | RESPIRATORY_TRACT | 0 refills | Status: DC | PRN

## 2022-05-22 MED ORDER — IPRATROPIUM-ALBUTEROL 0.5-2.5 (3) MG/3ML IN SOLN
3.0000 mL | Freq: Once | RESPIRATORY_TRACT | Status: AC
  Administered 2022-05-22: 3 mL via RESPIRATORY_TRACT
  Filled 2022-05-22: qty 3

## 2022-05-22 MED ORDER — IOHEXOL 300 MG/ML  SOLN
75.0000 mL | Freq: Once | INTRAMUSCULAR | Status: AC | PRN
Start: 2022-05-22 — End: 2022-05-22
  Administered 2022-05-22: 75 mL via INTRAVENOUS

## 2022-05-22 MED ORDER — DEXAMETHASONE SODIUM PHOSPHATE 10 MG/ML IJ SOLN
10.0000 mg | Freq: Once | INTRAMUSCULAR | Status: AC
  Administered 2022-05-22: 10 mg via INTRAVENOUS
  Filled 2022-05-22: qty 1

## 2022-05-22 MED ORDER — DOXYCYCLINE HYCLATE 100 MG PO CAPS: 100.0000 mg | ORAL_CAPSULE | Freq: Two times a day (BID) | ORAL | 0 refills | Status: DC

## 2022-05-22 MED ORDER — PREDNISONE 10 MG PO TABS: 40.0000 mg | ORAL_TABLET | Freq: Every day | ORAL | 0 refills | Status: DC

## 2022-05-22 MED ORDER — ALUM & MAG HYDROXIDE-SIMETH 200-200-20 MG/5ML PO SUSP
30.0000 mL | Freq: Once | ORAL | Status: AC
  Administered 2022-05-22: 30 mL via ORAL
  Filled 2022-05-22: qty 30

## 2022-05-22 MED ORDER — PANTOPRAZOLE SODIUM 20 MG PO TBEC: 20.0000 mg | DELAYED_RELEASE_TABLET | Freq: Every day | ORAL | 0 refills | Status: DC

## 2022-05-22 MED ORDER — PANTOPRAZOLE SODIUM 40 MG IV SOLR
40.0000 mg | Freq: Once | INTRAVENOUS | Status: AC
  Administered 2022-05-22: 40 mg via INTRAVENOUS
  Filled 2022-05-22: qty 10

## 2022-05-22 MED ORDER — POTASSIUM CHLORIDE CRYS ER 20 MEQ PO TBCR
40.0000 meq | EXTENDED_RELEASE_TABLET | Freq: Once | ORAL | Status: AC
  Administered 2022-05-22: 40 meq via ORAL
  Filled 2022-05-22: qty 2

## 2022-05-22 NOTE — ED Notes (Signed)
AVS with prescriptions provided to and discussed with patient and family member at bedside. Pt verbalizes understanding of discharge instructions and denies any questions or concerns at this time. Pt has ride home. Pt ambulated out of department independently with steady gait.  

## 2022-05-22 NOTE — ED Provider Notes (Signed)
Physical Exam  BP (!) 143/85   Pulse 72   Temp 99.1 F (37.3 C) (Oral)   Resp (!) 21   SpO2 95%   Physical Exam  Procedures  Procedures  ED Course / MDM   Clinical Course as of 05/22/22 1704  Sat May 22, 2022  1554 D/c w/ tx for bronchitis and ppi pending CT abd/pelv F/u on ct results [CR]    Clinical Course User Index [CR] Wilnette Kales, PA   Medical Decision Making Risk Prescription drug management.   Patient care handed off from Lynnell Catalan, PA-C at shift change.  Treatment plan was to follow-up on CT abdomen pelvis results as well as evaluate laboratory studies still pending with expected discharge with treatment of acute chronic bronchitis as well as gastritis.  See prior note for more details.  In short, patient had multiple complaints upon visiting emergency department today.  He described epigastric abdominal pain with radiation to bilateral flanks as well as shortness of breath and increasing cough that all began yesterday.  He has a history of smoking cigarettes, CHF, atrial fibrillation, hypertension, diabetes, OSA, CKD 3, nonalcoholic fatty liver disease.  Denies chest pain, hemoptysis, nausea, vomiting, urinary symptoms, hematochezia/melena, syncope, presyncopal symptoms.  Patient denies history of COPD or current inhaler use.  Laboratory studies: UA without significant abnormality.  Respiratory viral panel negative.  Lipase within normal limits.  Delta negative troponin with no new ischemic changes on EKG.  No leukocytosis.  No evidence of anemia.  Potassium slightly decreased with level of 3.4 which is supplemented orally with 40 mill colons of potassium.  Otherwise electrolytes within normal range.  GFR greater than 60 indicating no acute kidney injury.  Patient has transaminitis with AST of 77, ALT of 100; patient has baseline elevation of liver enzymes which is in normal range.  D-dimer negative so no evidence to pursue pulmonary embolism studies.  Imaging  studies:  CT abdomen pelvis: No evidence of intestinal obstruction, pneumoperitoneum, hydronephrosis, appendicitis.  Patient has lumbar spondylolysis, umbilical and right inguinal hernias containing fat, fatty liver. Chest x-ray: No active cardiopulmonary disease.  Doubt CHF exacerbation given no clinical evidence of fluid overload with negative imaging.  EKG: Sinus rhythm with borderline QT prolongation of QTc of 485.  No significant changes from prior EKG performed on 12/31/2021.  Acute bronchitis Vitals signs significant for mild hypertension with blood pressure 143/85.  Recommend close follow-up with PCP regarding elevation of blood pressure.. Otherwise within normal range and stable throughout visit. Laboratory/imaging studies significant for: See above Reevaluation patient after administration of medications while emergency department showed significant improvement in patient's symptoms.  He is no longer complaining of chest pain or epigastric pain.  He is requesting at home medications similar to what was given in the emergency department.  Results were discussed with patient as well as daughter who reassured by overall negative nature.  Patient's symptoms likely secondary to acute bronchitis given he has not been diagnosed with COPD in the past.  He was ambulated with pulse oximetry and showed no desaturation.  He is still mildly wheezing on auscultatory exam but patient states breathing is significantly better after breathing treatments and steroid.  Patient to be discharged home with DuoNeb, PPI, prednisone, azithromycin.  Treatment plan was discussed with patient and daughter and they acknowledged understanding are agreeable to said plan.  Smoking cessation was discussed at length with patient and he was resistant at this time.  Close follow-up with PCP recommended in 2 to  3 days for reevaluation of patient's symptoms.   Worrisome signs and symptoms were discussed with the patient and the  patient knowledge understanding to return to emergency department notice.  Patient was stable upon discharge.    Wilnette Kales, [place] 05/22/22 1731    Fredia Sorrow, MD 06/03/22 Carollee Massed

## 2022-05-22 NOTE — ED Provider Triage Note (Signed)
Emergency Medicine Provider Triage Evaluation Note  Christopher Wilkins. , a 66 y.o. male  was evaluated in triage.  Pt complains of shortness of breath and epigastric abdominal pain.  Patient states that symptoms began yesterday morning.  States that he has had a new cough and feels short of breath with walking up the stairs.  He states that also since yesterday has had epigastric abdominal pain that radiates into his abdomen.  He has had associated hot and cold chills.  He denies nausea or vomiting or urinary symptoms, denies chest pain, palpitations.  Review of Systems  Positive:  Negative:   Physical Exam  BP (!) 145/83 (BP Location: Right Arm)   Pulse 88   Temp 99.5 F (37.5 C) (Oral)   Resp (!) 24   SpO2 97%  Gen:   Awake, no distress   Resp:  Normal effort, productive cough, lungs clear MSK:   Moves extremities without difficulty  Other:  Abdomen is protuberant and generally "sore" when palpating  Medical Decision Making  Medically screening exam initiated at 9:24 AM.  Appropriate orders placed.  Christopher Mikes. was informed that the remainder of the evaluation will be completed by another provider, this initial triage assessment does not replace that evaluation, and the importance of remaining in the ED until their evaluation is complete.     [name], PA-C 05/22/22 [phone]

## 2022-05-22 NOTE — ED Provider Notes (Signed)
I provided a substantive portion of the care of this patient.  I personally performed the entirety of the medical decision making for this encounter. {Remember to document shared critical care using "edcritical" dot phrase:1} EKG Interpretation  Date/Time:  Saturday May 22 2022 13:16:24 EDT Ventricular Rate:  71 PR Interval:  196 QRS Duration: 104 QT Interval:  446 QTC Calculation: 485 R Axis:   51 Text Interpretation: Sinus rhythm Borderline prolonged QT interval no sig change Confirmed by Charlesetta Shanks [phone]) on 05/22/2022 3:40:15 PM

## 2022-05-22 NOTE — ED Notes (Signed)
Pt returned from CT °

## 2022-05-22 NOTE — Discharge Instructions (Addendum)
Note your work-up today was overall consistent with acute bronchitis.  We will treat this with albuterol inhaler to use as needed; this is a rescue inhaler.  Also prescribed a daily steroid to take for the next 5 days as well as antibiotic for the next 6 days.  I will also prescribe Protonix to take for your upper belly pain daily.  Please follow-up with your primary care provider in the next 2 to 3 days for reevaluation of your symptoms.  Please not hesitate to return to emergency department the worrisome signs and symptoms we discussed become apparent.

## 2022-05-22 NOTE — ED Provider Notes (Signed)
[facility] EMERGENCY DEPARTMENT Provider Note   CSN: [id number] Arrival date & time: 05/22/22  7482     History  Chief Complaint  Patient presents with   Abdominal Pain   Chills   Fever    Christopher Wilkins. is a 66 y.o. male. With past medical history of CHF, atrial fibrillation, hypertension, diabetes, sleep apnea, CKD III, NAFLD who presents to the emergency department with abdominal pain.  States symptoms began yesterday. Describes having epigastric abdominal pain that radiates into his abdomen and intermittently to his flanks. He has also had cough and shortness of breath starting yesterday and coughing exacerbates the abdominal pain. Has had associated subjective fever and chills and dizziness. He denies nausea, vomiting, diarrhea, urinary symptoms. He denies chest pain, palpitations or syncope.    Abdominal Pain Associated symptoms: chills, cough, fever and shortness of breath   Associated symptoms: no chest pain, no diarrhea, no dysuria, no nausea and no vomiting   Fever Associated symptoms: chills, cough and headaches   Associated symptoms: no chest pain, no diarrhea, no dysuria, no nausea and no vomiting        Home Medications Prior to Admission medications   Medication Sig Start Date End Date Taking? Authorizing Provider  albuterol (VENTOLIN HFA) 108 (90 Base) MCG/ACT inhaler SMARTSIG:1 Puff(s) Via Inhaler Every 4 Hours PRN 08/25/21   [provider]  amiodarone (PACERONE) 200 MG tablet TAKE 1 TABLET (200 MG) BY ORAL ROUTE ONCE DAILY 01/06/21   Arthur Holms, NP  apixaban (ELIQUIS) 5 MG TABS tablet TAKE 1 TABLET (5 MG TOTAL) BY MOUTH 2 (TWO) TIMES DAILY. 11/12/20   Carlisle Cater, PA-C  atorvastatin (LIPITOR) 80 MG tablet SMARTSIG:1 Tablet(s) By Mouth Every Evening 02/16/22   [provider]  blood glucose meter kit and supplies KIT Dispense based on patient and insurance preference. Use up to two times daily as directed. 03/01/22    Dennard Nip D, MD  Blood Pressure Monitor DEVI Please provide patient with insurance approved blood pressure monitor I10.0 05/10/22   Jearld Lesch A, DO  Cholecalciferol (VITAMIN D3 PO) Take 2,000 Units by mouth daily.    [provider]  dapagliflozin propanediol (FARXIGA) 10 MG TABS tablet Take 10 mg by mouth daily.    [provider]  ENTRESTO 49-51 MG Take 1 tablet by mouth 2 (two) times daily. 02/06/21   [provider]  furosemide (LASIX) 80 MG tablet Take 1 tablet (80 mg total) by mouth daily. 01/29/22 05/05/22  Donato Heinz, MD  metFORMIN (GLUMETZA) 500 MG (MOD) 24 hr tablet Take 500 mg by mouth daily with breakfast.    [provider]  metoprolol succinate (TOPROL-XL) 25 MG 24 hr tablet TAKE 1 TABLET (25 MG TOTAL) BY MOUTH DAILY. 11/12/20   Carlisle Cater, PA-C  Misc. Devices (GNP DIGITAL WEIGHT SCALE) MISC 1 Device by Does not apply route daily. Please provide patient with insurance approved scale ICD110 I50.2 10/29/20   [name], NP  Semaglutide,0.25 or 0.5MG/DOS, (OZEMPIC, 0.25 OR 0.5 MG/DOSE,) 2 MG/3ML SOPN Inject into the skin weekly 05/10/22   Jearld Lesch A, DO  Vitamin D, Ergocalciferol, (DRISDOL) 1.25 MG (50000 UNIT) CAPS capsule Take 1 capsule (50,000 Units total) by mouth every 7 (seven) days. 05/10/22   Jearld Lesch A, DO      Allergies    Codeine, Lisinopril, and Shellfish allergy    Review of Systems   Review of Systems  Constitutional:  Positive for chills and fever.  Respiratory:  Positive for cough and shortness of breath.   Cardiovascular:  Negative for chest pain and palpitations.  Gastrointestinal:  Positive for abdominal pain. Negative for diarrhea, nausea and vomiting.  Genitourinary:  Negative for dysuria.  Neurological:  Positive for headaches. Negative for syncope.  All other systems reviewed and are negative.   Physical Exam Updated Vital Signs BP 118/83   Pulse 76   Temp 99.5 F (37.5 C) (Oral)    Resp 18   SpO2 98%  Physical Exam Vitals and nursing note reviewed.  Constitutional:      General: He is not in acute distress.    Appearance: Normal appearance. He is obese. He is ill-appearing. He is not toxic-appearing.  HENT:     Head: Normocephalic and atraumatic.     Mouth/Throat:     Mouth: Mucous membranes are moist.     Pharynx: Oropharynx is clear.  Eyes:     General: No scleral icterus.    Extraocular Movements: Extraocular movements intact.     Pupils: Pupils are equal, round, and reactive to light.  Cardiovascular:     Rate and Rhythm: Normal rate and regular rhythm.     Heart sounds: Normal heart sounds. No murmur heard. Pulmonary:     Effort: Tachypnea present. No respiratory distress.     Breath sounds: Decreased air movement present. Examination of the right-upper field reveals wheezing. Examination of the left-upper field reveals wheezing. Wheezing present.  Abdominal:     General: Abdomen is protuberant. Bowel sounds are normal. There is no distension.     Palpations: Abdomen is soft.     Tenderness: There is no abdominal tenderness.  Skin:    General: Skin is warm and dry.     Capillary Refill: Capillary refill takes less than 2 seconds.  Neurological:     General: No focal deficit present.     Mental Status: He is alert and oriented to person, place, and time.  Psychiatric:        Mood and Affect: Mood normal.        Behavior: Behavior normal.    ED Results / Procedures / Treatments   Labs (all labs ordered are listed, but only abnormal results are displayed) Labs Reviewed  COMPREHENSIVE METABOLIC PANEL - Abnormal; Notable for the following components:      Result Value   Potassium 3.4 (*)    Glucose, Bld 121 (*)    Calcium 8.8 (*)    Albumin 3.1 (*)    AST 77 (*)    ALT 100 (*)    All other components within normal limits  CBC WITH DIFFERENTIAL/PLATELET - Abnormal; Notable for the following components:   RDW 16.2 (*)    Neutro Abs 1.5 (*)     All other components within normal limits  URINALYSIS, ROUTINE W REFLEX MICROSCOPIC - Abnormal; Notable for the following components:   Glucose, UA >=500 (*)    All other components within normal limits  RESP PANEL BY RT-PCR (FLU A&B, COVID) ARPGX2  LIPASE, BLOOD  TROPONIN I (HIGH SENSITIVITY)  TROPONIN I (HIGH SENSITIVITY)   EKG None  Radiology DG Chest 2 View  Result Date: 05/22/2022 CLINICAL DATA:  cough, sob EXAM: CHEST - 2 VIEW COMPARISON:  May 10, 2022 FINDINGS: The heart size and mediastinal contours are stable. Both lungs are clear and stable as well. The visualized skeletal structures are unremarkable. IMPRESSION: No active cardiopulmonary disease. Electronically Signed   By: Frazier Richards M.D.   On:  05/22/2022 10:02    Procedures Procedures   Medications Ordered in ED Medications  ipratropium-albuterol (DUONEB) 0.5-2.5 (3) MG/3ML nebulizer solution 3 mL (3 mLs Nebulization Given 05/22/22 1418)  dexamethasone (DECADRON) injection 10 mg (10 mg Intravenous Given 05/22/22 1418)  acetaminophen (TYLENOL) tablet 1,000 mg (1,000 mg Oral Given 05/22/22 1418)    ED Course/ Medical Decision Making/ A&P                           Medical Decision Making Amount and/or Complexity of Data Reviewed Labs: ordered. Radiology: ordered.  Risk OTC drugs. Prescription drug management.  Care of patient being handed off from [place], [place]. Patient is pending CT abdomen pelvis with contrast. Briefly, 66 year old male who presents emergency department with cough, shortness of breath and abdominal pain. Troponin x2 negative, EKG without ischemia or infarction, doubt ACS. Chest x-ray without pneumonia, COVID and flu were negative.  He does have a mildly productive cough, on reassessment he was wheezing and received 2 DuoNebs which improved his symptoms.  Doubt pneumonia at this time.  He may have a bronchitis. Considered but doubt PE.  He is slightly tachypneic and wheezing which improved with  DuoNebs.  Not hypoxic.  Ambulated him and he maintained sats between 93 to 96%.  His D-dimer is also negative.  Will not pursue CTA PE study at this time. Chest x-ray is also without pleural effusion, pneumothorax. Regarding his abdominal pain, his labs are unremarkable.  UA without UTI.  CBC without leukocytosis.  CMP without new transaminitis.  He has known NAFLD with elevated LFTs which are stable.  There is no AKI or other electrolyte derangement.  I discussed this case with my attending physician who cosigned this note including patient's presenting symptoms, physical exam, and planned diagnostics and interventions. Attending physician stated agreement with plan or made changes to plan which were implemented.  Attending physician assessed patient at bedside.   At handoff, he is pending CT abdomen pelvis with contrast as I do not have an etiology for his symptoms.  If this is negative he can be discharged with bronchitis treatment as well as a PPI.  He was given a GI cocktail which did mildly improve his symptoms.  CT abdomen pelvis does show some etiology of abdominal pain, treated appropriately.  Final Clinical Impression(s) / ED Diagnoses Final diagnoses:  None    Rx / DC Orders ED Discharge Orders     None         [name], PA-C 05/22/22 1553    Charlesetta Shanks, MD 05/25/22 1553

## 2022-05-22 NOTE — ED Notes (Signed)
Pt o2 remained between 93-96% on room air while ambulating and HR remained around 82.

## 2022-05-22 NOTE — ED Triage Notes (Signed)
Pt with epigastric abdominal pain radiating to the back, almost flank pain since yesterday. Per patient he has been dizzy feeling like the room is dizzy. Also reports fevers, chills since yesterday. Denies chest pain. Does feel SHOB since yesterday. AOX4.

## 2022-06-07 ENCOUNTER — Ambulatory Visit (INDEPENDENT_AMBULATORY_CARE_PROVIDER_SITE_OTHER): Payer: Medicare Other | Admitting: Bariatrics

## 2022-06-07 ENCOUNTER — Encounter (INDEPENDENT_AMBULATORY_CARE_PROVIDER_SITE_OTHER): Payer: Self-pay | Admitting: Bariatrics

## 2022-06-07 VITALS — BP 113/74 | HR 65 | Temp 98.0°F | Ht 66.0 in

## 2022-06-07 DIAGNOSIS — E559 Vitamin D deficiency, unspecified: Secondary | ICD-10-CM

## 2022-06-07 DIAGNOSIS — E6609 Other obesity due to excess calories: Secondary | ICD-10-CM

## 2022-06-07 DIAGNOSIS — I509 Heart failure, unspecified: Secondary | ICD-10-CM

## 2022-06-07 DIAGNOSIS — Z6841 Body Mass Index (BMI) 40.0 and over, adult: Secondary | ICD-10-CM

## 2022-06-07 DIAGNOSIS — Z7985 Long-term (current) use of injectable non-insulin antidiabetic drugs: Secondary | ICD-10-CM

## 2022-06-07 DIAGNOSIS — E1169 Type 2 diabetes mellitus with other specified complication: Secondary | ICD-10-CM

## 2022-06-07 DIAGNOSIS — E669 Obesity, unspecified: Secondary | ICD-10-CM | POA: Diagnosis not present

## 2022-06-07 DIAGNOSIS — Z7984 Long term (current) use of oral hypoglycemic drugs: Secondary | ICD-10-CM

## 2022-06-07 MED ORDER — VITAMIN D (ERGOCALCIFEROL) 1.25 MG (50000 UNIT) PO CAPS: 50000.0000 [IU] | ORAL_CAPSULE | ORAL | 0 refills | Status: DC

## 2022-06-07 MED ORDER — ALBUTEROL SULFATE HFA 108 (90 BASE) MCG/ACT IN AERS: 1.0000 | INHALATION_SPRAY | Freq: Four times a day (QID) | RESPIRATORY_TRACT | 0 refills | Status: DC | PRN

## 2022-06-10 ENCOUNTER — Encounter (INDEPENDENT_AMBULATORY_CARE_PROVIDER_SITE_OTHER): Payer: Self-pay | Admitting: Bariatrics

## 2022-06-10 NOTE — Progress Notes (Signed)
Chief Complaint:   OBESITY Christopher Wilkins is here to discuss his progress with his obesity treatment plan along with follow-up of his obesity related diagnoses. Christopher Wilkins is on the Category 3 Plan and states he is following his eating plan approximately 0% of the time. Christopher Wilkins states he is walking for 30 minutes 7 days/week.  Today's visit was #: 8 Starting weight: 248 lbs Starting date: 12/31/2021 Today's weight: 250 lbs Today's date: 06/07/22 Total lbs lost to date: 0 Total lbs lost since last in-office visit: +2  Interim History: He is up 2 pounds since his last visit.  Subjective:   1. Type 2 diabetes mellitus with other specified complication, unspecified whether long term insulin use (Christopher Wilkins) Taking Farxiga, metformin, Ozempic.  2. Vitamin D deficiency Energy is good.  Taking vitamin D.  3. Congestive heart failure, unspecified HF chronicity, unspecified heart failure type (Christopher Wilkins) Possible exercise-induced asthma.  Assessment/Plan:   1. Type 2 diabetes mellitus with other specified complication, unspecified whether long term insulin use (Christopher Wilkins) Continue medications.  2. Vitamin D deficiency Refill - Vitamin D, Ergocalciferol, (DRISDOL) 1.25 MG (50000 UNIT) CAPS capsule; Take 1 capsule (50,000 Units total) by mouth every 7 (seven) days.  Dispense: 4 capsule; Refill: 0  3. Congestive heart failure, unspecified HF chronicity, unspecified heart failure type (Christopher Wilkins) whether long term insulin use (Christopher Wilkins) Refill - albuterol (VENTOLIN HFA) 108 (90 Base) MCG/ACT inhaler; Inhale 1-2 puffs into the lungs every 6 (six) hours as needed for wheezing or shortness of breath.  Dispense: 18 g; Refill: 0  4. Obesity, current BMI 40.5 1.  Meal planning 2.  Intentional eating.  Christopher Wilkins is currently in the action stage of change. As such, his goal is to continue with weight loss efforts. He has agreed to the Category 3 Plan.   Exercise goals: as is  Behavioral modification strategies: increasing lean  protein intake, decreasing simple carbohydrates, increasing vegetables, increasing water intake, decreasing eating out, no skipping meals, meal planning and cooking strategies, keeping healthy foods in the home, and planning for success.  Christopher Wilkins has agreed to follow-up with our clinic in 3-4 weeks with Dr. Valetta Close or Valetta Fuller. He was informed of the importance of frequent follow-up visits to maximize his success with intensive lifestyle modifications for his multiple health conditions.    Objective:   Blood pressure 113/74, pulse 65, temperature 98 F (36.7 C), height '5\' 6"'$  (1.676 m), SpO2 94 %. Body mass index is 40.67 kg/m.  General: Cooperative, alert, well developed, in no acute distress. HEENT: Conjunctivae and lids unremarkable. Cardiovascular: Regular rhythm.  Lungs: Normal work of breathing. Neurologic: No focal deficits.   Lab Results  Component Value Date   CREATININE 1.21 05/22/2022   BUN 11 05/22/2022   NA 140 05/22/2022   K 3.4 (L) 05/22/2022   CL 108 05/22/2022   CO2 23 05/22/2022   Lab Results  Component Value Date   ALT 100 (H) 05/22/2022   AST 77 (H) 05/22/2022   ALKPHOS 75 05/22/2022   BILITOT 0.5 05/22/2022   Lab Results  Component Value Date   HGBA1C 6.5 (H) 12/31/2021   HGBA1C 5.9 (H) 08/17/2020   HGBA1C 5.8 (H) 06/19/2020   Lab Results  Component Value Date   INSULIN 71.9 (H) 12/31/2021   Lab Results  Component Value Date   TSH 0.828 05/05/2022   Lab Results  Component Value Date   CHOL 186 12/31/2021   HDL 46 12/31/2021   LDLCALC 113 (H) 12/31/2021   TRIG  155 (H) 12/31/2021   CHOLHDL 3.2 06/19/2020   Lab Results  Component Value Date   VD25OH 27.3 (L) 12/31/2021   Lab Results  Component Value Date   WBC 4.1 05/22/2022   HGB 15.5 05/22/2022   HCT 48.1 05/22/2022   MCV 85.6 05/22/2022   PLT 193 05/22/2022   Lab Results  Component Value Date   FERRITIN 136.6 05/10/2022     Attestation Statements:   Reviewed by clinician on day  of visit: allergies, medications, problem list, medical history, surgical history, family history, social history, and previous encounter notes.  I, Dawn Whitmire, FNP-C, am acting as transcriptionist for Dr. Jearld Lesch.  I have reviewed the above documentation for accuracy and completeness, and I agree with the above. Jearld Lesch, DO

## 2022-06-17 ENCOUNTER — Encounter: Payer: Self-pay | Admitting: *Deleted

## 2022-06-21 ENCOUNTER — Telehealth (HOSPITAL_COMMUNITY): Payer: Self-pay | Admitting: *Deleted

## 2022-06-21 NOTE — Telephone Encounter (Signed)
Reaching out to patient to offer assistance regarding upcoming cardiac imaging study; pt verbalizes understanding of appt date/time, parking situation and where to check in, and verified current allergies; name and call back number provided for further questions should they arise  Christopher Clement RN Navigator Cardiac Imaging [facility] [phone] office [phone] cell  Patient aware to arrive at 7:30am and take his daily medications at 6am.

## 2022-06-22 ENCOUNTER — Ambulatory Visit (HOSPITAL_COMMUNITY)
Admission: RE | Admit: 2022-06-22 | Discharge: 2022-06-22 | Disposition: A | Discharge status: Home / Self Care | Payer: Medicare Other | Source: Ambulatory Visit | Attending: Cardiology | Admitting: Cardiology

## 2022-06-22 DIAGNOSIS — I4819 Other persistent atrial fibrillation: Secondary | ICD-10-CM

## 2022-06-22 MED ORDER — IOHEXOL 350 MG/ML SOLN
100.0000 mL | Freq: Once | INTRAVENOUS | Status: AC | PRN
  Administered 2022-06-22: 100 mL via INTRAVENOUS

## 2022-06-22 NOTE — Anesthesia Preprocedure Evaluation (Signed)
Anesthesia Evaluation  Patient identified by MRN, date of birth, ID band Patient awake    Reviewed: Allergy & Precautions, NPO status , Patient's Chart, lab work & pertinent test results, reviewed documented beta blocker date and time   Airway Mallampati: III  TM Distance: >3 FB Neck ROM: Full    Dental no notable dental hx. (+) Edentulous Upper, Partial Lower, Dental Advisory Given, Missing, Poor Dentition,    Pulmonary Current Smoker and Patient abstained from smoking., former smoker,  Quit smoking 05/2020   Pulmonary exam normal breath sounds clear to auscultation       Cardiovascular hypertension, Pt. on medications and Pt. on home beta blockers +CHF (LVEF 20%) and + Orthopnea  Normal cardiovascular exam+ dysrhythmias (eliquis) Atrial Fibrillation  Rhythm:Regular Rate:Normal  ECHO 22 1. E/E' may not be accurate due to MAC 09326. Left ventricular ejection  fraction, by estimation, is 50 to 55%. The left ventricle has low normal  function. The left ventricle has no regional wall motion abnormalities.  The left ventricular internal cavity  size was mildly dilated. There is mild left ventricular hypertrophy. Left  ventricular diastolic parameters are consistent with Grade I diastolic  dysfunction (impaired relaxation).  2. Right ventricular systolic function is normal. The right ventricular  size is normal.  3. Degenerative with calcified subchordal structures . The mitral valve  is abnormal. Trivial mitral valve regurgitation. No evidence of mitral  stenosis. Moderate mitral annular calcification.  4. Likely very mild AS peak velocity 14msec gardients / AVA not done .  The aortic valve is calcified. There is moderate calcification of the  aortic valve. Aortic valve regurgitation is not visualized. Mild to  moderate aortic valve  sclerosis/calcification is present, without any evidence of aortic  stenosis.      Neuro/Psych negative neurological ROS  negative psych ROS   GI/Hepatic negative GI ROS, Neg liver ROS,   Endo/Other  diabetesMorbid obesityObesity BMI 32  Renal/GU Renal Insufficiency and CRFRenal diseaseCr 1.35  negative genitourinary   Musculoskeletal negative musculoskeletal ROS (+)   Abdominal   Peds  Hematology negative hematology ROS (+)   Anesthesia Other Findings Cath 11/22 1. Normal coronary arteries 2. Severe NICM EF 15-20% (suspect AF related) 3. Well compensated hemodynamics on milrinone 4. 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   Reproductive/Obstetrics negative OB ROS                           Anesthesia Physical  Anesthesia Plan  ASA: 4  Anesthesia Plan: General   Post-op Pain Management: Minimal or no pain anticipated   Induction: Intravenous  PONV Risk Score and Plan: 2 and Treatment may vary due to age or medical condition and Ondansetron  Airway Management Planned: Oral ETT  Additional Equipment: None  Intra-op Plan:   Post-operative Plan: Extubation in OR  Informed Consent: I have reviewed the patients History and Physical, chart, labs and discussed the procedure including the risks, benefits and alternatives for the proposed anesthesia with the patient or authorized representative who has indicated his/her understanding and acceptance.       Plan Discussed with: CRNA and Anesthesiologist  Anesthesia Plan Comments: ( Art line as needed.  )        Anesthesia Quick Evaluation

## 2022-06-22 NOTE — Pre-Procedure Instructions (Signed)
Instructed patient on the following items: Arrival time 0600 Nothing to eat or drink after midnight No meds AM of procedure Responsible person to drive you home and stay with you for 24 hrs  He is going to work on finding someone to stay with him after procedure  Have you missed any doses of anti-coagulant Eliquis- hasn't missed any doses

## 2022-06-23 ENCOUNTER — Encounter (HOSPITAL_COMMUNITY)
Admission: RE | Disposition: A | Discharge status: Home / Self Care | Payer: Medicare Other | Source: Ambulatory Visit | Attending: Cardiology

## 2022-06-23 ENCOUNTER — Ambulatory Visit (HOSPITAL_COMMUNITY)
Admission: RE | Admit: 2022-06-23 | Discharge: 2022-06-23 | Disposition: A | Discharge status: Home / Self Care | Payer: Medicare Other | Source: Ambulatory Visit | Attending: Cardiology | Admitting: Cardiology

## 2022-06-23 ENCOUNTER — Ambulatory Visit (HOSPITAL_BASED_OUTPATIENT_CLINIC_OR_DEPARTMENT_OTHER): Payer: Medicare Other | Admitting: Anesthesiology

## 2022-06-23 ENCOUNTER — Ambulatory Visit (HOSPITAL_COMMUNITY): Payer: Medicare Other | Admitting: Anesthesiology

## 2022-06-23 ENCOUNTER — Other Ambulatory Visit: Payer: Self-pay

## 2022-06-23 ENCOUNTER — Encounter (HOSPITAL_COMMUNITY): Payer: Self-pay | Admitting: Cardiology

## 2022-06-23 DIAGNOSIS — Z7985 Long-term (current) use of injectable non-insulin antidiabetic drugs: Secondary | ICD-10-CM

## 2022-06-23 DIAGNOSIS — N189 Chronic kidney disease, unspecified: Secondary | ICD-10-CM

## 2022-06-23 DIAGNOSIS — I5042 Chronic combined systolic (congestive) and diastolic (congestive) heart failure: Secondary | ICD-10-CM | POA: Diagnosis not present

## 2022-06-23 DIAGNOSIS — I428 Other cardiomyopathies: Secondary | ICD-10-CM | POA: Diagnosis not present

## 2022-06-23 DIAGNOSIS — F17210 Nicotine dependence, cigarettes, uncomplicated: Secondary | ICD-10-CM | POA: Diagnosis not present

## 2022-06-23 DIAGNOSIS — Z6836 Body mass index (BMI) 36.0-36.9, adult: Secondary | ICD-10-CM | POA: Diagnosis not present

## 2022-06-23 DIAGNOSIS — N179 Acute kidney failure, unspecified: Secondary | ICD-10-CM | POA: Insufficient documentation

## 2022-06-23 DIAGNOSIS — E119 Type 2 diabetes mellitus without complications: Secondary | ICD-10-CM | POA: Diagnosis not present

## 2022-06-23 DIAGNOSIS — I4891 Unspecified atrial fibrillation: Secondary | ICD-10-CM | POA: Diagnosis not present

## 2022-06-23 DIAGNOSIS — I4819 Other persistent atrial fibrillation: Secondary | ICD-10-CM | POA: Insufficient documentation

## 2022-06-23 DIAGNOSIS — Z87891 Personal history of nicotine dependence: Secondary | ICD-10-CM

## 2022-06-23 DIAGNOSIS — I130 Hypertensive heart and chronic kidney disease with heart failure and stage 1 through stage 4 chronic kidney disease, or unspecified chronic kidney disease: Secondary | ICD-10-CM

## 2022-06-23 DIAGNOSIS — I110 Hypertensive heart disease with heart failure: Secondary | ICD-10-CM | POA: Diagnosis not present

## 2022-06-23 DIAGNOSIS — I509 Heart failure, unspecified: Secondary | ICD-10-CM

## 2022-06-23 DIAGNOSIS — E1122 Type 2 diabetes mellitus with diabetic chronic kidney disease: Secondary | ICD-10-CM | POA: Diagnosis not present

## 2022-06-23 DIAGNOSIS — Z7984 Long term (current) use of oral hypoglycemic drugs: Secondary | ICD-10-CM

## 2022-06-23 HISTORY — PX: ATRIAL FIBRILLATION ABLATION: EP1191

## 2022-06-23 LAB — BASIC METABOLIC PANEL
Anion gap: 6 (ref 5–15)
BUN: 17 mg/dL (ref 8–23)
CO2: 26 mmol/L (ref 22–32)
Calcium: 8.6 mg/dL — ABNORMAL LOW (ref 8.9–10.3)
Chloride: 110 mmol/L (ref 98–111)
Creatinine, Ser: 1.37 mg/dL — ABNORMAL HIGH (ref 0.61–1.24)
GFR, Estimated: 57 mL/min — ABNORMAL LOW (ref >60)
Glucose, Bld: 109 mg/dL — ABNORMAL HIGH (ref 70–99)
Potassium: 3.7 mmol/L (ref 3.5–5.1)
Sodium: 142 mmol/L (ref 135–145)

## 2022-06-23 LAB — CBC
HCT: 47.1 % (ref 39.0–52.0)
Hemoglobin: 15.5 g/dL (ref 13.0–17.0)
MCH: 27.7 pg (ref 26.0–34.0)
MCHC: 32.9 g/dL (ref 30.0–36.0)
MCV: 84.3 fL (ref 80.0–100.0)
Platelets: 255 10*3/uL (ref 150–400)
RBC: 5.59 MIL/uL (ref 4.22–5.81)
RDW: 17.2 % — ABNORMAL HIGH (ref 11.5–15.5)
WBC: 5.8 10*3/uL (ref 4.0–10.5)
nRBC: 0 % (ref 0.0–0.2)

## 2022-06-23 LAB — POCT ACTIVATED CLOTTING TIME
Activated Clotting Time: 239 seconds
Activated Clotting Time: 299 seconds
Activated Clotting Time: 305 seconds
Activated Clotting Time: 299 seconds
Activated Clotting Time: 305 seconds
Activated Clotting Time: 305 seconds
Activated Clotting Time: 256 seconds
Activated Clotting Time: 305 seconds

## 2022-06-23 LAB — GLUCOSE, CAPILLARY
Glucose-Capillary: 108 mg/dL — ABNORMAL HIGH (ref 70–99)
Glucose-Capillary: 171 mg/dL — ABNORMAL HIGH (ref 70–99)

## 2022-06-23 SURGERY — ATRIAL FIBRILLATION ABLATION
Anesthesia: General

## 2022-06-23 MED ORDER — PHENYLEPHRINE HCL-NACL 20-0.9 MG/250ML-% IV SOLN
INTRAVENOUS | Status: DC | PRN
  Administered 2022-06-23: 30 ug/min via INTRAVENOUS

## 2022-06-23 MED ORDER — ACETAMINOPHEN 325 MG PO TABS: 650.0000 mg | ORAL_TABLET | ORAL | Status: DC | PRN

## 2022-06-23 MED ORDER — SODIUM CHLORIDE 0.9 % IV SOLN
INTRAVENOUS | Status: DC
Start: 2022-06-23 — End: 2022-06-23

## 2022-06-23 MED ORDER — SUGAMMADEX SODIUM 200 MG/2ML IV SOLN
INTRAVENOUS | Status: DC | PRN
  Administered 2022-06-23: 200 mg via INTRAVENOUS

## 2022-06-23 MED ORDER — ONDANSETRON HCL 4 MG/2ML IJ SOLN
INTRAMUSCULAR | Status: DC | PRN
  Administered 2022-06-23: 4 mg via INTRAVENOUS

## 2022-06-23 MED ORDER — HEPARIN SODIUM (PORCINE) 1000 UNIT/ML IJ SOLN
INTRAMUSCULAR | Status: AC
  Filled 2022-06-23: qty 10

## 2022-06-23 MED ORDER — PROTAMINE SULFATE 10 MG/ML IV SOLN
INTRAVENOUS | Status: DC | PRN
  Administered 2022-06-23: 40 mg via INTRAVENOUS

## 2022-06-23 MED ORDER — COLCHICINE 0.6 MG PO TABS: 0.6000 mg | ORAL_TABLET | Freq: Two times a day (BID) | ORAL | 0 refills | Status: DC

## 2022-06-23 MED ORDER — HEPARIN (PORCINE) IN NACL 1000-0.9 UT/500ML-% IV SOLN
INTRAVENOUS | Status: AC
  Filled 2022-06-23: qty 500

## 2022-06-23 MED ORDER — COLCHICINE 0.6 MG PO TABS
0.6000 mg | ORAL_TABLET | Freq: Once | ORAL | Status: AC
  Administered 2022-06-23: 0.6 mg via ORAL
  Filled 2022-06-23: qty 1

## 2022-06-23 MED ORDER — SODIUM CHLORIDE 0.9% FLUSH: 3.0000 mL | INTRAVENOUS | Status: DC | PRN

## 2022-06-23 MED ORDER — PHENYLEPHRINE HCL-NACL 20-0.9 MG/250ML-% IV SOLN
INTRAVENOUS | Status: AC
  Filled 2022-06-23: qty 750

## 2022-06-23 MED ORDER — LIDOCAINE 2% (20 MG/ML) 5 ML SYRINGE
INTRAMUSCULAR | Status: DC | PRN
  Administered 2022-06-23: 80 mg via INTRAVENOUS

## 2022-06-23 MED ORDER — HEPARIN SODIUM (PORCINE) 1000 UNIT/ML IJ SOLN
INTRAMUSCULAR | Status: DC | PRN
  Administered 2022-06-23: 10000 [IU] via INTRAVENOUS
  Administered 2022-06-23: 2000 [IU] via INTRAVENOUS
  Administered 2022-06-23: 4000 [IU] via INTRAVENOUS
  Administered 2022-06-23: 2000 [IU] via INTRAVENOUS

## 2022-06-23 MED ORDER — SODIUM CHLORIDE 0.9 % IV SOLN: 250.0000 mL | INTRAVENOUS | Status: DC | PRN

## 2022-06-23 MED ORDER — HEPARIN (PORCINE) IN NACL 1000-0.9 UT/500ML-% IV SOLN
INTRAVENOUS | Status: DC | PRN
  Administered 2022-06-23 (×4): 500 mL

## 2022-06-23 MED ORDER — PHENYLEPHRINE 80 MCG/ML (10ML) SYRINGE FOR IV PUSH (FOR BLOOD PRESSURE SUPPORT)
PREFILLED_SYRINGE | INTRAVENOUS | Status: DC | PRN
  Administered 2022-06-23: 120 ug via INTRAVENOUS
  Administered 2022-06-23: 160 ug via INTRAVENOUS

## 2022-06-23 MED ORDER — ROCURONIUM BROMIDE 10 MG/ML (PF) SYRINGE
PREFILLED_SYRINGE | INTRAVENOUS | Status: DC | PRN
  Administered 2022-06-23: 20 mg via INTRAVENOUS
  Administered 2022-06-23: 80 mg via INTRAVENOUS

## 2022-06-23 MED ORDER — PROPOFOL 10 MG/ML IV BOLUS
INTRAVENOUS | Status: DC | PRN
  Administered 2022-06-23: 130 mg via INTRAVENOUS
  Administered 2022-06-23 (×2): 30 mg via INTRAVENOUS

## 2022-06-23 MED ORDER — ONDANSETRON HCL 4 MG/2ML IJ SOLN: 4.0000 mg | Freq: Four times a day (QID) | INTRAMUSCULAR | Status: DC | PRN

## 2022-06-23 MED ORDER — HEPARIN SODIUM (PORCINE) 1000 UNIT/ML IJ SOLN
INTRAMUSCULAR | Status: DC | PRN
  Administered 2022-06-23: 1000 [IU] via INTRAVENOUS

## 2022-06-23 MED ORDER — FENTANYL CITRATE (PF) 250 MCG/5ML IJ SOLN
INTRAMUSCULAR | Status: DC | PRN
  Administered 2022-06-23: 100 ug via INTRAVENOUS

## 2022-06-23 MED ORDER — DEXAMETHASONE SODIUM PHOSPHATE 10 MG/ML IJ SOLN
INTRAMUSCULAR | Status: DC | PRN
  Administered 2022-06-23: 5 mg via INTRAVENOUS

## 2022-06-23 MED ORDER — MIDAZOLAM HCL 2 MG/2ML IJ SOLN
INTRAMUSCULAR | Status: DC | PRN
  Administered 2022-06-23: 1 mg via INTRAVENOUS

## 2022-06-23 SURGICAL SUPPLY — 19 items
BAG SNAP BAND KOVER 36X36 (MISCELLANEOUS) IMPLANT
CATH ABLAT QDOT MICRO BI TC DF (CATHETERS) IMPLANT
CATH OCTARAY 2.0 F 3-3-3-3-3 (CATHETERS) IMPLANT
CATH PIGTAIL STEERABLE D1 8.7 (WIRE) IMPLANT
CATH S-M CIRCA TEMP PROBE (CATHETERS) IMPLANT
CATH SOUNDSTAR ECO 8FR (CATHETERS) IMPLANT
CATH WEB BI DIR CSDF CRV REPRO (CATHETERS) IMPLANT
CLOSURE PERCLOSE PROSTYLE (VASCULAR PRODUCTS) IMPLANT
COVER SWIFTLINK CONNECTOR (BAG) ×1 IMPLANT
PACK EP LATEX FREE (CUSTOM PROCEDURE TRAY) ×1
PACK EP LF (CUSTOM PROCEDURE TRAY) ×1 IMPLANT
PAD DEFIB RADIO PHYSIO CONN (PAD) ×1 IMPLANT
PATCH CARTO3 (PAD) IMPLANT
SHEATH CARTO VIZIGO SM CVD (SHEATH) IMPLANT
SHEATH PINNACLE 7F 10CM (SHEATH) IMPLANT
SHEATH PINNACLE 8F 10CM (SHEATH) IMPLANT
SHEATH PINNACLE 9F 10CM (SHEATH) IMPLANT
SHEATH PROBE COVER 6X72 (BAG) IMPLANT
TUBING SMART ABLATE COOLFLOW (TUBING) IMPLANT

## 2022-06-23 NOTE — Anesthesia Procedure Notes (Addendum)
Procedure Name: Intubation Date/Time: 06/23/2022 8:43 AM  Performed by: Janace Litten, CRNAPre-anesthesia Checklist: Patient identified, Emergency Drugs available, Suction available and Patient being monitored Patient Re-evaluated:Patient Re-evaluated prior to induction Oxygen Delivery Method: Circle System Utilized Preoxygenation: Pre-oxygenation with 100% oxygen Induction Type: IV induction Ventilation: Mask ventilation without difficulty, Two handed mask ventilation required and Oral airway inserted - appropriate to patient size Laryngoscope Size: Mac and 4 Grade View: Grade I Tube type: Oral Number of attempts: 1 Airway Equipment and Method: Stylet and Oral airway Placement Confirmation: ETT inserted through vocal cords under direct vision, positive ETCO2 and breath sounds checked- equal and bilateral Secured at: 23 cm Tube secured with: Tape Dental Injury: Teeth and Oropharynx as per pre-operative assessment

## 2022-06-23 NOTE — H&P (Signed)
Electrophysiology Office Note   Date:  06/23/2022   ID:  Christopher Mikes., DOB 1956-03-05, MRN [id number]  PCP:  Arthur Holms, NP  Cardiologist:  Gardiner Rhyme Primary Electrophysiologist:  Vici Novick Meredith Leeds, MD    Chief Complaint: AF   History of Present Illness: Christopher Noah. is a 66 y.o. male who is being seen today for the evaluation of AF at the request of No ref. provider found. Presenting today for electrophysiology evaluation.  He has a history significant for chronic combined systolic and diastolic heart failure, atrial fibrillation, tobacco abuse, hypertension.  He was initially followed in advanced heart failure clinic but his ejection fraction improved.  He was hospitalized September 2021 with acute heart failure.  He was cardioverted at that time.  His ejection fraction was down to 20%.  He was in cardiogenic shock with acute renal failure, lactic acidosis.  He was started on milrinone.  His ejection fraction has since improved.  Today, denies symptoms of palpitations, chest pain, shortness of breath, orthopnea, PND, lower extremity edema, claudication, dizziness, presyncope, syncope, bleeding, or neurologic sequela. The patient is tolerating medications without difficulties. Plan AF ablation today.    Past Medical History:  Diagnosis Date   Atrial fibrillation (HCC)    CHF (congestive heart failure) (HCC)    Diabetes (HCC)    Dyspnea    Elevated LFTs    Hypertension    Insomnia    Prediabetes    Sleep apnea    SOB (shortness of breath)    Tubular adenoma of colon    Vitamin D deficiency    Past Surgical History:  Procedure Laterality Date   CARDIOVERSION N/A 06/20/2020   Procedure: CARDIOVERSION;  Surgeon: Jerline Pain, MD;  Location: [place];  Service: Cardiovascular;  Laterality: N/A;   CARDIOVERSION N/A 08/20/2020   Procedure: CARDIOVERSION;  Surgeon: [name], MD;  Location: [place];  Service: Cardiovascular;  Laterality: N/A;    CHOLECYSTECTOMY     OTHER SURGICAL HISTORY     "Shot a couple of times"   RIGHT/LEFT HEART CATH AND CORONARY ANGIOGRAPHY N/A 08/18/2020   Procedure: RIGHT/LEFT HEART CATH AND CORONARY ANGIOGRAPHY;  Surgeon: [name], MD;  Location: [place] [facility];  Service: Cardiovascular;  Laterality: N/A;   TEE WITHOUT CARDIOVERSION N/A 06/20/2020   Procedure: TRANSESOPHAGEAL ECHOCARDIOGRAM (TEE);  Surgeon: Jerline Pain, MD;  Location: [facility] ENDOSCOPY;  Service: Cardiovascular;  Laterality: N/A;   TEE WITHOUT CARDIOVERSION N/A 08/20/2020   Procedure: TRANSESOPHAGEAL ECHOCARDIOGRAM (TEE);  Surgeon: [name], MD;  Location: [facility] ENDOSCOPY;  Service: Cardiovascular;  Laterality: N/A;     Current Facility-Administered Medications  Medication Dose Route Frequency Provider Last Rate Last Admin   0.9 %  sodium chloride infusion   Intravenous Continuous Constance Haw, MD 50 mL/hr at 06/23/22 0643 New Bag at 06/23/22 6433    Allergies:   Codeine and Lisinopril   Social History:  The patient  reports that he has been smoking cigarettes. He has been smoking an average of .25 packs per day. He has never used smokeless tobacco. He reports current alcohol use of about 2.0 standard drinks of alcohol per week. He reports that he does not use drugs.   Family History:  The patient's family history includes Cancer in his mother; High Cholesterol in his father; High blood pressure in his father; Obesity in his mother.   ROS:  Please see the history of present illness.   Otherwise, review of systems is  positive for none.   All other systems are reviewed and negative.   PHYSICAL EXAM: VS:  Temp (!) 97.4 F (36.3 C) (Oral)   Resp 18   Ht _0  (1.676 m)   Wt 103 kg   BMI 36.64 kg/m  , BMI Body mass index is 36.64 kg/m. GEN: Well nourished, well developed, in no acute distress  HEENT: normal  Neck: no JVD, carotid bruits, or masses Cardiac: RRR; no murmurs, rubs, or gallops,no edema   Respiratory:  clear to auscultation bilaterally, normal work of breathing GI: soft, nontender, nondistended, + BS MS: no deformity or atrophy  Skin: warm and dry Neuro:  Strength and sensation are intact Psych: euthymic mood, full affect  Recent Labs: 05/05/2022: Magnesium 2.2; TSH 0.828 05/22/2022: ALT 100; BUN 11; Creatinine, Ser 1.21; Hemoglobin 15.5; Platelets 193; Potassium 3.4; Sodium 140    Lipid Panel     Component Value Date/Time   CHOL 186 12/31/2021 0839   TRIG 155 (H) 12/31/2021 0839   HDL 46 12/31/2021 0839   CHOLHDL 3.2 06/19/2020 0120   VLDL 9 06/19/2020 0120   LDLCALC 113 (H) 12/31/2021 0839     Wt Readings from Last 3 Encounters:  06/23/22 103 kg  05/10/22 114.3 kg  05/10/22 112.5 kg      Other studies Reviewed: Additional studies/ records that were reviewed today include: TTE 12/05/21  Review of the above records today demonstrates:   1. E/E' may not be accurate due to MAC [id number]. Left ventricular ejection  fraction, by estimation, is 50 to 55%. The left ventricle has low normal  function. The left ventricle has no regional wall motion abnormalities.  The left ventricular internal cavity   size was mildly dilated. There is mild left ventricular hypertrophy. Left  ventricular diastolic parameters are consistent with Grade I diastolic  dysfunction (impaired relaxation).   2. Right ventricular systolic function is normal. The right ventricular  size is normal.   3. Degenerative with calcified subchordal structures . The mitral valve  is abnormal. Trivial mitral valve regurgitation. No evidence of mitral  stenosis. Moderate mitral annular calcification.   4. Likely very mild AS peak velocity 14msec gardients / AVA not done .  The aortic valve is calcified. There is moderate calcification of the  aortic valve. Aortic valve regurgitation is not visualized. Mild to  moderate aortic valve  sclerosis/calcification is present, without any evidence of aortic   stenosis.   5. The inferior vena cava is normal in size with greater than 50%  respiratory variability, suggesting right atrial pressure of 3 mmHg.    ASSESSMENT AND PLAN:  1.  Persistent atrial fibrillation: FDub Mikes has presented today for surgery, with the diagnosis of AF.  The various methods of treatment have been discussed with the patient and family. After consideration of risks, benefits and other options for treatment, the patient has consented to  Procedure(s): Catheter ablation as a surgical intervention .  Risks include but not limited to complete heart block, stroke, esophageal damage, nerve damage, bleeding, vascular damage, tamponade, perforation, MI, and death. The patient's history has been reviewed, patient examined, no change in status, stable for surgery.  I have reviewed the patient's chart and labs.  Questions were answered to the patient's satisfaction.    Soliyana Mcchristian CCurt Bears MD 06/23/2022 7:27 AM

## 2022-06-23 NOTE — Progress Notes (Signed)
Up and walked and tolerated well; bilat groins stable, no bleeding or hematoma 

## 2022-06-23 NOTE — Progress Notes (Signed)
Got patient up to walk and patient right groin started bleeding. Pressure applied, bleeding stopped, Dr paged,  patient on bedrest for another hour.

## 2022-06-23 NOTE — Progress Notes (Addendum)
Report received from [facility]; Dr Lindajo Royal notified client had bleeding when up to bathroom and back to bed; no further bleeding noted and no hematoma; bedrest for 14mn more per Dr MLindajo Royal

## 2022-06-23 NOTE — Transfer of Care (Signed)
Immediate Anesthesia Transfer of Care Note  Patient: Christopher Wilkins.  Procedure(s) Performed: ATRIAL FIBRILLATION ABLATION  Patient Location: PACU and Cath Lab  Anesthesia Type:General  Level of Consciousness: oriented, drowsy and patient cooperative  Airway & Oxygen Therapy: Patient Spontanous Breathing and Patient connected to nasal cannula oxygen  Post-op Assessment: Report given to RN, Post -op Vital signs reviewed and stable and Patient moving all extremities X 4  Post vital signs: Reviewed and stable  Last Vitals:  Vitals Value Taken Time  BP    Temp    Pulse    Resp    SpO2      Last Pain:  Vitals:   06/23/22 0605  TempSrc: Oral  PainSc: 0-No pain         Complications: There were no known notable events for this encounter.

## 2022-06-23 NOTE — Discharge Instructions (Addendum)
Cardiac Ablation, Care After  This sheet gives you information about how to care for yourself after your procedure. Your health care provider may also give you more specific instructions. If you have problems or questions, contact your health care provider. What can I expect after the procedure? After the procedure, it is common to have: Bruising around your puncture site. Tenderness around your puncture site. Skipped heartbeats. Tiredness (fatigue).  Follow these instructions at home: Puncture site care  Follow instructions from your health care provider about how to take care of your puncture site. Make sure you: If present, leave stitches (sutures), skin glue, or adhesive strips in place. These skin closures may need to stay in place for up to 2 weeks. If adhesive strip edges start to loosen and curl up, you may trim the loose edges. Do not remove adhesive strips completely unless your health care provider tells you to do that. If a large square bandage is present, this may be removed 24 hours after surgery.  Check your puncture site every day for signs of infection. Check for: Redness, swelling, or pain. Fluid or blood. If your puncture site starts to bleed, lie down on your back, apply firm pressure to the area, and contact your health care provider. Warmth. Pus or a bad smell. A pea or small marble sized lump at the site is normal and can take up to three months to resolve.  Driving Do not drive for at least 4 days after your procedure or however long your health care provider recommends. (Do not resume driving if you have previously been instructed not to drive for other health reasons.) Do not drive or use heavy machinery while taking prescription pain medicine. Activity Avoid activities that take a lot of effort for at least 7 days after your procedure. Do not lift anything that is heavier than 5 lb (4.5 kg) for one week.  No sexual activity for 1 week.  Return to your normal  activities as told by your health care provider. Ask your health care provider what activities are safe for you. General instructions Take over-the-counter and prescription medicines only as told by your health care provider. Do not use any products that contain nicotine or tobacco, such as cigarettes and e-cigarettes. If you need help quitting, ask your health care provider. You may shower after 24 hours, but Do not take baths, swim, or use a hot tub for 1 week.  Do not drink alcohol for 24 hours after your procedure. Keep all follow-up visits as told by your health care provider. This is important. Contact a health care provider if: You have redness, mild swelling, or pain around your puncture site. You have fluid or blood coming from your puncture site that stops after applying firm pressure to the area. Your puncture site feels warm to the touch. You have pus or a bad smell coming from your puncture site. You have a fever. You have chest pain or discomfort that spreads to your neck, jaw, or arm. You are sweating a lot. You feel nauseous. You have a fast or irregular heartbeat. You have shortness of breath. You are dizzy or light-headed and feel the need to lie down. You have pain or numbness in the arm or leg closest to your puncture site. Get help right away if: Your puncture site suddenly swells. Your puncture site is bleeding and the bleeding does not stop after applying firm pressure to the area. These symptoms may represent a serious problem that is   an emergency. Do not wait to see if the symptoms will go away. Get medical help right away. Call your local emergency services (911 in the [place] Do not drive yourself to the hospital. Summary After the procedure, it is normal to have bruising and tenderness at the puncture site in your groin, neck, or forearm. Check your puncture site every day for signs of infection. Get help right away if your puncture site is bleeding and the  bleeding does not stop after applying firm pressure to the area. This is a medical emergency. This information is not intended to replace advice given to you by your health care provider. Make sure you discuss any questions you have with your health care provider.     You have an appointment set up with the [facility].  Multiple studies have shown that being followed by a dedicated atrial fibrillation clinic in addition to the standard care you receive from your other physicians improves health. We believe that enrollment in the atrial fibrillation clinic will allow [place] to better care for you.   The phone number to the [facility] is [phone]. The clinic is staffed Monday through Friday from 8:30am to 5pm.  Parking Directions: The clinic is located in the Heart and Vascular Building connected to [facility]. 1)From [address] turn on to [address] and go to the 3rd entrance  (Heart and Vascular entrance) on the right. 2)Look to the right for Heart &Vascular Parking Garage. 3)A code for the entrance is required, for October is 1504.   4)Take the elevators to the 1st floor. Registration is in the room with the glass walls at the end of the hallway.  If you have any trouble parking or locating the clinic, please don't hesitate to call [phone].

## 2022-06-23 NOTE — Progress Notes (Signed)
Patient and daughter was given discharge instructions. Both verbalized understanding. 

## 2022-06-24 ENCOUNTER — Encounter (HOSPITAL_COMMUNITY): Payer: Self-pay | Admitting: Cardiology

## 2022-06-24 NOTE — Anesthesia Postprocedure Evaluation (Signed)
Anesthesia Post Note  Patient: Christopher Wilkins.  Procedure(s) Performed: ATRIAL FIBRILLATION ABLATION     Patient location during evaluation: PACU Anesthesia Type: General Level of consciousness: awake and alert Pain management: pain level controlled Vital Signs Assessment: post-procedure vital signs reviewed and stable Respiratory status: spontaneous breathing, nonlabored ventilation, respiratory function stable and patient connected to nasal cannula oxygen Cardiovascular status: blood pressure returned to baseline and stable Postop Assessment: no apparent nausea or vomiting Anesthetic complications: no   There were no known notable events for this encounter.  Last Vitals:  Vitals:   06/23/22 1430 06/23/22 1500  BP: 120/80 134/81  Pulse: 78 81  Resp: 19 17  Temp:    SpO2: 92% 95%    Last Pain:  Vitals:   06/23/22 1250  TempSrc:   PainSc: 0-No pain                 Nobuo Nunziata

## 2022-07-07 ENCOUNTER — Other Ambulatory Visit (INDEPENDENT_AMBULATORY_CARE_PROVIDER_SITE_OTHER): Payer: Self-pay | Admitting: Bariatrics

## 2022-07-07 DIAGNOSIS — I509 Heart failure, unspecified: Secondary | ICD-10-CM

## 2022-07-14 ENCOUNTER — Ambulatory Visit (INDEPENDENT_AMBULATORY_CARE_PROVIDER_SITE_OTHER): Payer: Medicare Other | Admitting: Adult Health

## 2022-07-14 ENCOUNTER — Encounter (INDEPENDENT_AMBULATORY_CARE_PROVIDER_SITE_OTHER): Payer: Self-pay | Admitting: Adult Health

## 2022-07-14 VITALS — BP 120/85 | HR 77 | Temp 98.2°F | Ht 66.0 in | Wt 249.0 lb

## 2022-07-14 DIAGNOSIS — R413 Other amnesia: Secondary | ICD-10-CM

## 2022-07-14 DIAGNOSIS — E669 Obesity, unspecified: Secondary | ICD-10-CM

## 2022-07-14 DIAGNOSIS — I4891 Unspecified atrial fibrillation: Secondary | ICD-10-CM | POA: Diagnosis not present

## 2022-07-14 DIAGNOSIS — I10 Essential (primary) hypertension: Secondary | ICD-10-CM

## 2022-07-14 DIAGNOSIS — Z7984 Long term (current) use of oral hypoglycemic drugs: Secondary | ICD-10-CM

## 2022-07-14 DIAGNOSIS — E6609 Other obesity due to excess calories: Secondary | ICD-10-CM

## 2022-07-14 DIAGNOSIS — E1169 Type 2 diabetes mellitus with other specified complication: Secondary | ICD-10-CM

## 2022-07-14 DIAGNOSIS — Z6841 Body Mass Index (BMI) 40.0 and over, adult: Secondary | ICD-10-CM

## 2022-07-14 MED ORDER — BLOOD PRESSURE MONITOR DEVI: 0 refills | Status: DC

## 2022-07-14 MED ORDER — OZEMPIC (0.25 OR 0.5 MG/DOSE) 2 MG/3ML ~~LOC~~ SOPN: PEN_INJECTOR | SUBCUTANEOUS | 0 refills | Status: DC

## 2022-07-15 ENCOUNTER — Other Ambulatory Visit: Payer: Self-pay | Admitting: Nurse Practitioner

## 2022-07-16 ENCOUNTER — Encounter: Payer: Self-pay | Admitting: *Deleted

## 2022-07-21 ENCOUNTER — Encounter (HOSPITAL_COMMUNITY): Payer: Self-pay | Admitting: Nurse Practitioner

## 2022-07-21 ENCOUNTER — Ambulatory Visit (HOSPITAL_COMMUNITY)
Admission: RE | Admit: 2022-07-21 | Discharge: 2022-07-21 | Disposition: A | Discharge status: Home / Self Care | Payer: Medicare Other | Source: Ambulatory Visit | Attending: Nurse Practitioner | Admitting: Nurse Practitioner

## 2022-07-21 VITALS — BP 122/70 | HR 76 | Ht 66.0 in | Wt 251.8 lb

## 2022-07-21 DIAGNOSIS — Z7901 Long term (current) use of anticoagulants: Secondary | ICD-10-CM | POA: Diagnosis not present

## 2022-07-21 DIAGNOSIS — I509 Heart failure, unspecified: Secondary | ICD-10-CM | POA: Diagnosis not present

## 2022-07-21 DIAGNOSIS — D6869 Other thrombophilia: Secondary | ICD-10-CM | POA: Diagnosis not present

## 2022-07-21 DIAGNOSIS — E119 Type 2 diabetes mellitus without complications: Secondary | ICD-10-CM | POA: Insufficient documentation

## 2022-07-21 DIAGNOSIS — I429 Cardiomyopathy, unspecified: Secondary | ICD-10-CM | POA: Insufficient documentation

## 2022-07-21 DIAGNOSIS — R413 Other amnesia: Secondary | ICD-10-CM

## 2022-07-21 DIAGNOSIS — I110 Hypertensive heart disease with heart failure: Secondary | ICD-10-CM | POA: Diagnosis not present

## 2022-07-21 DIAGNOSIS — R410 Disorientation, unspecified: Secondary | ICD-10-CM | POA: Diagnosis not present

## 2022-07-21 DIAGNOSIS — I4891 Unspecified atrial fibrillation: Secondary | ICD-10-CM | POA: Insufficient documentation

## 2022-07-21 DIAGNOSIS — R4182 Altered mental status, unspecified: Secondary | ICD-10-CM

## 2022-07-21 NOTE — Progress Notes (Signed)
Chief Complaint:   OBESITY Paxon is here to discuss his progress with his obesity treatment plan along with follow-up of his obesity related diagnoses. Ewin is on the Category 3 Plan and states he is following his eating plan approximately unsure% of the time. Najib states he is walking 60+ minutes 7 times per week.  Today's visit was #: 9 Starting weight: 248 lbs Starting date: 12/31/2021 Today's weight: 249 lbs Today's date: 07/14/2022 Total lbs lost to date: 0 Total lbs lost since last in-office visit: 1 lb  Interim History:  He reports poor memory the last 2 weeks.   -He shared that he forgot to pick up the grandchildren.  -Forgot to put on his belt. -He was unable to locate his parked car at the grocery store in a timely manner- he ultimately did find his car. He denies recent head injury.   He endorses hx of tinnitus.   He appears to be neurologically intact at present- VSS, no apparent distress noted in office.  Of Note- This patient is new to me.  Subjective:   1. Atrial fibrillation with RVR ([place]) 03/19/22 Cards OV Notes: History of Present Illness: Glendon Dunwoody. is a 66 y.o. male who is being seen today for the evaluation of AF at the request of [name]. Presenting today for electrophysiology evaluation. He has a history significant for chronic combined systolic and diastolic heart failure, atrial fibrillation, tobacco abuse, hypertension.  He was initially followed in advanced heart failure clinic but his ejection fraction improved.  He was hospitalized September 2021 with acute heart failure.  He was cardioverted at that time.  His ejection fraction was down to 20%.  He was in cardiogenic shock with acute renal failure, lactic acidosis.  He was started on milrinone.  His ejection fraction has since improved. Today, he denies symptoms of palpitations, chest pain, shortness of breath, orthopnea, PND, lower extremity edema, claudication, dizziness,  presyncope, syncope, bleeding, or neurologic sequela. The patient is tolerating medications without difficulties.   2. Type 2 diabetes mellitus with other specified complication, unspecified whether long term insulin use (HCC) Farxiga 10 mg daily, metformin XR 500 mg daily.   He states he has never used Ozempic 0.25 mg- it has been listed on MAR since 05/10/22. Explained that Ozempic was sent to local pharmacy- address provided.  3. Poor memory He has experienced a recent neurological change, ie: memory deficits. He denies recent head injury.  4. Essential hypertension Blood pressure stable at office visit.  He denies current cardiac sx's at present.  Assessment/Plan:   1. Atrial fibrillation with RVR ([place]) 03/19/22 Cards OV Notes: 1.  Persistent atrial fibrillation: Currently on amiodarone 200 mg daily, Toprol-XL 25 mg daily, Eliquis 5 mg twice daily.  CHA2DS2-VASc of 2.  Related get off of amiodarone.   He also has had a tachycardia mediated cardiomyopathy with improvement in his ejection fraction.   He would benefit from ablation.  Risk and benefits have been discussed.  He understands these risks and is agreed to the procedure.   Risk, benefits, and alternatives to EP study and radiofrequency ablation for afib were also discussed in detail today. These risks include but are not limited to stroke, bleeding, vascular damage, tamponade, perforation, damage to the esophagus, lungs, and other structures, pulmonary vein stenosis, worsening renal function, and death. The patient understands these risk and wishes to proceed.  We will therefore proceed with catheter ablation at the next available time.  Carto,  ICE, anesthesia are requested for the procedure.  Will also obtain CT PV protocol prior to the procedure to exclude LAA thrombus and further evaluate atrial anatomy.    2.  Chronic combined systolic and diastolic heart failure: Currently on optimal medical therapy per primary cardiology.   Ejection fraction is since improved.   3.  Obstructive sleep apnea: CPAP compliance encouraged   4.  Morbid obesity: Follows with healthy weight and wellness Body mass index is 40.48 kg/m.   5.  Secondary hypercoagulable state: Currently on Eliquis for atrial fibrillation as above  Labs/ tests ordered today include:     Orders Placed This Encounter  Procedures   CT CARDIAC MORPH/PULM VEIN W/CM&W/O CA SCORE   FU with Will Camnitz 3 months  2. Type 2 diabetes mellitus with other specified complication, unspecified whether long term insulin use (HCC) Continue Farxiga 10 mg daily, metformin XR 500 mg daily.   Start - Semaglutide,0.25 or 0.'5MG'$ /DOS, (OZEMPIC, 0.25 OR 0.5 MG/DOSE,) 2 MG/3ML SOPN; Inject 0.'25mg'$  into the skin weekly  Dispense: 3 mL; Refill: 0[facility]   3. Poor memory Follow up with PCP-call today.  Discussed Red Flag sx's and if any develop seek immediate medical assistance. Pt verbalized understanding and agreement.  4. Essential hypertension Ordered-Blood Pressure Monitor DEVI; Please provide patient with insurance approved blood pressure monitor I10.0  Dispense: 1 each; Refill: 0  5. Obesity, current BMI 40.2 Handout:  Category meal plan, Category 3 grocery list.   Dayvin is currently in the action stage of change. As such, his goal is to continue with weight loss efforts. He has agreed to the Category 3 Plan.   Exercise goals:  As is.   Behavioral modification strategies: planning for success.  Trip has agreed to follow-up with our clinic in 4 weeks. He was informed of the importance of frequent follow-up visits to maximize his success with intensive lifestyle modifications for his multiple health conditions.   Objective:   Blood pressure 120/85, pulse 77, temperature 98.2 F (36.8 C), height '5\' 6"'$  (1.676 m), weight 249 lb (112.9 kg), SpO2 94 %. Body mass index is 40.19 kg/m.  General: Cooperative, alert, well developed, in no acute  distress. HEENT: Conjunctivae and lids unremarkable. Cardiovascular: Regular rhythm.  Lungs: Normal work of breathing. Neurologic: No focal deficits.   Lab Results  Component Value Date   CREATININE 1.37 (H) 06/23/2022   BUN 17 06/23/2022   NA 142 06/23/2022   K 3.7 06/23/2022   CL 110 06/23/2022   CO2 26 06/23/2022   Lab Results  Component Value Date   ALT 100 (H) 05/22/2022   AST 77 (H) 05/22/2022   ALKPHOS 75 05/22/2022   BILITOT 0.5 05/22/2022   Lab Results  Component Value Date   HGBA1C 6.5 (H) 12/31/2021   HGBA1C 5.9 (H) 08/17/2020   HGBA1C 5.8 (H) 06/19/2020   Lab Results  Component Value Date   INSULIN 71.9 (H) 12/31/2021   Lab Results  Component Value Date   TSH 0.828 05/05/2022   Lab Results  Component Value Date   CHOL 186 12/31/2021   HDL 46 12/31/2021   LDLCALC 113 (H) 12/31/2021   TRIG 155 (H) 12/31/2021   CHOLHDL 3.2 06/19/2020   Lab Results  Component Value Date   VD25OH 27.3 (L) 12/31/2021   Lab Results  Component Value Date   WBC 5.8 06/23/2022   HGB 15.5 06/23/2022   HCT 47.1 06/23/2022   MCV 84.3 06/23/2022   PLT  255 06/23/2022   Lab Results  Component Value Date   FERRITIN 136.6 05/10/2022    Obesity Behavioral Intervention:   Approximately 15 minutes were spent on the discussion below.  ASK: We discussed the diagnosis of obesity with Pilar Plate today and Demarko agreed to give [place] permission to discuss obesity behavioral modification therapy today.  ASSESS: Woodard has the diagnosis of obesity and his BMI today is 40.2. [place] is in the action stage of change.   ADVISE: Esai was educated on the multiple health risks of obesity as well as the benefit of weight loss to improve his health. He was advised of the need for long term treatment and the importance of lifestyle modifications to improve his current health and to decrease his risk of future health problems.  AGREE: Multiple dietary modification options and treatment options  were discussed and Urias agreed to follow the recommendations documented in the above note.  ARRANGE: Javen was educated on the importance of frequent visits to treat obesity as outlined per CMS and USPSTF guidelines and agreed to schedule his next follow up appointment today.  Attestation Statements:   Reviewed by clinician on day of visit: allergies, medications, problem list, medical history, surgical history, family history, social history, and previous encounter notes.  I, Davy Pique, RMA, am acting as Location manager for Mina Marble, NP.  I have reviewed the above documentation for accuracy and completeness, and I agree with the above. -  Jonahtan Manseau d. Shad Ledvina, NP-C

## 2022-07-21 NOTE — Progress Notes (Addendum)
Primary Care Physician: Arthur Holms, NP Referring Physician:Dr. [facility] Cardiology: Dr. Hinton Dyer. is a 66 y.o. male with a h/o HTN, DM,Cardiomyopathy/CHF, DM,  afib s/p ablation 06/23/22 in the afib clinic for one month f/u. From a afib perspective, he has done well with staying in SR since the procedure with no swallowing or groin issues.  However, the daughter is with him today and reports on 10/2 he went to pick up his grand kids from  school, which he does on a regualr basis. He called his daughter and asked why did she send him to pick up the kids if the school was closed. The daughter got in the car and found her father driving in a loop around the school. She was able to stop him and noted slurred speech and rt sided mouth droop. She wanted him to go to the ED but he got very combative and she told him to drive straight home. The patient today says he just got  things confused that day. Since then, no further breeches of memory like that or any neuro deficits but he states that his memory is not as good as in the past. They both feel these changes are more so since the ablation but the daughter noted that his memory  was slipping a little before the procedure. He states compliance with eliquis 5 mg bid. No neuro deficits noted today.   Today, he denies symptoms of palpitations, chest pain, shortness of breath, orthopnea, PND, lower extremity edema, dizziness, presyncope, syncope, or neurologic sequela. The patient is tolerating medications without difficulties and is otherwise without complaint today.   Past Medical History:  Diagnosis Date   Atrial fibrillation (HCC)    CHF (congestive heart failure) (HCC)    Diabetes ([place])    Dyspnea    Elevated LFTs    Fatty liver    Hypertension    Insomnia    Prediabetes    Sleep apnea    SOB (shortness of breath)    Tubular adenoma of colon    Vitamin D deficiency    Past Surgical History:  Procedure Laterality Date    ATRIAL FIBRILLATION ABLATION N/A 06/23/2022   Procedure: ATRIAL FIBRILLATION ABLATION;  Surgeon: Constance Haw, MD;  Location: [place] [facility];  Service: Cardiovascular;  Laterality: N/A;   CARDIOVERSION N/A 06/20/2020   Procedure: CARDIOVERSION;  Surgeon: Jerline Pain, MD;  Location: [facility] ENDOSCOPY;  Service: Cardiovascular;  Laterality: N/A;   CARDIOVERSION N/A 08/20/2020   Procedure: CARDIOVERSION;  Surgeon: [name], MD;  Location: [place];  Service: Cardiovascular;  Laterality: N/A;   CHOLECYSTECTOMY     OTHER SURGICAL HISTORY     "Shot a couple of times"   RIGHT/LEFT HEART CATH AND CORONARY ANGIOGRAPHY N/A 08/18/2020   Procedure: RIGHT/LEFT HEART CATH AND CORONARY ANGIOGRAPHY;  Surgeon: [name], MD;  Location: [place] [facility];  Service: Cardiovascular;  Laterality: N/A;   TEE WITHOUT CARDIOVERSION N/A 06/20/2020   Procedure: TRANSESOPHAGEAL ECHOCARDIOGRAM (TEE);  Surgeon: Jerline Pain, MD;  Location: [facility] ENDOSCOPY;  Service: Cardiovascular;  Laterality: N/A;   TEE WITHOUT CARDIOVERSION N/A 08/20/2020   Procedure: TRANSESOPHAGEAL ECHOCARDIOGRAM (TEE);  Surgeon: [name], MD;  Location: [facility] ENDOSCOPY;  Service: Cardiovascular;  Laterality: N/A;    Current Outpatient Medications  Medication Sig Dispense Refill   albuterol (VENTOLIN HFA) 108 (90 Base) MCG/ACT inhaler INHALE 1-2 PUFFS BY MOUTH EVERY 6 HOURS AS NEEDED FOR WHEEZE OR  SHORTNESS OF BREATH 8.5 each 0   amiodarone (PACERONE) 200 MG tablet TAKE 1 TABLET (200 MG) BY ORAL ROUTE ONCE DAILY 90 tablet 3   apixaban (ELIQUIS) 5 MG TABS tablet TAKE 1 TABLET (5 MG TOTAL) BY MOUTH 2 (TWO) TIMES DAILY. 60 tablet 0   atorvastatin (LIPITOR) 80 MG tablet Take 80 mg by mouth every evening.     blood glucose meter kit and supplies KIT Dispense based on patient and insurance preference. Use up to two times daily as directed. 1 each 0   Blood Pressure Monitor DEVI Please provide patient with insurance  approved blood pressure monitor I10.0 1 each 0   Cholecalciferol (VITAMIN D) 50 MCG (2000 UT) tablet Take 2,000 Units by mouth daily.     dapagliflozin propanediol (FARXIGA) 10 MG TABS tablet Take 10 mg by mouth daily.     ENTRESTO 49-51 MG Take 1 tablet by mouth 2 (two) times daily.     furosemide (LASIX) 80 MG tablet Take 1 tablet (80 mg total) by mouth daily. 90 tablet 0   metFORMIN (GLUCOPHAGE-XR) 500 MG 24 hr tablet Take 500 mg by mouth daily.     metoprolol succinate (TOPROL-XL) 25 MG 24 hr tablet TAKE 1 TABLET (25 MG TOTAL) BY MOUTH DAILY. 30 tablet 0   Misc. Devices (GNP DIGITAL WEIGHT SCALE) MISC 1 Device by Does not apply route daily. Please provide patient with insurance approved scale ICD110 I50.2 1 each 0   Semaglutide,0.25 or 0.5MG/DOS, (OZEMPIC, 0.25 OR 0.5 MG/DOSE,) 2 MG/3ML SOPN Inject 0.59m into the skin weekly (Patient not taking: Reported on 07/21/2022) 3 mL 0   No current facility-administered medications for this encounter.    Allergies  Allergen Reactions   Codeine Nausea And Vomiting   Lisinopril Other (See Comments)    "Didn't do well in my body"    Social History   Socioeconomic History   Marital status: Single    Spouse name: Not on file   Number of children: Not on file   Years of education: Not on file   Highest education level: Not on file  Occupational History   Occupation: Retired  Tobacco Use   Smoking status: Some Days    Packs/day: 0.25    Types: Cigarettes    Last attempt to quit: 05/14/2020    Years since quitting: 2.1   Smokeless tobacco: Never   Tobacco comments:    1-2 cigarettes daily  Vaping Use   Vaping Use: Never used  Substance and Sexual Activity   Alcohol use: Yes    Alcohol/week: 2.0 standard drinks of alcohol    Types: 2 Cans of beer per week    Comment: occasionally; once every 2 weeks   Drug use: No   Sexual activity: Not on file  Other Topics Concern   Not on file  Social History Narrative   Not on file   Social  Determinants of Health   Financial Resource Strain: Low Risk  (04/01/2022)   Overall Financial Resource Strain (CARDIA)    Difficulty of Paying Living Expenses: Not hard at all  Food Insecurity: No Food Insecurity (04/01/2022)   Hunger Vital Sign    Worried About Running Out of Food in the Last Year: Never true    Ran Out of Food in the Last Year: Never true  Transportation Needs: No Transportation Needs (04/01/2022)   PRAPARE - THydrologist(Medical): No    Lack of Transportation (Non-Medical): No  Physical Activity:  Insufficiently Active (04/01/2022)   Exercise Vital Sign    Days of Exercise per Week: 3 days    Minutes of Exercise per Session: 30 min  Stress: No Stress Concern Present (04/01/2022)   [place]    Feeling of Stress : Not at all  Social Connections: Not on file  Intimate Partner Violence: Not At Risk (04/01/2022)   Humiliation, Afraid, Rape, and Kick questionnaire    Fear of Current or Ex-Partner: No    Emotionally Abused: No    Physically Abused: No    Sexually Abused: No    Family History  Problem Relation Age of Onset   Cancer Mother    Obesity Mother    High blood pressure Father    High Cholesterol Father    Colon cancer Neg Hx    Depression Neg Hx     ROS- All systems are reviewed and negative except as per the HPI above  Physical Exam: Vitals:   07/21/22 1138  BP: 122/70  Pulse: 76  Weight: 114.2 kg  Height: '5\' 6"'  (1.676 m)   Wt Readings from Last 3 Encounters:  07/21/22 114.2 kg  07/14/22 112.9 kg  06/23/22 103 kg    Labs: Lab Results  Component Value Date   NA 142 06/23/2022   K 3.7 06/23/2022   CL 110 06/23/2022   CO2 26 06/23/2022   GLUCOSE 109 (H) 06/23/2022   BUN 17 06/23/2022   CREATININE 1.37 (H) 06/23/2022   CALCIUM 8.6 (L) 06/23/2022   PHOS 3.4 04/19/2019   MG 2.2 05/05/2022   Lab Results  Component Value Date   INR 1.2 (H)  05/10/2022   Lab Results  Component Value Date   CHOL 186 12/31/2021   HDL 46 12/31/2021   LDLCALC 113 (H) 12/31/2021   TRIG 155 (H) 12/31/2021     GEN- The patient is well appearing, alert and oriented x 3 today.   Head- normocephalic, atraumatic Eyes-  Sclera clear, conjunctiva pink Ears- hearing intact Oropharynx- clear Neck- supple, no JVP Lymph- no cervical lymphadenopathy Lungs- Clear to ausculation bilaterally, normal work of breathing Heart- Regular rate and rhythm, no murmurs, rubs or gallops, PMI not laterally displaced GI- soft, NT, ND, + BS Extremities- no clubbing, cyanosis, or edema MS- no significant deformity or atrophy Skin- no rash or lesion Psych- euthymic mood, full affect Neuro- strength and sensation are intact  EKG-Vent. rate 76 BPM PR interval 184 ms QRS duration 98 ms QT/QTcB 430/483 ms P-R-T axes -21 30 43 Normal sinus rhythm Prolonged QT Abnormal ECG When compared with ECG of 23-Jun-2022 12:36, PREVIOUS ECG IS PRESENT    Assessment and Plan:  1. Afib  S/p ablation 9/13 Maintaining  SR  Continue metoprolol 25 mg bid  Continue eliquis with a CHA2DS2VASc  of at least 4, he states compliance   2. Episode of confusion with slurred speech/ rt mouth droop 10/2, lasting several hours  Denies any head trauma Pt refused to seek medical attention at time of occurrence  Pt appears to be back at baseline today  No neuro deficients noted today He admits his memory has slipped some   I discussed the above episode with Dr. Curt Bears  He suggested head CT and f/u with neurology which is being arranged I informed daughter and pt if any further episodes to this degree go to the ED Do not miss any eliquis   F/u as above  and with Dr. Gardiner Rhyme 12/6  and Dr.  Curt Bears 12/11  Geroge Baseman. Keilyn Haggard, [facility] [address] [place], [place] [address] [phone]

## 2022-07-21 NOTE — Addendum Note (Signed)
Encounter addended by: Juluis Mire, RN on: 07/21/2022 4:45 PM  Actions taken: Visit diagnoses modified, Order list changed, Diagnosis association updated

## 2022-07-26 ENCOUNTER — Telehealth (INDEPENDENT_AMBULATORY_CARE_PROVIDER_SITE_OTHER): Payer: Self-pay | Admitting: Adult Health

## 2022-07-26 NOTE — Telephone Encounter (Signed)
Pt called stating that he lost some dosages of his Ozempic when a home health nurse tried to administered it, but did not correctly. Patient needs his dosages replaced. Please call the patient at [phone].

## 2022-07-28 ENCOUNTER — Telehealth (INDEPENDENT_AMBULATORY_CARE_PROVIDER_SITE_OTHER): Payer: Medicare Other | Admitting: Internal Medicine

## 2022-07-28 ENCOUNTER — Encounter: Payer: Self-pay | Admitting: Internal Medicine

## 2022-07-28 VITALS — BP 122/78 | HR 83 | Ht 68.0 in | Wt 251.0 lb

## 2022-07-28 DIAGNOSIS — Z86010 Personal history of colonic polyps: Secondary | ICD-10-CM | POA: Diagnosis not present

## 2022-07-28 DIAGNOSIS — R7989 Other specified abnormal findings of blood chemistry: Secondary | ICD-10-CM

## 2022-07-28 DIAGNOSIS — K760 Fatty (change of) liver, not elsewhere classified: Secondary | ICD-10-CM

## 2022-07-28 NOTE — Progress Notes (Addendum)
Subjective:    Patient ID: Christopher Wilkins., male    DOB: January 17, 1956, 66 y.o.   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  He is here alone today and was last seen on 05/10/2022.  Since his last office visit we did additional lab testing as well as an ultrasound of the abdomen.  This ultrasound showed steatosis but no changes of cirrhosis.  Also the lab work was largely unrevealing though he has continued to have persistently elevated transaminases.  He has not had any abdominal pain, itching, increasing abdominal girth or lower extremity edema.  No dark urine.  He did undergo successful atrial fibrillation ablation but to this point remains on amiodarone.  He also remains on Eliquis.  He recalls that he had a colonoscopy done earlier this year at [facility] but I do not have these records.   Review of Systems As per HPI, otherwise negative  Current Medications, Allergies, Past Medical History, Past Surgical History, Family History and Social History were reviewed in [employer] record.    Objective:   Physical Exam BP 122/78   Pulse 83   Ht '5\' 8"'$  (1.727 m)   Wt 251 lb (113.9 kg)   SpO2 96%   BMI 38.16 kg/m  Gen: awake, alert, NAD HEENT: anicteric, op clear CV: RRR, no mrg Pulm: CTA b/l Abd: soft, obese, NT/ND, +BS throughout Ext: no c/c/e Neuro: nonfocal     Latest Ref Rng & Units 06/23/2022    6:47 AM 05/22/2022    9:38 AM 06/05/2021    9:00 AM  CBC  WBC 4.0 - 10.5 K/uL 5.8  4.1  5.2   Hemoglobin 13.0 - 17.0 g/dL 15.5  15.5  16.8   Hematocrit 39.0 - 52.0 % 47.1  48.1  52.0   Platelets 150 - 400 K/uL 255  193  231    CMP     Component Value  Date/Time   NA 142 06/23/2022 0647   NA 143 05/05/2022 0847   K 3.7 06/23/2022 0647   CL 110 06/23/2022 0647   CO2 26 06/23/2022 0647   GLUCOSE 109 (H) 06/23/2022 0647   BUN 17 06/23/2022 0647   BUN 16 05/05/2022 0847   CREATININE 1.37 (H) 06/23/2022 0647   CALCIUM 8.6 (L) 06/23/2022 0647   PROT 6.6 05/22/2022 0938   PROT 7.4 05/05/2022 0847   ALBUMIN 3.1 (L) 05/22/2022 0938   ALBUMIN 4.1 05/05/2022 0847   AST 77 (H) 05/22/2022 0938   ALT 100 (H) 05/22/2022 0938   ALKPHOS 75 05/22/2022 0938   BILITOT 0.5 05/22/2022 0938   BILITOT 0.4 05/05/2022 0847   GFRNONAA 57 (L) 06/23/2022 0647   GFRAA 79 07/11/2020 1002   Lab Results  Component Value Date   INR 1.2 (H) 05/10/2022   INR 1.2 08/20/2020   INR 1.3 (H) 06/20/2020    CT ABDOMEN AND PELVIS WITH CONTRAST   TECHNIQUE: Multidetector CT imaging of the abdomen and pelvis was performed using the standard protocol following bolus administration of intravenous contrast.   RADIATION DOSE REDUCTION: This exam was performed according to the departmental dose-optimization program which includes automated exposure control, adjustment of the mA and/or kV according  to patient size and/or use of iterative reconstruction technique.   CONTRAST:  75m OMNIPAQUE IOHEXOL 300 MG/ML  SOLN   COMPARISON:  Abdomen sonogram done on 05/14/2022   FINDINGS: Lower chest: Small linear densities are seen in lower lung fields suggesting scarring or subsegmental atelectasis. Calcifications are seen in the aortic and mitral annulus.   Hepatobiliary: There is fatty infiltration liver. Surgical clips are seen in gallbladder fossa. There is no dilation of bile ducts.   Pancreas: No focal abnormalities are seen.   Spleen: Unremarkable.   Adrenals/Urinary Tract: Adrenals are unremarkable. There is no hydronephrosis. There are no renal or ureteral stones. Urinary bladder is unremarkable.   Stomach/Bowel: Stomach is not distended. Small bowel loops  are not dilated. Appendix is not dilated. There is no significant wall thickening in colon. There is no pericolic stranding.   Vascular/Lymphatic: Scattered arterial calcifications are seen.   Reproductive: Unremarkable.   Other: There is no ascites or pneumoperitoneum. Umbilical hernia containing fat is seen. Left inguinal hernia containing fat is seen.   Musculoskeletal: Degenerative changes are noted in lumbar spine with encroachment of neural foramina, more so at L5-S1 level.   IMPRESSION: There is no evidence of intestinal obstruction or pneumoperitoneum. There is no hydronephrosis. Appendix is not dilated.   Lumbar spondylosis. Fatty liver. Umbilical and left inguinal hernias containing fat.     Electronically Signed   By: PElmer PickerM.D.   On: 05/22/2022 17:01    ABDOMEN ULTRASOUND COMPLETE   COMPARISON:  Chest CT 11/12/2020   FINDINGS: Gallbladder: Surgically absent   Common bile duct: Not visualized.   Liver: Increased echogenicity. No focal lesion. Portal vein is patent on color Doppler imaging with normal direction of blood flow towards the liver.   IVC: No abnormality visualized.   Pancreas: Visualized portion unremarkable.   Spleen: Size and appearance within normal limits.   Right Kidney: Length: 11.1 cm. Echogenicity within normal limits. No mass or hydronephrosis visualized.   Left Kidney: Length: 11.7 cm. Echogenicity within normal limits. No mass or hydronephrosis visualized.   Abdominal aorta: No aneurysm visualized.   Other findings: None.   IMPRESSION: Exam limited secondary to overlying bowel gas.   Prior cholecystectomy.   Increased hepatic parenchymal echogenicity suggestive of steatosis.     Electronically Signed   By: DLovey NewcomerM.D.   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  MASLD (MASH, formerly NAFLD/NASH) --imaging and lab work point to metabolic fatty liver disease which makes sense with his overall medical history.  Fortunately he does not have changes of cirrhosis or portal hypertension.  We did discuss the importance of risk factor modification, diet and exercise when possible to reduce fatty liver inflammation.  He does continue Ozempic and hopefully this will lead to more significant weight reduction.  I recommended that he strictly avoid alcohol.  I also think if we could stop amiodarone it would be preferable for his liver. -- Monitor liver enzymes over time -- Risk factor modification with tight control of blood pressure, glucose, lipids; healthy diet discussed -- Hopefully amiodarone may be able to be discontinued but would defer this to electrophysiology post ablation -- Avoid alcohol --  17-monthfollow-up  2.  History of adenomatous colon polyp in 2016 --I had plan today to discuss surveillance colonoscopy with him however he feels that [facility]  We will request records from this clinic because if he did have a colonoscopy he certainly would not need one with me. -- [facility] if [facility] had [facility]oscopy earlier this year; if colonoscopy was not performed he is due for surveillance at this time  30 minutes total spent today including patient facing time, coordination of care, reviewing medical history/procedures/pertinent radiology studies, and documentation of the encounter.  Addendum:  [facility] Patient did undergo colonoscopy which revealed several polyps removed from the sigmoid colon and rectum.  All pathology was hyperplastic  polyps. Prep reported is not adequate and recall was recommended for 3 years -- Please place recall for colonoscopy in June 2025 [facility] would discuss with [facility] at that time

## 2022-07-28 NOTE — Patient Instructions (Signed)
_______________________________________________________  If you are age 67 or older, your body mass index should be between 23-30. Your Body mass index is 38.16 kg/m. If this is out of the aforementioned range listed, please consider follow up with your Primary Care Provider.  If you are age 70 or younger, your body mass index should be between 19-25. Your Body mass index is 38.16 kg/m. If this is out of the aformentioned range listed, please consider follow up with your Primary Care Provider.   ________________________________________________________  The [facility] providers would like to encourage you to use [facility] to communicate with providers for non-urgent requests or questions.  Due to long hold times on the telephone, sending your provider a message by [facility] may be a faster and more efficient way to get a response.  Please allow 48 business hours for a response.  Please remember that this is for non-urgent requests.  _______________________________________________________  Please follow up in 6 months

## 2022-08-03 ENCOUNTER — Other Ambulatory Visit (INDEPENDENT_AMBULATORY_CARE_PROVIDER_SITE_OTHER): Payer: Self-pay | Admitting: Bariatrics

## 2022-08-03 DIAGNOSIS — I509 Heart failure, unspecified: Secondary | ICD-10-CM
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
## 2022-08-26 ENCOUNTER — Ambulatory Visit (INDEPENDENT_AMBULATORY_CARE_PROVIDER_SITE_OTHER): Payer: Medicare Other | Admitting: Psychiatry

## 2022-08-26 ENCOUNTER — Telehealth: Payer: Self-pay | Admitting: Psychiatry

## 2022-08-26 VITALS — BP 115/78 | HR 76 | Ht 68.0 in | Wt 244.1 lb

## 2022-08-26 DIAGNOSIS — R29810 Facial weakness: Secondary | ICD-10-CM | POA: Diagnosis not present

## 2022-08-26 DIAGNOSIS — R413 Other amnesia: Secondary | ICD-10-CM | POA: Diagnosis not present

## 2022-08-26 NOTE — Patient Instructions (Addendum)
Plan: -Brain MRI -Blood work to check vitamin B12 level  Mild cognitive impairment A person with mild cognitive impairment (MCI) experiences memory problems greater than normally expected with aging, but not serious enough to interfere with daily activities.  The patient with MCI complains of difficulty with memory. Typically, the complaints include trouble remembering the names of people they met recently, trouble remembering the flow of a conversation, and an increased tendency to misplace things, or similar problems. In many cases, the individual will be aware of these difficulties and will compensate with increased reliance on notes and calendars.   Although there is an increased chances of going on to develop dementia, it is not possible currently to predict with certainty which patients with MCI will or will not go on to develop dementia. There is currently no specific treatment for MCI. People leading sedentary lifestyles are at greater risk for developing dementia. Increased physical activity and brain exercise can help with maintaining brain function.  Tasks to improve attention/working memory 1. Good sleep hygiene (7-8 hrs of sleep) 2. Learning a new skill (Painting, Carpentry, Pottery, new language, Knitting). 3.Cognitive exercises (keep a daily journal, Puzzles) 4. Physical exercise and training  (30 min/day X 4 days week) 5. Being on Antidepressant if needed 6.Yoga, Meditation, Tai Chi 7. Decrease alcohol intake 8.Have a clear schedule and structure in daily routine  MIND Diet: The [place] Diet Intervention for Neurodegenerative Delay, or MIND diet, targets the health of the aging brain. Research participants with the highest MIND diet scores had a significantly slower rate of cognitive decline compared with those with the lowest scores. The effects of the MIND diet on cognition showed greater effects than either the Mediterranean or the DASH diet alone.  The healthy  items the MIND diet guidelines suggest include:  3+ servings a day of whole grains 1+ servings a day of vegetables (other than green leafy) 6+ servings a week of green leafy vegetables 5+ servings a week of nuts 4+ meals a week of beans 2+ servings a week of berries 2+ meals a week of poultry 1+ meals a week of fish Mainly olive oil if added fat is used  The unhealthy items, which are higher in saturated and trans fat, include: Less than 5 servings a week of pastries and sweets Less than 4 servings a week of red meat (including beef, pork, lamb, and products made from these meats) Less than one serving a week of cheese and fried foods Less than 1 tablespoon a day of butter/stick margarine

## 2022-08-26 NOTE — Telephone Encounter (Signed)
[employer] medicare/[place] medicaid NPR sent to [facility] [phone] 

## 2022-08-27 LAB — VITAMIN B12: Vitamin B-12: 1154 pg/mL (ref 232–1245)

## 2022-09-02 ENCOUNTER — Other Ambulatory Visit (INDEPENDENT_AMBULATORY_CARE_PROVIDER_SITE_OTHER): Payer: Self-pay | Admitting: Adult Health

## 2022-09-02 DIAGNOSIS — E1169 Type 2 diabetes mellitus with other specified complication: Secondary | ICD-10-CM

## 2022-09-10 ENCOUNTER — Ambulatory Visit
Admission: RE | Admit: 2022-09-10 | Discharge: 2022-09-10 | Disposition: A | Discharge status: Home / Self Care | Payer: Medicare Other | Source: Ambulatory Visit | Attending: Psychiatry | Admitting: Psychiatry

## 2022-09-10 DIAGNOSIS — R413 Other amnesia: Secondary | ICD-10-CM | POA: Diagnosis not present

## 2022-09-14 ENCOUNTER — Other Ambulatory Visit: Payer: Self-pay | Admitting: Psychiatry

## 2022-09-14 ENCOUNTER — Encounter (INDEPENDENT_AMBULATORY_CARE_PROVIDER_SITE_OTHER): Payer: Self-pay | Admitting: Physician Assistant

## 2022-09-14 ENCOUNTER — Ambulatory Visit (INDEPENDENT_AMBULATORY_CARE_PROVIDER_SITE_OTHER): Payer: Medicare Other | Admitting: Physician Assistant

## 2022-09-14 VITALS — BP 120/81 | HR 67 | Temp 98.2°F | Ht 66.0 in | Wt 241.0 lb

## 2022-09-14 DIAGNOSIS — Z6838 Body mass index (BMI) 38.0-38.9, adult: Secondary | ICD-10-CM

## 2022-09-14 DIAGNOSIS — I4891 Unspecified atrial fibrillation: Secondary | ICD-10-CM | POA: Diagnosis not present

## 2022-09-14 DIAGNOSIS — Z7985 Long-term (current) use of injectable non-insulin antidiabetic drugs: Secondary | ICD-10-CM

## 2022-09-14 DIAGNOSIS — E669 Obesity, unspecified: Secondary | ICD-10-CM

## 2022-09-14 DIAGNOSIS — Z7984 Long term (current) use of oral hypoglycemic drugs: Secondary | ICD-10-CM

## 2022-09-14 DIAGNOSIS — E6609 Other obesity due to excess calories: Secondary | ICD-10-CM

## 2022-09-14 DIAGNOSIS — E1169 Type 2 diabetes mellitus with other specified complication: Secondary | ICD-10-CM

## 2022-09-14 DIAGNOSIS — E7849 Other hyperlipidemia: Secondary | ICD-10-CM

## 2022-09-14 DIAGNOSIS — R413 Other amnesia: Secondary | ICD-10-CM

## 2022-09-14 MED ORDER — OZEMPIC (0.25 OR 0.5 MG/DOSE) 2 MG/3ML ~~LOC~~ SOPN: PEN_INJECTOR | SUBCUTANEOUS | 0 refills | Status: DC

## 2022-09-14 MED ORDER — BLOOD GLUCOSE MONITORING SUPPL SUPPLIES MISC: 1.0000 | Freq: Two times a day (BID) | 0 refills | Status: AC

## 2022-09-14 NOTE — Progress Notes (Unsigned)
Cardiology Office Note:    Date:  05/05/2022   ID:  [name], DOB 1956/03/21, MRN [id number]  PCP:  Arthur Holms, NP  Cardiologist:  Donato Heinz, MD  Electrophysiologist:  None   Referring MD: [name], NP   Chief Complaint  Patient presents with   Follow-up   Congestive Heart Failure     History of Present Illness:    Christopher Wilkins. is a 66 y.o. male with a hx of chronic combined systolic congestive heart failure, atrial fibrillation, tobacco use, hypertension who presents for follow-up.  He previously followed with Dr. Haroldine Laws in the advanced heart failure clinic.  He was admitted in September 2021 with acute combined heart failure and atrial fibrillation.  Echocardiogram showed LVEF 35 to 40%.  He underwent TEE/DCCV with restoration of sinus rhythm.  He was discharged on Eliquis, Entresto, metoprolol, and Lasix.  He was readmitted in November 2021 with acute heart failure and atrial fibrillation.  Echocardiogram showed EF was down to 20%.  He was found to be in cardiogenic shock with AKI, lactic acidosis, coox 44%.  He was started on milrinone and diuresed with IV Lasix.  RHC/LHC showed normal coronary arteries.  He was able to be weaned off milrinone and eventually underwent successful TEE/DCCV with restoration of sinus rhythm.  Hospital follow-up in December he was found to be back in atrial fibrillation.  Was referred to A. fib clinic and noted to be back in sinus rhythm.  Zio patch was placed, which showed no atrial fibrillation.  Echocardiogram on 12/05/2020 showed EF 50 to 55%, normal RV function, likely mild AS.  Since last clinic visit,  ***Amio  he reports that he is doing well.  Denies any chest pain, dyspnea, lightheadedness, syncope, lower extremity edema, or palpitations.reports compliance with CPAP.  Previously had quit smoking but now is smoking 1 cigarette/day.   Wt Readings from Last 3 Encounters:  05/05/22 253 lb 9.6 oz (115 kg)  04/06/22  252 lb (114.3 kg)  03/19/22 254 lb 9.6 oz (115.5 kg)     Past Medical History:  Diagnosis Date   Atrial fibrillation (HCC)    CHF (congestive heart failure) (HCC)    Dyspnea    Hypertension    Insomnia    Prediabetes    Sleep apnea    SOB (shortness of breath)     Past Surgical History:  Procedure Laterality Date   CARDIOVERSION N/A 06/20/2020   Procedure: CARDIOVERSION;  Surgeon: Jerline Pain, MD;  Location: [place];  Service: Cardiovascular;  Laterality: N/A;   CARDIOVERSION N/A 08/20/2020   Procedure: CARDIOVERSION;  Surgeon: [name], MD;  Location: [place];  Service: Cardiovascular;  Laterality: N/A;   CHOLECYSTECTOMY     OTHER SURGICAL HISTORY     "Shot a couple of times"   RIGHT/LEFT HEART CATH AND CORONARY ANGIOGRAPHY N/A 08/18/2020   Procedure: RIGHT/LEFT HEART CATH AND CORONARY ANGIOGRAPHY;  Surgeon: [name], MD;  Location: [place] [facility];  Service: Cardiovascular;  Laterality: N/A;   TEE WITHOUT CARDIOVERSION N/A 06/20/2020   Procedure: TRANSESOPHAGEAL ECHOCARDIOGRAM (TEE);  Surgeon: Jerline Pain, MD;  Location: [facility] ENDOSCOPY;  Service: Cardiovascular;  Laterality: N/A;   TEE WITHOUT CARDIOVERSION N/A 08/20/2020   Procedure: TRANSESOPHAGEAL ECHOCARDIOGRAM (TEE);  Surgeon: [name], MD;  Location: [facility] ENDOSCOPY;  Service: Cardiovascular;  Laterality: N/A;    Current Medications: Current Meds  Medication Sig   amiodarone (PACERONE) 200 MG tablet TAKE 1 TABLET (200  MG) BY ORAL ROUTE ONCE DAILY   apixaban (ELIQUIS) 5 MG TABS tablet TAKE 1 TABLET (5 MG TOTAL) BY MOUTH 2 (TWO) TIMES DAILY.   atorvastatin (LIPITOR) 80 MG tablet SMARTSIG:1 Tablet(s) By Mouth Every Evening   Cholecalciferol (VITAMIN D3 PO) Take 2,000 Units by mouth daily.   dapagliflozin propanediol (FARXIGA) 10 MG TABS tablet Take 10 mg by mouth daily.   ENTRESTO 49-51 MG Take 1 tablet by mouth 2 (two) times daily.   furosemide (LASIX) 80 MG tablet Take 1  tablet (80 mg total) by mouth daily.   metFORMIN (GLUMETZA) 500 MG (MOD) 24 hr tablet Take 500 mg by mouth daily with breakfast.   metoprolol succinate (TOPROL-XL) 25 MG 24 hr tablet TAKE 1 TABLET (25 MG TOTAL) BY MOUTH DAILY.     Allergies:   Codeine, Lisinopril, and Shellfish allergy   Social History   Socioeconomic History   Marital status: Single    Spouse name: Not on file   Number of children: Not on file   Years of education: Not on file   Highest education level: Not on file  Occupational History   Occupation: Retired  Tobacco Use   Smoking status: Some Days    Packs/day: 0.25    Types: Cigarettes    Last attempt to quit: 05/14/2020    Years since quitting: 1.9   Smokeless tobacco: Never   Tobacco comments:    1-2 cigarettes daily  Vaping Use   Vaping Use: Never used  Substance and Sexual Activity   Alcohol use: Yes    Alcohol/week: 2.0 standard drinks of alcohol    Types: 2 Cans of beer per week    Comment: occasionally; once every 2 weeks   Drug use: No   Sexual activity: Not on file  Other Topics Concern   Not on file  Social History Narrative   Not on file   Social Determinants of Health   Financial Resource Strain: Low Risk  (04/01/2022)   Overall Financial Resource Strain (CARDIA)    Difficulty of Paying Living Expenses: Not hard at all  Food Insecurity: No Food Insecurity (04/01/2022)   Hunger Vital Sign    Worried About Running Out of Food in the Last Year: Never true    Ran Out of Food in the Last Year: Never true  Transportation Needs: No Transportation Needs (04/01/2022)   PRAPARE - Hydrologist (Medical): No    Lack of Transportation (Non-Medical): No  Physical Activity: Insufficiently Active (04/01/2022)   Exercise Vital Sign    Days of Exercise per Week: 3 days    Minutes of Exercise per Session: 30 min  Stress: No Stress Concern Present (04/01/2022)   [place]    Feeling of Stress : Not at all  Social Connections: Not on file     Family History: The patient's family history includes Cancer in his mother; High Cholesterol in his father; High blood pressure in his father; Obesity in his mother. There is no history of Colon cancer or Depression.  ROS:   Please see the history of present illness.     All other systems reviewed and are negative.  EKGs/Labs/Other Studies Reviewed:    The following studies were reviewed today:   EKG:  EKG is  ordered today.  The ekg ordered today demonstrates normal sinus rhythm, rate 77, nonspecific T wave flattening, QTC 468   Recent Labs: 06/05/2021: BNP 9.9; Hemoglobin  16.8; Platelets 231 12/31/2021: TSH 0.917 01/14/2022: ALT 98 01/29/2022: BUN 26; Creatinine, Ser 1.30; Magnesium 2.3; Potassium 4.6; Sodium 142  Recent Lipid Panel    Component Value Date/Time   CHOL 186 12/31/2021 0839   TRIG 155 (H) 12/31/2021 0839   HDL 46 12/31/2021 0839   CHOLHDL 3.2 06/19/2020 0120   VLDL 9 06/19/2020 0120   LDLCALC 113 (H) 12/31/2021 0839    Physical Exam:    VS:  BP 138/88   Pulse 68   Ht '5\' 6"'$  (1.676 m)   Wt 253 lb 9.6 oz (115 kg)   SpO2 96%   BMI 40.93 kg/m     Wt Readings from Last 3 Encounters:  05/05/22 253 lb 9.6 oz (115 kg)  04/06/22 252 lb (114.3 kg)  03/19/22 254 lb 9.6 oz (115.5 kg)     GEN: Well nourished, well developed in no acute distress HEENT: Normal NECK: No JVD appreciated but difficult to assess given habitus; No carotid bruits CARDIAC: RRR, no murmurs, rubs, gallops RESPIRATORY:  Clear to auscultation without rales, wheezing or rhonchi  ABDOMEN: Soft, non-tender, non-distended MUSCULOSKELETAL:  No edema; No deformity  SKIN: Warm and dry NEUROLOGIC:  Alert and oriented x 3 PSYCHIATRIC:  Normal affect   ASSESSMENT:    1. Chronic combined systolic and diastolic heart failure (HCC)   2. Persistent atrial fibrillation ([place])   3. Essential hypertension   4.  Encounter for monitoring amiodarone therapy   5. Morbid obesity ([place])   6. Tobacco use      PLAN:    Chronic Combined Systolic and Diastolic HF: admission 65/03 for a/c systolic heart failure>>Cardiogenic shock requiring milrinone, in setting of Afib w/ RVR.  EF 35-40% in 9/21 -> reduced down to 20% on repeat study 11/21.  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 Cath with normal cors. Echo 12/05/20  EF 50-55% RV normal.  Status post A-fib ablation 06/23/2022 - Continue lasix 80 mg po daily.  Appears euvolemic.  Will check BMP/magnesium - Continue spiro 25 mg daily.  - Continue Entresto 49/51 BID - Continue Farxiga 10 mg daily   - Continue Toprol XL 25 mg daily.     Persistent Atrial Fibrillation s/p DC-CV 9/21. Had ERAF>>underwent repeat DCCV 11/21 back to NSR.  Zio 12/21 with no AF -Status post ablation 06/23/2022 with Dr. Curt Bears.  Appears to be maintaining sinus rhythm.  Previously was on amiodarone, discontinued after ablation - Continue Toprol XL 25 mg daily.  - Continue eliquis 5 mg bid. Denies abnormal bleeding.  - Started CPAP for OSA  HTN: Continue Entresto, metoprolol, spironolactone as above   OSA:  suspect untreated OSA led to A. fib which led to heart failure.  He reports compliance with CPAP  Tobacco use: Previously quit smoking but is now back to smoking 1 cigarette/day.  Strongly recommend cessation  Morbid obesity: Body mass index is 40.93 kg/m. Follows with healthy [facility]  Transaminitis: Follows with GI, has fatty liver disease.  Recommend stopping amiodarone  RTC in***    Medication Adjustments/Labs and Tests Ordered: Current medicines are reviewed at length with the patient today.  Concerns regarding medicines are outlined above.  Orders Placed This Encounter  Procedures   DG Chest 2 View   Comprehensive metabolic panel   TSH   Magnesium     No orders of the defined types were placed in this encounter.     Patient Instructions  Medication  Instructions:  Your physician recommends that you continue on your current  medications as directed. Please refer to the Current Medication list given to you today.  *If you need a refill on your cardiac medications before your next appointment, please call your pharmacy*   Lab Work: CMET, Mag, TSH today  If you have labs (blood work) drawn today and your tests are completely normal, you will receive your results only by: [place] (if you have MyChart) OR A paper copy in the mail If you have any lab test that is abnormal or we need to change your treatment, we will call you to review the results.   Testing/Procedures: A chest x-ray takes a picture of the organs and structures inside the chest, including the heart, lungs, and blood vessels. This test can show several things, including, whether the heart is enlarges; whether fluid is building up in the lungs; and whether pacemaker / defibrillator leads are still in place. [place] [address]-no appointment needed M-F 8-4  Follow-Up: At [facility], you and your health needs are our priority.  As part of our continuing mission to provide you with exceptional heart care, we have created designated Provider Care Teams.  These Care Teams include your primary Cardiologist (physician) and Advanced Practice Providers (APPs -  Physician Assistants and Nurse Practitioners) who all work together to provide you with the care you need, when you need it.  We recommend signing up for the patient portal called "MyChart".  Sign up information is provided on this After Visit Summary.  MyChart is used to connect with patients for Virtual Visits (Telemedicine).  Patients are able to view lab/test results, encounter notes, upcoming appointments, etc.  Non-urgent messages can be sent to your provider as well.   To learn more about what you can do with MyChart, go to [email or web address].    Your next appointment:   4 month(s)  The format for your  next appointment:   In Person  Provider:   Donato Heinz, MD {    Important Information About Sugar         Signed, Donato Heinz, MD  05/05/2022 9:57 AM    [place]

## 2022-09-15 ENCOUNTER — Encounter: Payer: Self-pay | Admitting: Cardiology

## 2022-09-15 ENCOUNTER — Ambulatory Visit: Payer: Medicare Other | Attending: Cardiology | Admitting: Cardiology

## 2022-09-15 VITALS — BP 122/80 | HR 74 | Ht 68.0 in | Wt 246.0 lb

## 2022-09-15 DIAGNOSIS — I10 Essential (primary) hypertension: Secondary | ICD-10-CM | POA: Diagnosis not present

## 2022-09-15 DIAGNOSIS — I4819 Other persistent atrial fibrillation: Secondary | ICD-10-CM | POA: Diagnosis not present

## 2022-09-15 DIAGNOSIS — I5042 Chronic combined systolic (congestive) and diastolic (congestive) heart failure: Secondary | ICD-10-CM

## 2022-09-15 DIAGNOSIS — Z720 Tobacco use: Secondary | ICD-10-CM

## 2022-09-15 NOTE — Patient Instructions (Signed)
Medication Instructions:  STOP Amiodarone ( I will call your pharmacy, you may finis the pills you have)  *If you need a refill on your cardiac medications before your next appointment, please call your pharmacy*   Lab Work: CMET, MAG, CBC today   If you have labs (blood work) drawn today and your tests are completely normal, you will receive your results only by: [place] (if you have MyChart) OR A paper copy in the mail If you have any lab test that is abnormal or we need to change your treatment, we will call you to review the results.   Follow-Up: At [facility], you and your health needs are our priority.  As part of our continuing mission to provide you with exceptional heart care, we have created designated Provider Care Teams.  These Care Teams include your primary Cardiologist (physician) and Advanced Practice Providers (APPs -  Physician Assistants and Nurse Practitioners) who all work together to provide you with the care you need, when you need it.  We recommend signing up for the patient portal called "MyChart".  Sign up information is provided on this After Visit Summary.  MyChart is used to connect with patients for Virtual Visits (Telemedicine).  Patients are able to view lab/test results, encounter notes, upcoming appointments, etc.  Non-urgent messages can be sent to your provider as well.   To learn more about what you can do with MyChart, go to [email or web address].    Your next appointment:   6 month(s)  The format for your next appointment:   In Person  Provider:   Donato Heinz, MD

## 2022-09-16 ENCOUNTER — Telehealth: Payer: Self-pay | Admitting: Cardiology

## 2022-09-16 DIAGNOSIS — E876 Hypokalemia: Secondary | ICD-10-CM

## 2022-09-16 DIAGNOSIS — I5042 Chronic combined systolic (congestive) and diastolic (congestive) heart failure: Secondary | ICD-10-CM

## 2022-09-16 LAB — COMPREHENSIVE METABOLIC PANEL
ALT: 130 IU/L — ABNORMAL HIGH (ref 0–44)
AST: 84 IU/L — ABNORMAL HIGH (ref 0–40)
Albumin/Globulin Ratio: 1.4 (ref 1.2–2.2)
Albumin: 4.2 g/dL (ref 3.9–4.9)
Alkaline Phosphatase: 104 IU/L (ref 44–121)
BUN/Creatinine Ratio: 13 (ref 10–24)
BUN: 16 mg/dL (ref 8–27)
Bilirubin Total: 0.6 mg/dL (ref 0.0–1.2)
CO2: 25 mmol/L (ref 20–29)
Calcium: 9.4 mg/dL (ref 8.6–10.2)
Chloride: 102 mmol/L (ref 96–106)
Creatinine, Ser: 1.22 mg/dL (ref 0.76–1.27)
Globulin, Total: 3 g/dL (ref 1.5–4.5)
Glucose: 93 mg/dL (ref 70–99)
Potassium: 3.4 mmol/L — ABNORMAL LOW (ref 3.5–5.2)
Sodium: 143 mmol/L (ref 134–144)
Total Protein: 7.2 g/dL (ref 6.0–8.5)
eGFR: 65 mL/min/{1.73_m2} (ref >59)

## 2022-09-16 LAB — CBC
Hematocrit: 53.5 % — ABNORMAL HIGH (ref 37.5–51.0)
Hemoglobin: 17.3 g/dL (ref 13.0–17.7)
MCH: 28 pg (ref 26.6–33.0)
MCHC: 32.3 g/dL (ref 31.5–35.7)
MCV: 87 fL (ref 79–97)
Platelets: 273 10*3/uL (ref 150–450)
RBC: 6.18 x10E6/uL — ABNORMAL HIGH (ref 4.14–5.80)
RDW: 15.4 % (ref 11.6–15.4)
WBC: 5.8 10*3/uL (ref 3.4–10.8)

## 2022-09-16 LAB — MAGNESIUM: Magnesium: 2 mg/dL (ref 1.6–2.3)

## 2022-09-16 MED ORDER — POTASSIUM CHLORIDE CRYS ER 20 MEQ PO TBCR: 20.0000 meq | EXTENDED_RELEASE_TABLET | Freq: Every day | ORAL | 0 refills | Status: DC

## 2022-09-16 NOTE — Telephone Encounter (Signed)
Patient was returning call for results. Please advise °

## 2022-09-16 NOTE — Telephone Encounter (Signed)
Donato Heinz, MD 09/16/2022  7:08 AM EST     Liver function test remain elevated, discontinued amiodarone yesterday.  Kidney function has improved from prior.  Potassium mildly reduced, recommend starting potassium chloride 20 meq daily.  Repeat BMET in 1 week    Patient aware and verbalized understanding.   Rx sent to pharmacy and lab order placed.

## 2022-09-20 ENCOUNTER — Encounter: Payer: Self-pay | Admitting: Cardiology

## 2022-09-20 ENCOUNTER — Ambulatory Visit: Payer: Medicare Other | Attending: Cardiology | Admitting: Cardiology

## 2022-09-20 VITALS — BP 124/78 | HR 73 | Ht 68.0 in | Wt 248.6 lb

## 2022-09-20 DIAGNOSIS — D6869 Other thrombophilia: Secondary | ICD-10-CM

## 2022-09-20 DIAGNOSIS — I5022 Chronic systolic (congestive) heart failure: Secondary | ICD-10-CM | POA: Diagnosis not present

## 2022-09-20 DIAGNOSIS — I4819 Other persistent atrial fibrillation: Secondary | ICD-10-CM

## 2022-09-20 NOTE — Progress Notes (Signed)
Electrophysiology Office Note   Date:  09/20/2022   ID:  Christopher Mikes., DOB [birth year], MRN [id number]  PCP:  Arthur Holms, NP  Cardiologist:  Gardiner Rhyme Primary Electrophysiologist:  Olivianna Higley Christopher Leeds, MD    Chief Complaint: AF   History of Present Illness: Christopher Knoke. is a 66 y.o. male who is being seen today for the evaluation of AF at the request of Arthur Holms, NP. Presenting today for electrophysiology evaluation.  He has a history significant chronic combined systolic and diastolic heart failure, atrial fibrillation, tobacco abuse, hypertension.  He is initially followed in advanced heart failure clinic but his ejection fraction improved.  He was hospitalized September 2021 with acute heart failure.  He was cardioverted at that time.  Ejection fraction was found to be 20%.  He was in cardiogenic shock with acute renal failure, lactic acidosis.  He was started on milrinone.  His ejection fraction since improved.  He is status post ablation 06/23/2022.  Today, denies symptoms of palpitations, chest pain, shortness of breath, orthopnea, PND, lower extremity edema, claudication, dizziness, presyncope, syncope, bleeding, or neurologic sequela. The patient is tolerating medications without difficulties.  Since his ablation he has done well.  He has noted no further episodes of atrial fibrillation.  He feels that his medications are getting in the way of his sex life.   Past Medical History:  Diagnosis Date   Atrial fibrillation (HCC)    CHF (congestive heart failure) (HCC)    Diabetes ([place])    Dyspnea    Elevated LFTs    Fatty liver    Hypertension    Insomnia    Prediabetes    Sleep apnea    SOB (shortness of breath)    Tubular adenoma of colon    Vitamin D deficiency    Past Surgical History:  Procedure Laterality Date   ATRIAL FIBRILLATION ABLATION N/A 06/23/2022   Procedure: ATRIAL FIBRILLATION ABLATION;  Surgeon: Constance Haw, MD;  Location: [place] [facility]  LAB;  Service: Cardiovascular;  Laterality: N/A;   CARDIOVERSION N/A 06/20/2020   Procedure: CARDIOVERSION;  Surgeon: Jerline Pain, MD;  Location: [facility] ENDOSCOPY;  Service: Cardiovascular;  Laterality: N/A;   CARDIOVERSION N/A 08/20/2020   Procedure: CARDIOVERSION;  Surgeon: [name], MD;  Location: [place];  Service: Cardiovascular;  Laterality: N/A;   CHOLECYSTECTOMY     OTHER SURGICAL HISTORY     "Shot a couple of times"   RIGHT/LEFT HEART CATH AND CORONARY ANGIOGRAPHY N/A 08/18/2020   Procedure: RIGHT/LEFT HEART CATH AND CORONARY ANGIOGRAPHY;  Surgeon: [name], MD;  Location: [place] [facility] LAB;  Service: Cardiovascular;  Laterality: N/A;   TEE WITHOUT CARDIOVERSION N/A 06/20/2020   Procedure: TRANSESOPHAGEAL ECHOCARDIOGRAM (TEE);  Surgeon: Jerline Pain, MD;  Location: [facility] ENDOSCOPY;  Service: Cardiovascular;  Laterality: N/A;   TEE WITHOUT CARDIOVERSION N/A 08/20/2020   Procedure: TRANSESOPHAGEAL ECHOCARDIOGRAM (TEE);  Surgeon: [name], MD;  Location[facility] ENDOSCOPY;  Service: Cardiovascular;  Laterality: N/A;     Current Outpatient Medications  Medication Sig Dispense Refill   albuterol (VENTOLIN HFA) 108 (90 Base) MCG/ACT inhaler INHALE 1-2 PUFFS BY MOUTH EVERY 6 HOURS AS NEEDED FOR WHEEZE OR SHORTNESS OF BREATH 8.5 each 0   amiodarone (PACERONE) 200 MG tablet Take 200 mg by mouth daily.     apixaban (ELIQUIS) 5 MG TABS tablet TAKE 1 TABLET (5 MG TOTAL) BY MOUTH 2 (TWO) TIMES DAILY. 60 tablet 0   atorvastatin (LIPITOR) 80 MG  tablet Take 80 mg by mouth every evening.     blood glucose meter kit and supplies KIT Dispense based on patient and insurance preference. Use up to two times daily as directed. 1 each 0   Blood Glucose Monitoring Suppl Supplies MISC 1 strip by Does not apply route 2 (two) times daily. 100 strip 0   Blood Pressure Monitor DEVI Please provide patient with insurance approved blood pressure monitor I10.0 1 each 0   Cholecalciferol  (VITAMIN D) 50 MCG (2000 UT) tablet Take 2,000 Units by mouth daily.     dapagliflozin propanediol (FARXIGA) 10 MG TABS tablet Take 10 mg by mouth daily.     ENTRESTO 49-51 MG Take 1 tablet by mouth 2 (two) times daily.     furosemide (LASIX) 80 MG tablet Take 80 mg by mouth.     metFORMIN (GLUCOPHAGE-XR) 500 MG 24 hr tablet Take 500 mg by mouth daily.     Misc. Devices (GNP DIGITAL WEIGHT SCALE) MISC 1 Device by Does not apply route daily. Please provide patient with insurance approved scale ICD110 I50.2 1 each 0   potassium chloride SA (KLOR-CON M) 20 MEQ tablet Take 1 tablet (20 mEq total) by mouth daily. 30 tablet 0   Semaglutide,0.25 or 0.5MG/DOS, (OZEMPIC, 0.25 OR 0.5 MG/DOSE,) 2 MG/3ML SOPN Inject 0.68m into the skin weekly 3 mL 0   No current facility-administered medications for this visit.    Allergies:   Codeine and Lisinopril   Social History:  The patient  reports that he has been smoking cigarettes. He has been smoking an average of .25 packs per day. He has never used smokeless tobacco. He reports current alcohol use of about 2.0 standard drinks of alcohol per week. He reports that he does not use drugs.   Family History:  The patient's family history includes Cancer in his mother; High Cholesterol in his father; High blood pressure in his father; Obesity in his mother.   ROS:  Please see the history of present illness.   Otherwise, review of systems is positive for none.   All other systems are reviewed and negative.   PHYSICAL EXAM: VS:  BP 124/78   Pulse 73   Ht _0  (1.727 m)   Wt 248 lb 9.6 oz (112.8 kg)   SpO2 96%   BMI 37.80 kg/m  , BMI Body mass index is 37.8 kg/m. GEN: Well nourished, well developed, in no acute distress  HEENT: normal  Neck: no JVD, carotid bruits, or masses Cardiac: RRR; no murmurs, rubs, or gallops,no edema  Respiratory:  clear to auscultation bilaterally, normal work of breathing GI: soft, nontender, nondistended, + BS MS: no deformity  or atrophy  Skin: warm and dry Neuro:  Strength and sensation are intact Psych: euthymic mood, full affect  EKG:  EKG is ordered today. Personal review of the ekg ordered shows sinus rhythm   Recent Labs: 05/05/2022: TSH 0.828 09/15/2022: ALT 130; BUN 16; Creatinine, Ser 1.22; Hemoglobin 17.3; Magnesium 2.0; Platelets 273; Potassium 3.4; Sodium 143    Lipid Panel     Component Value Date/Time   CHOL 186 12/31/2021 0839   TRIG 155 (H) 12/31/2021 0839   HDL 46 12/31/2021 0839   CHOLHDL 3.2 06/19/2020 0120   VLDL 9 06/19/2020 0120   LDLCALC 113 (H) 12/31/2021 0839     Wt Readings from Last 3 Encounters:  09/20/22 248 lb 9.6 oz (112.8 kg)  09/15/22 246 lb (111.6 kg)  09/14/22 241 lb (109.3 kg)  Other studies Reviewed: Additional studies/ records that were reviewed today include: TTE 12/05/21  Review of the above records today demonstrates:   1. E/E' may not be accurate due to MAC 82505. Left ventricular ejection  fraction, by estimation, is 50 to 55%. The left ventricle has low normal  function. The left ventricle has no regional wall motion abnormalities.  The left ventricular internal cavity   size was mildly dilated. There is mild left ventricular hypertrophy. Left  ventricular diastolic parameters are consistent with Grade I diastolic  dysfunction (impaired relaxation).   2. Right ventricular systolic function is normal. The right ventricular  size is normal.   3. Degenerative with calcified subchordal structures . The mitral valve  is abnormal. Trivial mitral valve regurgitation. No evidence of mitral  stenosis. Moderate mitral annular calcification.   4. Likely very mild AS peak velocity 24msec gardients / AVA not done .  The aortic valve is calcified. There is moderate calcification of the  aortic valve. Aortic valve regurgitation is not visualized. Mild to  moderate aortic valve  sclerosis/calcification is present, without any evidence of aortic  stenosis.    5. The inferior vena cava is normal in size with greater than 50%  respiratory variability, suggesting right atrial pressure of 3 mmHg.    ASSESSMENT AND PLAN:  1.  Persistent atrial fibrillation: CHA2DS2-VASc of 2.  Status post ablation 06/23/2022.  Has remained in sinus rhythm.  Carneshia Raker stop his metoprolol as he feels that it is harming his sex life.  2.  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  Ejection fraction has improved with maintenance of sinus rhythm.  No changes.  3.  Obstructive sleep apnea: CPAP compliance encouraged  4.  Morbid obesity: Lifestyle modification encouraged Body mass index is 37.8 kg/m.  5.  Secondary hypercoagulable state: Currently on Eliquis for atrial fibrillation as above   Current medicines are reviewed at length with the patient today.   The patient does not have concerns regarding his medicines.  The following changes were made today: Stop metoprolol  Labs/ tests ordered today include:  Orders Placed This Encounter  Procedures   EKG 12-Lead     Disposition:   FU 6 months  Signed, Deloy Archey MMeredith Leeds MD  09/20/2022 2:42 PM     [address] [phone](office) [phone](fax)

## 2022-09-20 NOTE — Patient Instructions (Addendum)
Medication Instructions:  Your physician has recommended you make the following change in your medication:  STOP Metoprolol succinate (Toprol)  *If you need a refill on your cardiac medications before your next appointment, please call your pharmacy*   Lab Work: None ordered   Testing/Procedures: None ordered   Follow-Up: At [facility], you and your health needs are our priority.  As part of our continuing mission to provide you with exceptional heart care, we have created designated Provider Care Teams.  These Care Teams include your primary Cardiologist (physician) and Advanced Practice Providers (APPs -  Physician Assistants and Nurse Practitioners) who all work together to provide you with the care you need, when you need it.  Your next appointment:   6 month(s)  The format for your next appointment:   In Person  Provider:   You will see one of the following Advanced Practice Providers on your designated Care Team:   Tommye Standard, [place] Legrand Como "Jonni Sanger" Chalmers Cater, [place]     Thank you for choosing [facility]!!   Trinidad Curet, RN [phone]  Other Instructions   Important Information About Sugar

## 2022-09-27 NOTE — Progress Notes (Unsigned)
Chief Complaint:   OBESITY Christopher Wilkins is here to discuss his progress with his obesity treatment plan along with follow-up of his obesity related diagnoses. Christopher Wilkins is on the Category 3 Plan and states he is following his eating plan approximately 100% of the time. Christopher Wilkins states he is walking 30 minutes 5 times per week.  Today's visit was #: 10 Starting weight: 248 lbs Starting date: 12/31/2021 Today's weight: 241 lbs Today's date: 09/14/2022 Total lbs lost to date: 7 lbs Total lbs lost since last in-office visit: 8  Interim History: Christopher Wilkins has done well with weight loss.  Carefully weighing out proteins.  Feels hunger is well-controlled on GLP-1 (Ozempic 0.5 mg weekly).  Pleased with his progress.  Subjective:   1. Type 2 diabetes mellitus with other specified complication, unspecified whether long term insulin use (HCC) Christopher Wilkins is taking Ozempic 0.5 mg weekly-no side effects.  Also on Farxiga 10 mg daily and  metformin 500 mg daily without side effects.  Will begin checking his blood sugars twice a day. No symptoms of hypoglycemia.   2. Other hyperlipidemia Christopher Wilkins is taking Lipitor 80 mg daily without side effects.  3. Atrial fibrillation with RVR (HCC) Christopher Wilkins is taking Amiodarone 200 mg daily and Eliquis 5 mg BID without side effects.  Follows regularly with cardiology.  Assessment/Plan:   1. Type 2 diabetes mellitus with other specified complication, unspecified whether long term insulin use (HCC) We will refill Ozempic 0.5 mg SQ once weekly for 1 month with 0 refills.  -Refill Blood Glucose Monitoring Suppl Supplies MISC; 1 strip by Does not apply route 2 (two) times daily.  Dispense: 100 strip; Refill: 0  -Refill Semaglutide,0.25 or 0.'5MG'$ /DOS, (OZEMPIC, 0.25 OR 0.5 MG/DOSE,) 2 MG/3ML SOPN; Inject 0.5 mg into the skin weekly  Dispense: 3 mL; Refill: 0  2. Other hyperlipidemia Continue taking Lipitor 80 mg daily and healthy eating plan to decrease simple carbohydrates, decrease  saturated fats and exercise.  3. Atrial fibrillation with RVR (HCC) Continue amiodarone 200 mg daily and Eliquis 5 mg BID and following with cardiology.  4. Obesity, current BMI 38.9 Christopher Wilkins is currently in the action stage of change. As such, his goal is to continue with weight loss efforts. He has agreed to the Category 3 Plan.   Exercise goals: As is.  Behavioral modification strategies: increasing lean protein intake, decreasing simple carbohydrates, increasing water intake, meal planning and cooking strategies, and holiday eating strategies .  Christopher Wilkins has agreed to follow-up with our clinic in 4 weeks. He was informed of the importance of frequent follow-up visits to maximize his success with intensive lifestyle modifications for his multiple health conditions.   Objective:   Blood pressure 120/81, pulse 67, temperature 98.2 F (36.8 C), height '5\' 6"'$  (1.676 m), weight 241 lb (109.3 kg), SpO2 96 %. Body mass index is 38.9 kg/m.  General: Cooperative, alert, well developed, in no acute distress. HEENT: Conjunctivae and lids unremarkable. Cardiovascular: Regular rhythm.  Lungs: Normal work of breathing. Neurologic: No focal deficits.   Lab Results  Component Value Date   CREATININE 1.22 09/15/2022   BUN 16 09/15/2022   NA 143 09/15/2022   K 3.4 (L) 09/15/2022   CL 102 09/15/2022   CO2 25 09/15/2022   Lab Results  Component Value Date   ALT 130 (H) 09/15/2022   AST 84 (H) 09/15/2022   ALKPHOS 104 09/15/2022   BILITOT 0.6 09/15/2022   Lab Results  Component Value Date   HGBA1C 6.5 (H)  12/31/2021   HGBA1C 5.9 (H) 08/17/2020   HGBA1C 5.8 (H) 06/19/2020   Lab Results  Component Value Date   INSULIN 71.9 (H) 12/31/2021   Lab Results  Component Value Date   TSH 0.828 05/05/2022   Lab Results  Component Value Date   CHOL 186 12/31/2021   HDL 46 12/31/2021   LDLCALC 113 (H) 12/31/2021   TRIG 155 (H) 12/31/2021   CHOLHDL 3.2 06/19/2020   Lab Results  Component  Value Date   VD25OH 27.3 (L) 12/31/2021   Lab Results  Component Value Date   WBC 5.8 09/15/2022   HGB 17.3 09/15/2022   HCT 53.5 (H) 09/15/2022   MCV 87 09/15/2022   PLT 273 09/15/2022   Lab Results  Component Value Date   FERRITIN 136.6 05/10/2022   Attestation Statements:   Reviewed by clinician on day of visit: allergies, medications, problem list, medical history, surgical history, family history, social history, and previous encounter notes.  I, Brendell Tyus, am acting as transcriptionist for [employer], [employer].  I have reviewed the above documentation for accuracy and completeness, and I agree with the above. -  Hajime Asfaw,PA-C

## 2022-09-29 ENCOUNTER — Encounter: Payer: Self-pay | Admitting: Psychology

## 2022-10-09 ENCOUNTER — Other Ambulatory Visit: Payer: Self-pay | Admitting: Cardiology

## 2022-10-12 ENCOUNTER — Ambulatory Visit (INDEPENDENT_AMBULATORY_CARE_PROVIDER_SITE_OTHER): Payer: Medicare Other | Admitting: Physician Assistant

## 2022-10-12 ENCOUNTER — Encounter (INDEPENDENT_AMBULATORY_CARE_PROVIDER_SITE_OTHER): Payer: Self-pay | Admitting: Physician Assistant

## 2022-10-12 VITALS — BP 124/82 | HR 76 | Temp 98.5°F | Ht 66.0 in | Wt 239.0 lb

## 2022-10-12 DIAGNOSIS — I509 Heart failure, unspecified: Secondary | ICD-10-CM

## 2022-10-12 DIAGNOSIS — E669 Obesity, unspecified: Secondary | ICD-10-CM

## 2022-10-12 DIAGNOSIS — E6609 Other obesity due to excess calories: Secondary | ICD-10-CM

## 2022-10-12 DIAGNOSIS — Z6838 Body mass index (BMI) 38.0-38.9, adult: Secondary | ICD-10-CM

## 2022-10-12 DIAGNOSIS — E1169 Type 2 diabetes mellitus with other specified complication: Secondary | ICD-10-CM

## 2022-10-12 DIAGNOSIS — Z7985 Long-term (current) use of injectable non-insulin antidiabetic drugs: Secondary | ICD-10-CM

## 2022-10-12 MED ORDER — OZEMPIC (0.25 OR 0.5 MG/DOSE) 2 MG/3ML ~~LOC~~ SOPN: PEN_INJECTOR | SUBCUTANEOUS | 0 refills | Status: DC

## 2022-10-12 MED ORDER — ALBUTEROL SULFATE HFA 108 (90 BASE) MCG/ACT IN AERS: 1.0000 | INHALATION_SPRAY | Freq: Three times a day (TID) | RESPIRATORY_TRACT | 0 refills | Status: DC | PRN

## 2022-10-24 NOTE — Progress Notes (Addendum)
Chief Complaint:   OBESITY Christopher Wilkins is here to discuss his progress with his obesity treatment plan along with follow-up of his obesity related diagnoses. Christopher Wilkins is on the Category 3 Plan and states he is following his eating plan approximately 100% of the time. Christopher Wilkins states he is walking 1/2-1 mile 6 times per week.  Today's visit was #: 11 Starting weight: 248 lbs Starting date: 12/31/2021 Today's weight: 239 lbs Today's date: 10/12/2021 Total lbs lost to date: 9 lbs Total lbs lost since last in-office visit: 2  Interim History: Christopher Wilkins has done well with overall weight loss. He recently flew to a small plane to [place] and got snowed in for several days and did have to make some adjustments/went off plan at due to this. He is focusing on making sure he is getting an adequate protein.  He has started monitoring his blood sugars as well and feels confident with doing this now. Feels his hunger is overall well-controlled.  He is on Ozempic 0.25 mg weekly without side effect.   Subjective:   1. Type 2 diabetes mellitus with other specified complication, unspecified whether long term insulin use (HCC) Christopher Wilkins is taking Ozempic 0.25 mg weekly. Some constipation-relived by drinking milk. Fasting blood sugars are 80s-105. Post-prandial 90-130. Wants to stay at current dose of Ozempic.  He is working on healthy eating to decrease simple carbohydrates, increase lean proteins and exercise to promote weight loss.  2. Congestive heart failure, unspecified HF chronicity, unspecified heart failure type (HCC) Christopher Wilkins is following regularly with cardiology. Appears well compensated currently. Reports rare shortness of breath--using Albuterol as needed every 2 weeks. No chest pain or shortness of breath, no orthopnea, no edema or palpitations.  Assessment/Plan:   1. Type 2 diabetes mellitus with other specified complication, unspecified whether long term insulin use (HCC) Continue/Refill Ozempic  0.25 mg SQ weekly for 1 month with 0 refills. Continue Prescribed Nutrition Plan to decrease simple carbohydrates, increase lean proteins and exercise to promote weight loss.  -Refill Semaglutide,0.25 or 0.'5MG'$ /DOS, (OZEMPIC, 0.25 OR 0.5 MG/DOSE,) 2 MG/3ML SOPN; Inject 0.'25mg'$  into the skin weekly  Dispense: 3 mL; Refill: 0  2. Congestive heart failure, unspecified HF chronicity, unspecified heart failure type (Christopher Wilkins) We will refill Albuterol as needed for 1 month with 0 refills. Continue Prescribed Nutrition Plan and exercise.  -Refill albuterol (VENTOLIN HFA) 108 (90 Base) MCG/ACT inhaler; Inhale 1-2 puffs into the lungs every 8 (eight) hours as needed for wheezing or shortness of breath.  Dispense: 8.5 each; Refill: 0  3. Obesity, current BMI 38.6 Christopher Wilkins is currently in the action stage of change. As such, his goal is to continue with weight loss efforts. He has agreed to the Category 3 Plan.   Exercise goals: As is.  Behavioral modification strategies: increasing lean protein intake, decreasing simple carbohydrates, meal planning and cooking strategies, and keeping healthy foods in the home.  Christopher Wilkins has agreed to follow-up with our clinic in 4 weeks. He was informed of the importance of frequent follow-up visits to maximize his success with intensive lifestyle modifications for his multiple health conditions.   Objective:   Blood pressure 124/82, pulse 76, temperature 98.5 F (36.9 C), height '5\' 6"'$  (1.676 m), weight 239 lb (108.4 kg), SpO2 97 %. Body mass index is 38.58 kg/m.  General: Cooperative, alert, well developed, in no acute distress. HEENT: Conjunctivae and lids unremarkable. Cardiovascular: Regular rhythm.  Lungs: Normal work of breathing. Neurologic: No focal deficits.   Lab Results  Component Value Date   CREATININE 1.22 09/15/2022   BUN 16 09/15/2022   NA 143 09/15/2022   K 3.4 (L) 09/15/2022   CL 102 09/15/2022   CO2 25 09/15/2022   Lab Results  Component Value  Date   ALT 130 (H) 09/15/2022   AST 84 (H) 09/15/2022   ALKPHOS 104 09/15/2022   BILITOT 0.6 09/15/2022   Lab Results  Component Value Date   HGBA1C 6.5 (H) 12/31/2021   HGBA1C 5.9 (H) 08/17/2020   HGBA1C 5.8 (H) 06/19/2020   Lab Results  Component Value Date   INSULIN 71.9 (H) 12/31/2021   Lab Results  Component Value Date   TSH 0.828 05/05/2022   Lab Results  Component Value Date   CHOL 186 12/31/2021   HDL 46 12/31/2021   LDLCALC 113 (H) 12/31/2021   TRIG 155 (H) 12/31/2021   CHOLHDL 3.2 06/19/2020   Lab Results  Component Value Date   VD25OH 27.3 (L) 12/31/2021   Lab Results  Component Value Date   WBC 5.8 09/15/2022   HGB 17.3 09/15/2022   HCT 53.5 (H) 09/15/2022   MCV 87 09/15/2022   PLT 273 09/15/2022   Lab Results  Component Value Date   FERRITIN 136.6 05/10/2022   Attestation Statements:   Reviewed by clinician on day of visit: allergies, medications, problem list, medical history, surgical history, family history, social history, and previous encounter notes.  I, Christopher Wilkins, am acting as transcriptionist for [employer], [employer].  I have reviewed the above documentation for accuracy and completeness, and I agree with the above. -  Christopher Ashe,PA-C

## 2022-10-30 ENCOUNTER — Other Ambulatory Visit (INDEPENDENT_AMBULATORY_CARE_PROVIDER_SITE_OTHER): Payer: Self-pay | Admitting: Physician Assistant

## 2022-10-30 DIAGNOSIS — I509 Heart failure, unspecified: Secondary | ICD-10-CM

## 2022-11-01 ENCOUNTER — Other Ambulatory Visit (INDEPENDENT_AMBULATORY_CARE_PROVIDER_SITE_OTHER): Payer: Self-pay | Admitting: Physician Assistant

## 2022-11-01 ENCOUNTER — Other Ambulatory Visit: Payer: Self-pay | Admitting: Registered Nurse

## 2022-11-01 DIAGNOSIS — I509 Heart failure, unspecified: Secondary | ICD-10-CM

## 2022-11-01 DIAGNOSIS — F17210 Nicotine dependence, cigarettes, uncomplicated: Secondary | ICD-10-CM

## 2022-11-11 ENCOUNTER — Ambulatory Visit (INDEPENDENT_AMBULATORY_CARE_PROVIDER_SITE_OTHER): Payer: 59 | Admitting: Physician Assistant

## 2022-11-11 ENCOUNTER — Encounter (INDEPENDENT_AMBULATORY_CARE_PROVIDER_SITE_OTHER): Payer: Self-pay | Admitting: Physician Assistant

## 2022-11-11 VITALS — BP 133/88 | HR 81 | Temp 98.0°F | Ht 66.0 in | Wt 237.0 lb

## 2022-11-11 DIAGNOSIS — I509 Heart failure, unspecified: Secondary | ICD-10-CM | POA: Diagnosis not present

## 2022-11-11 DIAGNOSIS — E669 Obesity, unspecified: Secondary | ICD-10-CM

## 2022-11-11 DIAGNOSIS — E1169 Type 2 diabetes mellitus with other specified complication: Secondary | ICD-10-CM | POA: Diagnosis not present

## 2022-11-11 DIAGNOSIS — Z7984 Long term (current) use of oral hypoglycemic drugs: Secondary | ICD-10-CM

## 2022-11-11 DIAGNOSIS — E6609 Other obesity due to excess calories: Secondary | ICD-10-CM

## 2022-11-11 DIAGNOSIS — Z7985 Long-term (current) use of injectable non-insulin antidiabetic drugs: Secondary | ICD-10-CM

## 2022-11-11 DIAGNOSIS — Z6838 Body mass index (BMI) 38.0-38.9, adult: Secondary | ICD-10-CM

## 2022-11-11 NOTE — Progress Notes (Deleted)
Office: [phone]  /  Fax: [phone]  WEIGHT SUMMARY AND BIOMETRICS  Medical Weight Loss Height: 5' 6"$  (1.676 m) Weight: 237 lb (107.5 kg) Temp: 98 F (36.7 C) Pulse Rate: 81 BP: 133/88 SpO2: 97 %  Body Fat %: 33.4 % Fat Mass (lbs): 79.4 lbs Muscle Mass (lbs): 150.4 lbs Total Body Water (lbs): 102.4 lbs Visceral Fat Rating : 22    HPI  Chief Complaint: OBESITY  Camerino is here to discuss his progress with his obesity treatment plan. He is on the the Category 3 Plan and states he is following his eating plan approximately 75 % of the time. He states he is exercising 30 minutes 7 times per week.   Interval History:  Since last office he reports adherence to prescribed reduced calorie healthy diet is good. Denies problems with appetite and hunger signals.  Denies problems with satiety and satiation.  Denies problems with eating patterns and portion control.  Sleeping approximately 7 hours a day.  Sleep described as restorative.  Stress levels are reported as low and manageable.  Barriers identified none. Tremon has done well with weight loss overall.  He feels like he has been more mindful and doing better with portion control, but has been limiting his calories at lunchtime and sometimes eating only fruits. We discussed making sure he has some protein at lunch and provided Smart fruit choices handout for fruits with lower glycemic index.   Pharmacotherapy: Ozempic 0.25 mg weekly. No side effects. Patient does not want to increase dose as he feels he has good appetite control on very low dose Ozempic.  PHYSICAL EXAM:  Blood pressure 133/88, pulse 81, temperature 98 F (36.7 C), height 5' 6"$  (1.676 m), weight 237 lb (107.5 kg), SpO2 97 %. Body mass index is 38.25 kg/m.  General: He is overweight, cooperative, alert, well developed, and in no acute distress. PSYCH: Has normal mood, affect and thought process.   HEENT: EOMI, sclerae are anicteric. Lungs: Normal  breathing effort, no conversational dyspnea. Extremities: No edema.  Neurologic: No gross sensory or motor deficits. No tremors or fasciculations noted.    DIAGNOSTIC DATA REVIEWED:  BMET    Component Value Date/Time   NA 143 09/15/2022 0846   K 3.4 (L) 09/15/2022 0846   CL 102 09/15/2022 0846   CO2 25 09/15/2022 0846   GLUCOSE 93 09/15/2022 0846   GLUCOSE 109 (H) 06/23/2022 0647   BUN 16 09/15/2022 0846   CREATININE 1.22 09/15/2022 0846   CALCIUM 9.4 09/15/2022 0846   GFRNONAA 57 (L) 06/23/2022 0647   GFRAA 79 07/11/2020 1002   Lab Results  Component Value Date   HGBA1C 6.5 (H) 12/31/2021   HGBA1C 5.8 (H) 06/19/2020   Lab Results  Component Value Date   INSULIN 71.9 (H) 12/31/2021   Lab Results  Component Value Date   TSH 0.828 05/05/2022   CBC    Component Value Date/Time   WBC 5.8 09/15/2022 0846   WBC 5.8 06/23/2022 0647   RBC 6.18 (H) 09/15/2022 0846   RBC 5.59 06/23/2022 0647   HGB 17.3 09/15/2022 0846   HCT 53.5 (H) 09/15/2022 0846   PLT 273 09/15/2022 0846   MCV 87 09/15/2022 0846   MCH 28.0 09/15/2022 0846   MCH 27.7 06/23/2022 0647   MCHC 32.3 09/15/2022 0846   MCHC 32.9 06/23/2022 0647   RDW 15.4 09/15/2022 0846   Iron Studies    Component Value Date/Time   FERRITIN 136.6 05/10/2022 1041   Lipid  Panel     Component Value Date/Time   CHOL 186 12/31/2021 0839   TRIG 155 (H) 12/31/2021 0839   HDL 46 12/31/2021 0839   CHOLHDL 3.2 06/19/2020 0120   VLDL 9 06/19/2020 0120   LDLCALC 113 (H) 12/31/2021 0839   Hepatic Function Panel     Component Value Date/Time   PROT 7.2 09/15/2022 0846   ALBUMIN 4.2 09/15/2022 0846   AST 84 (H) 09/15/2022 0846   ALT 130 (H) 09/15/2022 0846   ALKPHOS 104 09/15/2022 0846   BILITOT 0.6 09/15/2022 0846   BILIDIR <0.1 04/07/2018 1048   IBILI NOT CALCULATED 04/07/2018 1048      Component Value Date/Time   TSH 0.828 05/05/2022 0847   Nutritional Lab Results  Component Value Date   VD25OH 27.3 (L)  12/31/2021     ASSESSMENT AND PLAN  TREATMENT PLAN FOR OBESITY:  Recommended Dietary Goals  Jarryd is currently in the action stage of change. As such, his goal is to continue weight management plan. He has agreed to the Category 3 Plan.  Behavioral Intervention  We discussed the following Behavioral Modification Strategies today: increasing lean protein intake and decreasing simple carbohydrates .  Additional resources provided today: NA  Recommended Physical Activity Goals  Fenton has been advised to work up to 150 minutes of moderate intensity aerobic activity a week and strengthening exercises 2-3 times per week for cardiovascular health, weight loss maintenance and preservation of muscle mass.   He has agreed to increase physical activity in their day and reduce sedentary time (increase NEAT).  and Will continue regular aerobic exercise 30 minutes, 7 times per week. Chosen activity walking.   Pharmacotherapy We discussed various medication options to help Keiran with his weight loss efforts and we both agreed to continue the low dose Ozempic. He will need follow up fasting labs at some point over the next 1-2 months to reassess his A1c, insulin, lipids and liver functions.   ASSOCIATED CONDITIONS ADDRESSED TODAY  Type 2 diabetes mellitus with other specified complication, unspecified whether long term insulin use (HCC)  Congestive heart failure, unspecified HF chronicity, unspecified heart failure type ([place])  Obesity, current BMI 38.3      Return in about 3 weeks (around 12/02/2022).Marland Kitchen He was informed of the importance of frequent follow up visits to maximize his success with intensive lifestyle modifications for his multiple health conditions.   ATTESTASTION STATEMENTS:  Reviewed by clinician on day of visit: allergies, medications, problem list, medical history, surgical history, family history, social history, and previous encounter notes.   Time spent on visit  including pre-visit chart review and post-visit care and charting was 35 minutes.  Valborg Friar,PA-C

## 2022-11-17 NOTE — Progress Notes (Addendum)
Chief Complaint:   OBESITY Christopher Wilkins is here to discuss his progress with his obesity treatment plan along with follow-up of his obesity related diagnoses. Christopher Wilkins is on the Category 3 Plan and states he is following his eating plan approximately 75% of the time. Christopher Wilkins states he is walking 30+ minutes 7 times per week.  Today's visit was #: 12 Starting weight: 248 lbs Starting date: 12/31/2021 Today's weight: 237 lbs Today's date: 11/11/2022 Total lbs lost to date: 9 lbs Total lbs lost since last in-office visit: 2   Interval History:  Since last office he reports adherence to prescribed reduced calorie healthy diet is good. Denies problems with appetite and hunger signals.  Denies problems with satiety and satiation.  Denies problems with eating patterns and portion control.  Sleeping approximately 7 hours a day.  Sleep described as restorative.  Stress levels are reported as low and manageable.  Barriers identified none. Christopher Wilkins has done well with weight loss overall.  He feels like he has been more mindful and doing better with portion control, but has been limiting his calories at lunchtime and sometimes eating only fruits. We discussed making sure he has some protein at lunch and provided Smart fruit choices handout for fruits with lower glycemic index.    Pharmacotherapy: Ozempic 0.25 mg weekly. No side effects. Patient does not want to increase dose as he feels he has good appetite control on very low dose Ozempic.  Subjective:   1. Type 2 diabetes mellitus with other specified complication, unspecified whether long term insulin use (HCC) A1c 6.5-On Metformin 500 mg daily/ Farxiga 10 mg daily/ Ozempic 0.25 mg weekly.  Fasting blood sugars 80-120s.  Does not to increase dose of GLP-1.  Just picked up Ozempic-no refills needed.  2. Congestive heart failure, unspecified HF chronicity, unspecified heart failure type [facility]) Following [facility] with [facility].  On Entresto. HF  appears well compensated currently.  Watching sodium in foods, reports drinking a lot of water.  Assessment/Plan:   1. Type 2 diabetes mellitus with other specified complication, unspecified whether long term insulin use (HCC) Continue medications.  Continue Prescribed Nutrition Plan and exercise to promote weight loss and improve glycemic control.  Will obtain labs again over the next 1-2 months.  2. Congestive heart failure, unspecified HF chronicity, unspecified heart failure type ([place]) Advised to try to measure water intake per day over the next few weeks.  Appears well compensated currently, no signs of peripheral edema, SOB or other signs of CHF.  Following closely with cardiology.  3. Obesity, current BMI 38.3 Christopher Wilkins is currently in the action stage of change. As such, his goal is to continue with weight loss efforts. He has agreed to the Category 3 Plan.   Exercise goals: As is.  Behavioral modification strategies: increasing lean protein intake, decreasing simple carbohydrates, and meal planning and cooking strategies.  Christopher Wilkins has agreed to follow-up with our clinic in 3 weeks. He was informed of the importance of frequent follow-up visits to maximize his success with intensive lifestyle modifications for his multiple health conditions.   Objective:   Blood pressure 133/88, pulse 81, temperature 98 F (36.7 C), height 5' 6"$  (1.676 m), weight 237 lb (107.5 kg), SpO2 97 %. Body mass index is 38.25 kg/m.  General: Cooperative, alert, well developed, in no acute distress. HEENT: Conjunctivae and lids unremarkable. Cardiovascular: Regular rhythm.  Lungs: Normal work of breathing. Neurologic: No focal deficits.   Lab Results  Component Value Date  CREATININE 1.22 09/15/2022   BUN 16 09/15/2022   NA 143 09/15/2022   K 3.4 (L) 09/15/2022   CL 102 09/15/2022   CO2 25 09/15/2022   Lab Results  Component Value Date   ALT 130 (H) 09/15/2022   AST 84 (H) 09/15/2022   ALKPHOS  104 09/15/2022   BILITOT 0.6 09/15/2022   Lab Results  Component Value Date   HGBA1C 6.5 (H) 12/31/2021   HGBA1C 5.9 (H) 08/17/2020   HGBA1C 5.8 (H) 06/19/2020   Lab Results  Component Value Date   INSULIN 71.9 (H) 12/31/2021   Lab Results  Component Value Date   TSH 0.828 05/05/2022   Lab Results  Component Value Date   CHOL 186 12/31/2021   HDL 46 12/31/2021   LDLCALC 113 (H) 12/31/2021   TRIG 155 (H) 12/31/2021   CHOLHDL 3.2 06/19/2020   Lab Results  Component Value Date   VD25OH 27.3 (L) 12/31/2021   Lab Results  Component Value Date   WBC 5.8 09/15/2022   HGB 17.3 09/15/2022   HCT 53.5 (H) 09/15/2022   MCV 87 09/15/2022   PLT 273 09/15/2022   Lab Results  Component Value Date   FERRITIN 136.6 05/10/2022   Attestation Statements:   Reviewed by clinician on day of visit: allergies, medications, problem list, medical history, surgical history, family history, social history, and previous encounter notes.  I, Brendell Tyus, am acting as transcriptionist for [employer], [employer].  I have reviewed the above documentation for accuracy and completeness, and I agree with the above. -  Haliegh Khurana,PA-C

## 2022-11-18 ENCOUNTER — Encounter (HOSPITAL_COMMUNITY): Payer: Self-pay | Admitting: *Deleted

## 2022-11-26 ENCOUNTER — Ambulatory Visit
Admission: RE | Admit: 2022-11-26 | Discharge: 2022-11-26 | Disposition: A | Discharge status: Home / Self Care | Payer: 59 | Source: Ambulatory Visit | Attending: Registered Nurse | Admitting: Registered Nurse

## 2022-11-26 DIAGNOSIS — F17210 Nicotine dependence, cigarettes, uncomplicated: Secondary | ICD-10-CM

## 2022-12-02 ENCOUNTER — Encounter (INDEPENDENT_AMBULATORY_CARE_PROVIDER_SITE_OTHER): Payer: Self-pay | Admitting: Physician Assistant

## 2022-12-02 ENCOUNTER — Ambulatory Visit (INDEPENDENT_AMBULATORY_CARE_PROVIDER_SITE_OTHER): Payer: 59 | Admitting: Physician Assistant

## 2022-12-02 VITALS — BP 117/80 | HR 78 | Temp 97.8°F | Ht 66.0 in | Wt 238.6 lb

## 2022-12-02 DIAGNOSIS — Z6838 Body mass index (BMI) 38.0-38.9, adult: Secondary | ICD-10-CM

## 2022-12-02 DIAGNOSIS — E1169 Type 2 diabetes mellitus with other specified complication: Secondary | ICD-10-CM

## 2022-12-02 DIAGNOSIS — E669 Obesity, unspecified: Secondary | ICD-10-CM | POA: Diagnosis not present

## 2022-12-02 DIAGNOSIS — G4733 Obstructive sleep apnea (adult) (pediatric): Secondary | ICD-10-CM | POA: Diagnosis not present

## 2022-12-02 DIAGNOSIS — Z7985 Long-term (current) use of injectable non-insulin antidiabetic drugs: Secondary | ICD-10-CM

## 2022-12-02 DIAGNOSIS — Z7984 Long term (current) use of oral hypoglycemic drugs: Secondary | ICD-10-CM

## 2022-12-02 DIAGNOSIS — E559 Vitamin D deficiency, unspecified: Secondary | ICD-10-CM

## 2022-12-02 MED ORDER — OZEMPIC (0.25 OR 0.5 MG/DOSE) 2 MG/3ML ~~LOC~~ SOPN: 0.5000 mg | PEN_INJECTOR | SUBCUTANEOUS | 0 refills | Status: DC

## 2022-12-02 NOTE — Progress Notes (Signed)
Chief Complaint:   OBESITY Christopher Wilkins is here to discuss his progress with his obesity treatment plan along with follow-up of his obesity related diagnoses. Christopher Wilkins is on the Category 3 Plan and states he is following his eating plan approximately 100% of the time. Christopher Wilkins states he is walking 30-40 minutes 7 times per week.  Today's visit was #: 32 Starting weight: 248 lbs Starting date: 12/31/2021 Today's weight: 238 lbs Today's date: 12/02/2022 Total lbs lost to date: 8 lbs Total lbs lost since last in-office visit: +1 lb  Interim History: Christopher Wilkins reports he did get off his nutrition plan recently and has been eating some pizza and hot dogs with his grandchildren (ages 67 and 93 years.) Hunger and appetite are controlled.  Feels his cravings are less overall.  He generally goes to bed at 6:30 pm and wakes up about 2:30 am each day. Reports part of his waking up is due to his CPAP machine.  He reports he is due to see his PCP- Christopher Wilkins soon.   Subjective:   1. Type 2 diabetes mellitus with other specified complication, unspecified whether long term insulin use (Christopher Wilkins) He is on Ozempic 0.25 mg weekly. He has been hesitant to increase the dosage. He is not having side effects from the medications and we discussed the risks and benefits and he is agreeable to increase at this time. He is also on metformin 500 mg and Farxiga 10 mg daily without side effects. He reports no hypoglycemia.   He continues to work on his nutrition plan to decrease simple carbohydrates, increase lean proteins and exercise to promote weight loss and improve glycemic control.   2. OSA on CPAP He reports he is able to wear the CPAP for 3-4 hours nightly, but then feels like his mouth is very dry and has to get something to drink and does not resume. We discussed how decreased oxygenation during the night with OSA can effect his metabolism and make weight loss more difficult.   3. Obesity- Start BMI 38.47 4. BMI  38.0-38.9,adult, Current BMI-38.5  Assessment/Plan:   1. Type 2 diabetes mellitus with other specified complication, unspecified whether long term insulin use (HCC) Will increase Ozempic to 0.5 mg weekly. Will monitor closely following increased dose.  He will continue to work on decreasing simple carbohydrates, increasing lean protein and exercise to promote weight loss and improve glycemic control.  REFILL/INCREASE- Semaglutide,0.25 or 0.5MG/DOS, (OZEMPIC, 0.25 OR 0.5 MG/DOSE,) 2 MG/3ML SOPN; Inject 0.5 mg into the skin once a week.  Dispense: 3 mL; Refill: 0  2. OSA on CPAP He is going to follow up with his PCP- Christopher Wilkins for his OSA and CPAP management. Will follow with PCP.   3. Obesity- Start BMI 38.47 4. BMI 38.0-38.9,adult, Current BMI-38.5  Christopher Wilkins is currently in the action stage of change. As such, his goal is to continue with weight loss efforts. He has agreed to the Category 3 Plan.   Exercise goals: All adults should avoid inactivity. Some physical activity is better than none, and adults who participate in any amount of physical activity gain some health benefits.  Behavioral modification strategies: increasing lean protein intake, decreasing simple carbohydrates, no skipping meals, avoiding temptations, and planning for success.  Christopher Wilkins has agreed to follow-up with our clinic in 3 weeks. He was informed of the importance of frequent follow-up visits to maximize his success with intensive lifestyle modifications for his multiple health conditions.  Objective:   Blood pressure 117/80, pulse 78, temperature 97.8 F (36.6 C), height 5' 6"$  (1.676 m), weight 238 lb 9.6 oz (108.2 kg), SpO2 96 %. Body mass index is 38.51 kg/m.  General: Cooperative, alert, well developed, in no acute distress. HEENT: Conjunctivae and lids unremarkable. Cardiovascular: Regular rhythm.  Lungs: Normal work of breathing. Neurologic: No focal deficits.   Lab Results  Component Value  Date   CREATININE 1.22 09/15/2022   BUN 16 09/15/2022   NA 143 09/15/2022   K 3.4 (L) 09/15/2022   CL 102 09/15/2022   CO2 25 09/15/2022   Lab Results  Component Value Date   ALT 130 (H) 09/15/2022   AST 84 (H) 09/15/2022   ALKPHOS 104 09/15/2022   BILITOT 0.6 09/15/2022   Lab Results  Component Value Date   HGBA1C 6.5 (H) 12/31/2021   HGBA1C 5.9 (H) 08/17/2020   HGBA1C 5.8 (H) 06/19/2020   Lab Results  Component Value Date   INSULIN 71.9 (H) 12/31/2021   Lab Results  Component Value Date   TSH 0.828 05/05/2022   Lab Results  Component Value Date   CHOL 186 12/31/2021   HDL 46 12/31/2021   LDLCALC 113 (H) 12/31/2021   TRIG 155 (H) 12/31/2021   CHOLHDL 3.2 06/19/2020   Lab Results  Component Value Date   VD25OH 27.3 (L) 12/31/2021   Lab Results  Component Value Date   WBC 5.8 09/15/2022   HGB 17.3 09/15/2022   HCT 53.5 (H) 09/15/2022   MCV 87 09/15/2022   PLT 273 09/15/2022   Lab Results  Component Value Date   FERRITIN 136.6 05/10/2022    Obesity Behavioral Intervention:   Approximately 15 minutes were spent on the discussion below.  ASK: We discussed the diagnosis of obesity with Christopher Wilkins today and Christopher Wilkins agreed to give [place] permission to discuss obesity behavioral modification therapy today.  ASSESS: Christopher Wilkins has the diagnosis of obesity and his BMI today is 38.5. Christopher Wilkins is in the action stage of change.   ADVISE: Christopher Wilkins was educated on the multiple health risks of obesity as well as the benefit of weight loss to improve his health. He was advised of the need for long term treatment and the importance of lifestyle modifications to improve his current health and to decrease his risk of future health problems.  AGREE: Multiple dietary modification options and treatment options were discussed and Christopher Wilkins agreed to follow the recommendations documented in the above note.  ARRANGE: Christopher Wilkins was educated on the importance of frequent visits to treat obesity as  outlined per CMS and USPSTF guidelines and agreed to schedule his next follow up appointment today.  Attestation Statements:   Reviewed by clinician on day of visit: allergies, medications, problem list, medical history, surgical history, family history, social history, and previous encounter notes.  Time spent on visit including pre-visit chart review and post-visit care and charting was 32 minutes.   Jennaya Pogue,PA-C

## 2022-12-23 ENCOUNTER — Encounter (INDEPENDENT_AMBULATORY_CARE_PROVIDER_SITE_OTHER): Payer: Self-pay | Admitting: Physician Assistant

## 2022-12-23 ENCOUNTER — Ambulatory Visit (INDEPENDENT_AMBULATORY_CARE_PROVIDER_SITE_OTHER): Payer: 59 | Admitting: Physician Assistant

## 2022-12-23 VITALS — BP 122/82 | HR 77 | Temp 98.1°F | Ht 66.0 in | Wt 237.0 lb

## 2022-12-23 DIAGNOSIS — I509 Heart failure, unspecified: Secondary | ICD-10-CM | POA: Diagnosis not present

## 2022-12-23 DIAGNOSIS — E669 Obesity, unspecified: Secondary | ICD-10-CM | POA: Insufficient documentation

## 2022-12-23 DIAGNOSIS — Z6838 Body mass index (BMI) 38.0-38.9, adult: Secondary | ICD-10-CM

## 2022-12-23 DIAGNOSIS — E559 Vitamin D deficiency, unspecified: Secondary | ICD-10-CM

## 2022-12-23 DIAGNOSIS — Z6837 Body mass index (BMI) 37.0-37.9, adult: Secondary | ICD-10-CM | POA: Insufficient documentation

## 2022-12-23 DIAGNOSIS — E1169 Type 2 diabetes mellitus with other specified complication: Secondary | ICD-10-CM | POA: Diagnosis not present

## 2022-12-23 DIAGNOSIS — E7849 Other hyperlipidemia: Secondary | ICD-10-CM | POA: Diagnosis not present

## 2022-12-23 DIAGNOSIS — Z7985 Long-term (current) use of injectable non-insulin antidiabetic drugs: Secondary | ICD-10-CM

## 2022-12-23 DIAGNOSIS — Z6835 Body mass index (BMI) 35.0-35.9, adult: Secondary | ICD-10-CM | POA: Insufficient documentation

## 2022-12-23 MED ORDER — ALBUTEROL SULFATE HFA 108 (90 BASE) MCG/ACT IN AERS: 1.0000 | INHALATION_SPRAY | Freq: Three times a day (TID) | RESPIRATORY_TRACT | 0 refills | Status: DC | PRN

## 2022-12-23 NOTE — Assessment & Plan Note (Addendum)
Vitamin D Deficiency Vitamin D is not at goal of 50.  Most recent vitamin D level was 27.3. He is on OTC vitamin D3 2000 IU daily. No side effects with Vit D.  Lab Results  Component Value Date   VD25OH 27.3 (L) 12/31/2021    Plan: Continue  vitamin D 2000 IU daily.  Recheck Vit D level today.

## 2022-12-23 NOTE — Assessment & Plan Note (Addendum)
Type 2 Diabetes Mellitus with other specified complication, without long-term current use of insulin HgbA1c is at goal. Last A1c was 6.5 CBGs: Fasting 82-107 Episodes of hypoglycemia: no Medication(s): Ozempic 0.5 mg SQ weekly No side effects with Ozempic.   Lab Results  Component Value Date   HGBA1C 6.5 (H) 12/31/2021   HGBA1C 5.9 (H) 08/17/2020   HGBA1C 5.8 (H) 06/19/2020   Lab Results  Component Value Date   MICROALBUR 0.84 04/03/2010   LDLCALC 113 (H) 12/31/2021   CREATININE 1.22 09/15/2022   No results found for: "GFR"  Plan: Continue Ozempic 0.5 mg SQ weekly He stated he did not need refill this visit.  Continue working on nutrition plan to decrease simple carbohydrates, increase lean proteins and exercise to promote weight loss,and improve glycemic control. Fasting today for labs

## 2022-12-23 NOTE — Progress Notes (Signed)
Office: [phone]  /  Fax: [phone]  WEIGHT SUMMARY AND BIOMETRICS  Vitals Temp: 98.1 F (36.7 C) BP: 122/82 Pulse Rate: 77 SpO2: 96 %   Anthropometric Measurements Height: '5\' 6"'$  (1.676 m) Weight: 237 lb (107.5 kg) BMI (Calculated): 38.27 Weight at Last Visit: 238 lb Weight Lost Since Last Visit: 1 lb Starting Weight: 253 lb Total Weight Loss (lbs): 9 lb (4.082 kg)   Body Composition  Body Fat %: 35.7 % Fat Mass (lbs): 84.6 lbs Muscle Mass (lbs): 144.8 lbs Total Body Water (lbs): 102.4 lbs Visceral Fat Rating : 23   Other Clinical Data Labs: no Today's Visit #: 14 Starting Date: 12/31/21     HPI  Chief Complaint: OBESITY  Christopher Wilkins is here to discuss his progress with his obesity treatment plan. He is on the the Category 3 Plan and states he is following his eating plan approximately 85 % of the time. He states he is exercise/walking 30-40 minutes 7 times per week.   Interval History:  Since last office visit he has done well. He is down 1 lb.  He has noted decreased hunger on increased Ozempic 0.5 mg weekly.  He reports not skipping meals but not always eating enough protein and discussed trying to get sufficient protein.  He is eating fruit- Apples, berries, melons and grapes. We discussed trying not to eat grapes regularly due to higher sugar.  He continues to care for and watch his grandchildren after school.   Due to see his PCP at [facility] in follow up soon.  Due to see cardiology in June for follow up of CHF.  Appt with Neuropsychology in August for evaluation of cognition.    Pharmacotherapy: Ozempic 0.5 mg weekly. Reports no side effects. Reports does not need refill until next visit.   PHYSICAL EXAM:  Blood pressure 122/82, pulse 77, temperature 98.1 F (36.7 C), height '5\' 6"'$  (1.676 m), weight 237 lb (107.5 kg), SpO2 96 %. Body mass index is 38.25 kg/m.  General: He is overweight, cooperative, alert, well developed, and in  no acute distress. PSYCH: Has normal mood, affect and thought process.  Some decreased memory noted, but compensates well using phone to relate appts, etc.  Medications are prepackaged and he brings them in with him today for review.  Lungs: Normal breathing effort, no conversational dyspnea.  DIAGNOSTIC DATA REVIEWED:  BMET    Component Value Date/Time   NA 143 09/15/2022 0846   K 3.4 (L) 09/15/2022 0846   CL 102 09/15/2022 0846   CO2 25 09/15/2022 0846   GLUCOSE 93 09/15/2022 0846   GLUCOSE 109 (H) 06/23/2022 0647   BUN 16 09/15/2022 0846   CREATININE 1.22 09/15/2022 0846   CALCIUM 9.4 09/15/2022 0846   GFRNONAA 57 (L) 06/23/2022 0647   GFRAA 79 07/11/2020 1002   Lab Results  Component Value Date   HGBA1C 6.5 (H) 12/31/2021   HGBA1C 5.8 (H) 06/19/2020   Lab Results  Component Value Date   INSULIN 71.9 (H) 12/31/2021   Lab Results  Component Value Date   TSH 0.828 05/05/2022   CBC    Component Value Date/Time   WBC 5.8 09/15/2022 0846   WBC 5.8 06/23/2022 0647   RBC 6.18 (H) 09/15/2022 0846   RBC 5.59 06/23/2022 0647   HGB 17.3 09/15/2022 0846   HCT 53.5 (H) 09/15/2022 0846   PLT 273 09/15/2022 0846   MCV 87 09/15/2022 0846   MCH 28.0 09/15/2022 0846   MCH 27.7  06/23/2022 0647   MCHC 32.3 09/15/2022 0846   MCHC 32.9 06/23/2022 0647   RDW 15.4 09/15/2022 0846   Iron Studies    Component Value Date/Time   FERRITIN 136.6 05/10/2022 1041   Lipid Panel     Component Value Date/Time   CHOL 186 12/31/2021 0839   TRIG 155 (H) 12/31/2021 0839   HDL 46 12/31/2021 0839   CHOLHDL 3.2 06/19/2020 0120   VLDL 9 06/19/2020 0120   LDLCALC 113 (H) 12/31/2021 0839   Hepatic Function Panel     Component Value Date/Time   PROT 7.2 09/15/2022 0846   ALBUMIN 4.2 09/15/2022 0846   AST 84 (H) 09/15/2022 0846   ALT 130 (H) 09/15/2022 0846   ALKPHOS 104 09/15/2022 0846   BILITOT 0.6 09/15/2022 0846   BILIDIR <0.1 04/07/2018 1048   IBILI NOT CALCULATED 04/07/2018  1048      Component Value Date/Time   TSH 0.828 05/05/2022 0847   Nutritional Lab Results  Component Value Date   VD25OH 27.3 (L) 12/31/2021    ASSOCIATED CONDITIONS ADDRESSED TODAY  ASSESSMENT AND PLAN  Problem List Items Addressed This Visit     CHF (congestive heart failure) ([place])    On Entresto. HF appears well compensated currently.  Reports no SOB, but does feels like he has some wheezing at the end of the day at times, may be related to COPD. Saw PCP at [facility] and had Chest CT as surveillance for lung ca- Chest CT on 11/26/22 IMPRESSION: 1. Lung-RADS 2S, benign appearance or behavior. Continue annual screening with low-dose chest CT without contrast in 12 months. 2. The "S" modifier above refers to potentially clinically significant non lung cancer related findings. Specifically, there is aortic atherosclerosis, in addition to left main and left anterior descending coronary artery disease. Please note that although the presence of coronary artery calcium documents the presence of coronary artery disease, the severity of this disease and any potential stenosis cannot be assessed on this non-gated CT examination. Assessment for potential risk factor modification, dietary therapy or pharmacologic therapy may be warranted, if clinically indicated. 3. Mild diffuse bronchial wall thickening with very mild centrilobular and paraseptal emphysema; imaging findings suggestive of underlying COPD. 4. There are calcifications of the aortic valve and mitral valve/annulus. Echocardiographic correlation for evaluation of potential valvular dysfunction may be warranted if clinically indicated.  Aortic Atherosclerosis (ICD10-I70.0) and Emphysema (ICD10-J43.9).  Continue watching sodium in foods, reports drinking a lot of water and advised to keep log of amounts he is drinking .   HF appears well compensated currently, no signs of peripheral edema, SOB or other signs of  CHF.   Following closely with cardiology and discussed he should see them again in May or June for 6 months follow up as advised.        Relevant Medications   apixaban (ELIQUIS) 5 MG TABS tablet   albuterol (VENTOLIN HFA) 108 (90 Base) MCG/ACT inhaler   Type 2 diabetes mellitus with other specified complication ([place]) - Primary    Type 2 Diabetes Mellitus with other specified complication, without long-term current use of insulin HgbA1c is at goal. Last A1c was 6.5 CBGs: Fasting 82-107 Episodes of hypoglycemia: no Medication(s): Ozempic 0.5 mg SQ weekly  Lab Results  Component Value Date   HGBA1C 6.5 (H) 12/31/2021   HGBA1C 5.9 (H) 08/17/2020   HGBA1C 5.8 (H) 06/19/2020   Lab Results  Component Value Date   MICROALBUR 0.84 04/03/2010   LDLCALC 113 (H)  12/31/2021   CREATININE 1.22 09/15/2022  No results found for: "GFR"  Plan: Continue Ozempic 0.5 mg SQ weekly He stated he did not need refill this visit.  Continue working on nutrition plan to decrease simple carbohydrates, increase lean proteins and exercise to promote weight loss,and improve glycemic control. Fasting today for labs        Relevant Orders   CMP14+EGFR   Hemoglobin A1c   Insulin, random   Vitamin D deficiency    Vitamin D Deficiency Vitamin D is not at goal of 50.  Most recent vitamin D level was 27.3. He is on OTC vitamin D3 2000 IU daily. Lab Results  Component Value Date   VD25OH 27.3 (L) 12/31/2021   Plan: Continue  vitamin D 2000 IU daily.  Recheck Vit D level today.        Relevant Orders   VITAMIN D 25 Hydroxy (Vit-D Deficiency, Fractures)   Other hyperlipidemia    Hyperlipidemia LDL is not at goal. Medication(s): Lipitor 80 mg daily. No side effects with statin.  Cardiovascular risk factors: advanced age (older than 55 for men, 50 for women), diabetes mellitus, dyslipidemia, hypertension, and obesity (BMI >= 30 kg/m2)  Lab Results  Component Value Date   CHOL 186 12/31/2021   HDL  46 12/31/2021   LDLCALC 113 (H) 12/31/2021   TRIG 155 (H) 12/31/2021   CHOLHDL 3.2 06/19/2020   CHOLHDL 4.3 Ratio 04/03/2010   CHOLHDL 3.6 Ratio 08/08/2009   Lab Results  Component Value Date   ALT 130 (H) 09/15/2022   AST 84 (H) 09/15/2022   ALKPHOS 104 09/15/2022   BILITOT 0.6 09/15/2022  The 10-year ASCVD risk score (Arnett DK, et al., 2019) is: 43.5%   Values used to calculate the score:     Age: 67 years     Sex: Male     Is Non-Hispanic African American: Yes     Diabetic: Yes     Tobacco smoker: Yes     Systolic Blood Pressure: 123XX123 mmHg     Is BP treated: Yes     HDL Cholesterol: 46 mg/dL     Total Cholesterol: 186 mg/dL  Plan: Continue statin. Will recheck fasting lipid panel today.          Relevant Medications   apixaban (ELIQUIS) 5 MG TABS tablet   Other Relevant Orders   Lipid Panel With LDL/HDL Ratio   Obesity- Start BMI 38.47   BMI 38.0-38.9,adult, Current BMI-38.3      TREATMENT PLAN FOR OBESITY:  Recommended Dietary Goals  Christopher Wilkins is currently in the action stage of change. As such, his goal is to continue weight management plan. He has agreed to the Category 3 Plan.  Behavioral Intervention  We discussed the following Behavioral Modification Strategies today: increasing lean protein intake, decreasing simple carbohydrates , increasing lower glycemic fruits, avoiding skipping meals, work on meal planning and easy cooking plans, and decreasing sodium intake.  Additional resources provided today: NA  Recommended Physical Activity Goals  Christopher Wilkins has been advised to work up to 150 minutes of moderate intensity aerobic activity a week and strengthening exercises 2-3 times per week for cardiovascular health, weight loss maintenance and preservation of muscle mass.   He has agreed to Continue current level of physical activity    Pharmacotherapy We discussed various medication options to help Christopher Wilkins with his weight loss efforts and we both agreed to  continue Ozempic 0.5 mg weekly for Type 2 diabetes.    Return in about  4 weeks (around 01/20/2023).Marland Kitchen He was informed of the importance of frequent follow up visits to maximize his success with intensive lifestyle modifications for his multiple health conditions.   ATTESTASTION STATEMENTS:  Reviewed by clinician on day of visit: allergies, medications, problem list, medical history, surgical history, family history, social history, and previous encounter notes.   I have personally spent 30 minutes total time today in preparation, patient care, nutritional counseling and documentation for this visit, including the following: review of clinical lab tests; review of medical tests/procedures/services.      Koya Hunger, PA-C

## 2022-12-23 NOTE — Assessment & Plan Note (Signed)
Hyperlipidemia LDL is not at goal. Medication(s): Lipitor 80 mg daily. No side effects with statin.  Cardiovascular risk factors: advanced age (older than 13 for men, 55 for women), diabetes mellitus, dyslipidemia, hypertension, and obesity (BMI >= 30 kg/m2)  Lab Results  Component Value Date   CHOL 186 12/31/2021   HDL 46 12/31/2021   LDLCALC 113 (H) 12/31/2021   TRIG 155 (H) 12/31/2021   CHOLHDL 3.2 06/19/2020   CHOLHDL 4.3 Ratio 04/03/2010   CHOLHDL 3.6 Ratio 08/08/2009   Lab Results  Component Value Date   ALT 130 (H) 09/15/2022   AST 84 (H) 09/15/2022   ALKPHOS 104 09/15/2022   BILITOT 0.6 09/15/2022   The 10-year ASCVD risk score (Arnett DK, et al., 2019) is: 43.5%   Values used to calculate the score:     Age: 67 years     Sex: Male     Is Non-Hispanic African American: Yes     Diabetic: Yes     Tobacco smoker: Yes     Systolic Blood Pressure: 123XX123 mmHg     Is BP treated: Yes     HDL Cholesterol: 46 mg/dL     Total Cholesterol: 186 mg/dL  Plan: Continue statin. Will recheck fasting lipid panel today.

## 2022-12-23 NOTE — Assessment & Plan Note (Signed)
On Entresto. HF appears well compensated currently.  Reports no SOB, but does feels like he has some wheezing at the end of the day at times, may be related to COPD. Saw PCP at [facility] and had Chest CT as surveillance for lung ca- Chest CT on 11/26/22 IMPRESSION: 1. Lung-RADS 2S, benign appearance or behavior. Continue annual screening with low-dose chest CT without contrast in 12 months. 2. The "S" modifier above refers to potentially clinically significant non lung cancer related findings. Specifically, there is aortic atherosclerosis, in addition to left main and left anterior descending coronary artery disease. Please note that although the presence of coronary artery calcium documents the presence of coronary artery disease, the severity of this disease and any potential stenosis cannot be assessed on this non-gated CT examination. Assessment for potential risk factor modification, dietary therapy or pharmacologic therapy may be warranted, if clinically indicated. 3. Mild diffuse bronchial wall thickening with very mild centrilobular and paraseptal emphysema; imaging findings suggestive of underlying COPD. 4. There are calcifications of the aortic valve and mitral valve/annulus. Echocardiographic correlation for evaluation of potential valvular dysfunction may be warranted if clinically indicated.  Aortic Atherosclerosis (ICD10-I70.0) and Emphysema (ICD10-J43.9).  Continue watching sodium in foods, reports drinking a lot of water and advised to keep log of amounts he is drinking .   HF appears well compensated currently, no signs of peripheral edema, SOB or other signs of CHF.   Following closely with cardiology and discussed he should see them again in May or June for 6 months follow up as advised.

## 2022-12-24 LAB — LIPID PANEL WITH LDL/HDL RATIO
Cholesterol, Total: 175 mg/dL (ref 100–199)
HDL: 50 mg/dL (ref >39)
LDL Chol Calc (NIH): 106 mg/dL — ABNORMAL HIGH (ref 0–99)
LDL/HDL Ratio: 2.1 ratio (ref 0.0–3.6)
Triglycerides: 104 mg/dL (ref 0–149)
VLDL Cholesterol Cal: 19 mg/dL (ref 5–40)

## 2022-12-24 LAB — HEMOGLOBIN A1C
Est. average glucose Bld gHb Est-mCnc: 126 mg/dL
Hgb A1c MFr Bld: 6 % — ABNORMAL HIGH (ref 4.8–5.6)

## 2022-12-24 LAB — CMP14+EGFR
ALT: 75 IU/L — ABNORMAL HIGH (ref 0–44)
AST: 53 IU/L — ABNORMAL HIGH (ref 0–40)
Albumin/Globulin Ratio: 1.4 (ref 1.2–2.2)
Albumin: 4.1 g/dL (ref 3.9–4.9)
Alkaline Phosphatase: 107 IU/L (ref 44–121)
BUN/Creatinine Ratio: 13 (ref 10–24)
BUN: 16 mg/dL (ref 8–27)
Bilirubin Total: 0.3 mg/dL (ref 0.0–1.2)
CO2: 23 mmol/L (ref 20–29)
Calcium: 9.2 mg/dL (ref 8.6–10.2)
Chloride: 105 mmol/L (ref 96–106)
Creatinine, Ser: 1.24 mg/dL (ref 0.76–1.27)
Globulin, Total: 3 g/dL (ref 1.5–4.5)
Glucose: 89 mg/dL (ref 70–99)
Potassium: 4.6 mmol/L (ref 3.5–5.2)
Sodium: 144 mmol/L (ref 134–144)
Total Protein: 7.1 g/dL (ref 6.0–8.5)
eGFR: 64 mL/min/{1.73_m2} (ref >59)

## 2022-12-24 LAB — INSULIN, RANDOM: INSULIN: 25.4 u[IU]/mL — ABNORMAL HIGH (ref 2.6–24.9)

## 2022-12-24 LAB — VITAMIN D 25 HYDROXY (VIT D DEFICIENCY, FRACTURES): Vit D, 25-Hydroxy: 39.7 ng/mL (ref 30.0–100.0)

## 2022-12-30 ENCOUNTER — Other Ambulatory Visit (INDEPENDENT_AMBULATORY_CARE_PROVIDER_SITE_OTHER): Payer: Self-pay | Admitting: Physician Assistant

## 2022-12-30 DIAGNOSIS — E1169 Type 2 diabetes mellitus with other specified complication: Secondary | ICD-10-CM

## 2023-01-10 ENCOUNTER — Encounter: Payer: Self-pay | Admitting: Podiatry

## 2023-01-10 ENCOUNTER — Ambulatory Visit (INDEPENDENT_AMBULATORY_CARE_PROVIDER_SITE_OTHER): Payer: 59 | Admitting: Podiatry

## 2023-01-10 DIAGNOSIS — M79674 Pain in right toe(s): Secondary | ICD-10-CM | POA: Diagnosis not present

## 2023-01-10 DIAGNOSIS — B351 Tinea unguium: Secondary | ICD-10-CM | POA: Diagnosis not present

## 2023-01-10 DIAGNOSIS — M79675 Pain in left toe(s): Secondary | ICD-10-CM | POA: Diagnosis not present

## 2023-01-10 DIAGNOSIS — D689 Coagulation defect, unspecified: Secondary | ICD-10-CM

## 2023-01-10 NOTE — Progress Notes (Signed)
  Subjective:  Patient ID: Christopher Mikes., male    DOB: 12-09-1955,   MRN: [id number]  Chief Complaint  Patient presents with   Follow-up    Patient is here today for his diabetic foot care.    67 y.o. male presents for thickened elongated and painful toenails that he cannot trim himself. Has a history of congestive heart failure and is currently on Eliquis. Relates he has a history of cutting his nails and bleeding and wants to have them professionally trimmed . Denies any other pedal complaints. Denies n/v/f/c.   Past Medical History:  Diagnosis Date   Atrial fibrillation    CHF (congestive heart failure)    Diabetes    Dyspnea    Elevated LFTs    Fatty liver    Hypertension    Insomnia    Prediabetes    Sleep apnea    SOB (shortness of breath)    Tubular adenoma of colon    Vitamin D deficiency     Objective:  Physical Exam: Vascular: DP/PT pulses 2/4 bilateral. CFT <3 seconds. Normal hair growth on digits. No edema.  Skin. No lacerations or abrasions bilateral feet. Nails 1-5 are thickened discolored and elongated with subungual debris.  Musculoskeletal: MMT 5/5 bilateral lower extremities in DF, PF, Inversion and Eversion. Deceased ROM in DF of ankle joint.  Neurological: Sensation intact to light touch.   Assessment:   1. Pain due to onychomycosis of toenails of both feet   2. Coagulation disorder       Plan:  Patient was evaluated and treated and all questions answered. -Discussed and educated patient on  foot care, especially with  regards to the vascular, neurological and musculoskeletal systems.  -Discussed supportive shoes at all times and checking feet regularly.  -Mechanically debrided all nails 1-5 bilateral using sterile nail nipper and filed with dremel without incident  -Answered all patient questions -Patient to return  in 3 months for at risk foot care -Patient advised to call the office if any problems or questions arise in the  meantime.   Lorenda Peck, DPM

## 2023-01-17 ENCOUNTER — Other Ambulatory Visit (INDEPENDENT_AMBULATORY_CARE_PROVIDER_SITE_OTHER): Payer: Self-pay | Admitting: Physician Assistant

## 2023-01-17 DIAGNOSIS — I509 Heart failure, unspecified: Secondary | ICD-10-CM

## 2023-01-19 ENCOUNTER — Encounter (INDEPENDENT_AMBULATORY_CARE_PROVIDER_SITE_OTHER): Payer: Self-pay | Admitting: Physician Assistant

## 2023-01-19 ENCOUNTER — Ambulatory Visit (INDEPENDENT_AMBULATORY_CARE_PROVIDER_SITE_OTHER): Payer: 59 | Admitting: Physician Assistant

## 2023-01-19 VITALS — BP 129/89 | HR 76 | Temp 98.0°F | Ht 66.0 in | Wt 238.0 lb

## 2023-01-19 DIAGNOSIS — K760 Fatty (change of) liver, not elsewhere classified: Secondary | ICD-10-CM | POA: Diagnosis not present

## 2023-01-19 DIAGNOSIS — E1169 Type 2 diabetes mellitus with other specified complication: Secondary | ICD-10-CM

## 2023-01-19 DIAGNOSIS — E7849 Other hyperlipidemia: Secondary | ICD-10-CM

## 2023-01-19 DIAGNOSIS — E559 Vitamin D deficiency, unspecified: Secondary | ICD-10-CM

## 2023-01-19 DIAGNOSIS — E669 Obesity, unspecified: Secondary | ICD-10-CM

## 2023-01-19 DIAGNOSIS — Z7985 Long-term (current) use of injectable non-insulin antidiabetic drugs: Secondary | ICD-10-CM

## 2023-01-19 DIAGNOSIS — Z6838 Body mass index (BMI) 38.0-38.9, adult: Secondary | ICD-10-CM

## 2023-01-19 MED ORDER — OZEMPIC (0.25 OR 0.5 MG/DOSE) 2 MG/3ML ~~LOC~~ SOPN: 0.5000 mg | PEN_INJECTOR | SUBCUTANEOUS | 0 refills | Status: DC

## 2023-01-19 NOTE — Progress Notes (Signed)
Office: [phone]  /  Fax: [phone]  WEIGHT SUMMARY AND BIOMETRICS  Vitals Temp: 98 F (36.7 C) BP: 129/89 Pulse Rate: 76 SpO2: 98 %   Anthropometric Measurements Height: 5\' 6"  (1.676 m) Weight: 238 lb (108 kg) BMI (Calculated): 38.43 Weight at Last Visit: 237 lb Weight Lost Since Last Visit: 0 lb Weight Gained Since Last Visit: 1 lb Starting Weight: 253 lb Total Weight Loss (lbs): 8 lb (3.629 kg)   Body Composition  Body Fat %: 35.7 % Fat Mass (lbs): 85 lbs Muscle Mass (lbs): 145.4 lbs Total Body Water (lbs): 104 lbs Visceral Fat Rating : 23   Other Clinical Data Fasting: no Labs: no Today's Visit #: 15 Starting Date: 12/11/21     HPI  Chief Complaint: OBESITY  Christopher Wilkins is here to discuss his progress with his obesity treatment plan. He is on the the Category 3 Plan and states he is following his eating plan approximately 80 % of the time. He states he is exercising/walking 45-60 minutes 7 times per week.   Interval History:  Since last office visit he is up 1 lb. Hunger/appetite are well controlled.  Breakfast- Egg/sausage on bread Lunch- fruit and [place] sandwich Dinner- [place] sandwich and fruit  Reports grand kids have been visiting their mom in [place] ( she is in [place] [employer] [place] for the past couple of months and he is missing them.   Reports problems sleeping due to tinnitus and we discussed trying to use a white noise machine at night time to help .  Right shoulder pain- Reports he is going to go in and have it evaluated soon.    Pharmacotherapy: Ozempic 0.5 mg weekly. No side effects with Ozempic.  Have not increased dose significantly as patient does report decreased appetite at times already.   PHYSICAL EXAM:  Blood pressure 129/89, pulse 76, temperature 98 F (36.7 C), height 5\' 6"  (1.676 m), weight 238 lb (108 kg), SpO2 98 %. Body mass index is 38.41 kg/m.  General: He is overweight, cooperative, alert, well developed, and in no acute  distress. PSYCH: Has normal mood, affect and thought process.   Cardiovascular: HR 70's, no pedal edema Lungs: Normal breathing effort, no conversational dyspnea. Neuro: no focal deficits  DIAGNOSTIC DATA REVIEWED:  BMET    Component Value Date/Time   NA 144 12/23/2022 0910   K 4.6 12/23/2022 0910   CL 105 12/23/2022 0910   CO2 23 12/23/2022 0910   GLUCOSE 89 12/23/2022 0910   GLUCOSE 109 (H) 06/23/2022 0647   BUN 16 12/23/2022 0910   CREATININE 1.24 12/23/2022 0910   CALCIUM 9.2 12/23/2022 0910   GFRNONAA 57 (L) 06/23/2022 0647   GFRAA 79 07/11/2020 1002   Lab Results  Component Value Date   HGBA1C 6.0 (H) 12/23/2022   HGBA1C 5.8 (H) 06/19/2020   Lab Results  Component Value Date   INSULIN 25.4 (H) 12/23/2022   INSULIN 71.9 (H) 12/31/2021   Lab Results  Component Value Date   TSH 0.828 05/05/2022   CBC    Component Value Date/Time   WBC 5.8 09/15/2022 0846   WBC 5.8 06/23/2022 0647   RBC 6.18 (H) 09/15/2022 0846   RBC 5.59 06/23/2022 0647   HGB 17.3 09/15/2022 0846   HCT 53.5 (H) 09/15/2022 0846   PLT 273 09/15/2022 0846   MCV 87 09/15/2022 0846   MCH 28.0 09/15/2022 0846   MCH 27.7 06/23/2022 0647   MCHC 32.3 09/15/2022 0846   MCHC 32.9 06/23/2022 0647  RDW 15.4 09/15/2022 0846   Iron Studies    Component Value Date/Time   FERRITIN 136.6 05/10/2022 1041   Lipid Panel     Component Value Date/Time   CHOL 175 12/23/2022 0910   TRIG 104 12/23/2022 0910   HDL 50 12/23/2022 0910   CHOLHDL 3.2 06/19/2020 0120   VLDL 9 06/19/2020 0120   LDLCALC 106 (H) 12/23/2022 0910   Hepatic Function Panel     Component Value Date/Time   PROT 7.1 12/23/2022 0910   ALBUMIN 4.1 12/23/2022 0910   AST 53 (H) 12/23/2022 0910   ALT 75 (H) 12/23/2022 0910   ALKPHOS 107 12/23/2022 0910   BILITOT 0.3 12/23/2022 0910   BILIDIR <0.1 04/07/2018 1048   IBILI NOT CALCULATED 04/07/2018 1048      Component Value Date/Time   TSH 0.828 05/05/2022 0847    Nutritional Lab Results  Component Value Date   VD25OH 39.7 12/23/2022   VD25OH 27.3 (L) 12/31/2021    ASSOCIATED CONDITIONS ADDRESSED TODAY  ASSESSMENT AND PLAN  Problem List Items Addressed This Visit     Type 2 diabetes mellitus with other specified complication - Primary    Type 2 Diabetes Mellitus with circulatory complications, without long-term current use of insulin HgbA1c is at goal. Last A1c was 6.0  Medication(s): Ozempic 0.5 mg SQ weekly No N/V/Constipation/diarrhea. No neck mass or difficulty swallowing. Stable mood.   Lab Results  Component Value Date   HGBA1C 6.0 (H) 12/23/2022   HGBA1C 6.5 (H) 12/31/2021   HGBA1C 5.9 (H) 08/17/2020   Lab Results  Component Value Date   MICROALBUR 0.84 04/03/2010   LDLCALC 106 (H) 12/23/2022   CREATININE 1.24 12/23/2022  No results found for: "GFR"  Plan: Continue and refill Ozempic 0.5 mg SQ weekly Continue working on nutrition plan to decrease simple carbohydrates, increase lean proteins and exercise to promote weight loss, and improve glycemic control.         Relevant Medications   Semaglutide,0.25 or 0.5MG /DOS, (OZEMPIC, 0.25 OR 0.5 MG/DOSE,) 2 MG/3ML SOPN   NAFLD (nonalcoholic fatty liver disease)    Patient has two of more risk factors for MASLD. Liver enzymes are elevated but improving. Most recent imaging includes :CT ABD 05/22/22-Hepatobiliary: There is fatty infiltration liver. Surgical clips are seen in gallbladder fossa. There is no dilation of bile ducts.   Denies use of supplements, heavy alcohol consumption or steatogenic medications.   Plan: Follow-up test results. Losing 15 % of body may lower risk. It is also recommended that he avoid processed foods, simple sugars, heavy alcohol consumption and steatogenic medications. He continues on Ozempic 0.5 mg weekly to promote weight loss.       Vitamin D deficiency    Vitamin D Deficiency Vitamin D is not at goal of 50.  Most recent vitamin D level was  39.7. He is on OTC vitamin D3 2000 IU daily. Lab Results  Component Value Date   VD25OH 39.7 12/23/2022   VD25OH 27.3 (L) 12/31/2021   Plan: Continue OTC vitamin D3 2000 IU daily Low vitamin D levels can be associated with adiposity and may result in leptin resistance and weight gain. Also associated with fatigue. Currently on vitamin D supplementation without any adverse effects.         Obesity- Start BMI 38.47   Relevant Medications   Semaglutide,0.25 or 0.5MG /DOS, (OZEMPIC, 0.25 OR 0.5 MG/DOSE,) 2 MG/3ML SOPN   BMI 38.0-38.9,adult, Current BMI-38.3   Current BMI 38.4 Down 15 lbs TBW loss of  5.9 %   TREATMENT PLAN FOR OBESITY:  Recommended Dietary Goals  Destine is currently in the action stage of change. As such, his goal is to continue weight management plan. He has agreed to the Category 3 Plan.  Behavioral Intervention  We discussed the following Behavioral Modification Strategies today: increasing lean protein intake, decreasing simple carbohydrates , increasing vegetables, increasing fiber rich foods, avoiding skipping meals, and increasing water intake.  Additional resources provided today: NA  Recommended Physical Activity Goals  Kurk has been advised to work up to 150 minutes of moderate intensity aerobic activity a week and strengthening exercises 2-3 times per week for cardiovascular health, weight loss maintenance and preservation of muscle mass.   He has agreed to Continue current level of physical activity    Pharmacotherapy We discussed various medication options to help Zyhir with his weight loss efforts and we both agreed to continue Ozempic 0.5 mg for Type 2 diabetes and working on nutritional and behavioral strategies to promote weight loss.    Return in about 4 weeks (around 02/16/2023).Marland Kitchen He was informed of the importance of frequent follow up visits to maximize his success with intensive lifestyle modifications for his multiple health  conditions.   ATTESTASTION STATEMENTS:  Reviewed by clinician on day of visit: allergies, medications, problem list, medical history, surgical history, family history, social history, and previous encounter notes.   I have personally spent 42 minutes total time today in preparation, patient care, nutritional counseling and documentation for this visit, including the following: review of clinical lab tests; review of medical tests/procedures/services.      Carmaleta Youngers, PA-C

## 2023-01-19 NOTE — Assessment & Plan Note (Signed)
Patient has two of more risk factors for MASLD. Liver enzymes are elevated but improving. Most recent imaging includes :CT ABD 05/22/22-Hepatobiliary: There is fatty infiltration liver. Surgical clips are seen in gallbladder fossa. There is no dilation of bile ducts.   Denies use of supplements, heavy alcohol consumption or steatogenic medications.   Plan: Follow-up test results. Losing 15 % of body may lower risk. It is also recommended that he avoid processed foods, simple sugars, heavy alcohol consumption and steatogenic medications. He continues on Ozempic 0.5 mg weekly to promote weight loss.

## 2023-01-19 NOTE — Assessment & Plan Note (Signed)
Vitamin D Deficiency Vitamin D is not at goal of 50.  Most recent vitamin D level was 39.7. He is on OTC vitamin D3 2000 IU daily. Lab Results  Component Value Date   VD25OH 39.7 12/23/2022   VD25OH 27.3 (L) 12/31/2021    Plan: Continue OTC vitamin D3 2000 IU daily Low vitamin D levels can be associated with adiposity and may result in leptin resistance and weight gain. Also associated with fatigue. Currently on vitamin D supplementation without any adverse effects.

## 2023-01-19 NOTE — Assessment & Plan Note (Signed)
Type 2 Diabetes Mellitus with circulatory complications, without long-term current use of insulin HgbA1c is at goal. Last A1c was 6.0  Medication(s): Ozempic 0.5 mg SQ weekly No N/V/Constipation/diarrhea. No neck mass or difficulty swallowing. Stable mood.   Lab Results  Component Value Date   HGBA1C 6.0 (H) 12/23/2022   HGBA1C 6.5 (H) 12/31/2021   HGBA1C 5.9 (H) 08/17/2020   Lab Results  Component Value Date   MICROALBUR 0.84 04/03/2010   LDLCALC 106 (H) 12/23/2022   CREATININE 1.24 12/23/2022   No results found for: "GFR"  Plan: Continue and refill Ozempic 0.5 mg SQ weekly Continue working on nutrition plan to decrease simple carbohydrates, increase lean proteins and exercise to promote weight loss, and improve glycemic control.

## 2023-02-16 ENCOUNTER — Encounter (INDEPENDENT_AMBULATORY_CARE_PROVIDER_SITE_OTHER): Payer: Self-pay | Admitting: Physician Assistant

## 2023-02-16 ENCOUNTER — Ambulatory Visit (INDEPENDENT_AMBULATORY_CARE_PROVIDER_SITE_OTHER): Payer: 59 | Admitting: Physician Assistant

## 2023-02-16 VITALS — BP 137/89 | HR 77 | Temp 98.0°F | Ht 66.0 in | Wt 239.0 lb

## 2023-02-16 DIAGNOSIS — Z6838 Body mass index (BMI) 38.0-38.9, adult: Secondary | ICD-10-CM

## 2023-02-16 DIAGNOSIS — E669 Obesity, unspecified: Secondary | ICD-10-CM | POA: Diagnosis not present

## 2023-02-16 DIAGNOSIS — E559 Vitamin D deficiency, unspecified: Secondary | ICD-10-CM

## 2023-02-16 DIAGNOSIS — E1169 Type 2 diabetes mellitus with other specified complication: Secondary | ICD-10-CM | POA: Diagnosis not present

## 2023-02-16 DIAGNOSIS — Z7985 Long-term (current) use of injectable non-insulin antidiabetic drugs: Secondary | ICD-10-CM

## 2023-02-16 MED ORDER — OZEMPIC (0.25 OR 0.5 MG/DOSE) 2 MG/3ML ~~LOC~~ SOPN
0.5000 mg | PEN_INJECTOR | SUBCUTANEOUS | 0 refills | Status: DC
Start: 2023-02-16 — End: 2023-03-30

## 2023-02-16 NOTE — Progress Notes (Signed)
Office: [phone]  /  Fax: [phone]  WEIGHT SUMMARY AND BIOMETRICS  Vitals Temp: 98 F (36.7 C) BP: 137/89 Pulse Rate: 77 SpO2: 100 %   Anthropometric Measurements Height: 5\' 6"  (1.676 m) Weight: 239 lb (108.4 kg) BMI (Calculated): 38.59 Weight at Last Visit: 238 lb Weight Lost Since Last Visit: 0 Weight Gained Since Last Visit: 1 lb Starting Weight: 253 lb   Body Composition  Body Fat %: 35.6 % Fat Mass (lbs): 85.2 lbs Muscle Mass (lbs): 146.8 lbs Total Body Water (lbs): 108 lbs Visceral Fat Rating : 23   Other Clinical Data Fasting: yes Labs: no Today's Visit #: 16 Starting Date: 12/11/21     HPI  Chief Complaint: OBESITY  Christopher Wilkins is here to discuss his progress with his obesity treatment plan. He is on the the Category 3 Plan and states he is following his eating plan approximately ? % of the time. He states he is exercising 0 minutes 0 times per week.   Interval History:  Since last office visit he up 1 lb. His grand kids have returned from their visit in [place] and he is glad they've returned .  Hunger/appetite-Not excessive. Appetite low at times. We discussed the importance of regular meals and adequate protein intake for weight loss and overall health.  Cravings-None  Stress- Better with grand kids back from [place] Sleep- using CPAP for OSA. Tinnitus interferes with sleep and we discussed trying a white noise machine to help with tinnitus.  Exercise- Walking more again and encouraged in increase movement/walking over the next 4 wks.    Needs to have follow up with PCP at [facility] and plans to make appointment.   Pharmacotherapy: Ozempic 0.5 mg weekly. Denies mass in neck, dysphagia, dyspepsia, persistent hoarseness, abdominal pain, or N/V/Constipation or diarrhea. Has annual eye exam which is due soon and may be over due. Mood is stable.    PHYSICAL EXAM:  Blood pressure 137/89, pulse 77, temperature 98 F (36.7 C), height 5'  6" (1.676 m), weight 239 lb (108.4 kg), SpO2 100 %. Body mass index is 38.58 kg/m.  General: He is overweight, cooperative, alert, well developed, and in no acute distress. PSYCH: Has normal mood, affect and thought process.   Cardiovascular: HR 77 irreg, no ankle or pedal edema. No SOB.  Lungs: Normal breathing effort, no conversational dyspnea. Neuro: appears grossly intact. Some memory issues as previously noted, but overall grossly intact, appropriate in general conversation and recalls previous visit conversations.   DIAGNOSTIC DATA REVIEWED:  BMET    Component Value Date/Time   NA 144 12/23/2022 0910   K 4.6 12/23/2022 0910   CL 105 12/23/2022 0910   CO2 23 12/23/2022 0910   GLUCOSE 89 12/23/2022 0910   GLUCOSE 109 (H) 06/23/2022 0647   BUN 16 12/23/2022 0910   CREATININE 1.24 12/23/2022 0910   CALCIUM 9.2 12/23/2022 0910   GFRNONAA 57 (L) 06/23/2022 0647   GFRAA 79 07/11/2020 1002   Lab Results  Component Value Date   HGBA1C 6.0 (H) 12/23/2022   HGBA1C 5.8 (H) 06/19/2020   Lab Results  Component Value Date   INSULIN 25.4 (H) 12/23/2022   INSULIN 71.9 (H) 12/31/2021   Lab Results  Component Value Date   TSH 0.828 05/05/2022   CBC    Component Value Date/Time   WBC 5.8 09/15/2022 0846   WBC 5.8 06/23/2022 0647   RBC 6.18 (H) 09/15/2022 0846   RBC 5.59 06/23/2022 0647   HGB  17.3 09/15/2022 0846   HCT 53.5 (H) 09/15/2022 0846   PLT 273 09/15/2022 0846   MCV 87 09/15/2022 0846   MCH 28.0 09/15/2022 0846   MCH 27.7 06/23/2022 0647   MCHC 32.3 09/15/2022 0846   MCHC 32.9 06/23/2022 0647   RDW 15.4 09/15/2022 0846   Iron Studies    Component Value Date/Time   FERRITIN 136.6 05/10/2022 1041   Lipid Panel     Component Value Date/Time   CHOL 175 12/23/2022 0910   TRIG 104 12/23/2022 0910   HDL 50 12/23/2022 0910   CHOLHDL 3.2 06/19/2020 0120   VLDL 9 06/19/2020 0120   LDLCALC 106 (H) 12/23/2022 0910   Hepatic Function Panel     Component Value  Date/Time   PROT 7.1 12/23/2022 0910   ALBUMIN 4.1 12/23/2022 0910   AST 53 (H) 12/23/2022 0910   ALT 75 (H) 12/23/2022 0910   ALKPHOS 107 12/23/2022 0910   BILITOT 0.3 12/23/2022 0910   BILIDIR <0.1 04/07/2018 1048   IBILI NOT CALCULATED 04/07/2018 1048      Component Value Date/Time   TSH 0.828 05/05/2022 0847   Nutritional Lab Results  Component Value Date   VD25OH 39.7 12/23/2022   VD25OH 27.3 (L) 12/31/2021    ASSOCIATED CONDITIONS ADDRESSED TODAY  ASSESSMENT AND PLAN  Problem List Items Addressed This Visit     Type 2 diabetes mellitus with other specified complication (HCC) - Primary   Relevant Medications   Semaglutide,0.25 or 0.5MG /DOS, (OZEMPIC, 0.25 OR 0.5 MG/DOSE,) 2 MG/3ML SOPN   Vitamin D deficiency   Obesity- Start BMI 38.47   Relevant Medications   Semaglutide,0.25 or 0.5MG /DOS, (OZEMPIC, 0.25 OR 0.5 MG/DOSE,) 2 MG/3ML SOPN   BMI 38.0-38.9,adult, Current BMI-38.3   Type 2 Diabetes Mellitus with other specified complication, without long-term current use of insulin HgbA1c is at goal. Last A1c was 6.0 CBGs: Fasting 87-102 Episodes of hypoglycemia: no Medication(s): Ozempic 0.5 mg SQ weekly,Denies mass in neck, dysphagia, dyspepsia, persistent hoarseness, abdominal pain, or N/V/Constipation or diarrhea. Has annual eye exam. Mood is stable.   Farxiga 10 mg daily with hx of chronic HF - well compensated. No side effects . Due follow up with Cardiology soon.   Lab Results  Component Value Date   HGBA1C 6.0 (H) 12/23/2022   HGBA1C 6.5 (H) 12/31/2021   HGBA1C 5.9 (H) 08/17/2020   Lab Results  Component Value Date   MICROALBUR 0.84 04/03/2010   LDLCALC 106 (H) 12/23/2022   CREATININE 1.24 12/23/2022   No results found for: "GFR"  Plan: Continue and refill Ozempic 0.5 mg SQ weekly Hesitant at this point to increase Ozempic as patient does report decreased appetite and not consistently meeting calorie or protein goals.  Continue working on nutrition  plan to decrease simple carbohydrates, increase lean proteins and exercise to promote weight loss,and improve glycemic control. Plan to recheck labs over the next 1-2 months if patient does not have done with PCP.    Vitamin D Deficiency Vitamin D is not at goal of 50.  Most recent vitamin D level was 39.7. He is on OTC vitamin D3 2000 IU daily. Lab Results  Component Value Date   VD25OH 39.7 12/23/2022   VD25OH 27.3 (L) 12/31/2021    Plan: Continue OTC vitamin D3 2000 IU daily Low vitamin D levels can be associated with adiposity and may result in leptin resistance and weight gain. Also associated with fatigue. Currently on vitamin D supplementation without any adverse effects.  Plan to  recheck vitamin D levels over the next 1-2 months.    Obesity:  BMI current 38.7 Down total 14 lbs TBW loss of 5.53% over the past year.  Hesitant to increase GLP-1 medication with poor intake at times, muscle mass decreasing.   TREATMENT PLAN FOR OBESITY:  Recommended Dietary Goals  Christopher Wilkins is currently in the action stage of change. As such, his goal is to continue weight management plan. He has agreed to the Category 3 Plan.  Behavioral Intervention  We discussed the following Behavioral Modification Strategies today: increasing lean protein intake, decreasing simple carbohydrates , increasing vegetables, increasing lower glycemic fruits, increasing fiber rich foods, avoiding skipping meals, increasing water intake, continue to practice mindfulness when eating, and planning for success.  Additional resources provided today: NA  Recommended Physical Activity Goals  Christopher Wilkins has been advised to work up to 150 minutes of moderate intensity aerobic activity a week and strengthening exercises 2-3 times per week for cardiovascular health, weight loss maintenance and preservation of muscle mass.   He has agreed to Continue current level of physical activity  and Think about ways to increase physical  activity   Pharmacotherapy We discussed various medication options to help Christopher Wilkins with his weight loss efforts and we both agreed to continue Ozempic 0.5 mg weekly and continue to work on nutritional and behavioral strategies to promote weight loss.   .    Return in about 4 weeks (around 03/16/2023).Marland Kitchen He was informed of the importance of frequent follow up visits to maximize his success with intensive lifestyle modifications for his multiple health conditions.   ATTESTASTION STATEMENTS:  Reviewed by clinician on day of visit: allergies, medications, problem list, medical history, surgical history, family history, social history, and previous encounter notes.   I have personally spent 40 minutes total time today in preparation, patient care, nutritional counseling and documentation for this visit, including the following: review of clinical lab tests; review of medical tests/procedures/services.      Kasi Lasky, PA-C

## 2023-03-23 ENCOUNTER — Other Ambulatory Visit (INDEPENDENT_AMBULATORY_CARE_PROVIDER_SITE_OTHER): Payer: Self-pay | Admitting: Physician Assistant

## 2023-03-23 DIAGNOSIS — E1169 Type 2 diabetes mellitus with other specified complication: Secondary | ICD-10-CM

## 2023-03-30 ENCOUNTER — Encounter (INDEPENDENT_AMBULATORY_CARE_PROVIDER_SITE_OTHER): Payer: Self-pay

## 2023-03-30 ENCOUNTER — Ambulatory Visit (INDEPENDENT_AMBULATORY_CARE_PROVIDER_SITE_OTHER): Payer: 59 | Admitting: Physician Assistant

## 2023-03-30 ENCOUNTER — Other Ambulatory Visit (INDEPENDENT_AMBULATORY_CARE_PROVIDER_SITE_OTHER): Payer: Self-pay | Admitting: Physician Assistant

## 2023-03-30 DIAGNOSIS — I509 Heart failure, unspecified: Secondary | ICD-10-CM

## 2023-03-30 DIAGNOSIS — E1169 Type 2 diabetes mellitus with other specified complication: Secondary | ICD-10-CM

## 2023-03-30 MED ORDER — OZEMPIC (0.25 OR 0.5 MG/DOSE) 2 MG/3ML ~~LOC~~ SOPN: 0.5000 mg | PEN_INJECTOR | SUBCUTANEOUS | 0 refills | Status: DC

## 2023-03-30 MED ORDER — ALBUTEROL SULFATE HFA 108 (90 BASE) MCG/ACT IN AERS
1.0000 | INHALATION_SPRAY | Freq: Three times a day (TID) | RESPIRATORY_TRACT | 0 refills | Status: DC | PRN
Start: 2023-03-30 — End: 2023-04-28

## 2023-04-11 ENCOUNTER — Ambulatory Visit (INDEPENDENT_AMBULATORY_CARE_PROVIDER_SITE_OTHER): Payer: 59 | Admitting: Podiatry

## 2023-04-11 ENCOUNTER — Encounter: Payer: Self-pay | Admitting: Podiatry

## 2023-04-11 DIAGNOSIS — I509 Heart failure, unspecified: Secondary | ICD-10-CM

## 2023-04-11 DIAGNOSIS — M79675 Pain in left toe(s): Secondary | ICD-10-CM

## 2023-04-11 DIAGNOSIS — B351 Tinea unguium: Secondary | ICD-10-CM | POA: Diagnosis not present

## 2023-04-11 DIAGNOSIS — M79674 Pain in right toe(s): Secondary | ICD-10-CM | POA: Diagnosis not present

## 2023-04-11 DIAGNOSIS — D689 Coagulation defect, unspecified: Secondary | ICD-10-CM | POA: Diagnosis not present

## 2023-04-11 NOTE — Progress Notes (Signed)
  Subjective:  Patient ID: Christopher Wilkins., male    DOB: 1955-12-31,   MRN: [id number]  No chief complaint on file.   67 y.o. male presents for thickened elongated and painful toenails that he cannot trim himself. Has a history of congestive heart failure and is currently on Eliquis. Relates he has a history of cutting his nails and bleeding and wants to have them professionally trimmed . Denies any other pedal complaints. Denies n/v/f/c.   Past Medical History:  Diagnosis Date   Atrial fibrillation (HCC)    CHF (congestive heart failure) (HCC)    Diabetes (HCC)    Dyspnea    Elevated LFTs    Fatty liver    Hypertension    Insomnia    Prediabetes    Sleep apnea    SOB (shortness of breath)    Tubular adenoma of colon    Vitamin D deficiency     Objective:  Physical Exam: Vascular: DP/PT pulses 2/4 bilateral. CFT <3 seconds. Normal hair growth on digits. No edema.  Skin. No lacerations or abrasions bilateral feet. Nails 1-5 are thickened discolored and elongated with subungual debris.  Musculoskeletal: MMT 5/5 bilateral lower extremities in DF, PF, Inversion and Eversion. Deceased ROM in DF of ankle joint.  Neurological: Sensation intact to light touch.   Assessment:   1. Pain due to onychomycosis of toenails of both feet   2. Coagulation disorder (HCC)   3. Congestive heart failure, unspecified HF chronicity, unspecified heart failure type (HCC)       Plan:  Patient was evaluated and treated and all questions answered. -Discussed and educated patient on  foot care, especially with  regards to the vascular, neurological and musculoskeletal systems.  -Discussed supportive shoes at all times and checking feet regularly.  -Mechanically debrided all nails 1-5 bilateral using sterile nail nipper and filed with dremel without incident  -Answered all patient questions -Patient to return  in 3 months for at risk foot care -Patient advised to call the office if any problems or  questions arise in the meantime.   Louann Sjogren, DPM

## 2023-04-26 ENCOUNTER — Other Ambulatory Visit (INDEPENDENT_AMBULATORY_CARE_PROVIDER_SITE_OTHER): Payer: Self-pay | Admitting: Physician Assistant

## 2023-04-26 DIAGNOSIS — E1169 Type 2 diabetes mellitus with other specified complication: Secondary | ICD-10-CM

## 2023-04-28 ENCOUNTER — Encounter (INDEPENDENT_AMBULATORY_CARE_PROVIDER_SITE_OTHER): Payer: Self-pay | Admitting: Physician Assistant

## 2023-04-28 ENCOUNTER — Other Ambulatory Visit (INDEPENDENT_AMBULATORY_CARE_PROVIDER_SITE_OTHER): Payer: Self-pay | Admitting: Physician Assistant

## 2023-04-28 ENCOUNTER — Ambulatory Visit (INDEPENDENT_AMBULATORY_CARE_PROVIDER_SITE_OTHER): Payer: 59 | Admitting: Physician Assistant

## 2023-04-28 VITALS — BP 116/78 | HR 76 | Temp 98.0°F | Ht 66.0 in | Wt 232.0 lb

## 2023-04-28 DIAGNOSIS — E559 Vitamin D deficiency, unspecified: Secondary | ICD-10-CM

## 2023-04-28 DIAGNOSIS — I509 Heart failure, unspecified: Secondary | ICD-10-CM | POA: Diagnosis not present

## 2023-04-28 DIAGNOSIS — E1169 Type 2 diabetes mellitus with other specified complication: Secondary | ICD-10-CM | POA: Diagnosis not present

## 2023-04-28 DIAGNOSIS — K760 Fatty (change of) liver, not elsewhere classified: Secondary | ICD-10-CM

## 2023-04-28 DIAGNOSIS — E860 Dehydration: Secondary | ICD-10-CM

## 2023-04-28 DIAGNOSIS — E669 Obesity, unspecified: Secondary | ICD-10-CM

## 2023-04-28 DIAGNOSIS — Z7985 Long-term (current) use of injectable non-insulin antidiabetic drugs: Secondary | ICD-10-CM

## 2023-04-28 DIAGNOSIS — Z6837 Body mass index (BMI) 37.0-37.9, adult: Secondary | ICD-10-CM

## 2023-04-28 DIAGNOSIS — E7849 Other hyperlipidemia: Secondary | ICD-10-CM

## 2023-04-28 DIAGNOSIS — G4733 Obstructive sleep apnea (adult) (pediatric): Secondary | ICD-10-CM

## 2023-04-28 MED ORDER — OZEMPIC (0.25 OR 0.5 MG/DOSE) 2 MG/3ML ~~LOC~~ SOPN: 0.5000 mg | PEN_INJECTOR | SUBCUTANEOUS | 0 refills | Status: DC

## 2023-04-28 NOTE — Progress Notes (Signed)
.[facility]  Office: [phone]  /  Fax: [phone]  WEIGHT SUMMARY AND BIOMETRICS  Vitals Temp: 98 F (36.7 C) BP: 116/78 Pulse Rate: 76 SpO2: 95 %   Anthropometric Measurements Height: 5\' 6"  (1.676 m) Weight: 232 lb (105.2 kg) BMI (Calculated): 37.46 Weight at Last Visit: 239lb Weight Lost Since Last Visit: 7lb Weight Gained Since Last Visit: 0 Total Weight Loss (lbs): 21 lb (9.526 kg)   Body Composition  Body Fat %: 35 % Fat Mass (lbs): 81.4 lbs Muscle Mass (lbs): 143.8 lbs Total Body Water (lbs): 99.4 lbs Visceral Fat Rating : 23   Other Clinical Data Fasting: no Labs: no Today's Visit #: 17     HPI  Chief Complaint: OBESITY  Christopher Wilkins is here to discuss his progress with his obesity treatment plan. He is on the the Category 3 Plan and states he is following his eating plan approximately 80 % of the time. He states he is exercising 30-40 minutes 7 times per week.  Discussed the use of AI scribe software for clinical note transcription with the patient, who gave verbal consent to proceed.  History of Present Illness          Christopher Wilkins, a 67 year old patient with a history of type 2 diabetes, chronic diastolic heart failure, vitamin D deficiency, and obesity, presents for a follow-up visit regarding his obesity treatment. The patient reports an episode of dizziness and vomiting the previous day after taking his medication and using his inhaler. He had been on his way to the grocery store when he started feeling dizzy and subsequently vomited. He was near [facility] at the time and was taken there, where he was told he was dehydrated. The patient finds this puzzling as he has been drinking water regularly. However, he suspects that his Lasix and Farxiga medications, which cause frequent urination, might have contributed to his dehydration. He did not have any labs done at [facility]. The patient also mentions that he did not eat any food the morning of  the episode. His blood sugar levels, which he checks every other day, typically range from 80 to 100. The patient's diet mainly consists of chicken, eggs, whole grain wheat bread, okra, and brussels sprouts. He also mentions that he has an upcoming appointment with a cardiologist but has lost the details due to phone issues.  Interval History:  Since last office visit he is down 7 lbs. Bio impedence scale reviewed with the patient: Down 3 lbs muscle mass Down 2.8 lbs adipose mass Down 8.6 lbs total body water.   Reports had episode yesterday of dizziness and vomited and was evaluated by [facility], his usual PCP at the time of the episode.  Reports he was dehydrated.  Reports he is feeling back to baseline today, feels well following hydrating well at home.  Vitals are stable today BP 116/78, HR 70's and reports he feels good today/normal today.  We discussed that he is on several medications, Lasix, Marcelline Deist that might make him more likely to become dehydrated especially with increased temperatures outdoors. He has follow up with [facility] 05/11/23.  I ordered labs in follow up today, but the patient reported he did not have time to obtain labs in the clinic today. We discussed continuing to hydrate well for the next few days and seeking medical care should he feel ill again.   Hunger/appetite-moderate control. Reports occasionally sleeps through lunch, but then will eat more around 3-4 pm if  skipped lunch. We discussed the importance of regular meals and adequate protein intake for weight loss and overall health. Cravings- not excessive Stress- manageable Exercise-walking daily. We discussed making sure to hydrate well if out in the heat .     Pharmacotherapy: Ozempic 0.5 mg weekly for Type 2 diabetes management. Denies mass in neck, dysphagia, dyspepsia, persistent hoarseness, abdominal pain, or N/V/Constipation or diarrhea. Has annual eye exam. Mood is stable.    TREATMENT  PLAN FOR OBESITY:  Recommended Dietary Goals  [name] is currently in the action stage of change. As such, his goal is to continue weight management plan. He has agreed to the Category 3 Plan.  Behavioral Intervention  We discussed the following Behavioral Modification Strategies today: increasing lean protein intake, decreasing simple carbohydrates , increasing vegetables, increasing lower glycemic fruits, increasing fiber rich foods, avoiding skipping meals, increasing water intake, keeping healthy foods at home, continue to practice mindfulness when eating, planning for success, and better snacking choices.  Additional resources provided today: NA  Recommended Physical Activity Goals  Christopher Wilkins has been advised to work up to 150 minutes of moderate intensity aerobic activity a week and strengthening exercises 2-3 times per week for cardiovascular health, weight loss maintenance and preservation of muscle mass.   He has agreed to Continue current level of physical activity    Pharmacotherapy We discussed various medication options to help [name] with his weight loss efforts and we both agreed to continue Ozempic 0.5 mg weekly for Type 2 diabetes management.    Return in about 4 weeks (around 05/26/2023).Marland Kitchen He was informed of the importance of frequent follow up visits to maximize his success with intensive lifestyle modifications for his multiple health conditions.  PHYSICAL EXAM:  Blood pressure 116/78, pulse 76, temperature 98 F (36.7 C), height 5\' 6"  (1.676 m), weight 232 lb (105.2 kg), SpO2 95%. Body mass index is 37.45 kg/m.  General: He is overweight, cooperative, alert, well developed, and in no acute distress. PSYCH: Has normal mood, affect and thought process.   Cardiovascular: HR 70's regular, BP 116/76 Lungs: Normal breathing effort, no conversational dyspnea. Neuro: no focal deficits, but decreased memory noted.   DIAGNOSTIC DATA REVIEWED:  BMET    Component Value  Date/Time   NA 144 12/23/2022 0910   K 4.6 12/23/2022 0910   CL 105 12/23/2022 0910   CO2 23 12/23/2022 0910   GLUCOSE 89 12/23/2022 0910   GLUCOSE 109 (H) 06/23/2022 0647   BUN 16 12/23/2022 0910   CREATININE 1.24 12/23/2022 0910   CALCIUM 9.2 12/23/2022 0910   GFRNONAA 57 (L) 06/23/2022 0647   GFRAA 79 07/11/2020 1002   Lab Results  Component Value Date   HGBA1C 6.0 (H) 12/23/2022   HGBA1C 5.8 (H) 06/19/2020   Lab Results  Component Value Date   INSULIN 25.4 (H) 12/23/2022   INSULIN 71.9 (H) 12/31/2021   Lab Results  Component Value Date   TSH 0.828 05/05/2022   CBC    Component Value Date/Time   WBC 5.8 09/15/2022 0846   WBC 5.8 06/23/2022 0647   RBC 6.18 (H) 09/15/2022 0846   RBC 5.59 06/23/2022 0647   HGB 17.3 09/15/2022 0846   HCT 53.5 (H) 09/15/2022 0846   PLT 273 09/15/2022 0846   MCV 87 09/15/2022 0846   MCH 28.0 09/15/2022 0846   MCH 27.7 06/23/2022 0647   MCHC 32.3 09/15/2022 0846   MCHC 32.9 06/23/2022 0647   RDW 15.4 09/15/2022 0846   Iron Studies  Component Value Date/Time   FERRITIN 136.6 05/10/2022 1041   Lipid Panel     Component Value Date/Time   CHOL 175 12/23/2022 0910   TRIG 104 12/23/2022 0910   HDL 50 12/23/2022 0910   CHOLHDL 3.2 06/19/2020 0120   VLDL 9 06/19/2020 0120   LDLCALC 106 (H) 12/23/2022 0910   Hepatic Function Panel     Component Value Date/Time   PROT 7.1 12/23/2022 0910   ALBUMIN 4.1 12/23/2022 0910   AST 53 (H) 12/23/2022 0910   ALT 75 (H) 12/23/2022 0910   ALKPHOS 107 12/23/2022 0910   BILITOT 0.3 12/23/2022 0910   BILIDIR <0.1 04/07/2018 1048   IBILI NOT CALCULATED 04/07/2018 1048      Component Value Date/Time   TSH 0.828 05/05/2022 0847   Nutritional Lab Results  Component Value Date   VD25OH 39.7 12/23/2022   VD25OH 27.3 (L) 12/31/2021    ASSOCIATED CONDITIONS ADDRESSED TODAY  ASSESSMENT AND PLAN  Problem List Items Addressed This Visit     CHF (congestive heart failure) (HCC)   Type  2 diabetes mellitus with other specified complication (HCC) - Primary   Relevant Medications   Semaglutide,0.25 or 0.5MG /DOS, (OZEMPIC, 0.25 OR 0.5 MG/DOSE,) 2 MG/3ML SOPN   Obesity- Start BMI 38.47   Relevant Medications   Semaglutide,0.25 or 0.5MG /DOS, (OZEMPIC, 0.25 OR 0.5 MG/DOSE,) 2 MG/3ML SOPN   BMI 37.0-37.9, adult   Dehydration  Type 2 Diabetes Mellitus with other specified complication, without long-term current use of insulin HgbA1c is at goal. Last A1c was 6.0 CBGs: Fasting 87-low 100's  Episodes of hypoglycemia: no Medication(s): Ozempic 0.5 mg SQ weekly Denies mass in neck, dysphagia, dyspepsia, persistent hoarseness, abdominal pain, or N/V/Constipation or diarrhea. Has annual eye exam. Mood is stable.  He is working on [employer] to decrease simple carbohydrates, increase lean proteins and exercise to promote weight loss and improve glycemic control .   Lab Results  Component Value Date   HGBA1C 6.0 (H) 12/23/2022   HGBA1C 6.5 (H) 12/31/2021   HGBA1C 5.9 (H) 08/17/2020   Lab Results  Component Value Date   MICROALBUR 0.84 04/03/2010   LDLCALC 106 (H) 12/23/2022   CREATININE 1.24 12/23/2022   No results found for: "GFR"  Plan: Continue and refill Ozempic 0.5 mg SQ weekly Continue working on nutrition plan to decrease simple carbohydrates, increase lean proteins and exercise to promote weight loss and improve glycemic control . We discussed the importance of regular meals and adequate protein intake for weight loss and overall health.   Dehydration: Recent episode of dizziness and vomiting, possibly due to dehydration. Patient is on Lasix and Farxiga, both of which can contribute to fluid loss. No labs were drawn at the time of the episode. -Check labs today to assess kidney function and hydration status. Patient declined to have labs done in the office today and states he is feeling well today.  -Encourage patient to maintain adequate hydration, especially in  hot weather.   Chronic Diastolic Heart Failure: Patient is due for follow-up with cardiologist, Dr. Elberta Fortis. Patient lost appointment details. -Provide patient with contact information for Dr. Gershon Crane office. -Advise patient to schedule follow-up appointment. Defer to cardiology for adjustment of Lasix and Farxiga.  Patient declined to have labs done in the office today in follow up .    ATTESTASTION STATEMENTS:  Reviewed by clinician on day of visit: allergies, medications, problem list, medical history, surgical history, family history, social history, and previous encounter notes.   I  have personally spent 42 minutes total time today in preparation, patient care, nutritional counseling and documentation for this visit, including the following: review of clinical lab tests; review of medical tests/procedures/services.      Brilyn Tuller, PA-C

## 2023-05-23 ENCOUNTER — Encounter: Payer: 59 | Attending: Psychology | Admitting: Psychology

## 2023-05-23 DIAGNOSIS — E669 Obesity, unspecified: Secondary | ICD-10-CM | POA: Insufficient documentation

## 2023-05-23 DIAGNOSIS — N1831 Chronic kidney disease, stage 3a: Secondary | ICD-10-CM | POA: Diagnosis present

## 2023-05-23 DIAGNOSIS — R413 Other amnesia: Secondary | ICD-10-CM | POA: Diagnosis present

## 2023-05-23 DIAGNOSIS — Z794 Long term (current) use of insulin: Secondary | ICD-10-CM

## 2023-05-23 DIAGNOSIS — E1169 Type 2 diabetes mellitus with other specified complication: Secondary | ICD-10-CM | POA: Insufficient documentation

## 2023-05-23 DIAGNOSIS — I4891 Unspecified atrial fibrillation: Secondary | ICD-10-CM | POA: Diagnosis present
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
## 2023-05-28 ENCOUNTER — Other Ambulatory Visit (INDEPENDENT_AMBULATORY_CARE_PROVIDER_SITE_OTHER): Payer: Self-pay | Admitting: Physician Assistant

## 2023-05-28 DIAGNOSIS — E1169 Type 2 diabetes mellitus with other specified complication: Secondary | ICD-10-CM

## 2023-05-30 ENCOUNTER — Other Ambulatory Visit (INDEPENDENT_AMBULATORY_CARE_PROVIDER_SITE_OTHER): Payer: Self-pay | Admitting: Physician Assistant

## 2023-05-30 DIAGNOSIS — I509 Heart failure, unspecified: Secondary | ICD-10-CM

## 2023-05-31 ENCOUNTER — Encounter (HOSPITAL_BASED_OUTPATIENT_CLINIC_OR_DEPARTMENT_OTHER): Payer: 59

## 2023-05-31 DIAGNOSIS — Z794 Long term (current) use of insulin: Secondary | ICD-10-CM

## 2023-05-31 DIAGNOSIS — E1169 Type 2 diabetes mellitus with other specified complication: Secondary | ICD-10-CM

## 2023-05-31 DIAGNOSIS — R413 Other amnesia: Secondary | ICD-10-CM

## 2023-05-31 DIAGNOSIS — E669 Obesity, unspecified: Secondary | ICD-10-CM

## 2023-05-31 DIAGNOSIS — I4891 Unspecified atrial fibrillation: Secondary | ICD-10-CM

## 2023-06-01 NOTE — Progress Notes (Signed)
Behavioral Observations:  The patient appeared well-groomed and appropriately dressed. His manners were polite and appropriate to the situation. The patient's attitude towards testing was positive and his effort was good.   Neuropsychology Note  [name] completed 120 minutes of neuropsychological testing with technician, Staci Acosta, BA, under the supervision of Arley Phenix, PsyD., Clinical Neuropsychologist. The patient did not appear overtly distressed by the testing session, per behavioral observation or via self-report to the technician. Rest breaks were offered.   Clinical Decision Making: In considering the patient's current level of functioning, level of presumed impairment, nature of symptoms, emotional and behavioral responses during clinical interview, level of literacy, and observed level of motivation/effort, a battery of tests was selected by Dr. Kieth Brightly during initial consultation on 05/23/2023. This was communicated to the technician. Communication between the neuropsychologist and technician was ongoing throughout the testing session and changes were made as deemed necessary based on patient performance on testing, technician observations and additional pertinent factors such as those listed above.  Tests Administered: Controlled Oral Word Association Test (COWAT; FAS & Animals)  Finger Tapping Test (FTT) Grooved Pegboard Wechsler Adult Intelligence Scale, 4th Edition (WAIS-IV) Wechsler Memory Scale, 4th Edition (WMS-IV); Adult Battery  Results:  COWAT:  FAS total= 19 Z=-1.42 Animals total= 16 Z= -0.34  FTT:  R (NDH) Average= 46.2 Percentile Rank= 49th L (DH) Average= 50  Percentile Rank= 52nd  Grooved Pegboard:  R (NDH) time= 124s  Percentile Rank= 34th  L (DH) time- 90s  Percentile Rank= 42nd   WAIS-IV:  Composite Score Summary  Scale Sum of Scaled Scores Composite Score Percentile Rank 95% Conf. Interval Qualitative Description   Verbal Comprehension 20 VCI 81 10 76-87 Low Average  Perceptual Reasoning 23 PRI 86 18 80-93 Low Average  Working Memory 13 WMI 80 9 74-88 Low Average  Processing Speed 17 PSI 92 30 84-101 Average  Full Scale 73 FSIQ 81 10 77-85 Low Average  General Ability 43 GAI 81 10 77-87 Low Average   Verbal Comprehension Subtests Summary  Subtest Raw Score Scaled Score Percentile Rank Reference Group Scaled Score SEM  Similarities 17 7 16 6  1.04  Vocabulary 23 7 16 7  0.67  Information 7 6 9 6  0.73  The scaled scores in the Reference Group Scaled Score column are based on the performance of examinees aged 20:0-34:11 (i.e., the reference group). See Chapter 6 of the WAIS-IV Technical and Interpretive Manual for more information.  Perceptual Reasoning Subtests Summary  Subtest Raw Score Scaled Score Percentile Rank Reference Group Scaled Score SEM  Block Design 16 5 5 4  1.08  Matrix Reasoning 15 11 63 8 0.90  Visual Puzzles 7 7 16 5  0.85   Working Librarian, academic Raw Score Scaled Score Percentile Rank Reference Group Scaled Score SEM  Digit Span 24 9 37 8 0.79  Arithmetic 7 4 2 5  0.99   Processing Speed Subtests Summary  Subtest Raw Score Scaled Score Percentile Rank Reference Group Scaled Score SEM  Symbol Search 23 9 37 6 1.31  Coding 45 8 25 5  0.99   WMS-IV:  Index Score Summary  Index Sum of Scaled Scores Index Score Percentile Rank 95% Confidence Interval Qualitative Descriptor  Auditory Memory (AMI) 28 82 12 77-89 Low Average  Visual Memory (VMI) 27 81 10 76-87 Low Average  Visual Working Memory (VWMI) 21 103 58 96-110 Average  Immediate Memory (IMI) 25 75 5 70-82 Borderline  Delayed Memory (DMI) 30 82 12 76-90  Low Average    Primary Subtest Scaled Score Summary  Subtest Domain Raw Score Scaled Score Percentile Rank  Logical Memory I AM 13 4 2   Logical Memory II AM 9 5 5   Verbal Paired Associates I AM 23 9 37  Verbal Paired Associates II AM 8 10 50  Designs  I VM 40 4 2  Designs II VM 49 10 50  Visual Reproduction I VM 28 8 25   Visual Reproduction II VM 5 5 5   Spatial Addition VWM 10 10 50   Auditory Memory Process Score Summary  Process Score Raw Score Scaled Score Percentile Rank Cumulative Percentage (Base Rate)  LM II Recognition 19 - - 3-9%  VPA II Recognition 38 - - 51-75%   Visual Memory Process Score Summary  Process Score Raw Score Scaled Score Percentile Rank Cumulative Percentage (Base Rate)  DE I Content 26 6 9  -  DE I Spatial 6 2 0.4 -  DE II Content 31 10 50 -  DE II Spatial 10 9 37 -  DE II Recognition 11 - - 10-16%  VR II Recognition 6 - - >75%     ABILITY-MEMORY ANALYSIS  Ability Score:  VCI: 81 Date of Testing:  WAIS-IV; WMS-IV 2023/05/31  Predicted Difference Method   Index Predicted WMS-IV Index Score Actual WMS-IV Index Score Difference Critical Value  Significant Difference Y/N Base Rate  Auditory Memory 90 82 8 10.15 N   Visual Memory 91 81 10 9.30 Y 20-25%  Visual Working Memory 90 103 -13 11.68 Y 15%  Immediate Memory 89 75 14 11.15 Y 10-15%  Delayed Memory 90 82 8 11.49 N   Statistical significance (critical value) at the .01 level.   Feedback to Patient: Dair Necessary. will return on 03/13/2024 for an interactive feedback session with Dr. Kieth Brightly at which time his test performances, clinical impressions and treatment recommendations will be reviewed in detail. The patient understands he can contact our office should he require our assistance before this time.  120 minutes spent face-to-face with patient administering standardized tests, 30 minutes spent scoring Radiographer, therapeutic). [CPT [id number], [id number]]  Full report to follow.

## 2023-06-28 ENCOUNTER — Ambulatory Visit (INDEPENDENT_AMBULATORY_CARE_PROVIDER_SITE_OTHER): Payer: 59 | Admitting: Physician Assistant

## 2023-06-29 NOTE — Progress Notes (Signed)
.smr  Office: [phone]  /  Fax: [phone]  WEIGHT SUMMARY AND BIOMETRICS  Vitals Temp: 98.3 F (36.8 C) BP: 129/82 Pulse Rate: 82 SpO2: 95 %   Anthropometric Measurements Height: 5\' 8"  (1.727 m) Weight: 230 lb (104.3 kg) BMI (Calculated): 34.98 Weight at Last Visit: 232 lb Weight Lost Since Last Visit: 2 lb Weight Gained Since Last Visit: 0 Starting Weight: 253 lb Total Weight Loss (lbs): 23 lb (10.4 kg) Peak Weight: 275 lb   Body Composition  Body Fat %: 33.1 % Fat Mass (lbs): 76.4 lbs Muscle Mass (lbs): 146.8 lbs Total Body Water (lbs): 105.6 lbs Visceral Fat Rating : 20   Other Clinical Data Fasting: yes Labs: no Today's Visit #: 18 Starting Date: 12/31/21     HPI  Chief Complaint: OBESITY  Kinte is here to discuss his progress with his obesity treatment plan. He is on the the Category 3 Plan and states he is following his eating plan approximately 80 % of the time. He states he is exercising walking 60 minutes 3 times per week.  Discussed the use of AI scribe software for clinical note transcription with the patient, who gave verbal consent to proceed.  History of Present Illness  /  Interval History:  Since last office visit he is down 2 lbs.      The patient, a 67 year old with a history of type 2 diabetes and  heart failure, presents for a follow-up visit regarding his obesity treatment plan. He has been taking vitamin D supplements daily and inquires about the appropriate dosage. He has been off Ozempic for about a month and reports some changes in his eyesight, which he describes as "a little funny." Despite not taking Ozempic, he has continued to lose weight.  He admits to not eating consistently, often only having one meal a day. He has been more active recently, walking for extended periods while out at flea markets with his sister. He also reports some bowel irregularities, which he manages with a daily supplement or a cup of milk. He  expresses concern about his eyesight, noting that it has become blurry and that his glasses are no longer helping. He has not seen an eye doctor in over a year.  Undergoing some neuropsychological testing for memory loss and some episodes of confusion with [facility].  Pharmacotherapy: Ozempic 0.5 mg - Has been  out of Ozempic for the past month. Discussed discontinuing at this time and patient is agreeable to stop Ozempic at this point as skipping meals, concerns for remembering to take medications.   TREATMENT PLAN FOR OBESITY: Obesity Patient has lost weight and maintained muscle mass, likely due to increased physical activity (walking). -Encourage continued physical activity and healthy diet. Stop Ozempic at this point with concerns for skipping meals/ concerns for remembering to take medication consistently and vision concerns. Recommended Dietary Goals  Maxamus is currently in the action stage of change. As such, his goal is to continue weight management plan. He has agreed to the Category 3 Plan.  Behavioral Intervention  We discussed the following Behavioral Modification Strategies today: increasing lean protein intake, decreasing simple carbohydrates , increasing vegetables, increasing lower glycemic fruits, avoiding skipping meals, increasing water intake, keeping healthy foods at home, continue to practice mindfulness when eating, and planning for success.  Additional resources provided today: NA  Recommended Physical Activity Goals  Yousof has been advised to work up to 150 minutes of moderate intensity aerobic activity a week and strengthening exercises 2-3  times per week for cardiovascular health, weight loss maintenance and preservation of muscle mass.   He has agreed to Continue current level of physical activity    Pharmacotherapy We discussed various medication options to help Laden with his weight loss efforts and we both agreed to continue to work on nutritional and  behavioral strategies to promote weight loss and to stop Ozempic at this point. He will continue metformin for management of Type 2 diabetes.    Return in about 6 weeks (around 08/11/2023).Marland Kitchen He was informed of the importance of frequent follow up visits to maximize his success with intensive lifestyle modifications for his multiple health conditions.  PHYSICAL EXAM:  Blood pressure 129/82, pulse 82, temperature 98.3 F (36.8 C), height 5\' 8"  (1.727 m), weight 230 lb (104.3 kg), SpO2 95%. Body mass index is 34.97 kg/m.  General: He is overweight, cooperative, alert, well developed, and in no acute distress. PSYCH: Has normal mood, affect and thought process.   Cardiovascular: HR 80's BP 129/82 Lungs: Normal breathing effort, no conversational dyspnea.  DIAGNOSTIC DATA REVIEWED:  BMET    Component Value Date/Time   NA 144 12/23/2022 0910   K 4.6 12/23/2022 0910   CL 105 12/23/2022 0910   CO2 23 12/23/2022 0910   GLUCOSE 89 12/23/2022 0910   GLUCOSE 109 (H) 06/23/2022 0647   BUN 16 12/23/2022 0910   CREATININE 1.24 12/23/2022 0910   CALCIUM 9.2 12/23/2022 0910   GFRNONAA 57 (L) 06/23/2022 0647   GFRAA 79 07/11/2020 1002   Lab Results  Component Value Date   HGBA1C 6.0 (H) 12/23/2022   HGBA1C 5.8 (H) 06/19/2020   Lab Results  Component Value Date   INSULIN 25.4 (H) 12/23/2022   INSULIN 71.9 (H) 12/31/2021   Lab Results  Component Value Date   TSH 0.828 05/05/2022   CBC    Component Value Date/Time   WBC 5.8 09/15/2022 0846   WBC 5.8 06/23/2022 0647   RBC 6.18 (H) 09/15/2022 0846   RBC 5.59 06/23/2022 0647   HGB 17.3 09/15/2022 0846   HCT 53.5 (H) 09/15/2022 0846   PLT 273 09/15/2022 0846   MCV 87 09/15/2022 0846   MCH 28.0 09/15/2022 0846   MCH 27.7 06/23/2022 0647   MCHC 32.3 09/15/2022 0846   MCHC 32.9 06/23/2022 0647   RDW 15.4 09/15/2022 0846   Iron Studies    Component Value Date/Time   FERRITIN 136.6 05/10/2022 1041   Lipid Panel     Component  Value Date/Time   CHOL 175 12/23/2022 0910   TRIG 104 12/23/2022 0910   HDL 50 12/23/2022 0910   CHOLHDL 3.2 06/19/2020 0120   VLDL 9 06/19/2020 0120   LDLCALC 106 (H) 12/23/2022 0910   Hepatic Function Panel     Component Value Date/Time   PROT 7.1 12/23/2022 0910   ALBUMIN 4.1 12/23/2022 0910   AST 53 (H) 12/23/2022 0910   ALT 75 (H) 12/23/2022 0910   ALKPHOS 107 12/23/2022 0910   BILITOT 0.3 12/23/2022 0910   BILIDIR <0.1 04/07/2018 1048   IBILI NOT CALCULATED 04/07/2018 1048      Component Value Date/Time   TSH 0.828 05/05/2022 0847   Nutritional Lab Results  Component Value Date   VD25OH 39.7 12/23/2022   VD25OH 27.3 (L) 12/31/2021    ASSOCIATED CONDITIONS ADDRESSED TODAY  ASSESSMENT AND PLAN  Problem List Items Addressed This Visit     CHF (congestive heart failure) (HCC)   Type 2 diabetes mellitus with other specified  complication (HCC) - Primary   Vitamin D deficiency   Other hyperlipidemia   Obesity- Start BMI 38.47   BMI 35.0-35.9,adult   Type 2 Diabetes Well controlled with an A1C of 6.0. Patient has been off Ozempic for a month and reports inconsistent eating habits and concerns about hypoglycemia. -Discontinue Ozempic as skipping meals and concerns for remembering to take once weekly.  Continue metformin per PCP -Check blood sugars a few times a week (e.g., Monday, Wednesday, Friday). -Encourage consistent meal intake.   Vitamin D supplementation Patient taking 2000 units daily. Last Vitamin D level was 39. -Continue Vitamin D 2000 units daily.  Visual changes Patient reports blurry vision and glasses are not helping. Last eye exam was over a year ago. -Schedule an appointment with an ophthalmologist for a comprehensive eye exam.  Constipation Patient taking over-the-counter medication to promote bowel movements- Senna tabs with colace daily. -Continue current regimen daily as it seems to be helping.  Follow-up -Schedule appointment in six  weeks (October 31st at 7:45 AM). -If regular doctor does not perform labs, consider obtaining labs at next visit to check A1C and other relevant parameters. ATTESTASTION STATEMENTS:  Reviewed by clinician on day of visit: allergies, medications, problem list, medical history, surgical history, family history, social history, and previous encounter notes.   I have personally spent 35 minutes total time today in preparation, patient care, nutritional counseling and documentation for this visit, including the following: review of clinical lab tests; review of medical tests/procedures/services.      Reis Goga, PA-C

## 2023-06-30 ENCOUNTER — Encounter (INDEPENDENT_AMBULATORY_CARE_PROVIDER_SITE_OTHER): Payer: Self-pay | Admitting: Physician Assistant

## 2023-06-30 ENCOUNTER — Ambulatory Visit (INDEPENDENT_AMBULATORY_CARE_PROVIDER_SITE_OTHER): Payer: 59 | Admitting: Physician Assistant

## 2023-06-30 VITALS — BP 129/82 | HR 82 | Temp 98.3°F | Ht 68.0 in | Wt 230.0 lb

## 2023-06-30 DIAGNOSIS — Z7984 Long term (current) use of oral hypoglycemic drugs: Secondary | ICD-10-CM

## 2023-06-30 DIAGNOSIS — E7849 Other hyperlipidemia: Secondary | ICD-10-CM

## 2023-06-30 DIAGNOSIS — E1169 Type 2 diabetes mellitus with other specified complication: Secondary | ICD-10-CM

## 2023-06-30 DIAGNOSIS — K5900 Constipation, unspecified: Secondary | ICD-10-CM

## 2023-06-30 DIAGNOSIS — E669 Obesity, unspecified: Secondary | ICD-10-CM | POA: Diagnosis not present

## 2023-06-30 DIAGNOSIS — Z6835 Body mass index (BMI) 35.0-35.9, adult: Secondary | ICD-10-CM

## 2023-06-30 DIAGNOSIS — E559 Vitamin D deficiency, unspecified: Secondary | ICD-10-CM | POA: Diagnosis not present

## 2023-06-30 DIAGNOSIS — I509 Heart failure, unspecified: Secondary | ICD-10-CM

## 2023-07-11 ENCOUNTER — Ambulatory Visit (INDEPENDENT_AMBULATORY_CARE_PROVIDER_SITE_OTHER): Payer: 59 | Admitting: Podiatry

## 2023-07-11 ENCOUNTER — Encounter: Payer: Self-pay | Admitting: Podiatry

## 2023-07-11 DIAGNOSIS — E119 Type 2 diabetes mellitus without complications: Secondary | ICD-10-CM | POA: Diagnosis not present

## 2023-07-11 DIAGNOSIS — E1169 Type 2 diabetes mellitus with other specified complication: Secondary | ICD-10-CM | POA: Diagnosis not present

## 2023-07-11 DIAGNOSIS — D689 Coagulation defect, unspecified: Secondary | ICD-10-CM | POA: Diagnosis not present

## 2023-07-11 DIAGNOSIS — M79675 Pain in left toe(s): Secondary | ICD-10-CM | POA: Diagnosis not present

## 2023-07-11 DIAGNOSIS — B351 Tinea unguium: Secondary | ICD-10-CM | POA: Diagnosis not present

## 2023-07-11 DIAGNOSIS — M79674 Pain in right toe(s): Secondary | ICD-10-CM

## 2023-07-11 NOTE — Progress Notes (Signed)
Subjective:  Patient ID: Christopher Wilkins., male    DOB: [birth year],   MRN: [id number]  Chief Complaint  Patient presents with   Nail Problem    RFC.    67 y.o. male presents for thickened elongated and painful toenails that he cannot trim himself. Has a history of congestive heart failure and is currently on Eliquis. Relates burning and tingling in their feet. Patient is diabetic and last A1c was  Lab Results  Component Value Date   HGBA1C 6.0 (H) 12/23/2022   .   PCP:  Loura Back, NP    Denies any other pedal complaints. Denies n/v/f/c.   Past Medical History:  Diagnosis Date   Atrial fibrillation (HCC)    CHF (congestive heart failure) (HCC)    Diabetes (HCC)    Dyspnea    Elevated LFTs    Fatty liver    Hypertension    Insomnia    Prediabetes    Sleep apnea    SOB (shortness of breath)    Tubular adenoma of colon    Vitamin D deficiency     Objective:  Physical Exam: Vascular: DP/PT pulses 2/4 bilateral. CFT <3 seconds. Normal hair growth on digits. No edema.  Skin. No lacerations or abrasions bilateral feet. Nails 1-5 are thickened discolored and elongated with subungual debris.  Musculoskeletal: MMT 5/5 bilateral lower extremities in DF, PF, Inversion and Eversion. Deceased ROM in DF of ankle joint.  Neurological: Sensation intact to light touch. Protective sensation intact.   Assessment:   1. Pain due to onychomycosis of toenails of both feet   2. Coagulation disorder (HCC)   3. Type 2 diabetes mellitus with other specified complication, unspecified whether long term insulin use (HCC)   4. Encounter for diabetic foot exam (HCC)        Plan:  Patient was evaluated and treated and all questions answered. -Discussed and educated patient on diabetic  foot care, especially with  regards to the vascular, neurological and musculoskeletal systems.  -Discussed supportive shoes at all times and checking feet regularly.  -Mechanically debrided all nails 1-5  bilateral using sterile nail nipper and filed with dremel without incident  -Answered all patient questions -Patient to return  in 3 months for at risk foot care -Patient advised to call the office if any problems or questions arise in the meantime.   Louann Sjogren, DPM

## 2023-07-12 ENCOUNTER — Other Ambulatory Visit: Payer: Self-pay | Admitting: Cardiology

## 2023-07-19 ENCOUNTER — Ambulatory Visit: Payer: 59 | Attending: Cardiology | Admitting: Cardiology

## 2023-07-19 VITALS — BP 124/82 | HR 94 | Ht 68.0 in | Wt 237.8 lb

## 2023-07-19 DIAGNOSIS — I5022 Chronic systolic (congestive) heart failure: Secondary | ICD-10-CM

## 2023-07-19 DIAGNOSIS — E7800 Pure hypercholesterolemia, unspecified: Secondary | ICD-10-CM | POA: Diagnosis not present

## 2023-07-19 DIAGNOSIS — I10 Essential (primary) hypertension: Secondary | ICD-10-CM

## 2023-07-19 DIAGNOSIS — I4819 Other persistent atrial fibrillation: Secondary | ICD-10-CM

## 2023-07-19 NOTE — Patient Instructions (Signed)
Medication Instructions:  Continue all current medications *If you need a refill on your cardiac medications before your next appointment, please call your pharmacy*   Lab Work: CMET, Magnesium today  If you have labs (blood work) drawn today and your tests are completely normal, you will receive your results only by: MyChart Message (if you have MyChart) OR A paper copy in the mail If you have any lab test that is abnormal or we need to change your treatment, we will call you to review the results.   Testing/Procedures:  ECHO please schedule at check out    Follow-Up: At [facility], you and your health needs are our priority.  As part of our continuing mission to provide you with exceptional heart care, we have created designated Provider Care Teams.  These Care Teams include your primary Cardiologist (physician) and Advanced Practice Providers (APPs -  Physician Assistants and Nurse Practitioners) who all work together to provide you with the care you need, when you need it.  We recommend signing up for the patient portal called "MyChart".  Sign up information is provided on this After Visit Summary.  MyChart is used to connect with patients for Virtual Visits (Telemedicine).  Patients are able to view lab/test results, encounter notes, upcoming appointments, etc.  Non-urgent messages can be sent to your provider as well.   To learn more about what you can do with MyChart, go to [email or web address].    Your next appointment:   6 month(s)  call in Jan 2025 for April appt  Provider:   Little Ishikawa, MD

## 2023-07-19 NOTE — Progress Notes (Signed)
Cardiology Office Note:    Date:  07/19/2023   ID:  Christopher Birkenhead., DOB 1956/03/23, MRN [id number]  PCP:  Loura Back, NP  Cardiologist:  Little Ishikawa, MD  Electrophysiologist:  Regan Lemming, MD   Referring MD: Loura Back, NP   Chief Complaint  Patient presents with   Congestive Heart Failure    History of Present Illness:    Christopher Newstrom. is a 67 y.o. male with a hx of chronic combined systolic congestive heart failure, atrial fibrillation, tobacco use, hypertension who presents for follow-up.  He previously followed with Dr. Gala Romney in the advanced heart failure clinic.  He was admitted in September 2021 with acute combined heart failure and atrial fibrillation.  Echocardiogram showed LVEF 35 to 40%.  He underwent TEE/DCCV with restoration of sinus rhythm.  He was discharged on Eliquis, Entresto, metoprolol, and Lasix.  He was readmitted in November 2021 with acute heart failure and atrial fibrillation.  Echocardiogram showed EF was down to 20%.  He was found to be in cardiogenic shock with AKI, lactic acidosis, coox 44%.  He was started on milrinone and diuresed with IV Lasix.  RHC/LHC showed normal coronary arteries.  He was able to be weaned off milrinone and eventually underwent successful TEE/DCCV with restoration of sinus rhythm.  Hospital follow-up in December he was found to be back in atrial fibrillation.  Was referred to A. fib clinic and noted to be back in sinus rhythm.  Zio patch was placed, which showed no atrial fibrillation.  Echocardiogram on 12/05/2020 showed EF 50 to 55%, normal RV function, likely mild AS.  Since last clinic visit, he reports he is doing well.  Denies any chest pain, dyspnea, lower extremity edema, or palpitations.  Reports occasional lightheadedness but denies any syncope.  No palpitations.  He reports compliance with CPAP, uses almost every night but may miss a night occasionally.  He walks for 30 to 90 minutes daily.  He is off  Ozempic.  Taking Eliquis, reports occasional nosebleed but otherwise denies any bleeding issues.  Wt Readings from Last 3 Encounters:  07/19/23 237 lb 12.8 oz (107.9 kg)  06/30/23 230 lb (104.3 kg)  04/28/23 232 lb (105.2 kg)     Past Medical History:  Diagnosis Date   Atrial fibrillation (HCC)    CHF (congestive heart failure) (HCC)    Diabetes (HCC)    Dyspnea    Elevated LFTs    Fatty liver    Hypertension    Insomnia    Prediabetes    Sleep apnea    SOB (shortness of breath)    Tubular adenoma of colon    Vitamin D deficiency     Past Surgical History:  Procedure Laterality Date   ATRIAL FIBRILLATION ABLATION N/A 06/23/2022   Procedure: ATRIAL FIBRILLATION ABLATION;  Surgeon: Regan Lemming, MD;  Location: [facility] INVASIVE CV LAB;  Service: Cardiovascular;  Laterality: N/A;   CARDIOVERSION N/A 06/20/2020   Procedure: CARDIOVERSION;  Surgeon: Jake Bathe, MD;  Location: [facility] ENDOSCOPY;  Service: Cardiovascular;  Laterality: N/A;   CARDIOVERSION N/A 08/20/2020   Procedure: CARDIOVERSION;  Surgeon: Dolores Patty, MD;  Location: [facility] ENDOSCOPY;  Service: Cardiovascular;  Laterality: N/A;   CHOLECYSTECTOMY     OTHER SURGICAL HISTORY     "Shot a couple of times"   RIGHT/LEFT HEART CATH AND CORONARY ANGIOGRAPHY N/A 08/18/2020   Procedure: RIGHT/LEFT HEART CATH AND CORONARY ANGIOGRAPHY;  Surgeon: Dolores Patty, MD;  Location: [facility] INVASIVE  CV LAB;  Service: Cardiovascular;  Laterality: N/A;   TEE WITHOUT CARDIOVERSION N/A 06/20/2020   Procedure: TRANSESOPHAGEAL ECHOCARDIOGRAM (TEE);  Surgeon: Jake Bathe, MD;  Location: [facility] ENDOSCOPY;  Service: Cardiovascular;  Laterality: N/A;   TEE WITHOUT CARDIOVERSION N/A 08/20/2020   Procedure: TRANSESOPHAGEAL ECHOCARDIOGRAM (TEE);  Surgeon: Dolores Patty, MD;  Location: [facility] ENDOSCOPY;  Service: Cardiovascular;  Laterality: N/A;    Current Medications: Current Meds  Medication Sig   albuterol (VENTOLIN HFA) 108 (90 Base)  MCG/ACT inhaler INHALE 1-2 PUFFS INTO THE LUNGS EVERY 8 (EIGHT) HOURS AS NEEDED FOR WHEEZING OR SHORTNESS OF BREATH.   apixaban (ELIQUIS) 5 MG TABS tablet TAKE 1 TABLET (5 MG TOTAL) BY MOUTH 2 (TWO) TIMES DAILY.   atorvastatin (LIPITOR) 80 MG tablet Take 80 mg by mouth every evening.   blood glucose meter kit and supplies KIT Dispense based on patient and insurance preference. Use up to two times daily as directed.   Blood Glucose Monitoring Suppl Supplies MISC 1 strip by Does not apply route 2 (two) times daily.   Blood Pressure Monitor DEVI Please provide patient with insurance approved blood pressure monitor I10.0   cetirizine (ZYRTEC) 10 MG tablet Take 10 mg by mouth at bedtime.   Cholecalciferol (VITAMIN D) 50 MCG (2000 UT) tablet Take 2,000 Units by mouth daily.   dapagliflozin propanediol (FARXIGA) 10 MG TABS tablet Take 10 mg by mouth daily.   ENTRESTO 49-51 MG Take 1 tablet by mouth 2 (two) times daily.   furosemide (LASIX) 80 MG tablet Take 80 mg by mouth.   metFORMIN (GLUCOPHAGE-XR) 500 MG 24 hr tablet Take 500 mg by mouth daily.   metoprolol succinate (TOPROL-XL) 25 MG 24 hr tablet Take 25 mg by mouth daily.   Misc. Devices (GNP DIGITAL WEIGHT SCALE) MISC 1 Device by Does not apply route daily. Please provide patient with insurance approved scale ICD110 I50.2   potassium chloride SA (KLOR-CON M20) 20 MEQ tablet Take 1 tablet (20 mEq total) by mouth daily. Please contact the office to schedule a follow up appt 1st attempt   sennosides-docusate sodium (SENOKOT-S) 8.6-50 MG tablet Take 1 tablet by mouth 2 (two) times daily as needed for constipation.   WIXELA INHUB 100-50 MCG/ACT AEPB Inhale 1 puff into the lungs 2 (two) times daily.     Allergies:   Codeine and Lisinopril   Social History   Socioeconomic History   Marital status: Single    Spouse name: Not on file   Number of children: Not on file   Years of education: Not on file   Highest education level: Not on file   Occupational History   Occupation: Retired  Tobacco Use   Smoking status: Some Days    Current packs/day: 0.00    Types: Cigarettes    Last attempt to quit: 05/14/2020    Years since quitting: 3.1   Smokeless tobacco: Never   Tobacco comments:    1-2 cigarettes daily  Vaping Use   Vaping status: Never Used  Substance and Sexual Activity   Alcohol use: Yes    Alcohol/week: 2.0 standard drinks of alcohol    Types: 2 Cans of beer per week    Comment: occasionally; once every 2 weeks   Drug use: No   Sexual activity: Not on file  Other Topics Concern   Not on file  Social History Narrative   Not on file   Social Determinants of Health   Financial Resource Strain: Low Risk  (04/01/2022)  Overall Financial Resource Strain (CARDIA)    Difficulty of Paying Living Expenses: Not hard at all  Food Insecurity: No Food Insecurity (04/01/2022)   Hunger Vital Sign    Worried About Running Out of Food in the Last Year: Never true    Ran Out of Food in the Last Year: Never true  Transportation Needs: No Transportation Needs (04/01/2022)   PRAPARE - Administrator, Civil Service (Medical): No    Lack of Transportation (Non-Medical): No  Physical Activity: Insufficiently Active (04/01/2022)   Exercise Vital Sign    Days of Exercise per Week: 3 days    Minutes of Exercise per Session: 30 min  Stress: No Stress Concern Present (04/01/2022)   [employer] of Occupational Health - Occupational Stress Questionnaire    Feeling of Stress : Not at all  Social Connections: Unknown (02/19/2022)   Received from [facility]   Social Network    Social Network: Not on file     Family History: The patient's family history includes Cancer in his mother; High Cholesterol in his father; High blood pressure in his father; Obesity in his mother. There is no history of Colon cancer or Depression.  ROS:   Please see the history of present illness.     All other systems  reviewed and are negative.  EKGs/Labs/Other Studies Reviewed:    The following studies were reviewed today:   EKG:   09/15/2022: Normal sinus rhythm, rate 74, nonspecific T wave flattening, QTc 481   Recent Labs: 09/15/2022: Hemoglobin 17.3; Magnesium 2.0; Platelets 273 12/23/2022: ALT 75; BUN 16; Creatinine, Ser 1.24; Potassium 4.6; Sodium 144  Recent Lipid Panel    Component Value Date/Time   CHOL 175 12/23/2022 0910   TRIG 104 12/23/2022 0910   HDL 50 12/23/2022 0910   CHOLHDL 3.2 06/19/2020 0120   VLDL 9 06/19/2020 0120   LDLCALC 106 (H) 12/23/2022 0910    Physical Exam:    VS:  BP 124/82   Pulse 94   Ht 5\' 8"  (1.727 m)   Wt 237 lb 12.8 oz (107.9 kg)   SpO2 95%   BMI 36.16 kg/m     Wt Readings from Last 3 Encounters:  07/19/23 237 lb 12.8 oz (107.9 kg)  06/30/23 230 lb (104.3 kg)  04/28/23 232 lb (105.2 kg)     GEN: Well nourished, well developed in no acute distress HEENT: Normal NECK: No JVD appreciated but difficult to assess given habitus; No carotid bruits CARDIAC: RRR, no murmurs, rubs, gallops RESPIRATORY:  Clear to auscultation without rales, wheezing or rhonchi  ABDOMEN: Soft, non-tender, non-distended MUSCULOSKELETAL:  No edema; No deformity  SKIN: Warm and dry NEUROLOGIC:  Alert and oriented x 3 PSYCHIATRIC:  Normal affect   ASSESSMENT:    1. Chronic systolic (congestive) heart failure (HCC)   2. Hypercholesterolemia   3. Persistent atrial fibrillation (HCC)   4. Essential hypertension   5. Morbid obesity (HCC)      PLAN:    Chronic Combined Systolic and Diastolic HF: admission 11/21 for a/c systolic heart failure>>Cardiogenic shock requiring milrinone, in setting of Afib w/ RVR.  EF 35-40% in 9/21 -> reduced down to 20% on repeat study 11/21.  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 Cath with normal cors. Echo 12/05/20  EF 50-55% RV normal.  Status post A-fib ablation 06/23/2022 - Continue lasix 80 mg po daily.  Appears euvolemic.  Will check  CMP/magnesium - Continue Entresto 49/51 BID - Continue Farxiga 10 mg  daily   - Continue Toprol XL 25 mg daily.   - Update echocardiogram   Persistent Atrial Fibrillation s/p DCCV 9/21. Had ERAF>>underwent repeat DCCV 11/21 back to [facility].  Zio 12/21 with no AF -Status post ablation 06/23/2022 with Dr. Elberta Fortis.  Appears to be maintaining sinus rhythm.  Previously was on amiodarone, discontinued after ablation - Continue Toprol XL 25 mg daily.  - Continue eliquis 5 mg bid.  - Started CPAP for OSA  HTN: Continue Entresto, metoprolol as above   OSA:  suspect untreated OSA led to A. fib which led to heart failure.  He reports compliance with CPAP, though does not wear occasionally; encouraged him to always use his CPAP at night  Tobacco use: Quit smoking cigarettes.  Currently smoking 1 cigar/week.  Encouraged cessation  Morbid obesity: Body mass index is 36.16 kg/m. Follows with [facility].    HLD: On atorvastatin 80 mg daily.  LDL 106 on 12/23/2022  Transaminitis: Follows with GI, has fatty liver disease.   Check CMET   RTC in 6 months    Medication Adjustments/Labs and Tests Ordered: Current medicines are reviewed at length with the patient today.  Concerns regarding medicines are outlined above.  Orders Placed This Encounter  Procedures   Comprehensive metabolic panel   Magnesium   ECHOCARDIOGRAM COMPLETE     No orders of the defined types were placed in this encounter.     Patient Instructions  Medication Instructions:  Continue all current medications *If you need a refill on your cardiac medications before your next appointment, please call your pharmacy*   Lab Work: CMET, Magnesium today  If you have labs (blood work) drawn today and your tests are completely normal, you will receive your results only by: MyChart Message (if you have MyChart) OR A paper copy in the mail If you have any lab test that is abnormal or we need to change your  treatment, we will call you to review the results.   Testing/Procedures:  ECHO please schedule at check out    Follow-Up: At [facility], you and your health needs are our priority.  As part of our continuing mission to provide you with exceptional heart care, we have created designated Provider Care Teams.  These Care Teams include your primary Cardiologist (physician) and Advanced Practice Providers (APPs -  Physician Assistants and Nurse Practitioners) who all work together to provide you with the care you need, when you need it.  We recommend signing up for the patient portal called "MyChart".  Sign up information is provided on this After Visit Summary.  MyChart is used to connect with patients for Virtual Visits (Telemedicine).  Patients are able to view lab/test results, encounter notes, upcoming appointments, etc.  Non-urgent messages can be sent to your provider as well.   To learn more about what you can do with MyChart, go to [email or web address].    Your next appointment:   6 month(s)  call in Jan 2025 for April appt  Provider:   Little Ishikawa, MD         Signed, Little Ishikawa, MD  07/19/2023 2:15 PM    [facility]

## 2023-07-20 LAB — COMPREHENSIVE METABOLIC PANEL
ALT: 32 [IU]/L (ref 0–44)
AST: 25 [IU]/L (ref 0–40)
Albumin: 3.9 g/dL (ref 3.9–4.9)
Alkaline Phosphatase: 104 [IU]/L (ref 44–121)
BUN/Creatinine Ratio: 15 (ref 10–24)
BUN: 15 mg/dL (ref 8–27)
Bilirubin Total: 0.2 mg/dL (ref 0.0–1.2)
CO2: 23 mmol/L (ref 20–29)
Calcium: 9 mg/dL (ref 8.6–10.2)
Chloride: 105 mmol/L (ref 96–106)
Creatinine, Ser: 1.02 mg/dL (ref 0.76–1.27)
Globulin, Total: 2.9 g/dL (ref 1.5–4.5)
Glucose: 102 mg/dL — ABNORMAL HIGH (ref 70–99)
Potassium: 4.2 mmol/L (ref 3.5–5.2)
Sodium: 143 mmol/L (ref 134–144)
Total Protein: 6.8 g/dL (ref 6.0–8.5)
eGFR: 80 mL/min/{1.73_m2} (ref >59)

## 2023-07-20 LAB — MAGNESIUM: Magnesium: 2.1 mg/dL (ref 1.6–2.3)

## 2023-08-04 ENCOUNTER — Ambulatory Visit (HOSPITAL_COMMUNITY): Payer: 59 | Attending: Cardiology

## 2023-08-04 DIAGNOSIS — E7800 Pure hypercholesterolemia, unspecified: Secondary | ICD-10-CM | POA: Insufficient documentation

## 2023-08-04 DIAGNOSIS — I4819 Other persistent atrial fibrillation: Secondary | ICD-10-CM | POA: Insufficient documentation

## 2023-08-04 DIAGNOSIS — I5022 Chronic systolic (congestive) heart failure: Secondary | ICD-10-CM

## 2023-08-04 LAB — ECHOCARDIOGRAM COMPLETE
AR max vel: 0.98 cm2
AV Area VTI: 0.98 cm2
AV Area mean vel: 0.88 cm2
AV Mean grad: 13 mm[Hg]
AV Peak grad: 22.3 mm[Hg]
Ao pk vel: 2.36 m/s
Area-P 1/2: 3.13 cm2
MV M vel: 3.82 m/s
MV Peak grad: 58.4 mm[Hg]
S' Lateral: 3.5 cm

## 2023-08-05 ENCOUNTER — Other Ambulatory Visit (HOSPITAL_COMMUNITY): Payer: Self-pay | Admitting: *Deleted

## 2023-08-05 DIAGNOSIS — I350 Nonrheumatic aortic (valve) stenosis: Secondary | ICD-10-CM

## 2023-08-05 DIAGNOSIS — I4819 Other persistent atrial fibrillation: Secondary | ICD-10-CM

## 2023-08-11 ENCOUNTER — Ambulatory Visit (INDEPENDENT_AMBULATORY_CARE_PROVIDER_SITE_OTHER): Payer: 59 | Admitting: Physician Assistant

## 2023-08-11 ENCOUNTER — Encounter (INDEPENDENT_AMBULATORY_CARE_PROVIDER_SITE_OTHER): Payer: Self-pay | Admitting: Physician Assistant

## 2023-08-11 VITALS — BP 126/80 | HR 80 | Temp 97.9°F | Ht 68.0 in | Wt 231.0 lb

## 2023-08-11 DIAGNOSIS — E1169 Type 2 diabetes mellitus with other specified complication: Secondary | ICD-10-CM | POA: Diagnosis not present

## 2023-08-11 DIAGNOSIS — I509 Heart failure, unspecified: Secondary | ICD-10-CM | POA: Diagnosis not present

## 2023-08-11 DIAGNOSIS — Z7984 Long term (current) use of oral hypoglycemic drugs: Secondary | ICD-10-CM

## 2023-08-11 DIAGNOSIS — Z6835 Body mass index (BMI) 35.0-35.9, adult: Secondary | ICD-10-CM

## 2023-08-11 DIAGNOSIS — E7849 Other hyperlipidemia: Secondary | ICD-10-CM | POA: Diagnosis not present

## 2023-08-11 DIAGNOSIS — E669 Obesity, unspecified: Secondary | ICD-10-CM

## 2023-08-11 NOTE — Progress Notes (Signed)
.smr  Office: [phone]  /  Fax: [phone]  WEIGHT SUMMARY AND BIOMETRICS  Vitals Temp: 97.9 F (36.6 C) BP: 126/80 Pulse Rate: 80 SpO2: 97 %   Anthropometric Measurements Height: 5\' 8"  (1.727 m) Weight: 231 lb (104.8 kg) BMI (Calculated): 35.13 Weight at Last Visit: 230 lb Weight Lost Since Last Visit: 0 Weight Gained Since Last Visit: 1 lb Starting Weight: 253 lb Total Weight Loss (lbs): 22 lb (9.979 kg) Peak Weight: 275 lb   Body Composition  Body Fat %: 33.4 % Fat Mass (lbs): 77.2 lbs Muscle Mass (lbs): 146.6 lbs Total Body Water (lbs): 104.6 lbs Visceral Fat Rating : 21   Other Clinical Data Fasting: no Labs: no Today's Visit #: 19 Starting Date: 12/31/21     HPI  Chief Complaint: OBESITY  Christopher Wilkins is here to discuss his progress with his obesity treatment plan. He is on the the Category 3 Plan and states he is following his eating plan approximately 50 % of the time. He states he is exercising walking with his sister 60-120 minutes 5 times per week. Discussed the use of AI scribe software for clinical note transcription with the patient, who gave verbal consent to proceed.  History of Present Illness  /   Interval History:  Since last office visit he is up 1 lb. Bio impedence scale reviewed with the patient:   Muscle mass - 0.2 lbs Adipose mass + 0.8 lbs  The patient, with a history of type two diabetes, chronic heart failure, and dyslipidemia, presents for a follow-up of his obesity treatment plan.  He has been off Ozempic for a while and has not gained much weight back.  He denies overeating or having an increased appetite.  He checks his blood sugars every other day, which are usually between 86 and 107.  He denies craving sweets and only indulges in an oatmeal cake about once a month.   He has been walking regularly with his sister, which has helped him maintain his weight and build muscle. He has also received his flu shots and other  vaccinations.  He denies any periods of dizziness. Light headedness, palpitations, chest pain or other concerns today.   Trying to focus on getting in adequate protein and avoiding simple carbohydrates.  He is undergoing some testing for memory concerns with [facility].   Pharmacotherapy: Ozempic discontinued due to skipping meals and also not consistently on medication.   TREATMENT PLAN FOR OBESITY: Obesity Stable weight following discontinuation of very low dose Ozempic.  No overeating or cravings reported. Regular physical activity with daily walks. -Continue current diet and exercise regimen. -Schedule follow-up in six weeks to monitor weight. Recommended Dietary Goals  Christopher Wilkins is currently in the action stage of change. As such, his goal is to continue weight management plan. He has agreed to the Category 3 Plan.  Behavioral Intervention  We discussed the following Behavioral Modification Strategies today: continue to work on maintaining a reduced calorie state, getting the recommended amount of protein, incorporating whole foods, making healthy choices, staying well hydrated and practicing mindfulness when eating..  Additional resources provided today: NA  Recommended Physical Activity Goals  Christopher Wilkins has been advised to work up to 150 minutes of moderate intensity aerobic activity a week and strengthening exercises 2-3 times per week for cardiovascular health, weight loss maintenance and preservation of muscle mass.   He has agreed to Think about enjoyable ways to increase daily physical activity and overcoming barriers to exercise and Increase physical  activity in their day and reduce sedentary time (increase NEAT).   Pharmacotherapy We discussed various medication options to help Christopher Wilkins with his weight loss efforts and we both agreed to continue to work on nutritional and behavioral strategies to promote weight loss.  .  Return in about 6 weeks (around 09/22/2023).Marland Kitchen He was  informed of the importance of frequent follow up visits to maximize his success with intensive lifestyle modifications for his multiple health conditions.  PHYSICAL EXAM:  Blood pressure 126/80, pulse 80, temperature 97.9 F (36.6 C), height 5\' 8"  (1.727 m), weight 231 lb (104.8 kg), SpO2 97%. Body mass index is 35.12 kg/m.  General: He is overweight, cooperative, alert, well developed, and in no acute distress. PSYCH: Has normal mood, affect and thought process.  Cardiovascular: HR 80's BP 126/80 , No ankle edema.  Lungs: Normal breathing effort, no conversational dyspnea.  DIAGNOSTIC DATA REVIEWED:  BMET    Component Value Date/Time   NA 143 07/19/2023 1424   K 4.2 07/19/2023 1424   CL 105 07/19/2023 1424   CO2 23 07/19/2023 1424   GLUCOSE 102 (H) 07/19/2023 1424   GLUCOSE 109 (H) 06/23/2022 0647   BUN 15 07/19/2023 1424   CREATININE 1.02 07/19/2023 1424   CALCIUM 9.0 07/19/2023 1424   GFRNONAA 57 (L) 06/23/2022 0647   GFRAA 79 07/11/2020 1002   Lab Results  Component Value Date   HGBA1C 6.0 (H) 12/23/2022   HGBA1C 5.8 (H) 06/19/2020   Lab Results  Component Value Date   INSULIN 25.4 (H) 12/23/2022   INSULIN 71.9 (H) 12/31/2021   Lab Results  Component Value Date   TSH 0.828 05/05/2022   CBC    Component Value Date/Time   WBC 5.8 09/15/2022 0846   WBC 5.8 06/23/2022 0647   RBC 6.18 (H) 09/15/2022 0846   RBC 5.59 06/23/2022 0647   HGB 17.3 09/15/2022 0846   HCT 53.5 (H) 09/15/2022 0846   PLT 273 09/15/2022 0846   MCV 87 09/15/2022 0846   MCH 28.0 09/15/2022 0846   MCH 27.7 06/23/2022 0647   MCHC 32.3 09/15/2022 0846   MCHC 32.9 06/23/2022 0647   RDW 15.4 09/15/2022 0846   Iron Studies    Component Value Date/Time   FERRITIN 136.6 05/10/2022 1041   Lipid Panel     Component Value Date/Time   CHOL 175 12/23/2022 0910   TRIG 104 12/23/2022 0910   HDL 50 12/23/2022 0910   CHOLHDL 3.2 06/19/2020 0120   VLDL 9 06/19/2020 0120   LDLCALC 106 (H)  12/23/2022 0910   Hepatic Function Panel     Component Value Date/Time   PROT 6.8 07/19/2023 1424   ALBUMIN 3.9 07/19/2023 1424   AST 25 07/19/2023 1424   ALT 32 07/19/2023 1424   ALKPHOS 104 07/19/2023 1424   BILITOT 0.2 07/19/2023 1424   BILIDIR <0.1 04/07/2018 1048   IBILI NOT CALCULATED 04/07/2018 1048      Component Value Date/Time   TSH 0.828 05/05/2022 0847   Nutritional Lab Results  Component Value Date   VD25OH 39.7 12/23/2022   VD25OH 27.3 (L) 12/31/2021    ASSOCIATED CONDITIONS ADDRESSED TODAY  ASSESSMENT AND PLAN  Problem List Items Addressed This Visit     CHF (congestive heart failure) (HCC)   Type 2 diabetes mellitus with other specified complication (HCC) - Primary   Other hyperlipidemia   Obesity- Start BMI 38.47   BMI 35.0-35.9,adult    Type 2 Diabetes Lab Results  Component Value Date  HGBA1C 6.0 (H) 12/23/2022    Blood glucose levels well-controlled, with readings between 86 and 107. Regular monitoring every other day. Continues on metformin 500 mg daily and Farxiga 10 mg daily per PCP.  -Continue current management and regular glucose monitoring. Continue working  on nutrition plan to decrease simple carbohydrates, increase lean proteins and exercise to promote weight loss and improve glycemic control .   Hyperlipidemia LDL is not at goal. Medication(s): Lipitor 80 mg daily. Reports no side effects.  Cardiovascular risk factors: advanced age (older than 59 for men, 27 for women), diabetes mellitus, dyslipidemia, hypertension, male gender, obesity (BMI >= 30 kg/m2), sedentary lifestyle, and smoking/ tobacco exposure  Lab Results  Component Value Date   CHOL 175 12/23/2022   HDL 50 12/23/2022   LDLCALC 106 (H) 12/23/2022   TRIG 104 12/23/2022   CHOLHDL 3.2 06/19/2020   CHOLHDL 4.3 Ratio 04/03/2010   CHOLHDL 3.6 Ratio 08/08/2009   Lab Results  Component Value Date   ALT 32 07/19/2023   AST 25 07/19/2023   ALKPHOS 104 07/19/2023    BILITOT 0.2 07/19/2023   The 10-year ASCVD risk score (Arnett DK, et al., 2019) is: 45.3%   Values used to calculate the score:     Age: 67 years     Sex: Male     Is Non-Hispanic African American: Yes     Diabetic: Yes     Tobacco smoker: Yes     Systolic Blood Pressure: 126 mmHg     Is BP treated: Yes     HDL Cholesterol: 50 mg/dL     Total Cholesterol: 175 mg/dL  Plan: Continue Lipitor 80 mg daily.  Continue to work on nutrition plan -decreasing simple carbohydrates, increasing lean proteins, decreasing saturated fats and cholesterol , avoiding trans fats and exercise as able to promote weight loss, improve lipids and decrease cardiovascular risks. Ozempic discontinued due to consistently skipping meals/medication use.     Chronic Heart Failure Follow up with Dr. Epifanio Lesches 07/19/2023: Recommendations:  - Continue lasix 80 mg po daily.  Appears euvolemic.  Will check CMP/magnesium - Continue Entresto 49/51 BID - Continue Farxiga 10 mg daily   - Continue Toprol XL 25 mg daily.   - Update echocardiogram- completed:  Recent echocardiogram 08/04/23:  1. Left ventricular ejection fraction, by estimation, is 55 to 60%. Left  ventricular ejection fraction by 3D volume is 60 %. The left ventricle has  normal function. The left ventricle has no regional wall motion  abnormalities. Left ventricular diastolic   parameters are indeterminate. Elevated left ventricular end-diastolic  pressure. The average left ventricular global longitudinal strain is -16.0  %. The global longitudinal strain is normal.   2. Right ventricular systolic function is normal. The right ventricular  size is normal. Tricuspid regurgitation signal is inadequate for assessing  PA pressure.   3. The mitral valve is degenerative. Mild mitral valve regurgitation. No  evidence of mitral stenosis. Severe mitral annular calcification.   4. The aortic valve is tricuspid. There is severe calcifcation of the   aortic valve. There is severe thickening of the aortic valve. Aortic valve  regurgitation is not visualized. Moderate aortic valve stenosis. Aortic  valve area, by VTI measures 0.98  cm. Aortic valve mean gradient measures 13.0 mmHg. Aortic valve Vmax  measures 2.36 m/s. Although the mean AVG is consistent with mild AS, the  DVI is reduced at 0.31 and SVI low at 23. 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  -Continue current heart medications. At today's visit, no concerning signs and appears well compensated and without complaint. Follow up with Cardiology as directed.   ATTESTASTION STATEMENTS:  Reviewed by clinician on day of visit: allergies, medications, problem list, medical history, surgical history, family history, social history, and previous encounter notes.   I have personally spent 33 minutes total time today in preparation, patient care, nutritional counseling and documentation for this visit, including the following: review of clinical lab tests; review of medical tests/procedures/services.      Christopher Glinski, PA-C

## 2023-08-19 ENCOUNTER — Encounter: Payer: Self-pay | Admitting: Student

## 2023-08-19 ENCOUNTER — Ambulatory Visit: Payer: 59 | Attending: Student | Admitting: Student

## 2023-08-19 VITALS — BP 122/82 | HR 84 | Ht 68.0 in | Wt 238.0 lb

## 2023-08-19 DIAGNOSIS — I5042 Chronic combined systolic (congestive) and diastolic (congestive) heart failure: Secondary | ICD-10-CM | POA: Diagnosis not present

## 2023-08-19 DIAGNOSIS — I10 Essential (primary) hypertension: Secondary | ICD-10-CM | POA: Diagnosis not present

## 2023-08-19 DIAGNOSIS — I4819 Other persistent atrial fibrillation: Secondary | ICD-10-CM

## 2023-08-19 NOTE — Patient Instructions (Signed)
Medication Instructions:  Your physician recommends that you continue on your current medications as directed. Please refer to the Current Medication list given to you today.  *If you need a refill on your cardiac medications before your next appointment, please call your pharmacy*  Lab Work: None ordered If you have labs (blood work) drawn today and your tests are completely normal, you will receive your results only by: MyChart Message (if you have MyChart) OR A paper copy in the mail If you have any lab test that is abnormal or we need to change your treatment, we will call you to review the results.  Follow-Up: At [facility], you and your health needs are our priority.  As part of our continuing mission to provide you with exceptional heart care, we have created designated Provider Care Teams.  These Care Teams include your primary Cardiologist (physician) and Advanced Practice Providers (APPs -  Physician Assistants and Nurse Practitioners) who all work together to provide you with the care you need, when you need it.  Your next appointment:   1 year(s)  Provider:   Casimiro Needle "Otilio Saber, PA-C

## 2023-08-19 NOTE — Progress Notes (Signed)
  Electrophysiology Office Note:   Date:  08/19/2023  ID:  Christopher Wilkins., DOB 06-23-1956, MRN [id number]  Primary Cardiologist: Little Ishikawa, MD Electrophysiologist: Regan Lemming, MD      History of Present Illness:   Christopher Wilkins. is a 67 y.o. male with h/o chronic combined CHF, AF, tobacco abuse, and HTN seen today for routine electrophysiology followup.   Since last being seen in our clinic the patient reports doing very well.  he denies chest pain, palpitations, dyspnea, PND, orthopnea, nausea, vomiting, dizziness, syncope, edema, weight gain, or early satiety.   Review of systems complete and found to be negative unless listed in HPI.   EP Information / Studies Reviewed:    EKG is ordered today. Personal review as below.  EKG Interpretation Date/Time:  Friday August 19 2023 10:54:16 EST Ventricular Rate:  84 PR Interval:  182 QRS Duration:  92 QT Interval:  402 QTC Calculation: 475 R Axis:   34  Text Interpretation: Sinus rhythm with occasional Premature ventricular complexes Confirmed by Maxine Glenn [phone]) on 08/19/2023 11:04:38 AM    Arrhythmia History  S/p PVI 06/23/2022  Echo 11/2021 LVEF 50-55%, grade 1 DD   Physical Exam:   VS:  BP 122/82   Pulse 84   Ht 5\' 8"  (1.727 m)   Wt 238 lb (108 kg)   SpO2 95%   BMI 36.19 kg/m    Wt Readings from Last 3 Encounters:  08/19/23 238 lb (108 kg)  08/11/23 231 lb (104.8 kg)  07/19/23 237 lb 12.8 oz (107.9 kg)     GEN: Well nourished, well developed in no acute distress NECK: No JVD; No carotid bruits CARDIAC: Regular rate and rhythm, no murmurs, rubs, gallops RESPIRATORY:  Clear to auscultation without rales, wheezing or rhonchi  ABDOMEN: Soft, non-tender, non-distended EXTREMITIES:  No edema; No deformity   ASSESSMENT AND PLAN:    Paroxysmal Atrial Fibrillation  S/p Ablation 06/2022 EKG today shows NSR Continue Eliquis 5mg  BID for CHA2DS2VASC of at least 4    HFrecEF No  changes Volume status stable on exam  OSA  Encouraged nightly CPAP   Obesity Body mass index is 36.19 kg/m.  Encouraged lifestyle modification  Secondary hypercoagulable state Pt on Eliquis as above      Follow up with EP APP in 12 months  Signed, Graciella Freer, PA-C

## 2023-09-22 ENCOUNTER — Encounter (INDEPENDENT_AMBULATORY_CARE_PROVIDER_SITE_OTHER): Payer: Self-pay | Admitting: Physician Assistant

## 2023-09-22 ENCOUNTER — Ambulatory Visit (INDEPENDENT_AMBULATORY_CARE_PROVIDER_SITE_OTHER): Payer: 59 | Admitting: Physician Assistant

## 2023-09-22 VITALS — BP 123/80 | HR 83 | Temp 98.1°F | Ht 68.0 in | Wt 235.0 lb

## 2023-09-22 DIAGNOSIS — E669 Obesity, unspecified: Secondary | ICD-10-CM

## 2023-09-22 DIAGNOSIS — I509 Heart failure, unspecified: Secondary | ICD-10-CM | POA: Diagnosis not present

## 2023-09-22 DIAGNOSIS — E559 Vitamin D deficiency, unspecified: Secondary | ICD-10-CM

## 2023-09-22 DIAGNOSIS — E1169 Type 2 diabetes mellitus with other specified complication: Secondary | ICD-10-CM

## 2023-09-22 DIAGNOSIS — Z7985 Long-term (current) use of injectable non-insulin antidiabetic drugs: Secondary | ICD-10-CM

## 2023-09-22 DIAGNOSIS — E7849 Other hyperlipidemia: Secondary | ICD-10-CM | POA: Diagnosis not present

## 2023-09-22 DIAGNOSIS — Z6835 Body mass index (BMI) 35.0-35.9, adult: Secondary | ICD-10-CM

## 2023-09-22 NOTE — Progress Notes (Signed)
SUBJECTIVE:  Chief Complaint: Obesity Discussed the use of AI scribe software for clinical note transcription with the patient, who gave verbal consent to proceed.  History of Present Illness     Interim History: He is up 4 lbs since last visit, but has been much more active, walking in the mall daily with his sister.  Bio impedence scale reviewed with the patient:  Muscle mass + 2.4 lbs Adipose mass - 1.4 lbs.   Christopher Wilkins, a 67 year old with a history of type 2 diabetes, hyperlipidemia, and congestive heart failure, presents for a follow-up visit regarding his obesity treatment plan. Previously, the patient was on a small dose of Ozempic, which was discontinued due to consistent under-eating.  The patient reports a decreased appetite and admits to not eating much. He typically consumes a sandwich around noon and plans to incorporate soups into his diet, specifically homemade vegetable and chicken and bean soups. He also expresses an interest in incorporating pomegranates into his diet.  The patient has recently restarted Ozempic per his last visit with his PCP as he had gained some weight, but has not yet begun taking it as yet. We discussed concentrating on getting adequate protein and overall calories and we will monitor closely as he resumes Ozempic . His A1c with his PCP was 5.8% . He reports no issues with bowel movements and denies any constipation or diarrhea.  The patient's blood sugars have been consistently between 89 and 92. He has been more active recently, engaging in daily mall walking for about an hour and a half. Despite a slight weight gain, it is noted that the patient has gained muscle mass, which is attributed to his increased activity level.  The patient has a history of consuming a lot of fruit, but has recently stopped eating grapes, bananas, and pineapples due to his sugar content. He expresses interest in consuming more melons and berries, specifically  blueberries and apples. He also inquires about the consumption of canned fruit, specifically those in natural juices.  Christopher Wilkins is here to discuss his progress with his obesity treatment plan. He is on the Category 3 Plan and states he is following his eating plan approximately 70 % of the time. He states he is exercising walking 60-90 minutes 7 times per week.   OBJECTIVE: Visit Diagnoses: Problem List Items Addressed This Visit     CHF (congestive heart failure) (HCC)   Type 2 diabetes mellitus with other specified complication (HCC) - Primary   Relevant Medications   OZEMPIC, 0.25 OR 0.5 MG/DOSE, 2 MG/3ML SOPN   Vitamin D deficiency   Other hyperlipidemia   Obesity- Start BMI 38.47   Relevant Medications   OZEMPIC, 0.25 OR 0.5 MG/DOSE, 2 MG/3ML SOPN  Obesity 67 year old with obesity, previously on low-dose Ozempic, discontinued due to under-eating. Recently obtained Ozempic but has not started. Increased physical activity with muscle gain. Emphasized monitoring for significant appetite suppression. - Restart Ozempic at 0.25 mg once weekly - Monitor dietary protein intake - Encourage continued physical activity - Follow-up in one month to assess progress  Type 2 Diabetes Mellitus Type 2 diabetes mellitus with recent A1c of 5.8%, indicating good glycemic control. Fasting Blood sugars range between 89-92 mg/dL. - Continue monitoring blood glucose levels To resume Ozempic per Christopher Wilkins Primary care provider.  Has history of skipping meals and decreased overall appetite and we discussed the importance of eating enough protein and eating consistently while resuming Ozempic. Will need to monitor closely. We  discussed the importance of regular meals and adequate protein intake for weight loss and overall health. 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  Hyperlipidemia The 10-year ASCVD risk score (Arnett DK, et al., 2019) is: 43.8%   Values used to calculate the score:     Age: 80 years     Sex: Male     Is Non-Hispanic African American: Yes     Diabetic: Yes     Tobacco smoker: Yes     Systolic Blood Pressure: 123 mmHg     Is BP treated: Yes     HDL Cholesterol: 50 mg/dL     Total Cholesterol: 175 mg/dL On lipitor 80 mg daily and Farxiga . Follows with PCP.  - Continue current lipid-lowering therapy. Maybe candidate for other lipid lowering medications due to very high risk for CV complications/events.  Continue to work on nutrition plan -decreasing simple carbohydrates, increasing lean proteins, decreasing saturated fats and cholesterol , avoiding trans fats and exercise as able to promote weight loss, improve lipids and decrease cardiovascular risks.    Congestive Heart Failure Last 2D Echo: 08/04/2023- IMPRESSIONS :   1. Left ventricular ejection fraction, by estimation, is 55 to 60%. Left  ventricular ejection fraction by 3D volume is 60 %. The left ventricle has  normal function. The left ventricle has no regional wall motion  abnormalities. Left ventricular diastolic   parameters are indeterminate. Elevated left ventricular end-diastolic  pressure. The average left ventricular global longitudinal strain is -16.0  %. The global longitudinal strain is normal.   2. Right ventricular systolic function is normal. The right ventricular  size is normal. Tricuspid regurgitation signal is inadequate for assessing  PA pressure.   3. The mitral valve is degenerative. Mild mitral valve regurgitation. No  evidence of mitral stenosis. Severe mitral annular calcification.   4. The aortic valve is tricuspid. There is severe calcifcation of the  aortic valve. There is severe thickening of the aortic valve. Aortic valve  regurgitation is not visualized. Moderate aortic valve stenosis. Aortic  valve area, by VTI measures 0.98  cm.  Aortic valve mean gradient measures 13.0 mmHg. Aortic valve Vmax  measures 2.36 m/s. Although the mean AVG is consistent with mild AS, the  DVI is reduced at 0.31 and SVI low at 23. 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    Congestive heart failure, appears well compensated currently. On Farxiga and Entresto as well as low dose BB. On Eliquis as well.  - Continue current heart failure management-? Plans for referral to CV surgery for valvular disease. Appears to have good exercise tolerance currently and exercising regularly with sister/mall walking daily.  - Monitor for symptoms of exacerbation May be candidate for other additional medication to maximize function/slow progression.    General Health Maintenance Advised on balanced diet with increased protein intake, low-sugar fruits, and high-protein soups. Discussed benefits of lentils and proper preparation of pomegranate seeds. - Provide dietary guidance on low-sugar fruits - Encourage consumption of high-protein soups and lentils - Advise on proper preparation of pomegranate seeds  Follow-up - Follow-up appointment on January 20th at 8:00 AM - Contact healthcare provider if experiencing significant appetite suppression or other concerns with Ozempic.  Vitals Temp: 98.1 F (36.7 C) BP: 123/80 Pulse Rate: 83 SpO2: 96 %   Anthropometric Measurements Height: 5\' 8"  (1.727 m) Weight: 235 lb (  106.6 kg) BMI (Calculated): 35.74 Weight at Last Visit: 231 lb Weight Lost Since Last Visit: 0 Weight Gained Since Last Visit: 4 lb Starting Weight: 253 lb Total Weight Loss (lbs): 18 lb (8.165 kg) Peak Weight: 275 lb   Body Composition  Body Fat %: 33.4 % Fat Mass (lbs): 78.6 lbs Muscle Mass (lbs): 149 lbs Total Body Water (lbs): 104 lbs Visceral Fat Rating : 21   Other Clinical Data Fasting: yes Labs: no Today's Visit #: 20 Starting Date: 12/31/21     ASSESSMENT AND PLAN:  Diet: Christopher Wilkins is  currently in the action stage of change. As such, his goal is to continue with weight loss efforts. He has agreed to Category 3 Plan.  Exercise: Christopher Wilkins has been instructed to work up to a goal of 150 minutes of combined cardio and strengthening exercise per week for weight loss and overall health benefits.   Behavior Modification:  We discussed the following Behavioral Modification Strategies today: increasing lean protein intake, decreasing simple carbohydrates, increasing vegetables, increase H2O intake, increase high fiber foods, no skipping meals, keeping healthy foods in the home, holiday eating strategies, and planning for success. We discussed various medication options to help Christopher Wilkins with his weight loss efforts and we both agreed to monitor closely as he resumes Ozempic for Type 2 diabetes management.  No follow-ups on file.Marland Kitchen He was informed of the importance of frequent follow up visits to maximize his success with intensive lifestyle modifications for his multiple health conditions.  Attestation Statements:   Reviewed by clinician on day of visit: allergies, medications, problem list, medical history, surgical history, family history, social history, and previous encounter notes.   Time spent on visit including pre-visit chart review and post-visit care and charting was 32 minutes.    Christopher Polo, PA-C

## 2023-10-10 ENCOUNTER — Encounter: Payer: Self-pay | Admitting: Podiatry

## 2023-10-10 ENCOUNTER — Ambulatory Visit (INDEPENDENT_AMBULATORY_CARE_PROVIDER_SITE_OTHER): Payer: 59 | Admitting: Podiatry

## 2023-10-10 DIAGNOSIS — M79675 Pain in left toe(s): Secondary | ICD-10-CM | POA: Diagnosis not present

## 2023-10-10 DIAGNOSIS — M79674 Pain in right toe(s): Secondary | ICD-10-CM

## 2023-10-10 DIAGNOSIS — E1169 Type 2 diabetes mellitus with other specified complication: Secondary | ICD-10-CM

## 2023-10-10 DIAGNOSIS — B351 Tinea unguium: Secondary | ICD-10-CM | POA: Diagnosis not present

## 2023-10-10 DIAGNOSIS — D689 Coagulation defect, unspecified: Secondary | ICD-10-CM

## 2023-10-10 NOTE — Progress Notes (Signed)
  Subjective:  Patient ID: Christopher Birkenhead., male    DOB: 10-07-1956,   MRN: [id number]  Chief Complaint  Patient presents with   Nail Problem    rfc    67 y.o. male presents for thickened elongated and painful toenails that he cannot trim himself. Has a history of congestive heart failure and is currently on Eliquis. Relates burning and tingling in their feet. Patient is diabetic and last A1c was  Lab Results  Component Value Date   HGBA1C 6.0 (H) 12/23/2022   .   PCP:  Loura Back, NP    Denies any other pedal complaints. Denies n/v/f/c.   Past Medical History:  Diagnosis Date   Atrial fibrillation (HCC)    CHF (congestive heart failure) (HCC)    Diabetes (HCC)    Dyspnea    Elevated LFTs    Fatty liver    Hypertension    Insomnia    Prediabetes    Sleep apnea    SOB (shortness of breath)    Tubular adenoma of colon    Vitamin D deficiency     Objective:  Physical Exam: Vascular: DP/PT pulses 2/4 bilateral. CFT <3 seconds. Normal hair growth on digits. No edema.  Skin. No lacerations or abrasions bilateral feet. Nails 1-5 are thickened discolored and elongated with subungual debris.  Musculoskeletal: MMT 5/5 bilateral lower extremities in DF, PF, Inversion and Eversion. Deceased ROM in DF of ankle joint.  Neurological: Sensation intact to light touch. Protective sensation intact.   Assessment:   1. Pain due to onychomycosis of toenails of both feet   2. Type 2 diabetes mellitus with other specified complication, unspecified whether long term insulin use (HCC)   3. Coagulation disorder (HCC)        Plan:  Patient was evaluated and treated and all questions answered. -Discussed and educated patient on diabetic  foot care, especially with  regards to the vascular, neurological and musculoskeletal systems.  -Discussed supportive shoes at all times and checking feet regularly.  -Mechanically debrided all nails 1-5 bilateral using sterile nail nipper and filed with  dremel without incident  -Answered all patient questions -Patient to return  in 3 months for at risk foot care -Patient advised to call the office if any problems or questions arise in the meantime.   Louann Sjogren, DPM

## 2023-10-24 ENCOUNTER — Other Ambulatory Visit: Payer: Self-pay | Admitting: Cardiology

## 2023-10-30 NOTE — Progress Notes (Unsigned)
SUBJECTIVE:  Chief Complaint: Obesity  Interim History: He is down 1 lb since his last visit  Down 19 lbs overall TBW loss of 7.51% Christopher Wilkins, a 68 year old individual with a history of obesity, type 2 diabetes, hyperlipidemia, and congestive heart failure, was recently restarted on Ozempic by his primary care physician. The medication had previously been discontinued due to consistent under-eating and meal skipping, which led to some weight regain.  Since restarting Ozempic, the patient reports a decrease in appetite and a tendency to skip meals. He describes his daily food intake as sporadic, often consisting of an egg sandwich in the morning and another sandwich in the afternoon. Despite this, he has managed to lose a pound. We discussed the importance of regular meals and adequate protein intake for weight loss and overall health. We discussed trying to focus on adequate protein intake.   The patient also reports a decrease in physical activity, specifically walking, due to childcare responsibilities. He expresses a desire to resume this activity, which he believes was beneficial to his health.  The patient has also noticed some blurriness in his vision, particularly when reading. He has not had an eye exam in over a year and has never had a diabetic eye exam. He also reports occasional ankle pain, which he has noticed tends to occur after consuming tomatoes.  The patient is currently taking a 0.25 dose of Ozempic and checks his blood sugar every other day, which typically ranges between 89 and 91. He has not noticed any significant lows in his blood sugar. Despite the decrease in appetite and sporadic meal intake, the patient has managed to maintain his muscle mass fairly well. He expresses a willingness to increase his intake of vegetables and protein to improve his nutrition.  Christopher Wilkins is here to discuss his progress with his obesity treatment plan. He is on the Category 3 Plan and states  he is following his eating plan approximately 100 % of the time. He states he is not exercising 0 minutes 0 times per week.   OBJECTIVE: Visit Diagnoses: Problem List Items Addressed This Visit     CHF (congestive heart failure) (HCC)   Type 2 diabetes mellitus with other specified complication (HCC) - Primary   Relevant Orders   Ambulatory referral to Ophthalmology   Other hyperlipidemia   Obesity- Start BMI 38.47   BMI 35.0-35.9,adult   Other Visit Diagnoses       At risk for change of vision         Obesity 68 year old with obesity, currently on Ozempic 0.25 mg. Lost 19 pounds but experiences decreased appetite and occasional meal skipping. Discussed risks of further appetite suppression and potential for low metabolism. We discussed the importance of regular meals and adequate protein intake for weight loss and overall health.  Emphasized balance between weight loss and adequate nutrition. Encouraged resuming regular walking for additional benefits. - Continue Ozempic 0.25 mg - Monitor appetite and meal frequency - Encourage regular walking - Follow up in one month  Type 2 Diabetes Mellitus Reports taking Ozempic 0.25 mg weekly. On metformin 500 mg daily and Farxiga 10 mg daily for HF as well as Entresto.  Denies mass in neck, dysphagia, dyspepsia, persistent hoarseness, abdominal pain, or N/V/Constipation or diarrhea. Has annual eye exam. Mood is stable. Denies side effects from other medications.  We discussed the importance of regular meals and adequate protein intake for weight loss and overall health. Would not recommend increasing Ozempic at this time as  getting significant appetite suppression on very small dose.  Type 2 diabetes with blood sugars typically between 89-91 mg/dL, checked every other day. No episodes of hypoglycemia reported. Discussed importance of consistent meal intake to avoid hypoglycemia. - Continue current diabetes management - Monitor blood sugars  regularly Continue working  on nutrition plan to decrease simple carbohydrates, increase lean proteins and exercise to promote weight loss and improve glycemic control .  Diabetic Retinopathy Screening Reports blurry vision and has not had a diabetic eye exam. Needs referral to ophthalmology for a comprehensive diabetic eye exam. - Refer to ophthalmology for diabetic eye exam  Hyperlipidemia LDL is not at goal. Medication(s): Lipitor 80 mg daily.  Increased over the past 6 months. No side effects.  Cardiovascular risk factors: advanced age (older than 25 for men, 62 for women), diabetes mellitus, dyslipidemia, hypertension, male gender, and obesity (BMI >= 30 kg/m2)  Lab Results  Component Value Date   CHOL 175 12/23/2022   HDL 50 12/23/2022   LDLCALC 106 (H) 12/23/2022   TRIG 104 12/23/2022   CHOLHDL 3.2 06/19/2020   CHOLHDL 4.3 Ratio 04/03/2010   CHOLHDL 3.6 Ratio 08/08/2009   Lab Results  Component Value Date   ALT 32 07/19/2023   AST 25 07/19/2023   ALKPHOS 104 07/19/2023   BILITOT 0.2 07/19/2023   The 10-year ASCVD risk score (Arnett DK, et al., 2019) is: 47.8%   Values used to calculate the score:     Age: 68 years     Sex: Male     Is Non-Hispanic African American: Yes     Diabetic: Yes     Tobacco smoker: Yes     Systolic Blood Pressure: 131 mmHg     Is BP treated: Yes     HDL Cholesterol: 50 mg/dL     Total Cholesterol: 175 mg/dL  Plan: Continue statin. Continue to work on nutrition plan -decreasing simple carbohydrates, increasing lean proteins, decreasing saturated fats and cholesterol , avoiding trans fats and exercise as able to promote weight loss, improve lipids and decrease cardiovascular risks. Also on Ozempic which should help decrease CV risks.    Congestive Heart Failure Congestive heart failure. No current symptoms of dyspnea or leg swelling. Discussed importance of monitoring sodium intake. Recommended low-sodium or no-sodium canned vegetables and  potential benefits of frozen vegetables. - Monitor sodium intake, aim for less than 2 grams per day - Encourage low-sodium or no-sodium canned vegetables - Prefer frozen vegetables over canned  General Health Maintenance Needs to ensure consistent meal intake and balanced nutrition, including protein and vegetables. Discussed the importance of low-sodium diet and potential benefits of frozen vegetables over canned. Encouraged increasing vegetable intake and monitoring sodium intake. - Encourage consistent meal intake - Increase vegetable intake, prefer frozen over canned - Monitor sodium intake  Follow-up - Schedule follow-up appointment for Monday, February 17th at 8 AM.  Vitals Temp: 98 F (36.7 C) BP: 131/83 Pulse Rate: 94 SpO2: 98 %   Anthropometric Measurements Height: 5\' 8"  (1.727 m) Weight: 234 lb (106.1 kg) BMI (Calculated): 35.59 Weight at Last Visit: 235 lb Weight Lost Since Last Visit: 0 lb Weight Gained Since Last Visit: 0 Starting Weight: 253 lb Total Weight Loss (lbs): 19 lb (8.618 kg) Peak Weight: 275 lb   Body Composition  Body Fat %: 34 % Fat Mass (lbs): 79.6 lbs Muscle Mass (lbs): 147.2 lbs Total Body Water (lbs): 102.6 lbs Visceral Fat Rating : 21   Other Clinical Data Fasting: yes Labs:  no Today's Visit #: 21 Starting Date: 12/31/21     ASSESSMENT AND PLAN:  Diet: Christopher Wilkins is currently in the action stage of change. As such, his goal is to continue with weight loss efforts. He has agreed to Category 3 Plan.  Exercise: Christopher Wilkins has been instructed to try a geriatric exercise plan and that some exercise is better than none for weight loss and overall health benefits.   Behavior Modification:  We discussed the following Behavioral Modification Strategies today: increasing lean protein intake, decreasing simple carbohydrates, increasing vegetables, increase H2O intake, increase high fiber foods, no skipping meals, meal planning and cooking  strategies, and planning for success. We discussed various medication options to help Christopher Wilkins with his weight loss efforts and we both agreed to continue Ozempic at 0.25 mg weekly for Type 2 diabetes/continue current medical regimen otherwise and continue to work on nutritional and behavioral strategies to promote weight loss.    Return in about 4 weeks (around 11/28/2023).Marland Kitchen He was informed of the importance of frequent follow up visits to maximize his success with intensive lifestyle modifications for his multiple health conditions.  Attestation Statements:   Reviewed by clinician on day of visit: allergies, medications, problem list, medical history, surgical history, family history, social history, and previous encounter notes.   Time spent on visit including pre-visit chart review and post-visit care and charting was 37 minutes.    Aaradhya Kysar, PA-C

## 2023-10-31 ENCOUNTER — Encounter (INDEPENDENT_AMBULATORY_CARE_PROVIDER_SITE_OTHER): Payer: Self-pay | Admitting: Physician Assistant

## 2023-10-31 ENCOUNTER — Ambulatory Visit (INDEPENDENT_AMBULATORY_CARE_PROVIDER_SITE_OTHER): Payer: 59 | Admitting: Physician Assistant

## 2023-10-31 VITALS — BP 131/83 | HR 94 | Temp 98.0°F | Ht 68.0 in | Wt 234.0 lb

## 2023-10-31 DIAGNOSIS — Z9189 Other specified personal risk factors, not elsewhere classified: Secondary | ICD-10-CM

## 2023-10-31 DIAGNOSIS — E559 Vitamin D deficiency, unspecified: Secondary | ICD-10-CM

## 2023-10-31 DIAGNOSIS — I509 Heart failure, unspecified: Secondary | ICD-10-CM

## 2023-10-31 DIAGNOSIS — E669 Obesity, unspecified: Secondary | ICD-10-CM

## 2023-10-31 DIAGNOSIS — E7849 Other hyperlipidemia: Secondary | ICD-10-CM | POA: Diagnosis not present

## 2023-10-31 DIAGNOSIS — Z7984 Long term (current) use of oral hypoglycemic drugs: Secondary | ICD-10-CM

## 2023-10-31 DIAGNOSIS — E1169 Type 2 diabetes mellitus with other specified complication: Secondary | ICD-10-CM

## 2023-10-31 DIAGNOSIS — Z7985 Long-term (current) use of injectable non-insulin antidiabetic drugs: Secondary | ICD-10-CM

## 2023-10-31 DIAGNOSIS — Z6835 Body mass index (BMI) 35.0-35.9, adult: Secondary | ICD-10-CM

## 2023-11-28 ENCOUNTER — Encounter (INDEPENDENT_AMBULATORY_CARE_PROVIDER_SITE_OTHER): Payer: Self-pay | Admitting: Physician Assistant

## 2023-11-28 ENCOUNTER — Ambulatory Visit (INDEPENDENT_AMBULATORY_CARE_PROVIDER_SITE_OTHER): Payer: 59 | Admitting: Physician Assistant

## 2023-11-28 VITALS — BP 135/81 | HR 90 | Temp 97.7°F | Ht 68.0 in | Wt 235.0 lb

## 2023-11-28 DIAGNOSIS — I509 Heart failure, unspecified: Secondary | ICD-10-CM

## 2023-11-28 DIAGNOSIS — E1169 Type 2 diabetes mellitus with other specified complication: Secondary | ICD-10-CM | POA: Diagnosis not present

## 2023-11-28 DIAGNOSIS — E669 Obesity, unspecified: Secondary | ICD-10-CM

## 2023-11-28 DIAGNOSIS — E7849 Other hyperlipidemia: Secondary | ICD-10-CM | POA: Diagnosis not present

## 2023-11-28 DIAGNOSIS — E559 Vitamin D deficiency, unspecified: Secondary | ICD-10-CM | POA: Diagnosis not present

## 2023-11-28 DIAGNOSIS — Z6835 Body mass index (BMI) 35.0-35.9, adult: Secondary | ICD-10-CM

## 2023-11-28 DIAGNOSIS — Z7984 Long term (current) use of oral hypoglycemic drugs: Secondary | ICD-10-CM

## 2023-11-28 NOTE — Progress Notes (Signed)
SUBJECTIVE: Discussed the use of AI scribe software for clinical note transcription with the patient, who gave verbal consent to proceed.  Chief Complaint: Obesity  Interim History: He is  up 1 lb since last visit.  Bio impedence scale reviewed with the patient:  Muscle mass + 6 lbs Adipose mass - 5.8 lbs Water weight + 3.2 lbs   Christopher Wilkins is here to discuss his progress with his obesity treatment plan. He is on the Category 3 Plan and states he is following his eating plan approximately 50 % of the time. He states he is exercising walking 60 minutes 6 times per week.  Christopher Wilkins. is a 67 year old male with obesity who presents for follow-up of his obesity treatment plan.  He has been focusing on weight management and notes a slight increase in weight, but has gained almost six pounds of muscle due to his walking routine. He is on a very low dose of Ozempic (0.25 mg) due to sensitivity, which effectively manages his appetite without excessive suppression. He maintains a structured eating schedule, including breakfast, a [place] sandwich around noon, and a dinner of chicken and vegetables. He feels satisfied with his meals and has not experienced nausea, vomiting, diarrhea, or constipation with Ozempic.  He has a history of type 2 diabetes, hyperlipidemia, congestive heart failure, and vitamin D deficiency. His current medications include Eliquis 5 mg twice daily, Lipitor 80 mg at night, Vitamin D 2000 units daily, Farxiga 10 mg daily, Entresto 49/51 mg twice daily, Lasix 80 mg daily, Metformin 500 mg daily, and Metoprolol 25 mg daily. He was recently restarted on Ozempic. No swelling in his legs and his breathing has been good. He is compliant with his medication regimen.  He recently traveled to [facility] to support his niece who underwent a kidney transplant. He has maintained his walking routine by walking inside the hospital and has been mindful of his diet while traveling, opting for  [employer] sandwiches. He plans to stay with his niece until she is discharged, expected by the end of the week. OBJECTIVE: Visit Diagnoses: Problem List Items Addressed This Visit     CHF (congestive heart failure) (HCC)   Type 2 diabetes mellitus with other specified complication (HCC) - Primary   Vitamin D deficiency   Other hyperlipidemia   Obesity- Start BMI 38.47   BMI 35.0-35.9,adult  Obesity Obesity managed with diet, exercise, and Ozempic (0.25 mg). Weight increase attributed to muscle gain from increased walking. Current dose effective in controlling appetite and promoting weight loss. Advised against dose increase to avoid side effects (nausea, vomiting, constipation). - Continue Ozempic 0.25 mg - Maintain current diet and exercise regimen - Follow up in one month to reassess weight and muscle gain   Congestive Heart Failure Christopher Wilkins' heart failure is well-managed with Entresto, Lasix, and metoprolol. No recent dyspnea or edema reported. Follows regularly with cardiology.  - Continue Entresto 49/51 mg BID - Continue Lasix 80 mg daily - Continue metoprolol 25 mg daily - Monitor for fluid retention and dyspnea  Type 2 Diabetes Mellitus Type 2 diabetes is well-controlled with metformin and Farxiga and recent resumption of Ozempic. No hyperglycemia or hypoglycemia reported. - Continue metformin 500 mg daily - Continue Farxiga 10 mg daily Would favor continuing very low dose Ozempic 0.25 mg weekly rather than increasing at this time as his appetite was significantly decreased and he was frequently skipping meals on Ozempic 0.5 mg weekly previously.  He is more  active now, walking regularly and has been increasing muscle mass. Will follow closely and increase if indicated.     Hyperlipidemia Hyperlipidemia managed with Lipitor. No recent lipid panel results discussed. - Continue Lipitor 80 mg QPM    Vitamin D Deficiency Vitamin D deficiency managed with  supplementation. No recent lab results discussed. - Continue vitamin D 2000 units daily  General Health Maintenance Advised to maintain current health routines, including diet, exercise, and medication adherence. Cautioned about medication storage and travel. - Continue current health routines - Ensure proper storage of medications, especially Ozempic - Be cautious while traveling and maintain regular physical activity  Follow-up - Follow up in one month (March 19th at 8:00 AM).  Vitals Temp: 97.7 F (36.5 C) BP: 135/81 Pulse Rate: 90 SpO2: 96 %   Anthropometric Measurements Height: 5\' 8"  (1.727 m) Weight: 235 lb (106.6 kg) BMI (Calculated): 35.74 Weight at Last Visit: 235 lb Weight Lost Since Last Visit: 0 Weight Gained Since Last Visit: 1 lb Starting Weight: 253 lb Total Weight Loss (lbs): 18 lb (8.165 kg) Peak Weight: 275 lb   Body Composition  Body Fat %: 31.4 % Fat Mass (lbs): 73.8 lbs Muscle Mass (lbs): 153.2 lbs Total Body Water (lbs): 105.8 lbs Visceral Fat Rating : 20   Other Clinical Data Fasting: no Labs: no Today's Visit #: 22 Starting Date: 12/31/21     ASSESSMENT AND PLAN:  Diet: Christopher Wilkins is currently in the action stage of change. As such, his goal is to continue with weight loss efforts. He has agreed to Category 3 Plan.  Exercise: Christopher Wilkins has been instructed to work up to a goal of 150 minutes of combined cardio and strengthening exercise per week for weight loss and overall health benefits.   Behavior Modification:  We discussed the following Behavioral Modification Strategies today: increasing lean protein intake, decreasing simple carbohydrates, increasing vegetables, decreasing sodium intake, increase high fiber foods, no skipping meals, meal planning and cooking strategies, travel eating strategies, avoiding temptations, and planning for success. 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  Return in about 6 weeks (around 01/09/2024).Marland Kitchen He was informed of the importance of frequent follow up visits to maximize his success with intensive lifestyle modifications for his multiple health conditions.  Attestation Statements:   Reviewed by clinician on day of visit: allergies, medications, problem list, medical history, surgical history, family history, social history, and previous encounter notes.   Time spent on visit including pre-visit chart review and post-visit care and charting was 31 minutes.    Christopher Bultman, PA-C

## 2023-12-27 NOTE — Progress Notes (Unsigned)
 SUBJECTIVE: Discussed the use of AI scribe software for clinical note transcription with the patient, who gave verbal consent to proceed.  Chief Complaint: Obesity  Interim History: He is down 1 lb since last visit Down 19 lbs overall TBW loss of 7.51%  Christopher Wilkins is here to discuss his progress with his obesity treatment plan. He is on the Category 3 Plan and states he is following his eating plan approximately 80-90 % of the time. He states he is exercising mall walking 30 minutes 5-7 times per week.  Christopher Bertha. is a 68 year old male with obesity, type 2 diabetes, and chronic heart failure who presents for follow-up of his obesity treatment plan.  He is currently on Ozempic 0.25 mg, which has led to a decrease in appetite. He eats three meals a day with smaller portions in the evening. He is actively involved with his grandchildren and participates in activities at the [employer], including playing bingo and flying drones. He was previously involved in teaching karate.  He experiences pain in his left ankle after walking for about thirty minutes, and feeling like water is building up around his knee. This pain has been present for many months but has recently increased, causing him to stop walking and prompting him to purchase a cane for support. No redness or fever associated with his joint pain. He has not discussed these symptoms with his regular doctor.  He has type 2 diabetes and is currently taking Ozempic at 0.25 mg and metformin for diabetic control. His blood sugar levels are running between 89 and 92. He reports a decrease in appetite since re-starting Ozempic.  He has chronic heart failure and is on Farxiga 10 mg daily, Entresto 40 mg, metoprolol 25 mg daily, and Lasix 80 mg daily. No recent changes in his heart failure symptoms.  He has hypercholesterolemia and is on Lipitor 80 mg once daily. He also has hypertension and is on Eliquis 5 mg twice a day.  He  experiences chronic respiratory issues and uses inhalers, including Wixela, one puff twice daily. No recent breathing difficulties and no issues with pollen or allergies.  He uses a CPAP machine at night but has never seen a CPAP doctor. He uses the machine regularly but has not been shown how to operate it properly. No fevers or chills.  He experiences slight headaches in the morning and describes a sensation of being 'stabbed with a pen' in his side, occurring two to three times a week. OBJECTIVE: Visit Diagnoses: Problem List Items Addressed This Visit     CHF (congestive heart failure) (HCC)   Type 2 diabetes mellitus with other specified complication (HCC) - Primary   Vitamin D deficiency   Other hyperlipidemia   Obesity- Start BMI 38.47   BMI 35.0-35.9,adult   Other Visit Diagnoses       Chronic pain of left ankle       Relevant Orders   Ambulatory referral to Sports Medicine      Obesity He is on Ozempic 0.25 mg, which has helped reduce his appetite. He is eating three meals a day, but there is concern about metabolic slowdown if he does not consume enough food. He has lost another pound, but his muscle mass is slightly down, likely due to decreased physical activity from leg pain. Discussed not increasing Ozempic at this time as previously when we increased, he was frequently skipping meals and not meeting his protein goals.  - Continue Ozempic  0.25 mg - Encourage adequate protein intake and regular meals to maintain metabolism  Type 2 Diabetes Mellitus He is on Ozempic and metformin for diabetic control. His blood sugars are well-controlled, running between 89 and 92 mg/dL. - Continue current diabetic medications (Ozempic and metformin) - Monitor blood sugar levels regularly Lab Results  Component Value Date   HGBA1C 6.0 (H) 12/23/2022   HGBA1C 6.5 (H) 12/31/2021   HGBA1C 5.9 (H) 08/17/2020   Lab Results  Component Value Date   MICROALBUR 0.84 04/03/2010   LDLCALC  106 (H) 12/23/2022   CREATININE 1.02 07/19/2023   Lab Results  Component Value Date   NA 143 07/19/2023   CL 105 07/19/2023   K 4.2 07/19/2023   CO2 23 07/19/2023   BUN 15 07/19/2023   CREATININE 1.02 07/19/2023   EGFR 80 07/19/2023   CALCIUM 9.0 07/19/2023   PHOS 3.4 04/19/2019   ALBUMIN 3.9 07/19/2023   GLUCOSE 102 (H) 07/19/2023   He is working  on nutrition plan to decrease simple carbohydrates, increase lean proteins and exercise to promote weight loss and improve glycemic control . Will not increase Ozempic beyond 0.25 mg weekly at this time. Continue metformin at current dose   Left Ankle and Knee Pain He reports chronic left ankle and knee pain with recent increase in symptoms and decreased functional status- Has to stop his mall walking and take a break due to increased ankle>> knee pain lately. We discussed the pain may be due to arthritis or  neuropathy vs. Vascular in nature. He does report a history of previous ankle and knee issues over the years, so suspect may be arthritic in nature. Has not had any imaging. He has not seen a regular doctor or orthopedic specialist for this issue. - Refer to specialist for evaluation of left ankle and knee pain- sports medicine - Consider x-rays to assess for arthritis or other issues - Advise rest if pain increases during activity  Chronic Heart Failure He is on Farxiga 10 mg daily, Entresto 40 mg, metoprolol 25 mg daily, and Lasix 80 mg daily for heart failure management. Denies any chest pain, increased work of breathing or excess SOB, increased swelling , but does note some localized left  knee and ankle swelling at times with increased pain as noted above. No palpitations, no increased wheezing or other concerning symptoms today.  - Continue current heart failure medications (Farxiga, Entresto, metoprolol, Lasix) Continue to work on nutrition plan to promote weight loss.    Hypertension He is on multiple medications that also  manage his hypertension, including metoprolol and Lasix. - Continue current medications that manage hypertension  Hypercholesterolemia He is on Lipitor 80 mg once daily for hypercholesterolemia. - Continue Lipitor 80 mg once daily  Chronic Respiratory Issues/COPD He has chronic respiratory issues and uses inhalers, including Wixela, one puff twice daily. He reports no breathing difficulties or issues with pollen. - Continue Wixela inhaler as prescribed  Morning Headaches/ OSA using CPAP He experiences slight headaches in the morning. He uses a CPAP machine but has never seen a CPAP doctor. The headaches may be due to improper CPAP settings, leading to possible sleep apnea episodes. -Patient to follow up with PCP and may need to refer to CPAP specialist for evaluation and adjustment of CPAP settings as might not be optimized and may be contributing to morning headaches. He is going to schedule follow up with PCP.   Follow-up He is scheduled for a follow-up appointment in four weeks.  He prefers early morning appointments. - Schedule follow-up appointment for Monday, April 28th at 8:00 AM Vitals Temp: 97.8 F (36.6 C) BP: 129/84 Pulse Rate: 78 SpO2: 96 %   Anthropometric Measurements Height: 5\' 8"  (1.727 m) Weight: 234 lb (106.1 kg) BMI (Calculated): 35.59 Weight at Last Visit: 235lb Weight Lost Since Last Visit: 1lb Weight Gained Since Last Visit: 0 Starting Weight: 253lb Total Weight Loss (lbs): 19 lb (8.618 kg) Peak Weight: 275lb   Body Composition  Body Fat %: 34.5 % Fat Mass (lbs): 80.8 lbs Muscle Mass (lbs): 145.6 lbs Total Body Water (lbs): 101.8 lbs Visceral Fat Rating : 22   Other Clinical Data Fasting: yes Labs: no Today's Visit #: 23 Starting Date: 12/31/21     ASSESSMENT AND PLAN:  Diet: Tripp is currently in the action stage of change. As such, his goal is to continue with weight loss efforts and has agreed to the Category 3 Plan.    Exercise:  Older adults should follow the adult guidelines. When older adults cannot meet the adult guidelines, they should be as physically active as their abilities and conditions will allow., Older adults should do exercises that maintain or improve balance if they are at risk of falling. , and Older adults should determine their level of effort for physical activity relative to their level of fitness.  Behavior Modification:  We discussed the following Behavioral Modification Strategies today: increasing lean protein intake, decreasing simple carbohydrates, increasing vegetables, increase high fiber foods, no skipping meals, avoiding temptations, and planning for success. We discussed various medication options to help Bao with his weight loss efforts and we both agreed to continue Ozempic at 0.25 mg weekly for TYpe 2 diabetes management as well as to promote gradual healthy weight loss. He reports eating 3 times daily currently and would not recommend going up on Ozempic at this time as appears to be doing well on current dose, do not want further loss of muscle mass and bone mass and not having excessive appetite suppression .  Return in about 4 weeks (around 01/25/2024).Marland Kitchen He was informed of the importance of frequent follow up visits to maximize his success with intensive lifestyle modifications for his multiple health conditions.  Attestation Statements:   Reviewed by clinician on day of visit: allergies, medications, problem list, medical history, surgical history, family history, social history, and previous encounter notes.   Time spent on visit including pre-visit chart review and post-visit care and charting was 33 minutes Christopher Cosner,PA-C

## 2023-12-28 ENCOUNTER — Ambulatory Visit (INDEPENDENT_AMBULATORY_CARE_PROVIDER_SITE_OTHER): Payer: 59 | Admitting: Physician Assistant

## 2023-12-28 ENCOUNTER — Encounter (INDEPENDENT_AMBULATORY_CARE_PROVIDER_SITE_OTHER): Payer: Self-pay | Admitting: Physician Assistant

## 2023-12-28 VITALS — BP 129/84 | HR 78 | Temp 97.8°F | Ht 68.0 in | Wt 234.0 lb

## 2023-12-28 DIAGNOSIS — E1169 Type 2 diabetes mellitus with other specified complication: Secondary | ICD-10-CM | POA: Diagnosis not present

## 2023-12-28 DIAGNOSIS — I509 Heart failure, unspecified: Secondary | ICD-10-CM | POA: Diagnosis not present

## 2023-12-28 DIAGNOSIS — E559 Vitamin D deficiency, unspecified: Secondary | ICD-10-CM

## 2023-12-28 DIAGNOSIS — G4733 Obstructive sleep apnea (adult) (pediatric): Secondary | ICD-10-CM

## 2023-12-28 DIAGNOSIS — Z6835 Body mass index (BMI) 35.0-35.9, adult: Secondary | ICD-10-CM

## 2023-12-28 DIAGNOSIS — M25572 Pain in left ankle and joints of left foot: Secondary | ICD-10-CM

## 2023-12-28 DIAGNOSIS — Z7985 Long-term (current) use of injectable non-insulin antidiabetic drugs: Secondary | ICD-10-CM

## 2023-12-28 DIAGNOSIS — E7849 Other hyperlipidemia: Secondary | ICD-10-CM | POA: Diagnosis not present

## 2023-12-28 DIAGNOSIS — G8929 Other chronic pain: Secondary | ICD-10-CM

## 2023-12-28 DIAGNOSIS — E669 Obesity, unspecified: Secondary | ICD-10-CM

## 2024-01-03 NOTE — Progress Notes (Unsigned)
    Aleen Sells D.[facility] [address] [place] [address] Phone: [phone]   Assessment and Plan:     There are no diagnoses linked to this encounter.  ***   Pertinent previous records reviewed include ***    Follow Up: ***     Subjective:   I, Jerian Morais, am serving as a Neurosurgeon for Doctor Richardean Sale  Chief Complaint: left ankle   HPI:   01/04/2024 Patient is a 68 year old male with left ankle pain. Patient states   Relevant Historical Information: ***  Additional pertinent review of systems negative.   Current Outpatient Medications:    albuterol (VENTOLIN HFA) 108 (90 Base) MCG/ACT inhaler, INHALE 1-2 PUFFS INTO THE LUNGS EVERY 8 (EIGHT) HOURS AS NEEDED FOR WHEEZING OR SHORTNESS OF BREATH., Disp: 8.5 each, Rfl: 0   apixaban (ELIQUIS) 5 MG TABS tablet, TAKE 1 TABLET (5 MG TOTAL) BY MOUTH 2 (TWO) TIMES DAILY., Disp: 60 tablet, Rfl: 0   atorvastatin (LIPITOR) 80 MG tablet, Take 80 mg by mouth every evening., Disp: , Rfl:    blood glucose meter kit and supplies KIT, Dispense based on patient and insurance preference. Use up to two times daily as directed., Disp: 1 each, Rfl: 0   Blood Glucose Monitoring Suppl Supplies MISC, 1 strip by Does not apply route 2 (two) times daily., Disp: 100 strip, Rfl: 0   Blood Pressure Monitor DEVI, Please provide patient with insurance approved blood pressure monitor I10.0, Disp: 1 each, Rfl: 0   cetirizine (ZYRTEC) 10 MG tablet, Take 10 mg by mouth at bedtime., Disp: , Rfl:    Cholecalciferol (VITAMIN D) 50 MCG (2000 UT) tablet, Take 2,000 Units by mouth daily., Disp: , Rfl:    dapagliflozin propanediol (FARXIGA) 10 MG TABS tablet, Take 10 mg by mouth daily., Disp: , Rfl:    ENTRESTO 49-51 MG, Take 1 tablet by mouth 2 (two) times daily., Disp: , Rfl:    furosemide (LASIX) 80 MG tablet, Take 80 mg by mouth., Disp: , Rfl:    metFORMIN (GLUCOPHAGE-XR) 500 MG 24 hr tablet, Take 500 mg by  mouth daily., Disp: , Rfl:    metoprolol succinate (TOPROL-XL) 25 MG 24 hr tablet, Take 25 mg by mouth daily., Disp: , Rfl:    Misc. Devices (GNP DIGITAL WEIGHT SCALE) MISC, 1 Device by Does not apply route daily. Please provide patient with insurance approved scale ICD110 I50.2, Disp: 1 each, Rfl: 0   OZEMPIC, 0.25 OR 0.5 MG/DOSE, 2 MG/3ML SOPN, INJECT 0.25 SUBCUTANEOUSLY ONCE WEEKLY FOR 4 WEEKS THEN INCREASE TO 0.5 MG WEEKLY, Disp: , Rfl:    potassium chloride SA (KLOR-CON M20) 20 MEQ tablet, TAKE 1 TABLET BY MOUTH DAILY. PLEASE CONTACT THE OFFICE TO SCHEDULE A FOLLOW UP APPT 1ST ATTEMPT, Disp: 90 tablet, Rfl: 3   sennosides-docusate sodium (SENOKOT-S) 8.6-50 MG tablet, Take 1 tablet by mouth 2 (two) times daily as needed for constipation., Disp: , Rfl:    WIXELA INHUB 100-50 MCG/ACT AEPB, Inhale 1 puff into the lungs 2 (two) times daily., Disp: , Rfl:    Objective:     There were no vitals filed for this visit.    There is no height or weight on file to calculate BMI.    Physical Exam:    ***   Electronically signed by:  Aleen Sells D.[facility] 7:35 AM 01/03/24

## 2024-01-04 ENCOUNTER — Ambulatory Visit (INDEPENDENT_AMBULATORY_CARE_PROVIDER_SITE_OTHER)

## 2024-01-04 ENCOUNTER — Ambulatory Visit (INDEPENDENT_AMBULATORY_CARE_PROVIDER_SITE_OTHER): Admitting: Sports Medicine

## 2024-01-04 VITALS — BP 120/80 | HR 88 | Ht 68.0 in | Wt 237.0 lb

## 2024-01-04 DIAGNOSIS — M25572 Pain in left ankle and joints of left foot: Secondary | ICD-10-CM

## 2024-01-04 DIAGNOSIS — G8929 Other chronic pain: Secondary | ICD-10-CM | POA: Diagnosis not present

## 2024-01-04 DIAGNOSIS — M19072 Primary osteoarthritis, left ankle and foot: Secondary | ICD-10-CM

## 2024-01-04 NOTE — Patient Instructions (Addendum)
 HEP. Refer to PT. Recommend taking tylenol 500 to 1000 mg before going on walks. Recommend wearing comfortable athletic shoes while on walks. Follow up in 6 weeks.

## 2024-01-09 ENCOUNTER — Ambulatory Visit (INDEPENDENT_AMBULATORY_CARE_PROVIDER_SITE_OTHER): Payer: 59 | Admitting: Podiatry

## 2024-01-09 DIAGNOSIS — B351 Tinea unguium: Secondary | ICD-10-CM

## 2024-01-09 DIAGNOSIS — M79674 Pain in right toe(s): Secondary | ICD-10-CM

## 2024-01-09 DIAGNOSIS — M79675 Pain in left toe(s): Secondary | ICD-10-CM

## 2024-01-09 DIAGNOSIS — D689 Coagulation defect, unspecified: Secondary | ICD-10-CM

## 2024-01-09 DIAGNOSIS — E1169 Type 2 diabetes mellitus with other specified complication: Secondary | ICD-10-CM | POA: Diagnosis not present

## 2024-01-09 NOTE — Progress Notes (Signed)
  Subjective:  Patient ID: Christopher Wilkins., male    DOB: 06-29-1956,   MRN: [id number]  Chief Complaint  Patient presents with   Nail Problem    RFC    68 y.o. male presents for thickened elongated and painful toenails that he cannot trim himself. Has a history of congestive heart failure and is currently on Eliquis. Relates burning and tingling in their feet. Patient is diabetic and last A1c was  Lab Results  Component Value Date   HGBA1C 6.0 (H) 12/23/2022   .   PCP:  Loura Back, NP    Denies any other pedal complaints. Denies n/v/f/c.   Past Medical History:  Diagnosis Date   Atrial fibrillation (HCC)    CHF (congestive heart failure) (HCC)    Diabetes (HCC)    Dyspnea    Elevated LFTs    Fatty liver    Hypertension    Insomnia    Prediabetes    Sleep apnea    SOB (shortness of breath)    Tubular adenoma of colon    Vitamin D deficiency     Objective:  Physical Exam: Vascular: DP/PT pulses 2/4 bilateral. CFT <3 seconds. Normal hair growth on digits. No edema.  Skin. No lacerations or abrasions bilateral feet. Nails 1-5 are thickened discolored and elongated with subungual debris.  Musculoskeletal: MMT 5/5 bilateral lower extremities in DF, PF, Inversion and Eversion. Deceased ROM in DF of ankle joint.  Neurological: Sensation intact to light touch. Protective sensation intact.   Assessment:   1. Pain due to onychomycosis of toenails of both feet   2. Type 2 diabetes mellitus with other specified complication, unspecified whether long term insulin use (HCC)   3. Coagulation disorder (HCC)         Plan:  Patient was evaluated and treated and all questions answered. -Discussed and educated patient on diabetic  foot care, especially with  regards to the vascular, neurological and musculoskeletal systems.  -Discussed supportive shoes at all times and checking feet regularly.  -Mechanically debrided all nails 1-5 bilateral using sterile nail nipper and filed  with dremel without incident  -Answered all patient questions -Patient to return  in 3 months for at risk foot care -Patient advised to call the office if any problems or questions arise in the meantime.   Louann Sjogren, DPM

## 2024-01-16 ENCOUNTER — Encounter: Payer: Self-pay | Admitting: Sports Medicine

## 2024-01-18 ENCOUNTER — Encounter: Payer: Self-pay | Admitting: Cardiology

## 2024-01-18 ENCOUNTER — Ambulatory Visit: Payer: 59 | Attending: Cardiology | Admitting: Cardiology

## 2024-01-18 VITALS — BP 112/78 | HR 90 | Ht 68.0 in | Wt 236.6 lb

## 2024-01-18 DIAGNOSIS — I10 Essential (primary) hypertension: Secondary | ICD-10-CM

## 2024-01-18 DIAGNOSIS — I4819 Other persistent atrial fibrillation: Secondary | ICD-10-CM | POA: Diagnosis not present

## 2024-01-18 DIAGNOSIS — I5020 Unspecified systolic (congestive) heart failure: Secondary | ICD-10-CM

## 2024-01-18 DIAGNOSIS — I5032 Chronic diastolic (congestive) heart failure: Secondary | ICD-10-CM

## 2024-01-18 DIAGNOSIS — E7800 Pure hypercholesterolemia, unspecified: Secondary | ICD-10-CM

## 2024-01-18 DIAGNOSIS — I350 Nonrheumatic aortic (valve) stenosis: Secondary | ICD-10-CM | POA: Diagnosis not present

## 2024-01-18 LAB — COMPREHENSIVE METABOLIC PANEL WITH GFR
ALT: 38 IU/L (ref 0–44)
AST: 40 IU/L (ref 0–40)
Albumin: 4.1 g/dL (ref 3.9–4.9)
Alkaline Phosphatase: 109 IU/L (ref 44–121)
BUN/Creatinine Ratio: 16 (ref 10–24)
BUN: 19 mg/dL (ref 8–27)
Bilirubin Total: 0.5 mg/dL (ref 0.0–1.2)
CO2: 25 mmol/L (ref 20–29)
Calcium: 9.7 mg/dL (ref 8.6–10.2)
Chloride: 103 mmol/L (ref 96–106)
Creatinine, Ser: 1.18 mg/dL (ref 0.76–1.27)
Globulin, Total: 3.2 g/dL (ref 1.5–4.5)
Glucose: 90 mg/dL (ref 70–99)
Potassium: 4.4 mmol/L (ref 3.5–5.2)
Sodium: 142 mmol/L (ref 134–144)
Total Protein: 7.3 g/dL (ref 6.0–8.5)
eGFR: 67 mL/min/{1.73_m2} (ref >59)

## 2024-01-18 LAB — LIPID PANEL
Chol/HDL Ratio: 3 ratio (ref 0.0–5.0)
Cholesterol, Total: 160 mg/dL (ref 100–199)
HDL: 54 mg/dL (ref >39)
LDL Chol Calc (NIH): 86 mg/dL (ref 0–99)
Triglycerides: 110 mg/dL (ref 0–149)
VLDL Cholesterol Cal: 20 mg/dL (ref 5–40)

## 2024-01-18 LAB — MAGNESIUM: Magnesium: 2.1 mg/dL (ref 1.6–2.3)

## 2024-01-18 NOTE — Patient Instructions (Addendum)
 Medication Instructions:  Continue current medications *If you need a refill on your cardiac medications before your next appointment, please call your pharmacy*  Lab Work: Mg, lipid,cmet today If you have labs (blood work) drawn today and your tests are completely normal, you will receive your results only by: MyChart Message (if you have MyChart) OR A paper copy in the mail If you have any lab test that is abnormal or we need to change your treatment, we will call you to review the results.  Testing/Procedures: noe  Follow-Up: At [facility], you and your health needs are our priority.  As part of our continuing mission to provide you with exceptional heart care, our providers are all part of one team.  This team includes your primary Cardiologist (physician) and Advanced Practice Providers or APPs (Physician Assistants and Nurse Practitioners) who all work together to provide you with the care you need, when you need it.  Your next appointment:   6 month(s)  Provider:   Little Ishikawa, MD     We recommend signing up for the patient portal called "MyChart".  Sign up information is provided on this After Visit Summary.  MyChart is used to connect with patients for Virtual Visits (Telemedicine).  Patients are able to view lab/test results, encounter notes, upcoming appointments, etc.  Non-urgent messages can be sent to your provider as well.   To learn more about what you can do with MyChart, go to [email or web address].   Other Instructions Referral to sleep medicine to see Dr. Mayford Knife      1st Floor: - Lobby - Registration  - Pharmacy  - Lab - Cafe  2nd Floor: - PV Lab - Diagnostic Testing (echo, CT, nuclear med)  3rd Floor: - Vacant  4th Floor: - TCTS (cardiothoracic surgery) - AFib Clinic - Structural Heart Clinic - Vascular Surgery  - Vascular Ultrasound  5th Floor: - [facility] (general and EP) - Clinical Pharmacy for coumadin,  hypertension, lipid, weight-loss medications, and med management appointments    Valet parking services will be available as well.

## 2024-01-18 NOTE — Progress Notes (Addendum)
 Cardiology Office Note:    Date:  01/18/2024   ID:  Christopher Birkenhead., DOB 09-Jan-1956, MRN [id number]  PCP:  Loura Back, NP  Cardiologist:  Little Ishikawa, MD  Electrophysiologist:  Regan Lemming, MD   Referring MD: Loura Back, NP   No chief complaint on file.   History of Present Illness:    Christopher Litsey. is a 68 y.o. male with a hx of chronic combined systolic congestive heart failure, atrial fibrillation, tobacco use, hypertension who presents for follow-up.  He previously followed with Dr. Gala Romney in the advanced heart failure clinic.  He was admitted in September 2021 with acute combined heart failure and atrial fibrillation.  Echocardiogram showed LVEF 35 to 40%.  He underwent TEE/DCCV with restoration of sinus rhythm.  He was discharged on Eliquis, Entresto, metoprolol, and Lasix.  He was readmitted in November 2021 with acute heart failure and atrial fibrillation.  Echocardiogram showed EF was down to 20%.  He was found to be in cardiogenic shock with AKI, lactic acidosis, coox 44%.  He was started on milrinone and diuresed with IV Lasix.  RHC/LHC showed normal coronary arteries.  He was able to be weaned off milrinone and eventually underwent successful TEE/DCCV with restoration of sinus rhythm.  Hospital follow-up in December he was found to be back in atrial fibrillation.  Was referred to A. fib clinic and noted to be back in sinus rhythm.  Zio patch was placed, which showed no atrial fibrillation.  Echocardiogram on 12/05/2020 showed EF 50 to 55%, normal RV function, likely mild AS.  Echocardiogram 07/2023 showed EF 55 to 60%, normal RV function, moderate aortic stenosis.  Since last clinic visit, he reports he is doing well.  Denies any chest pain, dyspnea, lightheadedness, syncope, or palpitations.  Does report some swelling in left ankle.  He is taking Eliquis, denies any bleeding issues.  Does report he is having issues with his CPAP recently, states that he wakes up  with headache and thinks adjustments need to be made to his device.  He is smoking about 1 cigar/week.   Wt Readings from Last 3 Encounters:  01/18/24 236 lb 9.6 oz (107.3 kg)  01/04/24 237 lb (107.5 kg)  12/28/23 234 lb (106.1 kg)     Past Medical History:  Diagnosis Date   Atrial fibrillation (HCC)    CHF (congestive heart failure) (HCC)    Diabetes (HCC)    Dyspnea    Elevated LFTs    Fatty liver    Hypertension    Insomnia    Prediabetes    Sleep apnea    SOB (shortness of breath)    Tubular adenoma of colon    Vitamin D deficiency     Past Surgical History:  Procedure Laterality Date   ATRIAL FIBRILLATION ABLATION N/A 06/23/2022   Procedure: ATRIAL FIBRILLATION ABLATION;  Surgeon: Regan Lemming, MD;  Location: [facility] INVASIVE CV LAB;  Service: Cardiovascular;  Laterality: N/A;   CARDIOVERSION N/A 06/20/2020   Procedure: CARDIOVERSION;  Surgeon: Jake Bathe, MD;  Location: [facility] ENDOSCOPY;  Service: Cardiovascular;  Laterality: N/A;   CARDIOVERSION N/A 08/20/2020   Procedure: CARDIOVERSION;  Surgeon: Dolores Patty, MD;  Location: [facility] ENDOSCOPY;  Service: Cardiovascular;  Laterality: N/A;   CHOLECYSTECTOMY     OTHER SURGICAL HISTORY     "Shot a couple of times"   RIGHT/LEFT HEART CATH AND CORONARY ANGIOGRAPHY N/A 08/18/2020   Procedure: RIGHT/LEFT HEART CATH AND CORONARY ANGIOGRAPHY;  Surgeon: Arvilla Meres  R, MD;  Location: [facility] INVASIVE CV LAB;  Service: Cardiovascular;  Laterality: N/A;   TEE WITHOUT CARDIOVERSION N/A 06/20/2020   Procedure: TRANSESOPHAGEAL ECHOCARDIOGRAM (TEE);  Surgeon: Jake Bathe, MD;  Location: [facility] ENDOSCOPY;  Service: Cardiovascular;  Laterality: N/A;   TEE WITHOUT CARDIOVERSION N/A 08/20/2020   Procedure: TRANSESOPHAGEAL ECHOCARDIOGRAM (TEE);  Surgeon: Dolores Patty, MD;  Location: [facility] ENDOSCOPY;  Service: Cardiovascular;  Laterality: N/A;    Current Medications: Current Meds  Medication Sig   albuterol (VENTOLIN HFA) 108 (90  Base) MCG/ACT inhaler INHALE 1-2 PUFFS INTO THE LUNGS EVERY 8 (EIGHT) HOURS AS NEEDED FOR WHEEZING OR SHORTNESS OF BREATH.   apixaban (ELIQUIS) 5 MG TABS tablet TAKE 1 TABLET (5 MG TOTAL) BY MOUTH 2 (TWO) TIMES DAILY.   atorvastatin (LIPITOR) 80 MG tablet Take 80 mg by mouth every evening.   blood glucose meter kit and supplies KIT Dispense based on patient and insurance preference. Use up to two times daily as directed.   Blood Glucose Monitoring Suppl Supplies MISC 1 strip by Does not apply route 2 (two) times daily.   Cholecalciferol (VITAMIN D) 50 MCG (2000 UT) tablet Take 2,000 Units by mouth daily.   dapagliflozin propanediol (FARXIGA) 10 MG TABS tablet Take 10 mg by mouth daily.   ENTRESTO 49-51 MG Take 1 tablet by mouth 2 (two) times daily.   ezetimibe (ZETIA) 10 MG tablet Take 10 mg by mouth daily.   furosemide (LASIX) 80 MG tablet Take 80 mg by mouth.   metFORMIN (GLUCOPHAGE-XR) 500 MG 24 hr tablet Take 500 mg by mouth daily.   metoprolol succinate (TOPROL-XL) 25 MG 24 hr tablet Take 25 mg by mouth daily.   Misc. Devices (GNP DIGITAL WEIGHT SCALE) MISC 1 Device by Does not apply route daily. Please provide patient with insurance approved scale ICD110 I50.2   OZEMPIC, 0.25 OR 0.5 MG/DOSE, 2 MG/3ML SOPN INJECT 0.25 SUBCUTANEOUSLY ONCE WEEKLY FOR 4 WEEKS THEN INCREASE TO 0.5 MG WEEKLY   potassium chloride SA (KLOR-CON M20) 20 MEQ tablet TAKE 1 TABLET BY MOUTH DAILY. PLEASE CONTACT THE OFFICE TO SCHEDULE A FOLLOW UP APPT 1ST ATTEMPT   WIXELA INHUB 100-50 MCG/ACT AEPB Inhale 1 puff into the lungs 2 (two) times daily.     Allergies:   Codeine and Lisinopril   Social History   Socioeconomic History   Marital status: Single    Spouse name: Not on file   Number of children: Not on file   Years of education: Not on file   Highest education level: Not on file  Occupational History   Occupation: Retired  Tobacco Use   Smoking status: Some Days    Current packs/day: 0.00    Types:  Cigarettes, Cigars    Last attempt to quit: 05/14/2020    Years since quitting: 3.6   Smokeless tobacco: Never   Tobacco comments:    1 cigar per week  Vaping Use   Vaping status: Never Used  Substance and Sexual Activity   Alcohol use: Yes    Alcohol/week: 2.0 standard drinks of alcohol    Types: 2 Cans of beer per week    Comment: occasionally; once every 2 weeks   Drug use: No   Sexual activity: Not on file  Other Topics Concern   Not on file  Social History Narrative   Not on file   Social Drivers of Health   Financial Resource Strain: Low Risk  (04/01/2022)   Overall Financial Resource Strain (CARDIA)  Difficulty of Paying Living Expenses: Not hard at all  Food Insecurity: No Food Insecurity (04/01/2022)   Hunger Vital Sign    Worried About Running Out of Food in the Last Year: Never true    Ran Out of Food in the Last Year: Never true  Transportation Needs: No Transportation Needs (04/01/2022)   PRAPARE - Administrator, Civil Service (Medical): No    Lack of Transportation (Non-Medical): No  Physical Activity: Insufficiently Active (04/01/2022)   Exercise Vital Sign    Days of Exercise per Week: 3 days    Minutes of Exercise per Session: 30 min  Stress: No Stress Concern Present (04/01/2022)   [employer] of Occupational Health - Occupational Stress Questionnaire    Feeling of Stress : Not at all  Social Connections: Unknown (02/19/2022)   Received from [facility]   Social Network    Social Network: Not on file     Family History: The patient's family history includes Cancer in his mother; High Cholesterol in his father; High blood pressure in his father; Obesity in his mother. There is no history of Colon cancer or Depression.  ROS:   Please see the history of present illness.     All other systems reviewed and are negative.  EKGs/Labs/Other Studies Reviewed:    The following studies were reviewed today:   EKG:    09/15/2022: Normal sinus rhythm, rate 74, nonspecific T wave flattening, QTc 481 01/18/2024: Sinus rhythm with occasional PVCs, rate 91, QTc 457   Recent Labs: 07/19/2023: ALT 32; BUN 15; Creatinine, Ser 1.02; Magnesium 2.1; Potassium 4.2; Sodium 143  Recent Lipid Panel    Component Value Date/Time   CHOL 175 12/23/2022 0910   TRIG 104 12/23/2022 0910   HDL 50 12/23/2022 0910   CHOLHDL 3.2 06/19/2020 0120   VLDL 9 06/19/2020 0120   LDLCALC 106 (H) 12/23/2022 0910    Physical Exam:    VS:  BP 112/78 (BP Location: Right Arm, Patient Position: Sitting, Cuff Size: Normal)   Pulse 90   Ht 5\' 8"  (1.727 m)   Wt 236 lb 9.6 oz (107.3 kg)   SpO2 94%   BMI 35.97 kg/m     Wt Readings from Last 3 Encounters:  01/18/24 236 lb 9.6 oz (107.3 kg)  01/04/24 237 lb (107.5 kg)  12/28/23 234 lb (106.1 kg)     GEN: Well nourished, well developed in no acute distress HEENT: Normal NECK: No JVD appreciated but difficult to assess given habitus; No carotid bruits CARDIAC: RRR, 2 out of 6 systolic murmur RESPIRATORY:  Clear to auscultation without rales, wheezing or rhonchi  ABDOMEN: Soft, non-tender, non-distended MUSCULOSKELETAL:  No edema; No deformity  SKIN: Warm and dry NEUROLOGIC:  Alert and oriented x 3 PSYCHIATRIC:  Normal affect   ASSESSMENT:    1. Heart failure with recovered ejection fraction (HFrecEF) (HCC)   2. Aortic valve stenosis, etiology of cardiac valve disease unspecified   3. Persistent atrial fibrillation (HCC)   4. Essential hypertension   5. Hypercholesterolemia       PLAN:    Heart failure with recovered EF: admission 11/21 for a/c systolic heart failure>>Cardiogenic shock requiring milrinone, in setting of Afib w/ RVR.  EF 35-40% in 9/21 -> reduced down to 20% on repeat study 11/21.  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 Cath with normal cors. Echo 12/05/20  EF 50-55% RV normal.  Status post A-fib ablation 06/23/2022 - Continue lasix 80 mg po daily.  Appears euvolemic.  Will  check CMP/magnesium - Continue Entresto 49/51 BID - Continue Farxiga 10 mg daily   - Continue Toprol XL 25 mg daily.   - Update echocardiogram   Persistent Atrial Fibrillation s/p DCCV 9/21. Had ERAF>>underwent repeat DCCV 11/21 back to NSR.  Zio 12/21 with no AF -Status post ablation 06/23/2022 with Dr. Elberta Fortis.  Appears to be maintaining sinus rhythm.  Previously was on amiodarone, discontinued after ablation - Continue Toprol XL 25 mg daily.  - Continue eliquis 5 mg bid.  - Started CPAP for OSA  Aortic stenosis: Moderate on echocardiogram 07/2023.  Will repeat echocardiogram in 1 year to monitor  HTN: Continue Entresto, metoprolol as above   OSA:  suspect untreated OSA led to A. fib which led to heart failure.  He reports compliance with CPAP, though having issues with his device.  States that he wakes up with headaches and he thinks adjustments need to be made to his device settings.  Recommend following up with Dr. Mayford Knife  Tobacco use: Quit smoking cigarettes.  Currently smoking 1 cigar/week.  Encouraged cessation  Morbid obesity: Body mass index is 35.97 kg/m. Follows with healthy [facility] Well[facility].    HLD: On atorvastatin 80 mg daily and Zetia 10 mg daily, check lipid panel  Transaminitis: Follows with GI, has fatty liver disease.   Check CMET   RTC in 6 months    Medication Adjustments/Labs and Tests Ordered: Current medicines are reviewed at length with the patient today.  Concerns regarding medicines are outlined above.  Orders Placed This Encounter  Procedures   Lipid panel   Magnesium   Comprehensive Metabolic Panel (CMET)   EKG 12-Lead     No orders of the defined types were placed in this encounter.     Patient Instructions  Medication Instructions:  Continue current medications *If you need a refill on your cardiac medications before your next appointment, please call your pharmacy*  Lab Work: Mg, lipid,cmet today If you have labs (blood work)  drawn today and your tests are completely normal, you will receive your results only by: MyChart Message (if you have MyChart) OR A paper copy in the mail If you have any lab test that is abnormal or we need to change your treatment, we will call you to review the results.  Testing/Procedures: noe  Follow-Up: At [facility], you and your health needs are our priority.  As part of our continuing mission to provide you with exceptional heart care, our providers are all part of one team.  This team includes your primary Cardiologist (physician) and Advanced Practice Providers or APPs (Physician Assistants and Nurse Practitioners) who all work together to provide you with the care you need, when you need it.  Your next appointment:   6 month(s)  Provider:   Little Ishikawa, MD     We recommend signing up for the patient portal called "MyChart".  Sign up information is provided on this After Visit Summary.  MyChart is used to connect with patients for Virtual Visits (Telemedicine).  Patients are able to view lab/test results, encounter notes, upcoming appointments, etc.  Non-urgent messages can be sent to your provider as well.   To learn more about what you can do with MyChart, go to [email or web address].   Other Instructions Referral to sleep medicine to see Dr. Mayford Knife      1st Floor: - Lobby - Registration  - Pharmacy  - Lab - Cafe  2nd Floor: - PV  Lab - Diagnostic Testing (echo, CT, nuclear med)  3rd Floor: - Vacant  4th Floor: - TCTS (cardiothoracic surgery) - AFib Clinic - Structural Heart Clinic - Vascular Surgery  - Vascular Ultrasound  5th Floor: - HeartCare Cardiology (general and EP) - Clinical Pharmacy for coumadin, hypertension, lipid, weight-loss medications, and med management appointments    Valet parking services will be available as well.      Signed, Little Ishikawa, MD  01/18/2024 9:12 AM    [facility]  HeartCare

## 2024-02-05 NOTE — Progress Notes (Unsigned)
 SUBJECTIVE: Discussed the use of AI scribe software for clinical note transcription with the patient, who gave verbal consent to proceed.  Chief Complaint: Obesity  Interim History: He is down 3 lbs since last visit.  Down 22 lbs overall TBW loss of 8.7%  Christopher Wilkins is here to discuss his progress with his obesity treatment plan. He is on the Category 3 Plan and states he is following his eating plan approximately 50 % of the time. He states he is exercising walking 60+ minutes 3 times per week.  Christopher Puck. is a 68 year old male with obesity who presents for follow-up of his obesity treatment plan.  He has lost three more pounds since his last visit. He is currently on a 0.25 mg dose of Ozempic  and follows a category three diet plan, which he tries to adhere to as closely as possible. His appetite is significantly reduced due to Ozempic , but he makes an effort to consume some food, particularly protein. He typically eats one scrambled egg instead of the recommended three. He engages in physical activity, walking for about 60 minutes three times a week. His clothes are fitting better, and he may need to adjust his belt, indicating weight loss.  He has type 2 diabetes and is on metformin XR 500 mg daily. He monitors his blood sugar every other day, with readings between 90 and 97 mg/dL. No episodes of hypoglycemia. He is concerned about metformin affecting kidney function, referencing his niece's experience, but his current kidney function is normal/stable.. He has chronic heart failure, which is well compensated.    Saw Dr. Marshall Skeeter for left ankle pain- consistent with Left ankle OA.    OBJECTIVE: Visit Diagnoses: Problem List Items Addressed This Visit     CHF (congestive heart failure) (HCC)   Type 2 diabetes mellitus with other specified complication (HCC) - Primary   Obesity- Start BMI 38.47   BMI 35.0-35.9,adult   Obesity Obesity management is ongoing with a current weight  loss of three pounds. He is on a low dose of Ozempic  (0.25 mg) which is effectively reducing appetite. He is following a Category 3 diet plan and is engaging in regular physical activity, walking approximately 60 minutes three times a week. The current dose of Ozempic  is maintained to prevent excessive appetite suppression, as increasing the dose may lead to inadequate nutritional intake. - Continue Ozempic  0.25 mg - Encourage adherence to Category 3 diet plan - Encourage regular physical activity, aiming for at least 60 minutes of walking three times a week  Type 2 Diabetes Mellitus Type 2 diabetes is well-controlled with blood sugar levels between 90 and 97 mg/dL. He is on metformin XR 500 mg daily, Ozempic  0.25 mg weekly and Farxiga  10 mg daily for HR as primary indication and reports no episodes of hypoglycemia. Kidney function is normal, and there is no concern for metformin-related nephrotoxicity currently. Reassured regarding metformin safety given normal kidney function. Lab Results  Component Value Date   HGBA1C 6.0 (H) 12/23/2022   HGBA1C 6.5 (H) 12/31/2021   HGBA1C 5.9 (H) 08/17/2020   Lab Results  Component Value Date   MICROALBUR 0.84 04/03/2010   LDLCALC 86 01/18/2024   CREATININE 1.18 01/18/2024    - Continue metformin XR 500 mg daily and Ozempic  0.25 mg weekly and Farxiga  10 mg daily.  Would not increase Ozempic  at this point as doing well with gradual weight loss and maintaining muscle mass. BMI improved 35.13 and overall adipose improved to  33.5% from previous 36.5% - Monitor blood sugar levels regularly He is working  on nutrition plan to decrease simple carbohydrates, increase lean proteins and exercise to promote weight loss and improve glycemic control .   Chronic Heart Failure Chronic heart failure is well-compensated with current medications including Farxiga , Lasix , Entresto , and metoprolol , zetia, Lipitor. He reports minimal ankle swelling. A follow-up  echocardiogram is scheduled to assess cardiac function. Current medications are deemed effective by his cardiologist. - Continue current heart failure medications: Farxiga , Lasix , Entresto , metoprolol  - Complete scheduled echocardiogram on Mar 07, 2024 Cardiology visit with Dr. Alda Amas  Heart failure with recovered EF: admission 11/21 for a/c systolic heart failure>>Cardiogenic shock requiring milrinone , in setting of Afib w/ RVR.  EF 35-40% in 9/21 -> reduced down to 20% on repeat study 11/21.  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 Cath with normal cors. Echo 12/05/20  EF 50-55% RV normal.  Status post A-fib ablation 06/23/2022 - Continue lasix  80 mg po daily.  Appears euvolemic.  Will check CMP/magnesium  - Continue Entresto  49/51 BID - Continue Farxiga  10 mg daily   - Continue Toprol  XL 25 mg daily.   - Update echocardiogram  Persistent Atrial Fibrillation s/p DCCV 9/21. Had ERAF>>underwent repeat DCCV 11/21 back to NSR.  Zio 12/21 with no AF -Status post ablation 06/23/2022 with Dr. Lawana Pray.  Appears to be maintaining sinus rhythm.  Previously was on amiodarone , discontinued after ablation - Continue Toprol  XL 25 mg daily.  - Continue eliquis  5 mg bid.  - Started CPAP for OSA  Aortic stenosis: Moderate on echocardiogram 07/2023.  Will repeat echocardiogram in 1 year to monitor  HTN: Continue Entresto , metoprolol  as above  OSA:  suspect untreated OSA led to A. fib which led to heart failure.  He reports compliance with CPAP, though having issues with his device.  States that he wakes up with headaches and he thinks adjustments need to be made to his device settings.  Recommend following up with Dr. Micael Adas  Tobacco use: Quit smoking cigarettes.  Currently smoking 1 cigar/week.  Encouraged cessation  Morbid obesity: Body mass index is 35.97 kg/m. Follows with healthy Weight and Wellness.    HLD: On atorvastatin  80 mg daily and Zetia 10 mg daily, check lipid panel  Transaminitis: Follows with GI, has fatty  liver disease.   Check CMET       Vitals Temp: (!) 97.5 F (36.4 C) BP: 122/85 Pulse Rate: 77 SpO2: 96 %   Anthropometric Measurements Height: 5\' 8"  (1.727 m) Weight: 231 lb (104.8 kg) BMI (Calculated): 35.13 Weight at Last Visit: 234 lb Weight Lost Since Last Visit: 3 lb Weight Gained Since Last Visit: 0 Starting Weight: 253 lb Total Weight Loss (lbs): 22 lb (9.979 kg) Peak Weight: 275 lb   Body Composition  Body Fat %: 33.5 % Fat Mass (lbs): 77.6 lbs Muscle Mass (lbs): 146.4 lbs Total Body Water (lbs): 105.6 lbs Visceral Fat Rating : 21   Other Clinical Data Fasting: yes Labs: no Today's Visit #: 24 Starting Date: 12/31/21     ASSESSMENT AND PLAN:  Diet: [place]on is currently in the action stage of change. As such, his goal is to continue with weight loss efforts. He has agreed to Category 3 Plan.  Exercise: Terrik has been instructed to try a geriatric exercise plan and that some exercise is better than none for weight loss and overall health benefits.   Behavior Modification:  We discussed the following Behavioral Modification Strategies today: increasing lean protein intake, decreasing  simple carbohydrates, increasing vegetables, increase H2O intake, increase high fiber foods, meal planning and cooking strategies, avoiding temptations, and planning for success. We discussed various medication options to help Offie with his weight loss efforts and we both agreed to continue low dose Ozempic  0.25 mg weekly for Type 2 diabetes and continue all usual medications, continue to work on nutritional and behavioral strategies to promote weight loss.  .  Return in about 6 weeks (around 03/19/2024).Aaron Aas He was informed of the importance of frequent follow up visits to maximize his success with intensive lifestyle modifications for his multiple health conditions.  Attestation Statements:   Reviewed by clinician on day of visit: allergies, medications, problem list, medical  history, surgical history, family history, social history, and previous encounter notes.   Time spent on visit including pre-visit chart review and post-visit care and charting was 28 minutes.    Issis Lindseth, PA-C

## 2024-02-06 ENCOUNTER — Encounter (INDEPENDENT_AMBULATORY_CARE_PROVIDER_SITE_OTHER): Payer: Self-pay | Admitting: Physician Assistant

## 2024-02-06 ENCOUNTER — Ambulatory Visit (INDEPENDENT_AMBULATORY_CARE_PROVIDER_SITE_OTHER): Admitting: Physician Assistant

## 2024-02-06 VITALS — BP 122/85 | HR 77 | Temp 97.5°F | Ht 68.0 in | Wt 231.0 lb

## 2024-02-06 DIAGNOSIS — G4733 Obstructive sleep apnea (adult) (pediatric): Secondary | ICD-10-CM

## 2024-02-06 DIAGNOSIS — E559 Vitamin D deficiency, unspecified: Secondary | ICD-10-CM

## 2024-02-06 DIAGNOSIS — Z7984 Long term (current) use of oral hypoglycemic drugs: Secondary | ICD-10-CM

## 2024-02-06 DIAGNOSIS — G8929 Other chronic pain: Secondary | ICD-10-CM

## 2024-02-06 DIAGNOSIS — I110 Hypertensive heart disease with heart failure: Secondary | ICD-10-CM

## 2024-02-06 DIAGNOSIS — E785 Hyperlipidemia, unspecified: Secondary | ICD-10-CM

## 2024-02-06 DIAGNOSIS — I350 Nonrheumatic aortic (valve) stenosis: Secondary | ICD-10-CM

## 2024-02-06 DIAGNOSIS — I4819 Other persistent atrial fibrillation: Secondary | ICD-10-CM | POA: Diagnosis not present

## 2024-02-06 DIAGNOSIS — R7401 Elevation of levels of liver transaminase levels: Secondary | ICD-10-CM

## 2024-02-06 DIAGNOSIS — I509 Heart failure, unspecified: Secondary | ICD-10-CM

## 2024-02-06 DIAGNOSIS — Z6835 Body mass index (BMI) 35.0-35.9, adult: Secondary | ICD-10-CM

## 2024-02-06 DIAGNOSIS — E1169 Type 2 diabetes mellitus with other specified complication: Secondary | ICD-10-CM | POA: Diagnosis not present

## 2024-02-06 DIAGNOSIS — E669 Obesity, unspecified: Secondary | ICD-10-CM

## 2024-02-06 DIAGNOSIS — Z7985 Long-term (current) use of injectable non-insulin antidiabetic drugs: Secondary | ICD-10-CM

## 2024-02-15 ENCOUNTER — Ambulatory Visit: Admitting: Sports Medicine

## 2024-03-02 ENCOUNTER — Ambulatory Visit
Admission: RE | Admit: 2024-03-02 | Discharge: 2024-03-02 | Disposition: A | Discharge status: Home / Self Care | Source: Ambulatory Visit | Attending: Registered Nurse | Admitting: Registered Nurse

## 2024-03-02 ENCOUNTER — Other Ambulatory Visit: Payer: Self-pay | Admitting: Registered Nurse

## 2024-03-02 DIAGNOSIS — R300 Dysuria: Secondary | ICD-10-CM

## 2024-03-06 ENCOUNTER — Encounter: Payer: 59 | Attending: Psychology | Admitting: Psychology

## 2024-03-06 DIAGNOSIS — F4312 Post-traumatic stress disorder, chronic: Secondary | ICD-10-CM | POA: Diagnosis not present

## 2024-03-06 DIAGNOSIS — E1169 Type 2 diabetes mellitus with other specified complication: Secondary | ICD-10-CM

## 2024-03-06 DIAGNOSIS — E669 Obesity, unspecified: Secondary | ICD-10-CM

## 2024-03-06 DIAGNOSIS — R413 Other amnesia: Secondary | ICD-10-CM

## 2024-03-06 DIAGNOSIS — Z794 Long term (current) use of insulin: Secondary | ICD-10-CM

## 2024-03-06 DIAGNOSIS — I4891 Unspecified atrial fibrillation: Secondary | ICD-10-CM

## 2024-03-06 DIAGNOSIS — F5102 Adjustment insomnia: Secondary | ICD-10-CM | POA: Diagnosis not present
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
## 2024-03-13 ENCOUNTER — Encounter: Payer: Self-pay | Admitting: Psychology

## 2024-03-13 ENCOUNTER — Encounter: Payer: 59 | Attending: Psychology | Admitting: Psychology

## 2024-03-13 DIAGNOSIS — Z794 Long term (current) use of insulin: Secondary | ICD-10-CM

## 2024-03-13 DIAGNOSIS — R413 Other amnesia: Secondary | ICD-10-CM | POA: Diagnosis not present

## 2024-03-13 DIAGNOSIS — E1169 Type 2 diabetes mellitus with other specified complication: Secondary | ICD-10-CM | POA: Insufficient documentation

## 2024-03-13 DIAGNOSIS — E669 Obesity, unspecified: Secondary | ICD-10-CM | POA: Insufficient documentation

## 2024-03-13 DIAGNOSIS — F4312 Post-traumatic stress disorder, chronic: Secondary | ICD-10-CM | POA: Insufficient documentation
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
## 2024-03-17 NOTE — Progress Notes (Unsigned)
 SUBJECTIVE: Discussed the use of AI scribe software for clinical note transcription with the patient, who gave verbal consent to proceed.  Chief Complaint: Obesity  Interim History: He is down 4 lbs since his last visit.  Down 26 lbs overall TBW loss 10.3% Yoshua is here to discuss his progress with his obesity treatment plan. He is on the Category 3 Plan and states he is following his eating plan approximately 95 % of the time. He states he is exercising 60 minutes 4 times per week.  Recent visit with Neuropsychologist indicated chronic PTSD/post concussive attention issues.   Pio Eatherly. is a 68 year old male who presents for follow-up of his obesity treatment plan.  He is currently on Ozempic  0.25 mg once weekly for diabetic management, metformin 500 mg once daily, Farxiga  10 mg daily for chronic heart failure, Entresto  49/51 mg twice daily, atorvastatin  80 mg daily for hyperlipidemia, Zetia 10 mg once daily, Lasix  80 mg daily for heart failure, and is chronically anticoagulated on Eliquis .  He has been experiencing confusion regarding medication management in preparation for an upcoming colonoscopy scheduled for June 12th. He has been taking only a few of his medications due to uncertainty about which ones to hold.We discussed holding his Eliquis  and he has also been holding his Ozempic , which seems appropriate as he has also been limiting his food intake as he thought this was required as well.  He had his instructions for pre-colonoscopy with him and we reviewed today.   He reports a decrease in weight, attributed to increased walking and reduced food intake in preparation for the colonoscopy. He has been eating less for about a week, primarily consuming [place], boiled eggs, and limited bread. He typically eats breakfast consisting of eggs and grits, a [place] sandwich for lunch, and another sandwich with broccoli for dinner.  He has been off Ozempic  for over a week due to the  upcoming procedure and has noticed an increase in the supply of Ozempic  being sent to him and he is going to check with his pharmacy concerning the prescriptions.  He is planning a trip to [place], [place] , with his grandchildren and anticipates being active during the visit. He engages in regular walking, often with his sisters, and avoids sweets due to stomach discomfort.  OBJECTIVE: Visit Diagnoses: Problem List Items Addressed This Visit     CHF (congestive heart failure) (HCC)   Type 2 diabetes mellitus with other specified complication (HCC) - Primary   Vitamin D  deficiency   Other hyperlipidemia   Obesity- Start BMI 38.47   Other Visit Diagnoses       OSA on CPAP         Chronic pain of left ankle         BMI 34.0-34.9,adult Current BMI 34.5         Colonoscopy Preparation He is preparing for a colonoscopy on June 12. Detailed instructions on medication management and dietary restrictions were provided to ensure effective bowel preparation. Emphasized holding Eliquis  and maintaining clear liquids to prevent dehydration. - Start clear liquids two days before the procedure - Take three Dulcolax tablets two days before the procedure - Consume only clear liquids the day before the procedure - Avoid red, pink, purple, and blue foods, jellos, Gatorades, etc - Hold Eliquis  prior to the procedure as instructed and identified which pill in his supply is Eliquis .  - Take Entresto  and metoprolol  on the morning of the procedure as instructed by pharmacist.  Obesity He is on a low dose of Ozempic  (0.25 mg weekly) for weight management, paused due to the upcoming colonoscopy. He has lost 26 pounds, with reduced adipose tissue and maintained muscle mass. Discussed adjusting Ozempic  dosage post-colonoscopy, considering appetite suppression and weight loss. Emphasized resuming a balanced diet post-procedure. - Resume Ozempic  post-colonoscopy if appropriate - Consider adjusting  Ozempic  dosage to every other week based on appetite and weight management - Encourage resumption of balanced diet post-procedure  Type 2 Diabetes Mellitus He is on Ozempic  and metformin for diabetes management. Monitoring A1c and weight to assess regimen effectiveness. Discussed potential Ozempic  adjustment based on weight and appetite changes. - Monitor A1c levels - Continue metformin 500 mg once daily  Chronic Heart Failure His chronic heart failure is well compensated. He is on Farxiga , Entresto , and Lasix . Emphasized continuing these medications, with adjustments around the colonoscopy. - Continue Farxiga  10 mg daily - Continue Entresto  49/51 mg twice daily - Continue Lasix  80 mg daily - Hold Farxiga  and Lasix  on the day of the colonoscopy  Hyperlipidemia He is on atorvastatin  and Zetia for hyperlipidemia management. The current regimen is effective. - Continue atorvastatin  80 mg daily - Continue Zetia 10 mg daily  Anticoagulation Management He is chronically anticoagulated on Eliquis . Advised holding Eliquis  for the colonoscopy to reduce bleeding risk. - Hold Eliquis  prior to colonoscopy  General Health Maintenance He engages in regular physical activity, including walking, and is mindful of his diet. Provided dietary advice to maintain weight and manage diabetes, emphasizing protein intake and avoiding sweets and excessive carbohydrates. - Encourage regular walking and physical activity - Advise on dietary choices, including protein intake and avoiding sweets and excessive carbohydrates  Follow-up He is scheduled for a follow-up appointment to monitor health conditions and adjust treatment plans as necessary. - Schedule follow-up appointment on August 6 at 8:30 AM  Vitals Temp: 97.8 F (36.6 C) BP: 122/82 Pulse Rate: 80 SpO2: 97 %   Anthropometric Measurements Height: 5\' 8"  (1.727 m) Weight: 227 lb (103 kg) BMI (Calculated): 34.52 Weight at Last Visit:  231lb Weight Lost Since Last Visit: 4lb Weight Gained Since Last Visit: 0 Starting Weight: 253lb Total Weight Loss (lbs): 26 lb (11.8 kg) Peak Weight: 275lb   Body Composition  Body Fat %: 32.8 % Fat Mass (lbs): 74.8 lbs Muscle Mass (lbs): 145.4 lbs Total Body Water (lbs): 106.6 lbs Visceral Fat Rating : 20   Other Clinical Data Fasting: yes Labs: no Today's Visit #: 25 Starting Date: 12/31/21     ASSESSMENT AND PLAN:  Diet: Dwaine is currently in the action stage of change. As such, his goal is to continue with weight loss efforts. He has agreed to Category 3 Plan.  Exercise: Bowie has been instructed to work up to a goal of 150 minutes of combined cardio and strengthening exercise per week and to continue exercising as is for weight loss and overall health benefits.   Behavior Modification:  We discussed the following Behavioral Modification Strategies today: increasing lean protein intake, decreasing simple carbohydrates, increasing vegetables, increase H2O intake, increase high fiber foods, no skipping meals, travel eating strategies, avoiding temptations, and planning for success. We discussed various medication options to help Cowan with his weight loss efforts and we both agreed to continue current treatment plan, continue to work on nutritional and behavioral strategies to promote weight loss.  .  Return in about 8 weeks (around 05/14/2024).Aaron Aas He was informed of the importance of frequent follow up visits to maximize  his success with intensive lifestyle modifications for his multiple health conditions.  Attestation Statements:   Reviewed by clinician on day of visit: allergies, medications, problem list, medical history, surgical history, family history, social history, and previous encounter notes.   Time spent on visit including pre-visit chart review and post-visit care and charting was 32 minutes.    Mylene Bow, PA-C

## 2024-03-19 ENCOUNTER — Ambulatory Visit (INDEPENDENT_AMBULATORY_CARE_PROVIDER_SITE_OTHER): Admitting: Physician Assistant

## 2024-03-19 ENCOUNTER — Encounter (INDEPENDENT_AMBULATORY_CARE_PROVIDER_SITE_OTHER): Payer: Self-pay | Admitting: Physician Assistant

## 2024-03-19 VITALS — BP 122/82 | HR 80 | Temp 97.8°F | Ht 68.0 in | Wt 227.0 lb

## 2024-03-19 DIAGNOSIS — Z7984 Long term (current) use of oral hypoglycemic drugs: Secondary | ICD-10-CM

## 2024-03-19 DIAGNOSIS — Z7985 Long-term (current) use of injectable non-insulin antidiabetic drugs: Secondary | ICD-10-CM

## 2024-03-19 DIAGNOSIS — Z6834 Body mass index (BMI) 34.0-34.9, adult: Secondary | ICD-10-CM

## 2024-03-19 DIAGNOSIS — G4733 Obstructive sleep apnea (adult) (pediatric): Secondary | ICD-10-CM

## 2024-03-19 DIAGNOSIS — I509 Heart failure, unspecified: Secondary | ICD-10-CM

## 2024-03-19 DIAGNOSIS — E7849 Other hyperlipidemia: Secondary | ICD-10-CM | POA: Diagnosis not present

## 2024-03-19 DIAGNOSIS — E1169 Type 2 diabetes mellitus with other specified complication: Secondary | ICD-10-CM | POA: Diagnosis not present

## 2024-03-19 DIAGNOSIS — M25572 Pain in left ankle and joints of left foot: Secondary | ICD-10-CM

## 2024-03-19 DIAGNOSIS — G8929 Other chronic pain: Secondary | ICD-10-CM

## 2024-03-19 DIAGNOSIS — E669 Obesity, unspecified: Secondary | ICD-10-CM

## 2024-03-19 DIAGNOSIS — E559 Vitamin D deficiency, unspecified: Secondary | ICD-10-CM | POA: Diagnosis not present

## 2024-04-09 ENCOUNTER — Ambulatory Visit (INDEPENDENT_AMBULATORY_CARE_PROVIDER_SITE_OTHER): Admitting: Podiatry

## 2024-04-09 ENCOUNTER — Encounter: Payer: Self-pay | Admitting: Podiatry

## 2024-04-09 DIAGNOSIS — M79675 Pain in left toe(s): Secondary | ICD-10-CM | POA: Diagnosis not present

## 2024-04-09 DIAGNOSIS — B351 Tinea unguium: Secondary | ICD-10-CM | POA: Diagnosis not present

## 2024-04-09 DIAGNOSIS — D689 Coagulation defect, unspecified: Secondary | ICD-10-CM | POA: Diagnosis not present

## 2024-04-09 DIAGNOSIS — M79674 Pain in right toe(s): Secondary | ICD-10-CM | POA: Diagnosis not present

## 2024-04-09 DIAGNOSIS — E1169 Type 2 diabetes mellitus with other specified complication: Secondary | ICD-10-CM | POA: Diagnosis not present

## 2024-04-09 NOTE — Progress Notes (Signed)
  Subjective:  Patient ID: Christopher Joshua Raddle., male    DOB: 24-Sep-1956,   MRN: [id number]  Chief Complaint  Patient presents with   Diabetes    [facility] toe nail trim NIDDM 6.0.    68 y.o. male presents for thickened elongated and painful toenails that he cannot trim himself. Has a history of congestive heart failure and is currently on Eliquis . Relates burning and tingling in their feet. Patient is diabetic and last A1c was  Lab Results  Component Value Date   HGBA1C 6.0 (H) 12/23/2022   .   PCP:  Leontine Cramp, NP    Denies any other pedal complaints. Denies n/v/f/c.   Past Medical History:  Diagnosis Date   Atrial fibrillation (HCC)    CHF (congestive heart failure) (HCC)    Diabetes (HCC)    Dyspnea    Elevated LFTs    Fatty liver    Hypertension    Insomnia    Prediabetes    Sleep apnea    SOB (shortness of breath)    Tubular adenoma of colon    Vitamin D  deficiency     Objective:  Physical Exam: Vascular: DP/PT pulses 2/4 bilateral. CFT <3 seconds. Normal hair growth on digits. No edema.  Skin. No lacerations or abrasions bilateral feet. Nails 1-5 are thickened discolored and elongated with subungual debris.  Musculoskeletal: MMT 5/5 bilateral lower extremities in DF, PF, Inversion and Eversion. Deceased ROM in DF of ankle joint.  Neurological: Sensation intact to light touch. Protective sensation intact.   Assessment:   1. Pain due to onychomycosis of toenails of both feet   2. Type 2 diabetes mellitus with other specified complication, unspecified whether long term insulin  use (HCC)   3. Coagulation disorder (HCC)         Plan:  Patient was evaluated and treated and all questions answered. -Discussed and educated patient on diabetic  foot care, especially with  regards to the vascular, neurological and musculoskeletal systems.  -Discussed supportive shoes at all times and checking feet regularly.  -Mechanically debrided all nails 1-5 bilateral using sterile nail  nipper and filed with dremel without incident  -Answered all patient questions -Patient to return  in 3 months for at risk foot care -Patient advised to call the office if any problems or questions arise in the meantime.   Asberry Failing, DPM

## 2024-05-15 NOTE — Progress Notes (Unsigned)
 SUBJECTIVE: Discussed the use of AI scribe software for clinical note transcription with the patient, who gave verbal consent to proceed.  Chief Complaint: Obesity  Interim History: He is up 2 lbs since his last visit Down 24 lbs overall TBW Loss of 9.5% Christopher Wilkins is here to discuss his progress with his obesity treatment plan. He is on the Category 2 Plan and states he is following his eating plan approximately 50 % of the time. He states he is exercising walking 60-120 minutes 7 times per week.  Feels well but having difficulty with phimosis ongoing for about 1.5 months. Saw PCP at [facility] and told no infection but reports he is unable to retract foreskin and is painful/ frequent urination of small amounts. Concerns for infection/bladder rupture or ischemia to penis if not addressed urgently were discussed with the patient   From HWW standpoint:  Christopher Wilkins. is a 68 year old male with obesity, type 2 diabetes, and congestive heart failure who presents for follow-up on his obesity treatment plan.  He reports that the skin on his penis has been shrinking, making it difficult to retract the foreskin for cleaning, which began about a month and a half ago. Initially, it was painful, leading him to temporarily discontinue Ozempic . He resumed Ozempic  two weeks ago. Currently, there is no severe pain, but he experiences occasional dysuria. He has been evaluated for infection, with negative results at his PCP office per his report. Records not available for review at this time.   He is on multiple medications including Eliquis  5 mg twice daily, Lipitor 80 mg daily, Farxiga  10 mg daily, Entresto  49/51 mg one tablet twice daily, Zetia 10 mg daily, Lasix  80 mg daily, metformin 500 mg daily, metoprolol  25 mg daily, Ozempic  0.25 mg weekly, potassium chloride  20 mEq daily, and Wixela inhaler one puff twice daily. He also uses an albuterol  inhaler as needed.  He maintains a diet rich in vegetables and  fruits, consuming strawberries, plums, peaches, and bananas in moderation, and limits his intake of grapes to five per day. He continues to engage in regular walking exercises. Feels he is meeting protein needs most of the time. Feels he has adequate water intake.   No issues with breathing despite recent bad weather.  Called [facility] and made referral to have patient seen ASAP.   OBJECTIVE: Visit Diagnoses: Problem List Items Addressed This Visit     CHF (congestive heart failure) (HCC)   Relevant Medications   albuterol  (VENTOLIN  HFA) 108 (90 Base) MCG/ACT inhaler   Type 2 diabetes mellitus with other specified complication (HCC)   Vitamin D  deficiency   Other hyperlipidemia   Obesity- Start BMI 38.47   Other Visit Diagnoses       Phimosis of penis    -  Primary     OSA on CPAP         Phimosis Phimosis has persisted, with foreskin non-retractability causing hygiene and urination difficulties. No infection is present per evaluation at PCP [facility]  Risk of urinary obstruction/bladder rupture/infection or necrosis of penis are possible complications if left untreated. Made urgent referral to [facility] to avoid complications.  Patient currently reports is able to urinate, but only small amounts. Reports pain at times over penis.  - Scheduled urology consultation for further assessment and management with Sueanne RN and will fax over today's notes. - Advised to go to emergency room or urgent care visit if urinary retention occurs/ increased pain  or other concerns.   Obesity Weight has increased by 1.2 pounds with stable muscle mass. Current management with Ozempic  0.25 mg weekly aids in glycemic control and promoting healthy weight loss. Has labs done at [facility].  -He is working  on nutrition plan to decrease simple carbohydrates, increase lean proteins and exercise to promote weight loss and improve glycemic control . - Continue all usual  medications.  - Continue Ozempic  0.25 mg weekly as prescribed by PCP. - Encourage healthy diet and regular physical activity.  Type 2 diabetes mellitus Blood glucose levels are well-controlled with current medications, including low-dose Ozempic . Current complications are present. - Continue current diabetes medications: Ozempic , Metformin, and Farxiga . 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    Hx CHF/COPD Using inhaler- Albuterol  prn , reports doing well from respiratory standpoint today. No increased work of breathing and feels well other than issues with phimosis.  Plan Refill Albuterol  Monitor with PCP.  Vitals Temp: 97.7 F (36.5 C) BP: 106/70 Pulse Rate: 86 SpO2: 96 %   Anthropometric Measurements Height: 5' 8 (1.727 m) Weight: 229 lb (103.9 kg) BMI (Calculated): 34.83 Weight at Last Visit: 227 lb Weight Lost Since Last Visit: 0 Weight Gained Since Last Visit: 2 lb Starting Weight: 253 lb Total Weight Loss (lbs): 24 lb (10.9 kg) Peak Weight: 275 lb   Body Composition  Body Fat %: 33.2 % Fat Mass (lbs): 76 lbs Muscle Mass (lbs): 145.6 lbs Total Body Water (lbs): 105.8 lbs Visceral Fat Rating : 21   Other Clinical Data Fasting: yes Labs: no Today's Visit #: 26 Starting Date: 12/31/21     ASSESSMENT AND PLAN:  Diet: Christopher Wilkins is currently in the action stage of change. As such, his goal is to continue with weight loss efforts. He has agreed to Category 2 Plan.  Exercise: Christopher Wilkins has been instructed to continue exercising as is for weight loss and overall health benefits.   Behavior Modification:  We discussed the following Behavioral Modification Strategies today: increasing lean protein intake, decreasing simple carbohydrates, increasing vegetables, increase H2O intake, increase high fiber foods, no skipping meals, meal planning and  cooking strategies, avoiding temptations, and planning for success. We discussed various medication options to help Christopher Wilkins with his weight loss efforts and we both agreed to continue current treatment plan.  Return in about 6 weeks (around 06/27/2024).[name] He was informed of the importance of frequent follow up visits to maximize his success with intensive lifestyle modifications for his multiple health conditions.  Attestation Statements:   Reviewed by clinician on day of visit: allergies, medications, problem list, medical history, surgical history, family history, social history, and previous encounter notes.   Time spent on visit including pre-visit chart review and post-visit care and charting was 45 minutes.    Jaydee Conran, PA-C

## 2024-05-16 ENCOUNTER — Ambulatory Visit (INDEPENDENT_AMBULATORY_CARE_PROVIDER_SITE_OTHER): Admitting: Physician Assistant

## 2024-05-16 ENCOUNTER — Encounter (INDEPENDENT_AMBULATORY_CARE_PROVIDER_SITE_OTHER): Payer: Self-pay | Admitting: Physician Assistant

## 2024-05-16 VITALS — BP 106/70 | HR 86 | Temp 97.7°F | Ht 68.0 in | Wt 229.0 lb

## 2024-05-16 DIAGNOSIS — N471 Phimosis: Secondary | ICD-10-CM | POA: Diagnosis not present

## 2024-05-16 DIAGNOSIS — E559 Vitamin D deficiency, unspecified: Secondary | ICD-10-CM

## 2024-05-16 DIAGNOSIS — G4733 Obstructive sleep apnea (adult) (pediatric): Secondary | ICD-10-CM

## 2024-05-16 DIAGNOSIS — Z7984 Long term (current) use of oral hypoglycemic drugs: Secondary | ICD-10-CM

## 2024-05-16 DIAGNOSIS — Z7985 Long-term (current) use of injectable non-insulin antidiabetic drugs: Secondary | ICD-10-CM

## 2024-05-16 DIAGNOSIS — E7849 Other hyperlipidemia: Secondary | ICD-10-CM

## 2024-05-16 DIAGNOSIS — I509 Heart failure, unspecified: Secondary | ICD-10-CM

## 2024-05-16 DIAGNOSIS — E1169 Type 2 diabetes mellitus with other specified complication: Secondary | ICD-10-CM

## 2024-05-16 DIAGNOSIS — Z6834 Body mass index (BMI) 34.0-34.9, adult: Secondary | ICD-10-CM

## 2024-05-16 DIAGNOSIS — E669 Obesity, unspecified: Secondary | ICD-10-CM

## 2024-05-16 MED ORDER — ALBUTEROL SULFATE HFA 108 (90 BASE) MCG/ACT IN AERS: 1.0000 | INHALATION_SPRAY | Freq: Three times a day (TID) | RESPIRATORY_TRACT | 0 refills | Status: AC | PRN

## 2024-05-25 ENCOUNTER — Telehealth: Payer: Self-pay | Admitting: Cardiology

## 2024-05-25 DIAGNOSIS — Z01818 Encounter for other preprocedural examination: Secondary | ICD-10-CM

## 2024-05-25 NOTE — Telephone Encounter (Signed)
   Pre-operative Risk Assessment    Patient Name: Christopher Wilkins.  DOB: 1955-12-04 MRN: [id number]      Request for Surgical Clearance    Procedure:  Circumcision  Date of Surgery:  Clearance 06/26/24                                 Surgeon:  Dr. Lovie Socks Group or Practice Name:  [facility] Phone number:  [phone] Fax number:  [phone]   Type of Clearance Requested:   - Medical  - Pharmacy:  Hold Apixaban  (Eliquis ) 2 Days   Type of Anesthesia:  General    Additional requests/questions:    Bonney Rojelio Kays   05/25/2024, 4:03 PM

## 2024-05-25 NOTE — Telephone Encounter (Signed)
 I will send back to preop APP for recommendations if pt will need an appt in office or tele preop appt a few days after labs have been done.

## 2024-05-25 NOTE — Telephone Encounter (Signed)
 Patient needs CBC for pharm to decide on holding Eliquis . Please order.

## 2024-05-28 ENCOUNTER — Telehealth: Payer: Self-pay | Admitting: *Deleted

## 2024-05-28 NOTE — Telephone Encounter (Signed)
 Pt has been scheduled tele preop appt 06/14/24. Med rec and consent are done.    Pt also will come in this week for lab work CBC to be done. Pt has been given address for [employer] for labs.        Patient Consent for Virtual Visit        Christopher Wilkins. has provided verbal consent on 05/28/2024 for a virtual visit (video or telephone).   CONSENT FOR VIRTUAL VISIT FOR:  Christopher Wilkins.  By participating in this virtual visit I agree to the following:  I hereby voluntarily request, consent and authorize [facility] and its employed or contracted physicians, physician assistants, nurse practitioners or other licensed health care professionals (the Practitioner), to provide me with telemedicine health care services (the "Services) as deemed necessary by the treating Practitioner. I acknowledge and consent to receive the Services by the Practitioner via telemedicine. I understand that the telemedicine visit will involve communicating with the Practitioner through live audiovisual communication technology and the disclosure of certain medical information by electronic transmission. I acknowledge that I have been given the opportunity to request an in-person assessment or other available alternative prior to the telemedicine visit and am voluntarily participating in the telemedicine visit.  I understand that I have the right to withhold or withdraw my consent to the use of telemedicine in the course of my care at any time, without affecting my right to future care or treatment, and that the Practitioner or I may terminate the telemedicine visit at any time. I understand that I have the right to inspect all information obtained and/or recorded in the course of the telemedicine visit and may receive copies of available information for a reasonable fee.  I understand that some of the potential risks of receiving the Services via telemedicine include:  Delay or interruption in medical evaluation  due to technological equipment failure or disruption; Information transmitted may not be sufficient (e.g. poor resolution of images) to allow for appropriate medical decision making by the Practitioner; and/or  In rare instances, security protocols could fail, causing a breach of personal health information.  Furthermore, I acknowledge that it is my responsibility to provide information about my medical history, conditions and care that is complete and accurate to the best of my ability. I acknowledge that Practitioner's advice, recommendations, and/or decision may be based on factors not within their control, such as incomplete or inaccurate data provided by me or distortions of diagnostic images or specimens that may result from electronic transmissions. I understand that the practice of medicine is not an exact science and that Practitioner makes no warranties or guarantees regarding treatment outcomes. I acknowledge that a copy of this consent can be made available to me via my patient portal [facility] MyChart), or I can request a printed copy by calling the office of [facility].    I understand that my insurance will be billed for this visit.   I have read or had this consent read to me. I understand the contents of this consent, which adequately explains the benefits and risks of the Services being provided via telemedicine.  I have been provided ample opportunity to ask questions regarding this consent and the Services and have had my questions answered to my satisfaction. I give my informed consent for the services to be provided through the use of telemedicine in my medical care

## 2024-05-28 NOTE — Addendum Note (Signed)
 Addended by: Kymberly Blomberg M on: 05/28/2024 01:14 PM   Modules accepted: Orders

## 2024-05-28 NOTE — Telephone Encounter (Signed)
 Pt has been scheduled tele preop appt 06/14/24. Med rec and consent are done.   Pt also will come in this week for lab work CBC to be done. Pt has been given address for [employer] for labs.

## 2024-06-02 LAB — CBC
Hematocrit: 49.9 % (ref 37.5–51.0)
Hemoglobin: 16.2 g/dL (ref 13.0–17.7)
MCH: 27.5 pg (ref 26.6–33.0)
MCHC: 32.5 g/dL (ref 31.5–35.7)
MCV: 85 fL (ref 79–97)
Platelets: 244 x10E3/uL (ref 150–450)
RBC: 5.89 x10E6/uL — ABNORMAL HIGH (ref 4.14–5.80)
RDW: 15.3 % (ref 11.6–15.4)
WBC: 5.6 x10E3/uL (ref 3.4–10.8)

## 2024-06-07 NOTE — Telephone Encounter (Signed)
 Patient with diagnosis of afib on Eliquis  for anticoagulation.    Procedure: Circumcision  Date of procedure: 06/26/24   CHA2DS2-VASc Score = 4   This indicates a 4.8% annual risk of stroke. The patient's score is based upon: CHF History: 1 HTN History: 1 Diabetes History: 1 Stroke History: 0 Vascular Disease History: 0 Age Score: 1 Gender Score: 0      CrCl 70 ml/min Platelet count 244  Patient has not had an Afib/aflutter ablation within the last 3 months or DCCV within the last 30 days  Per office protocol, patient can hold Eliquis  for 2 days prior to procedure.    **This guidance is not considered finalized until pre-operative APP has relayed final recommendations.**

## 2024-06-07 NOTE — Telephone Encounter (Signed)
 Pharmacy please advise on holding Eliquis  prior to circumcision scheduled for 06/26/2024. Thank you.

## 2024-06-14 ENCOUNTER — Other Ambulatory Visit (INDEPENDENT_AMBULATORY_CARE_PROVIDER_SITE_OTHER): Payer: Self-pay | Admitting: Physician Assistant

## 2024-06-14 ENCOUNTER — Ambulatory Visit: Attending: Cardiology | Admitting: Student

## 2024-06-14 DIAGNOSIS — Z01810 Encounter for preprocedural cardiovascular examination: Secondary | ICD-10-CM | POA: Diagnosis not present

## 2024-06-14 DIAGNOSIS — I509 Heart failure, unspecified: Secondary | ICD-10-CM

## 2024-06-14 NOTE — Progress Notes (Signed)
 Virtual Visit via Telephone Note   Because of Christopher Vasil Jr.'s co-morbid illnesses, he is at least at moderate risk for complications without adequate follow up.  This format is felt to be most appropriate for this patient at this time.  The patient did not have access to video technology/had technical difficulties with video requiring transitioning to audio format only (telephone).  All issues noted in this document were discussed and addressed.  No physical exam could be performed with this format.  Please refer to the patient's chart for his consent to telehealth for [facility].  Evaluation Performed:  Preoperative cardiovascular risk assessment _____________   Date:  06/14/2024   Patient ID:  Christopher Molt Raddle., DOB 1956/07/18, MRN [id number] Patient Location:  Home Provider location:   Office  Primary Care Provider:  Leontine Cramp, NP Primary Cardiologist:  Lonni LITTIE Nanas, MD  Chief Complaint / Patient Profile   68 y.o. y/o male with a h/o chronic HFrEF/nonischemic cardiomyopathy with improved LV function, aortic stenosis, PAF s/p A-fib ablation September 2023 on anticoagulation, OSA on CPAP, hypertension, hyperlipidemia, T2DM, CKD stage III who is pending circumcision by Dr. Lovie on 06/26/2024 and presents today for telephonic preoperative cardiovascular risk assessment.  History of Present Illness    Christopher Ryser. is a 68 y.o. male who presents via audio/video conferencing for a telehealth visit today.  Pt was last seen in cardiology clinic on 01/18/2024 by Dr. Nanas.  At that time Christopher Stuard. was stable from a cardiac standpoint.  The patient is now pending procedure as outlined above. Since his last visit, he is doing well. Patient denies shortness of breath, dyspnea on exertion, lower extremity edema, orthopnea or PND. No chest pain, pressure, or tightness. No palpitations.  He is not experiencing light headedness, dizziness, presyncope or syncope. He is very  active walking in the mall for 1 hour daily.   Past Medical History    Past Medical History:  Diagnosis Date   Atrial fibrillation (HCC)    CHF (congestive heart failure) (HCC)    Diabetes (HCC)    Dyspnea    Elevated LFTs    Fatty liver    Hypertension    Insomnia    Prediabetes    Sleep apnea    SOB (shortness of breath)    Tubular adenoma of colon    Vitamin D  deficiency    Past Surgical History:  Procedure Laterality Date   ATRIAL FIBRILLATION ABLATION N/A 06/23/2022   Procedure: ATRIAL FIBRILLATION ABLATION;  Surgeon: Inocencio Soyla Lunger, MD;  Location: [facility] INVASIVE CV LAB;  Service: Cardiovascular;  Laterality: N/A;   CARDIOVERSION N/A 06/20/2020   Procedure: CARDIOVERSION;  Surgeon: Jeffrie Oneil BROCKS, MD;  Location: [facility] ENDOSCOPY;  Service: Cardiovascular;  Laterality: N/A;   CARDIOVERSION N/A 08/20/2020   Procedure: CARDIOVERSION;  Surgeon: Cherrie Toribio SAUNDERS, MD;  Location: [facility] ENDOSCOPY;  Service: Cardiovascular;  Laterality: N/A;   CHOLECYSTECTOMY     OTHER SURGICAL HISTORY     Shot a couple of times   RIGHT/LEFT HEART CATH AND CORONARY ANGIOGRAPHY N/A 08/18/2020   Procedure: RIGHT/LEFT HEART CATH AND CORONARY ANGIOGRAPHY;  Surgeon: Cherrie Toribio SAUNDERS, MD;  Location: [facility] INVASIVE CV LAB;  Service: Cardiovascular;  Laterality: N/A;   TEE WITHOUT CARDIOVERSION N/A 06/20/2020   Procedure: TRANSESOPHAGEAL ECHOCARDIOGRAM (TEE);  Surgeon: Jeffrie Oneil BROCKS, MD;  Location: [facility] ENDOSCOPY;  Service: Cardiovascular;  Laterality: N/A;   TEE WITHOUT CARDIOVERSION N/A 08/20/2020   Procedure: TRANSESOPHAGEAL ECHOCARDIOGRAM (TEE);  Surgeon:  Bensimhon, Toribio SAUNDERS, MD;  Location: [facility] ENDOSCOPY;  Service: Cardiovascular;  Laterality: N/A;    Allergies  Allergies  Allergen Reactions   Codeine Nausea And Vomiting   Lisinopril  Other (See Comments)    Didn't do well in my body    Home Medications    Prior to Admission medications   Medication Sig Start Date End Date Taking? Authorizing  Provider  albuterol  (VENTOLIN  HFA) 108 (90 Base) MCG/ACT inhaler Inhale 1-2 puffs into the lungs every 8 (eight) hours as needed for wheezing or shortness of breath. 05/16/24   Rayburn, Elouise Phlegm, PA-C  apixaban  (ELIQUIS ) 5 MG TABS tablet TAKE 1 TABLET (5 MG TOTAL) BY MOUTH 2 (TWO) TIMES DAILY. 11/12/20   Desiderio Chew, PA-C  atorvastatin  (LIPITOR) 80 MG tablet Take 80 mg by mouth every evening. 02/16/22   [provider]  blood glucose meter kit and supplies KIT Dispense based on patient and insurance preference. Use up to two times daily as directed. 03/01/22   Verdon Parry D, MD  Blood Glucose Monitoring Suppl Supplies MISC 1 strip by Does not apply route 2 (two) times daily. 09/14/22   Rayburn, Elouise Phlegm, PA-C  Cholecalciferol (VITAMIN D ) 50 MCG (2000 UT) tablet Take 2,000 Units by mouth daily.    [provider]  dapagliflozin  propanediol (FARXIGA ) 10 MG TABS tablet Take 10 mg by mouth daily.    [provider]  ENTRESTO  49-51 MG Take 1 tablet by mouth 2 (two) times daily. 02/06/21   [provider]  ezetimibe (ZETIA) 10 MG tablet Take 10 mg by mouth daily.    [provider]  furosemide  (LASIX ) 80 MG tablet Take 80 mg by mouth.    [provider]  metFORMIN (GLUCOPHAGE-XR) 500 MG 24 hr tablet Take 500 mg by mouth daily. 06/17/22   [provider]  metoprolol  succinate (TOPROL -XL) 25 MG 24 hr tablet Take 25 mg by mouth daily.    [provider]  Misc. Devices (GNP DIGITAL WEIGHT SCALE) MISC 1 Device by Does not apply route daily. Please provide patient with insurance approved scale ICD110 I50.2 10/29/20   Fleming, Zelda W, NP  OZEMPIC , 0.25 OR 0.5 MG/DOSE, 2 MG/3ML SOPN INJECT 0.25 SUBCUTANEOUSLY ONCE WEEKLY FOR 4 WEEKS THEN INCREASE TO 0.5 MG WEEKLY 09/03/23   [provider]  potassium chloride  SA (KLOR-CON  M20) 20 MEQ tablet TAKE 1 TABLET BY MOUTH DAILY. PLEASE CONTACT THE OFFICE TO SCHEDULE A FOLLOW UP APPT  1ST ATTEMPT 10/26/23   Kate Lonni CROME, MD  NAPOLEON INHUB 100-50 MCG/ACT AEPB Inhale 1 puff into the lungs 2 (two) times daily. 12/29/22   [provider]    Physical Exam    Vital Signs:  Christopher Joshua Raddle. does not have vital signs available for review today.  Given telephonic nature of communication, physical exam is limited. AAOx3. NAD. Normal affect.  Speech and respirations are unlabored.   Assessment & Plan    Primary Cardiologist: Lonni CROME Kate, MD  Preoperative cardiovascular risk assessment.  Circumcision by Dr. Lovie on 06/26/2024.  Chart reviewed as part of pre-operative protocol coverage. According to the RCRI, patient has a 0.9% risk of MACE. Patient reports activity equivalent to >4.0 METS (walks for an hour at the mall daily).   Given past medical history and time since last visit, based on ACC/AHA guidelines, Christopher Dirico. would be at acceptable risk for the planned procedure without further cardiovascular testing.   Patient was advised that if he develops new  symptoms prior to surgery to contact our office to arrange a follow-up appointment.  he verbalized understanding.  Per Pharm D, patient has not had an Afib/aflutter ablation within the last 3 months or DCCV within the last 30 days. Patient may hold Eliquis  for 2 days prior to procedure.   I will route this recommendation to the requesting party via Epic fax function.  Please call with questions.  Time:   Today, I have spent 5 minutes with the patient with telehealth technology discussing medical history, symptoms, and management plan.     Christopher Hila, NP  06/14/2024, 7:40 AM

## 2024-06-25 NOTE — Progress Notes (Deleted)
   SUBJECTIVE: Discussed the use of AI scribe software for clinical note transcription with the patient, who gave verbal consent to proceed.  Chief Complaint: Obesity  Interim History: ***  Christopher Wilkins is here to discuss his progress with his obesity treatment plan. He is on the {HWW Weight Loss Plan:210964005} and states he {CHL AMB IS/IS NOT:210130109} following his eating plan approximately *** % of the time. He states he {CHL AMB IS/IS NOT:210130109} exercising *** minutes *** times per week.   OBJECTIVE: Visit Diagnoses: Problem List Items Addressed This Visit     CHF (congestive heart failure) (HCC)   Type 2 diabetes mellitus with other specified complication (HCC) - Primary   Vitamin D  deficiency   Obesity- Start BMI 38.47   Other Visit Diagnoses       Hyperlipidemia associated with type 2 diabetes mellitus (HCC)         OSA on CPAP         Phimosis of penis           No data recorded No data recorded No data recorded No data recorded   ASSESSMENT AND PLAN:  Diet: Christopher Wilkins {CHL AMB IS/IS NOT:210130109} currently in the action stage of change. As such, his goal is to {HWW Weight Loss Efforts:210964006}. He {HAS HAS WNU:81165} agreed to {HWW Weight Loss Plan:210964005}.  Exercise: Christopher Wilkins has been instructed {HWW Exercise:210964007} for weight loss and overall health benefits.   Behavior Modification:  We discussed the following Behavioral Modification Strategies today: {HWW Behavior Modification:210964008}. We discussed various medication options to help Christopher Wilkins with his weight loss efforts and we both agreed to ***.  No follow-ups on file.[name] He was informed of the importance of frequent follow up visits to maximize his success with intensive lifestyle modifications for his multiple health conditions.  Attestation Statements:   Reviewed by clinician on day of visit: allergies, medications, problem list, medical history, surgical history, family history, social history, and  previous encounter notes.   Time spent on visit including pre-visit chart review and post-visit care and charting was *** minutes.    Porcha Deblanc, PA-C

## 2024-06-27 ENCOUNTER — Ambulatory Visit (INDEPENDENT_AMBULATORY_CARE_PROVIDER_SITE_OTHER): Admitting: Physician Assistant

## 2024-06-27 DIAGNOSIS — G4733 Obstructive sleep apnea (adult) (pediatric): Secondary | ICD-10-CM

## 2024-06-27 DIAGNOSIS — E559 Vitamin D deficiency, unspecified: Secondary | ICD-10-CM

## 2024-06-27 DIAGNOSIS — E1169 Type 2 diabetes mellitus with other specified complication: Secondary | ICD-10-CM

## 2024-06-27 DIAGNOSIS — I509 Heart failure, unspecified: Secondary | ICD-10-CM

## 2024-06-27 DIAGNOSIS — E669 Obesity, unspecified: Secondary | ICD-10-CM

## 2024-06-27 DIAGNOSIS — N471 Phimosis: Secondary | ICD-10-CM

## 2024-06-28 ENCOUNTER — Other Ambulatory Visit: Payer: Self-pay | Admitting: Urology

## 2024-07-10 ENCOUNTER — Ambulatory Visit: Admitting: Podiatry

## 2024-07-17 ENCOUNTER — Ambulatory Visit (HOSPITAL_COMMUNITY)
Admission: RE | Admit: 2024-07-17 | Discharge: 2024-07-17 | Disposition: A | Discharge status: Home / Self Care | Source: Ambulatory Visit | Attending: Student in an Organized Health Care Education/Training Program | Admitting: Student in an Organized Health Care Education/Training Program

## 2024-07-17 DIAGNOSIS — I350 Nonrheumatic aortic (valve) stenosis: Secondary | ICD-10-CM | POA: Diagnosis not present

## 2024-07-17 DIAGNOSIS — I4819 Other persistent atrial fibrillation: Secondary | ICD-10-CM | POA: Insufficient documentation

## 2024-07-17 LAB — ECHOCARDIOGRAM COMPLETE
AR max vel: 1.43 cm2
AV Area VTI: 1.36 cm2
AV Area mean vel: 1.39 cm2
AV Mean grad: 17 mmHg
AV Peak grad: 29.8 mmHg
Ao pk vel: 2.73 m/s
Area-P 1/2: 3.6 cm2
MV M vel: 5.31 m/s
MV Peak grad: 112.6 mmHg
S' Lateral: 3.6 cm

## 2024-07-18 ENCOUNTER — Ambulatory Visit: Payer: Self-pay | Admitting: Cardiology

## 2024-07-18 DIAGNOSIS — I350 Nonrheumatic aortic (valve) stenosis: Secondary | ICD-10-CM

## 2024-07-31 NOTE — Progress Notes (Signed)
 COVID Vaccine Completed: yes  Date of COVID positive in last 90 days:  PCP - Luke Miyamoto, NP Cardiologist - Lonni Nanas, MD Electrophysiologist- Soyla Lunger, MD  Cardiac clearance by Barnie Grace, NP 06/14/24 in Epic  Chest x-ray - N/A EKG - 01/18/24 Epic Stress Test - N/A ECHO - 07/17/24 Epic Cardiac Cath - 08/18/20 Epic Pacemaker/ICD device last checked:N/A Spinal Cord Stimulator:N/A  Bowel Prep - N/A  Sleep Study - yes CPAP - yes every night  Fasting Blood Sugar - 80-99 Checks Blood Sugar every other day  Last dose of GLP1 agonist-  Ozempic , takes Sundays. Last dose 07/22/24 GLP1 instructions:  Do not take after  07/31/24   Last dose of SGLT-2 inhibitors-  Farxiga  SGLT-2 instructions:  Do not take after  08/03/24   Blood Thinner Instructions: Eliquis , hold 2 days Aspirin  Instructions:N/A Last Dose: 08/04/24 1430  Activity level: Can go up a flight of stairs and perform activities of daily living without stopping and without symptoms of chest pain or shortness of breath.   Anesthesia review: HTN, a fib, CHF, cardiomyopathy, fatty liver, DM2, CKD  Patient denies shortness of breath, fever, cough and chest pain at PAT appointment  Patient verbalized understanding of instructions that were given to them at the PAT appointment. Patient was also instructed that they will need to review over the PAT instructions again at home before surgery.

## 2024-07-31 NOTE — Patient Instructions (Signed)
 SURGICAL WAITING ROOM VISITATION  Patients having surgery or a procedure may have no more than 2 support people in the waiting area - these visitors may rotate.    Children under the age of 21 must have an adult with them who is not the patient.  Visitors with respiratory illnesses are discouraged from visiting and should remain at home.  If the patient needs to stay at the hospital during part of their recovery, the visitor guidelines for inpatient rooms apply. Pre-op nurse will coordinate an appropriate time for 1 support person to accompany patient in pre-op.  This support person may not rotate.    Please refer to the [facility] website for the visitor guidelines for Inpatients (after your surgery is over and you are in a regular room).    Your procedure is scheduled on: 08/07/24   Report to [facility] Main Entrance    Report to admitting at 9:55 AM   Call this number if you have problems the morning of surgery [phone]   Do not eat food or drink liquids :After Midnight.          If you have questions, please contact your surgeon's office.   FOLLOW BOWEL PREP AND ANY ADDITIONAL PRE OP INSTRUCTIONS YOU RECEIVED FROM YOUR SURGEON'S OFFICE!!!     Oral Hygiene is also important to reduce your risk of infection.                                    Remember - BRUSH YOUR TEETH THE MORNING OF SURGERY WITH YOUR REGULAR TOOTHPASTE  DENTURES WILL BE REMOVED PRIOR TO SURGERY PLEASE DO NOT APPLY Poly grip OR ADHESIVES!!!   Do NOT smoke after Midnight   Stop all vitamins and herbal supplements 7 days before surgery.   Take these medicines the morning of surgery with A SIP OF WATER: Inhalers, Zetia, Metoprolol    DO NOT TAKE ANY ORAL DIABETIC MEDICATIONS DAY OF YOUR SURGERY  How to Manage Your Diabetes Before and After Surgery  Why is it important to control my blood sugar before and after surgery? Improving blood sugar levels before and after surgery helps healing and  can limit problems. A way of improving blood sugar control is eating a healthy diet by:  Eating less sugar and carbohydrates  Increasing activity/exercise  Talking with your doctor about reaching your blood sugar goals High blood sugars (greater than 180 mg/dL) can raise your risk of infections and slow your recovery, so you will need to focus on controlling your diabetes during the weeks before surgery. Make sure that the doctor who takes care of your diabetes knows about your planned surgery including the date and location.  How do I manage my blood sugar before surgery? Check your blood sugar at least 4 times a day, starting 2 days before surgery, to make sure that the level is not too high or low. Check your blood sugar the morning of your surgery when you wake up and every 2 hours until you get to the Short Stay unit. If your blood sugar is less than 70 mg/dL, you will need to treat for low blood sugar: Do not take insulin . Treat a low blood sugar (less than 70 mg/dL) with  cup of clear juice (cranberry or apple), 4 glucose tablets, OR glucose gel. Recheck blood sugar in 15 minutes after treatment (to make sure it is greater than 70 mg/dL). If  your blood sugar is not greater than 70 mg/dL on recheck, call [phone] for further instructions. Report your blood sugar to the short stay nurse when you get to Short Stay.  If you are admitted to the hospital after surgery: Your blood sugar will be checked by the staff and you will probably be given insulin  after surgery (instead of oral diabetes medicines) to make sure you have good blood sugar levels. The goal for blood sugar control after surgery is 80-180 mg/dL.   WHAT DO I DO ABOUT MY DIABETES MEDICATION?  Do not take oral diabetes medicines (pills) the morning of surgery.  Hold Farxiga  3 days. Do not take after  08/03/24.  Hold Ozempic  7 days, do not take after 07/21/19/25.  DO NOT TAKE THE FOLLOWING 7 DAYS PRIOR TO SURGERY:  Ozempic , Wegovy, Rybelsus (Semaglutide ), Byetta (exenatide), Bydureon (exenatide ER), Victoza, Saxenda (liraglutide), or Trulicity (dulaglutide) Mounjaro (Tirzepatide) Adlyxin (Lixisenatide), Polyethylene Glycol Loxenatide.  Reviewed and Endorsed by [facility] Patient Education Committee, August 2015  Bring CPAP mask and tubing day of surgery.                              You may not have any metal on your body including ewelry, and body piercing             Do not wear lotions, powders, cologne, or deodorant              Men may shave face and neck.   Do not bring valuables to the hospital. [facility] IS NOT             RESPONSIBLE   FOR VALUABLES.   Contacts, glasses, dentures or bridgework may not be worn into surgery.  DO NOT BRING YOUR HOME MEDICATIONS TO THE HOSPITAL. PHARMACY WILL DISPENSE MEDICATIONS LISTED ON YOUR MEDICATION LIST TO YOU DURING YOUR ADMISSION IN THE HOSPITAL!    Patients discharged on the day of surgery will not be allowed to drive home.  Someone NEEDS to stay with you for the first 24 hours after anesthesia.              Please read over the following fact sheets you were given: IF YOU HAVE QUESTIONS ABOUT YOUR PRE-OP INSTRUCTIONS PLEASE CALL [phone].   If you received a COVID test during your pre-op visit  it is requested that you wear a mask when out in public, stay away from anyone that may not be feeling well and notify your surgeon if you develop symptoms. If you test positive for Covid or have been in contact with anyone that has tested positive in the last 10 days please notify you surgeon.    [facility] - Preparing for Surgery Before surgery, you can play an important role.  Because skin is not sterile, your skin needs to be as free of germs as possible.  You can reduce the number of germs on your skin by washing with CHG (chlorahexidine gluconate) soap before surgery.  CHG is an antiseptic cleaner which kills germs and bonds with the skin  to continue killing germs even after washing. Please DO NOT use if you have an allergy to CHG or antibacterial soaps.  If your skin becomes reddened/irritated stop using the CHG and inform your nurse when you arrive at Short Stay. Do not shave (including legs and underarms) for at least 48 hours prior to the first CHG shower.  You may  shave your face/neck.  Please follow these instructions carefully:  1.  Shower with CHG Soap the night before surgery ONLY (DO NOT USE THE SOAP THE MORNING OF SURGERY).  2.  If you choose to wash your hair, wash your hair first as usual with your normal  shampoo.  3.  After you shampoo, rinse your hair and body thoroughly to remove the shampoo.                             4.  Use CHG as you would any other liquid soap.  You can apply chg directly to the skin and wash.  Gently with a scrungie or clean washcloth.  5.  Apply the CHG Soap to your body ONLY FROM THE NECK DOWN.   Do   not use on face/ open                           Wound or open sores. Avoid contact with eyes, ears mouth and   genitals (private parts).                       Wash face,  Genitals (private parts) with your normal soap.             6.  Wash thoroughly, paying special attention to the area where your    surgery  will be performed.  7.  Thoroughly rinse your body with warm water from the neck down.  8.  DO NOT shower/wash with your normal soap after using and rinsing off the CHG Soap.                9.  Pat yourself dry with a clean towel.            10.  Wear clean pajamas.            11.  Place clean sheets on your bed the night of your first shower and do not  sleep with pets. Day of Surgery : Do not apply any CHG, lotions/deodorants the morning of surgery.  Please wear clean clothes to the hospital/surgery center.  FAILURE TO FOLLOW THESE INSTRUCTIONS MAY RESULT IN THE CANCELLATION OF YOUR SURGERY  PATIENT SIGNATURE_________________________________  NURSE  SIGNATURE__________________________________  ________________________________________________________________________

## 2024-08-01 ENCOUNTER — Other Ambulatory Visit: Payer: Self-pay

## 2024-08-01 ENCOUNTER — Encounter (HOSPITAL_COMMUNITY): Payer: Self-pay

## 2024-08-01 ENCOUNTER — Encounter (HOSPITAL_COMMUNITY)
Admission: RE | Admit: 2024-08-01 | Discharge: 2024-08-01 | Disposition: A | Discharge status: Home / Self Care | Source: Ambulatory Visit | Attending: Urology | Admitting: Urology

## 2024-08-01 VITALS — BP 126/75 | HR 77 | Temp 97.7°F | Resp 16 | Ht 68.0 in | Wt 222.0 lb

## 2024-08-01 DIAGNOSIS — Z7901 Long term (current) use of anticoagulants: Secondary | ICD-10-CM | POA: Insufficient documentation

## 2024-08-01 DIAGNOSIS — I5022 Chronic systolic (congestive) heart failure: Secondary | ICD-10-CM | POA: Diagnosis not present

## 2024-08-01 DIAGNOSIS — Z01812 Encounter for preprocedural laboratory examination: Secondary | ICD-10-CM | POA: Insufficient documentation

## 2024-08-01 DIAGNOSIS — E1169 Type 2 diabetes mellitus with other specified complication: Secondary | ICD-10-CM | POA: Diagnosis not present

## 2024-08-01 DIAGNOSIS — N183 Chronic kidney disease, stage 3 unspecified: Secondary | ICD-10-CM | POA: Insufficient documentation

## 2024-08-01 DIAGNOSIS — I350 Nonrheumatic aortic (valve) stenosis: Secondary | ICD-10-CM | POA: Diagnosis not present

## 2024-08-01 DIAGNOSIS — N471 Phimosis: Secondary | ICD-10-CM | POA: Diagnosis not present

## 2024-08-01 DIAGNOSIS — E1122 Type 2 diabetes mellitus with diabetic chronic kidney disease: Secondary | ICD-10-CM | POA: Insufficient documentation

## 2024-08-01 DIAGNOSIS — I130 Hypertensive heart and chronic kidney disease with heart failure and stage 1 through stage 4 chronic kidney disease, or unspecified chronic kidney disease: Secondary | ICD-10-CM | POA: Insufficient documentation

## 2024-08-01 DIAGNOSIS — Z7984 Long term (current) use of oral hypoglycemic drugs: Secondary | ICD-10-CM | POA: Insufficient documentation

## 2024-08-01 DIAGNOSIS — G4730 Sleep apnea, unspecified: Secondary | ICD-10-CM | POA: Diagnosis not present

## 2024-08-01 DIAGNOSIS — I428 Other cardiomyopathies: Secondary | ICD-10-CM | POA: Diagnosis not present

## 2024-08-01 DIAGNOSIS — I480 Paroxysmal atrial fibrillation: Secondary | ICD-10-CM | POA: Diagnosis not present

## 2024-08-01 DIAGNOSIS — Z01818 Encounter for other preprocedural examination: Secondary | ICD-10-CM | POA: Diagnosis present

## 2024-08-01 HISTORY — DX: Pneumonia, unspecified organism: J18.9

## 2024-08-01 HISTORY — DX: Gastro-esophageal reflux disease without esophagitis: K21.9

## 2024-08-01 LAB — BASIC METABOLIC PANEL WITH GFR
Anion gap: 12 (ref 5–15)
BUN: 15 mg/dL (ref 8–23)
CO2: 26 mmol/L (ref 22–32)
Calcium: 9.1 mg/dL (ref 8.9–10.3)
Chloride: 106 mmol/L (ref 98–111)
Creatinine, Ser: 1.05 mg/dL (ref 0.61–1.24)
GFR, Estimated: >60 mL/min (ref >60)
Glucose, Bld: 98 mg/dL (ref 70–99)
Potassium: 3.9 mmol/L (ref 3.5–5.1)
Sodium: 143 mmol/L (ref 135–145)

## 2024-08-01 LAB — HEMOGLOBIN A1C
Hgb A1c MFr Bld: 6 % — ABNORMAL HIGH (ref 4.8–5.6)
Mean Plasma Glucose: 125.5 mg/dL

## 2024-08-01 LAB — CBC
HCT: 52.4 % — ABNORMAL HIGH (ref 39.0–52.0)
Hemoglobin: 16.6 g/dL (ref 13.0–17.0)
MCH: 27.3 pg (ref 26.0–34.0)
MCHC: 31.7 g/dL (ref 30.0–36.0)
MCV: 86 fL (ref 80.0–100.0)
Platelets: 239 K/uL (ref 150–400)
RBC: 6.09 MIL/uL — ABNORMAL HIGH (ref 4.22–5.81)
RDW: 18.2 % — ABNORMAL HIGH (ref 11.5–15.5)
WBC: 5.5 K/uL (ref 4.0–10.5)
nRBC: 0 % (ref 0.0–0.2)

## 2024-08-01 LAB — GLUCOSE, CAPILLARY: Glucose-Capillary: 104 mg/dL — ABNORMAL HIGH (ref 70–99)

## 2024-08-02 NOTE — Anesthesia Preprocedure Evaluation (Addendum)
 Anesthesia Evaluation  Patient identified by MRN, date of birth, ID band Patient awake    Reviewed: Allergy & Precautions, NPO status , Patient's Chart, lab work & pertinent test results, reviewed documented beta blocker date and time   History of Anesthesia Complications Negative for: history of anesthetic complications  Airway Mallampati: II       Dental  (+) Missing, Poor Dentition, Dental Advisory Given, Edentulous Upper   Pulmonary sleep apnea , Current Smoker and Patient abstained from smoking.   breath sounds clear to auscultation       Cardiovascular hypertension, Pt. on medications and Pt. on home beta blockers +CHF  + dysrhythmias (AFib s/p Ablation (2023)) + Valvular Problems/Murmurs AS and MR  Rhythm:Regular  TTE (07/2024): 1. Left ventricular ejection fraction, by estimation, is 55 to 60%. Left  ventricular ejection fraction by 3D volume is 57 %. The left ventricle has  normal function. The left ventricle has no regional wall motion  abnormalities. There is mild left  ventricular hypertrophy. Left ventricular diastolic parameters are  consistent with Grade I diastolic dysfunction (impaired relaxation). The  average left ventricular global longitudinal strain is -18.9 %. The global  longitudinal strain is normal.   2. Right ventricular systolic function is low normal. The right  ventricular size is normal. Tricuspid regurgitation signal is inadequate  for assessing PA pressure.   3. Calcified chords. The mitral valve is degenerative. Mild to moderate  mitral valve regurgitation. The mean mitral valve gradient is 2.7 mmHg.  Severe mitral annular calcification.   4. The aortic valve is tricuspid. There is moderate calcification of the  aortic valve. There is moderate thickening of the aortic valve. Aortic  valve regurgitation is trivial. Mild to moderate aortic valve stenosis.  Aortic valve mean gradient measures   17.0 mmHg. Aortic valve Vmax measures 2.73 m/s.   5. The inferior vena cava is normal in size with <50% respiratory  variability, suggesting right atrial pressure of 8 mmHg.     Neuro/Psych    GI/Hepatic ,GERD  ,,  Endo/Other  diabetes, Type 2    Renal/GU      Musculoskeletal   Abdominal   Peds  Hematology   Anesthesia Other Findings   Reproductive/Obstetrics                              Anesthesia Physical Anesthesia Plan  ASA: 3  Anesthesia Plan: General   Post-op Pain Management:    Induction: Intravenous  PONV Risk Score and Plan: 2 and Ondansetron  and Treatment may vary due to age or medical condition  Airway Management Planned: LMA  Additional Equipment:   Intra-op Plan:   Post-operative Plan: Extubation in OR  Informed Consent:      Dental advisory given  Plan Discussed with: CRNA and Surgeon  Anesthesia Plan Comments: (See PAT note 08/01/24)         Anesthesia Quick Evaluation

## 2024-08-02 NOTE — Progress Notes (Signed)
 Anesthesia Chart Review   Case: [id number] Date/Time: 08/07/24 1154   Procedure: CIRCUMCISION, ADULT   Anesthesia type: General   Diagnosis: Phimosis [N47.1]   Pre-op diagnosis: PHIMOSIS   Location: [facility] ROOM 03 / [facility]   Surgeons: Lovie Arlyss CROME, MD       DISCUSSION:68 y.o. smoker with h/o HTN, CHF, mild to moderate AS, sleep apnea, paroxysmal atrial fibrillation s/p ablation 2023, chronic HFrEF/nonischemic cardiomyopathy with improved LV function, DM II, CKD Stage III, phimosis scheduled for above procedure 08/07/2024 with Dr. Arlyss Lovie.   Per cardiology preoperative evaluation 06/14/2024, Chart reviewed as part of pre-operative protocol coverage. According to the RCRI, patient has a 0.9% risk of MACE. Patient reports activity equivalent to >4.0 METS (walks for an hour at the mall daily).    Given past medical history and time since last visit, based on ACC/AHA guidelines, Christopher Wilkins. would be at acceptable risk for the planned procedure without further cardiovascular testing.    Patient was advised that if he develops new symptoms prior to surgery to contact our office to arrange a follow-up appointment.  he verbalized understanding.   Per Pharm D, patient has not had an Afib/aflutter ablation within the last 3 months or DCCV within the last 30 days. Patient may hold Eliquis  for 2 days prior to procedure.  Pt advised to hold Ozempic  1 week prior to surgery.   VS: BP 126/75   Pulse 77   Temp 36.5 C (Oral)   Resp 16   Ht 5' 8 (1.727 m)   Wt 100.7 kg   SpO2 98%   BMI 33.75 kg/m   PROVIDERS: Leontine Cramp, NP is PCP   Cardiologist - Lonni Nanas, MD  LABS: Labs reviewed: Acceptable for surgery. (all labs ordered are listed, but only abnormal results are displayed)  Labs Reviewed  HEMOGLOBIN A1C - Abnormal; Notable for the following components:      Result Value   Hgb A1c MFr Bld 6.0 (*)    All other components within normal limits  CBC - Abnormal; Notable  for the following components:   RBC 6.09 (*)    HCT 52.4 (*)    RDW 18.2 (*)    All other components within normal limits  GLUCOSE, CAPILLARY - Abnormal; Notable for the following components:   Glucose-Capillary 104 (*)    All other components within normal limits  BASIC METABOLIC PANEL WITH GFR     IMAGES:   EKG:   CV: Echo 07/17/2024 1. Left ventricular ejection fraction, by estimation, is 55 to 60%. Left  ventricular ejection fraction by 3D volume is 57 %. The left ventricle has  normal function. The left ventricle has no regional wall motion  abnormalities. There is mild left  ventricular hypertrophy. Left ventricular diastolic parameters are  consistent with Grade I diastolic dysfunction (impaired relaxation). The  average left ventricular global longitudinal strain is -18.9 %. The global  longitudinal strain is normal.   2. Right ventricular systolic function is low normal. The right  ventricular size is normal. Tricuspid regurgitation signal is inadequate  for assessing PA pressure.   3. Calcified chords. The mitral valve is degenerative. Mild to moderate  mitral valve regurgitation. The mean mitral valve gradient is 2.7 mmHg.  Severe mitral annular calcification.   4. The aortic valve is tricuspid. There is moderate calcification of the  aortic valve. There is moderate thickening of the aortic valve. Aortic  valve regurgitation is trivial. Mild to moderate aortic valve stenosis.  Aortic valve mean gradient measures  17.0 mmHg. Aortic valve Vmax measures 2.73 m/s.   5. The inferior vena cava is normal in size with <50% respiratory  variability, suggesting right atrial pressure of 8 mmHg.  Past Medical History:  Diagnosis Date   Atrial fibrillation (HCC)    CHF (congestive heart failure) (HCC)    Diabetes (HCC)    Dyspnea    Elevated LFTs    Fatty liver    GERD (gastroesophageal reflux disease)    Hypertension    Insomnia    Pneumonia    Prediabetes    Sleep  apnea    SOB (shortness of breath)    Tubular adenoma of colon    Vitamin D  deficiency     Past Surgical History:  Procedure Laterality Date   ATRIAL FIBRILLATION ABLATION N/A 06/23/2022   Procedure: ATRIAL FIBRILLATION ABLATION;  Surgeon: Inocencio Soyla Lunger, MD;  Location[facility] INVASIVE CV LAB;  Service: Cardiovascular;  Laterality: N/A;   CARDIOVERSION N/A 06/20/2020   Procedure: CARDIOVERSION;  Surgeon: Jeffrie Oneil BROCKS, MD;  Location: [facility] ENDOSCOPY;  Service: Cardiovascular;  Laterality: N/A;   CARDIOVERSION N/A 08/20/2020   Procedure: CARDIOVERSION;  Surgeon: Cherrie Toribio SAUNDERS, MD;  Location: [facility] ENDOSCOPY;  Service: Cardiovascular;  Laterality: N/A;   CHOLECYSTECTOMY     COLONOSCOPY     OTHER SURGICAL HISTORY     Shot a couple of times   RIGHT/LEFT HEART CATH AND CORONARY ANGIOGRAPHY N/A 08/18/2020   Procedure: RIGHT/LEFT HEART CATH AND CORONARY ANGIOGRAPHY;  Surgeon: Cherrie Toribio SAUNDERS, MD;  Location: [facility] INVASIVE CV LAB;  Service: Cardiovascular;  Laterality: N/A;   TEE WITHOUT CARDIOVERSION N/A 06/20/2020   Procedure: TRANSESOPHAGEAL ECHOCARDIOGRAM (TEE);  Surgeon: Jeffrie Oneil BROCKS, MD;  Location: [facility] ENDOSCOPY;  Service: Cardiovascular;  Laterality: N/A;   TEE WITHOUT CARDIOVERSION N/A 08/20/2020   Procedure: TRANSESOPHAGEAL ECHOCARDIOGRAM (TEE);  Surgeon: Cherrie Toribio SAUNDERS, MD;  Location: [facility] ENDOSCOPY;  Service: Cardiovascular;  Laterality: N/A;    MEDICATIONS:  albuterol  (VENTOLIN  HFA) 108 (90 Base) MCG/ACT inhaler   apixaban  (ELIQUIS ) 5 MG TABS tablet   atorvastatin  (LIPITOR) 80 MG tablet   blood glucose meter kit and supplies KIT   Blood Glucose Monitoring Suppl Supplies MISC   Cholecalciferol (VITAMIN D ) 50 MCG (2000 UT) tablet   dapagliflozin  propanediol (FARXIGA ) 10 MG TABS tablet   ENTRESTO  49-51 MG   ezetimibe (ZETIA) 10 MG tablet   furosemide  (LASIX ) 80 MG tablet   metFORMIN (GLUCOPHAGE-XR) 500 MG 24 hr tablet   metoprolol  succinate (TOPROL -XL) 25 MG 24 hr  tablet   Misc. Devices (GNP DIGITAL WEIGHT SCALE) MISC   NON FORMULARY   OZEMPIC , 0.25 OR 0.5 MG/DOSE, 2 MG/3ML SOPN   potassium chloride  SA (KLOR-CON  M20) 20 MEQ tablet   WIXELA INHUB 100-50 MCG/ACT AEPB   No current facility-administered medications for this encounter.     Harlene Hoots Ward, PA-C [facility] Pre-Surgical Testing [phone]

## 2024-08-07 ENCOUNTER — Encounter (HOSPITAL_COMMUNITY)
Admission: RE | Disposition: A | Discharge status: Home / Self Care | Payer: Self-pay | Source: Home / Self Care | Attending: Urology

## 2024-08-07 ENCOUNTER — Ambulatory Visit (HOSPITAL_COMMUNITY): Payer: Self-pay | Admitting: Physician Assistant

## 2024-08-07 ENCOUNTER — Ambulatory Visit (HOSPITAL_COMMUNITY)
Admission: RE | Admit: 2024-08-07 | Discharge: 2024-08-07 | Disposition: A | Discharge status: Home / Self Care | Attending: Urology | Admitting: Urology

## 2024-08-07 ENCOUNTER — Ambulatory Visit (HOSPITAL_BASED_OUTPATIENT_CLINIC_OR_DEPARTMENT_OTHER): Admitting: Anesthesiology

## 2024-08-07 DIAGNOSIS — I130 Hypertensive heart and chronic kidney disease with heart failure and stage 1 through stage 4 chronic kidney disease, or unspecified chronic kidney disease: Secondary | ICD-10-CM | POA: Diagnosis not present

## 2024-08-07 DIAGNOSIS — N183 Chronic kidney disease, stage 3 unspecified: Secondary | ICD-10-CM

## 2024-08-07 DIAGNOSIS — G4730 Sleep apnea, unspecified: Secondary | ICD-10-CM | POA: Insufficient documentation

## 2024-08-07 DIAGNOSIS — F17210 Nicotine dependence, cigarettes, uncomplicated: Secondary | ICD-10-CM | POA: Insufficient documentation

## 2024-08-07 DIAGNOSIS — I110 Hypertensive heart disease with heart failure: Secondary | ICD-10-CM | POA: Insufficient documentation

## 2024-08-07 DIAGNOSIS — I4891 Unspecified atrial fibrillation: Secondary | ICD-10-CM | POA: Insufficient documentation

## 2024-08-07 DIAGNOSIS — I080 Rheumatic disorders of both mitral and aortic valves: Secondary | ICD-10-CM | POA: Diagnosis not present

## 2024-08-07 DIAGNOSIS — F17290 Nicotine dependence, other tobacco product, uncomplicated: Secondary | ICD-10-CM | POA: Insufficient documentation

## 2024-08-07 DIAGNOSIS — E119 Type 2 diabetes mellitus without complications: Secondary | ICD-10-CM | POA: Insufficient documentation

## 2024-08-07 DIAGNOSIS — I509 Heart failure, unspecified: Secondary | ICD-10-CM | POA: Diagnosis not present

## 2024-08-07 DIAGNOSIS — E1169 Type 2 diabetes mellitus with other specified complication: Secondary | ICD-10-CM

## 2024-08-07 DIAGNOSIS — N471 Phimosis: Secondary | ICD-10-CM | POA: Insufficient documentation

## 2024-08-07 DIAGNOSIS — Z7984 Long term (current) use of oral hypoglycemic drugs: Secondary | ICD-10-CM | POA: Insufficient documentation

## 2024-08-07 HISTORY — PX: CIRCUMCISION: SHX1350

## 2024-08-07 LAB — GLUCOSE, CAPILLARY
Glucose-Capillary: 95 mg/dL (ref 70–99)
Glucose-Capillary: 100 mg/dL — ABNORMAL HIGH (ref 70–99)
Glucose-Capillary: 113 mg/dL — ABNORMAL HIGH (ref 70–99)

## 2024-08-07 SURGERY — CIRCUMCISION, ADULT
Anesthesia: General | Site: Penis

## 2024-08-07 MED ORDER — METOPROLOL SUCCINATE ER 25 MG PO TB24
25.0000 mg | ORAL_TABLET | ORAL | Status: AC
  Administered 2024-08-07: 25 mg via ORAL
  Filled 2024-08-07: qty 1

## 2024-08-07 MED ORDER — MIDAZOLAM HCL 2 MG/2ML IJ SOLN
INTRAMUSCULAR | Status: AC
  Filled 2024-08-07: qty 2

## 2024-08-07 MED ORDER — LIDOCAINE HCL 1 % IJ SOLN
INTRAMUSCULAR | Status: AC
  Filled 2024-08-07: qty 20

## 2024-08-07 MED ORDER — 0.9 % SODIUM CHLORIDE (POUR BTL) OPTIME
TOPICAL | Status: DC | PRN
  Administered 2024-08-07: 1000 mL

## 2024-08-07 MED ORDER — LIDOCAINE HCL (PF) 2 % IJ SOLN
INTRAMUSCULAR | Status: AC
  Filled 2024-08-07: qty 5

## 2024-08-07 MED ORDER — ORAL CARE MOUTH RINSE: 15.0000 mL | Freq: Once | OROMUCOSAL | Status: AC

## 2024-08-07 MED ORDER — OXYCODONE HCL 5 MG PO TABS: 5.0000 mg | ORAL_TABLET | Freq: Once | ORAL | Status: DC | PRN

## 2024-08-07 MED ORDER — FENTANYL CITRATE (PF) 100 MCG/2ML IJ SOLN
INTRAMUSCULAR | Status: AC
  Filled 2024-08-07: qty 2

## 2024-08-07 MED ORDER — KETOROLAC TROMETHAMINE 30 MG/ML IJ SOLN
INTRAMUSCULAR | Status: AC
Start: 2024-08-07 — End: 2024-08-07
  Filled 2024-08-07: qty 1

## 2024-08-07 MED ORDER — ONDANSETRON HCL 4 MG/2ML IJ SOLN
INTRAMUSCULAR | Status: AC
Start: 2024-08-07 — End: 2024-08-07
  Filled 2024-08-07: qty 2

## 2024-08-07 MED ORDER — PHENYLEPHRINE HCL-NACL 20-0.9 MG/250ML-% IV SOLN
INTRAVENOUS | Status: DC | PRN
  Administered 2024-08-07: 50 ug/min via INTRAVENOUS

## 2024-08-07 MED ORDER — PROPOFOL 10 MG/ML IV BOLUS
INTRAVENOUS | Status: AC
  Filled 2024-08-07: qty 20

## 2024-08-07 MED ORDER — ONDANSETRON HCL 4 MG/2ML IJ SOLN: 4.0000 mg | Freq: Once | INTRAMUSCULAR | Status: DC | PRN

## 2024-08-07 MED ORDER — INSULIN ASPART 100 UNIT/ML IJ SOLN: 0.0000 [IU] | Freq: Three times a day (TID) | INTRAMUSCULAR | Status: DC

## 2024-08-07 MED ORDER — PROPOFOL 10 MG/ML IV BOLUS
INTRAVENOUS | Status: DC | PRN
  Administered 2024-08-07: 100 mg via INTRAVENOUS
  Administered 2024-08-07 (×2): 50 mg via INTRAVENOUS
  Administered 2024-08-07: 20 mg via INTRAVENOUS
  Administered 2024-08-07: 50 mg via INTRAVENOUS

## 2024-08-07 MED ORDER — MIDAZOLAM HCL (PF) 2 MG/2ML IJ SOLN
INTRAMUSCULAR | Status: DC | PRN
  Administered 2024-08-07 (×2): 1 mg via INTRAVENOUS

## 2024-08-07 MED ORDER — PHENYLEPHRINE 80 MCG/ML (10ML) SYRINGE FOR IV PUSH (FOR BLOOD PRESSURE SUPPORT)
PREFILLED_SYRINGE | INTRAVENOUS | Status: AC
  Filled 2024-08-07: qty 10

## 2024-08-07 MED ORDER — DROPERIDOL 2.5 MG/ML IJ SOLN: 0.6250 mg | Freq: Once | INTRAMUSCULAR | Status: DC | PRN

## 2024-08-07 MED ORDER — DEXAMETHASONE SOD PHOSPHATE PF 10 MG/ML IJ SOLN
INTRAMUSCULAR | Status: DC | PRN
  Administered 2024-08-07: 10 mg via INTRAVENOUS

## 2024-08-07 MED ORDER — BUPIVACAINE HCL (PF) 0.25 % IJ SOLN
INTRAMUSCULAR | Status: AC
  Filled 2024-08-07: qty 30

## 2024-08-07 MED ORDER — CEFAZOLIN SODIUM-DEXTROSE 2-4 GM/100ML-% IV SOLN
2.0000 g | INTRAVENOUS | Status: AC
  Administered 2024-08-07: 2 g via INTRAVENOUS
  Filled 2024-08-07: qty 100

## 2024-08-07 MED ORDER — FENTANYL CITRATE (PF) 100 MCG/2ML IJ SOLN
INTRAMUSCULAR | Status: DC | PRN
  Administered 2024-08-07 (×2): 25 ug via INTRAVENOUS

## 2024-08-07 MED ORDER — ACETAMINOPHEN 10 MG/ML IV SOLN: 1000.0000 mg | Freq: Once | INTRAVENOUS | Status: DC | PRN

## 2024-08-07 MED ORDER — LACTATED RINGERS IV SOLN: INTRAVENOUS | Status: DC

## 2024-08-07 MED ORDER — CELECOXIB 200 MG PO CAPS: 200.0000 mg | ORAL_CAPSULE | Freq: Two times a day (BID) | ORAL | 0 refills | Status: AC

## 2024-08-07 MED ORDER — LIDOCAINE HCL (CARDIAC) PF 100 MG/5ML IV SOSY
PREFILLED_SYRINGE | INTRAVENOUS | Status: DC | PRN
  Administered 2024-08-07: 100 mg via INTRAVENOUS

## 2024-08-07 MED ORDER — FENTANYL CITRATE (PF) 50 MCG/ML IJ SOSY: 25.0000 ug | PREFILLED_SYRINGE | INTRAMUSCULAR | Status: DC | PRN

## 2024-08-07 MED ORDER — INSULIN ASPART 100 UNIT/ML IJ SOLN: 0.0000 [IU] | INTRAMUSCULAR | Status: DC | PRN

## 2024-08-07 MED ORDER — PHENYLEPHRINE 80 MCG/ML (10ML) SYRINGE FOR IV PUSH (FOR BLOOD PRESSURE SUPPORT)
PREFILLED_SYRINGE | INTRAVENOUS | Status: DC | PRN
  Administered 2024-08-07 (×2): 160 ug via INTRAVENOUS
  Administered 2024-08-07: 80 ug via INTRAVENOUS

## 2024-08-07 MED ORDER — TRAMADOL HCL 50 MG PO TABS: 50.0000 mg | ORAL_TABLET | Freq: Four times a day (QID) | ORAL | 0 refills | Status: AC | PRN

## 2024-08-07 MED ORDER — ONDANSETRON HCL 4 MG/2ML IJ SOLN
INTRAMUSCULAR | Status: DC | PRN
  Administered 2024-08-07: 4 mg via INTRAVENOUS

## 2024-08-07 MED ORDER — OXYCODONE HCL 5 MG/5ML PO SOLN: 5.0000 mg | Freq: Once | ORAL | Status: DC | PRN

## 2024-08-07 MED ORDER — LIDOCAINE HCL 1 % IJ SOLN
INTRAMUSCULAR | Status: DC | PRN
  Administered 2024-08-07: 24 mL

## 2024-08-07 MED ORDER — CHLORHEXIDINE GLUCONATE 0.12 % MT SOLN
15.0000 mL | Freq: Once | OROMUCOSAL | Status: AC
  Administered 2024-08-07: 15 mL via OROMUCOSAL

## 2024-08-07 SURGICAL SUPPLY — 24 items
BAG COUNTER SPONGE SURGICOUNT (BAG) IMPLANT
BLADE SURG 15 STRL LF DISP TIS (BLADE) ×1 IMPLANT
BNDG COHESIVE 2X5 TAN ST LF (GAUZE/BANDAGES/DRESSINGS) ×1 IMPLANT
COVER SURGICAL LIGHT HANDLE (MISCELLANEOUS) ×1 IMPLANT
DRAPE LAPAROTOMY T 98X78 PEDS (DRAPES) ×1 IMPLANT
DRSG XEROFORM 1X8 (GAUZE/BANDAGES/DRESSINGS) ×1 IMPLANT
ELECT NDL TIP 2.8 STRL (NEEDLE) ×1 IMPLANT
ELECT NEEDLE TIP 2.8 STRL (NEEDLE) ×1 IMPLANT
ELECT PENCIL ROCKER SW 15FT (MISCELLANEOUS) ×1 IMPLANT
ELECT REM PT RETURN 15FT ADLT (MISCELLANEOUS) ×1 IMPLANT
GAUZE SPONGE 4X4 12PLY STRL LF (GAUZE/BANDAGES/DRESSINGS) ×1 IMPLANT
GAUZE STRETCH 2X75IN STRL (MISCELLANEOUS) ×1 IMPLANT
GLOVE SS BIOGEL STRL SZ 7 (GLOVE) ×1 IMPLANT
GLOVE SURG LX STRL 7.5 STRW (GLOVE) ×1 IMPLANT
GOWN STRL REUS W/ TWL XL LVL3 (GOWN DISPOSABLE) ×1 IMPLANT
KIT BASIN OR (CUSTOM PROCEDURE TRAY) ×1 IMPLANT
KIT TURNOVER KIT A (KITS) ×1 IMPLANT
PACK BASIC VI WITH GOWN DISP (CUSTOM PROCEDURE TRAY) ×1 IMPLANT
SUT CHROMIC 3 0 SH 27 (SUTURE) ×1 IMPLANT
SUT CHROMIC 4 0 RB 1X27 (SUTURE) IMPLANT
SYR CONTROL 10ML LL (SYRINGE) ×1 IMPLANT
TOWEL OR 17X26 10 PK STRL BLUE (TOWEL DISPOSABLE) IMPLANT
WATER STERILE IRR 1000ML POUR (IV SOLUTION) IMPLANT
YANKAUER SUCT BULB TIP 10FT TU (MISCELLANEOUS) IMPLANT

## 2024-08-07 NOTE — Anesthesia Postprocedure Evaluation (Signed)
 Anesthesia Post Note  Patient: Christopher Wilkins.  Procedure(s) Performed: CIRCUMCISION, ADULT (Penis)     Patient location during evaluation: PACU Anesthesia Type: General Level of consciousness: awake Pain management: pain level controlled Vital Signs Assessment: post-procedure vital signs reviewed and stable Respiratory status: spontaneous breathing Cardiovascular status: blood pressure returned to baseline Postop Assessment: no apparent nausea or vomiting Anesthetic complications: no   No notable events documented.  Last Vitals:  Vitals:   08/07/24 1415 08/07/24 1430  BP:  122/77  Pulse: 69   Resp: 20 20  Temp: 36.4 C 36.4 C  SpO2: 100% 100%    Last Pain:  Vitals:   08/07/24 1430  TempSrc:   PainSc: 0-No pain                 Lauraine KATHEE [place]

## 2024-08-07 NOTE — H&P (Signed)
 H&P  History of Present Illness: Christopher Wilkins. is a 68 y.o. year old M who presents today for circumcision  Past Medical History:  Diagnosis Date   Atrial fibrillation (HCC)    CHF (congestive heart failure) (HCC)    Diabetes (HCC)    Dyspnea    Elevated LFTs    Fatty liver    GERD (gastroesophageal reflux disease)    Hypertension    Insomnia    Pneumonia    Prediabetes    Sleep apnea    SOB (shortness of breath)    Tubular adenoma of colon    Vitamin D  deficiency     Past Surgical History:  Procedure Laterality Date   ATRIAL FIBRILLATION ABLATION N/A 06/23/2022   Procedure: ATRIAL FIBRILLATION ABLATION;  Surgeon: Inocencio Soyla Lunger, MD;  [facility]AS[facility] CV LAB;  Service: Cardiovascular;  Laterality: N/A;   CARDIOVERSION N/A 06/20/2020   Procedure: CARDIOVERSION;  Surgeon: Jeffrie Oneil BROCKS, MD;  Location: [facility] ENDOSCOPY;  Service: Cardiovascular;  Laterality: N/A;   CARDIOVERSION N/A 08/20/2020   Procedure: CARDIOVERSION;  Surgeon: Cherrie Toribio SAUNDERS, MD;  Location: [facility] ENDOSCOPY;  Service: Cardiovascular;  Laterality: N/A;   CHOLECYSTECTOMY     COLONOSCOPY     OTHER SURGICAL HISTORY     Shot a couple of times   RIGHT/LEFT HEART CATH AND CORONARY ANGIOGRAPHY N/A 08/18/2020   Procedure: RIGHT/LEFT HEART CATH AND CORONARY ANGIOGRAPHY;  Surgeon: Cherrie Toribio SAUNDERS, MD;  [facility] LAB;  Service: Cardiovascular;  Laterality: N/A;   TEE WITHOUT CARDIOVERSION N/A 06/20/2020   Procedure: TRANSESOPHAGEAL ECHOCARDIOGRAM (TEE);  Surgeon: Jeffrie Oneil BROCKS, MD;  Location: [facility] ENDOSCOPY;  Service: Cardiovascular;  Laterality: N/A;   TEE WITHOUT CARDIOVERSION N/A 08/20/2020   Procedure: TRANSESOPHAGEAL ECHOCARDIOGRAM (TEE);  Surgeon: Cherrie Toribio SAUNDERS, MD;  Location: [facility] ENDOSCOPY;  Service: Cardiovascular;  Laterality: N/A;    Home Medications:  Current Meds  Medication Sig   albuterol  (VENTOLIN  HFA) 108 (90 Base) MCG/ACT inhaler Inhale 1-2 puffs into the lungs  every 8 (eight) hours as needed for wheezing or shortness of breath.   apixaban  (ELIQUIS ) 5 MG TABS tablet TAKE 1 TABLET (5 MG TOTAL) BY MOUTH 2 (TWO) TIMES DAILY.   atorvastatin  (LIPITOR) 80 MG tablet Take 80 mg by mouth every evening.   Cholecalciferol (VITAMIN D ) 50 MCG (2000 UT) tablet Take 2,000 Units by mouth daily.   dapagliflozin  propanediol (FARXIGA ) 10 MG TABS tablet Take 10 mg by mouth daily.   ENTRESTO  49-51 MG Take 1 tablet by mouth 2 (two) times daily.   ezetimibe (ZETIA) 10 MG tablet Take 10 mg by mouth daily.   furosemide  (LASIX ) 80 MG tablet Take 80 mg by mouth daily.   metFORMIN (GLUCOPHAGE-XR) 500 MG 24 hr tablet Take 500 mg by mouth daily.   metoprolol  succinate (TOPROL -XL) 25 MG 24 hr tablet Take 25 mg by mouth daily.   NON FORMULARY Pt uses a cpap nightly   potassium chloride  SA (KLOR-CON  M20) 20 MEQ tablet TAKE 1 TABLET BY MOUTH DAILY. PLEASE CONTACT THE OFFICE TO SCHEDULE A FOLLOW UP APPT 1ST ATTEMPT   WIXELA INHUB 100-50 MCG/ACT AEPB Inhale 1 puff into the lungs 2 (two) times daily.    Allergies:  Allergies  Allergen Reactions   Codeine Nausea And Vomiting   Lisinopril  Other (See Comments)    Didn't do well in my body    Family History  Problem Relation Age of Onset   Cancer Mother    Obesity Mother  High blood pressure Father    High Cholesterol Father    Colon cancer Neg Hx    Depression Neg Hx     Social History:  reports that he has been smoking cigarettes and cigars. He has never used smokeless tobacco. He reports that he does not currently use alcohol. He reports that he does not currently use drugs.  ROS: A complete review of systems was performed.  All systems are negative except for pertinent findings as noted.  Physical Exam:  Vital signs in last 24 hours: Temp:  [97.7 F (36.5 C)] 97.7 F (36.5 C) (10/28 0900) Pulse Rate:  [67] 67 (10/28 0900) Resp:  [14] 14 (10/28 0900) BP: (152)/(88) 152/88 (10/28 0900) SpO2:  [96 %] 96 % (10/28  0900) Constitutional:  Alert and oriented, No acute distress Cardiovascular: Regular rate and rhythm Respiratory: Normal respiratory effort, Lungs clear bilaterally GI: Abdomen is soft, nontender, nondistended, no abdominal masses Lymphatic: No lymphadenopathy Neurologic: Grossly intact, no focal deficits Psychiatric: Normal mood and affect   Laboratory Data:  No results for input(s): WBC, HGB, HCT, PLT in the last 72 hours.  No results for input(s): NA, K, CL, GLUCOSE, BUN, CALCIUM , CREATININE in the last 72 hours.  Invalid input(s): CO3   Results for orders placed or performed during the hospital encounter of 08/07/24 (from the past 24 hours)  Glucose, capillary     Status: None   Collection Time: 08/07/24  9:39 AM  Result Value Ref Range   Glucose-Capillary 95 70 - 99 mg/dL   No results found for this or any previous visit (from the past 240 hours).  Renal Function: Recent Labs    08/01/24 0846  CREATININE 1.05   Estimated Creatinine Clearance: 76.4 mL/min (by C-G formula based on SCr of 1.05 mg/dL).  Radiologic Imaging: No results found.  Assessment:  Christopher Wilkins. is a 68 y.o. year old M here today for circumcision  Plan:  --to OR as planned; procedure and risks reviewed (bleeding, infection, wound issues, pain, cosmetic issues, removal of too much or too little skin)  Herlene Foot, MD 08/07/2024, 11:56 AM  [facility] Pager: [phone]

## 2024-08-07 NOTE — Discharge Instructions (Addendum)
 Postoperative instructions for circumcision  Wound:  Remove the dressing the morning after surgery. In most cases your incision will have absorbable sutures that run along the course of your incision and will dissolve within the first 10-20 days. Some will fall out even earlier. Expect some redness as the sutures dissolved but this should occur only around the sutures. If there is generalized redness, especially with increasing pain or swelling, let us  know. The penis will possibly get black and blue as the blood in the tissues spread. Sometimes the whole penis will turn colors. The black and blue is followed by a yellow and brown color. In time, all the discoloration will go away.  Diet:  You may return to your normal diet within 24 hours following your surgery. You may note some mild nausea and possibly vomiting the first 6-8 hours following surgery. This is usually due to the side effects of anesthesia, and will disappear quite soon. I would suggest clear liquids and a very light meal the first evening following your surgery.  Activity:  Your physical activity should be restricted the first 48 hours. During that time you should remain relatively inactive, moving about only when necessary. During the first 2 weeks following surgery he should avoid lifting any heavy objects (anything greater than 15 pounds), and avoid strenuous exercise. If you work, ask us  specifically about your restrictions, both for work and home. We will write a note to your employer if needed.   Do not use your penis for 3 weeks or until the incisions are completely healed, whichever comes later.  Ice packs can be placed on and off over the penis for the first 48 hours to help relieve the pain and keep the swelling down. Frozen peas or corn in a ZipLock bag can be frozen, used and re-frozen. Fifteen minutes on and 15 minutes off is a reasonable schedule.  No sexual activity for 1 month.  Hygiene:  You may shower 24  hours after your surgery. Make sure wound is clean and dry afterwards. Tub bathing should be restricted until the seventh day.  Medication:  You will be sent home with some type of pain medication. In many cases you will be sent home with a narcotic pain pill (Vicodin or Tylox). If the pain is not too bad, you may take either Tylenol  (acetaminophen ) or Advil (ibuprofen) which contain no narcotic agents, and might be tolerated a little better, with fewer side effects. If the pain medication you are sent home with does not control the pain, you will have to let us  know. Some narcotic pain medications cannot be given or refilled by a phone call to a pharmacy.  Problems you should report to us :  Fever of 101.0 degrees Fahrenheit or greater. Moderate or severe swelling under the skin incision or involving the scrotum. Drug reaction such as hives, a rash, nausea or vomiting.  Difficulty voiding  Post Anesthesia Home Care Instructions  Activity: Get plenty of rest for the remainder of the day. A responsible adult should stay with you for 24 hours following the procedure.  For the next 24 hours, DO NOT: -Drive a car -Advertising copywriter -Drink alcoholic beverages -Take any medication unless instructed by your physician -Make any legal decisions or sign important papers.  Meals: Start with liquid foods such as gelatin or soup. Progress to regular foods as tolerated. Avoid greasy, spicy, heavy foods. If nausea and/or vomiting occur, drink only clear liquids until the nausea and/or vomiting subsides. Call your  physician if vomiting continues.  Special Instructions/Symptoms: Your throat may feel dry or sore from the anesthesia or the breathing tube placed in your throat during surgery. If this causes discomfort, gargle with warm salt water. The discomfort should disappear within 24 hours.  If you had a scopolamine patch placed behind your ear for the management of post- operative nausea and/or  vomiting:  1. The medication in the patch is effective for 72 hours, after which it should be removed.  Wrap patch in a tissue and discard in the trash. Wash hands thoroughly with soap and water. 2. You may remove the patch earlier than 72 hours if you experience unpleasant side effects which may include dry mouth, dizziness or visual disturbances. 3. Avoid touching the patch. Wash your hands with soap and water after contact with the patch.

## 2024-08-07 NOTE — Progress Notes (Addendum)
 Friend Corinne Jester is driver, no health info should be given to him.  Pt sister Shanda Paras will help care for him and he gives permission to update her.  She is available until 330am when she goes to work.  Cpap and hose in bag.

## 2024-08-07 NOTE — Transfer of Care (Signed)
 Immediate Anesthesia Transfer of Care Note  Patient: Christopher Wilkins.  Procedure(s) Performed: CIRCUMCISION, ADULT (Penis)  Patient Location: PACU  Anesthesia Type:General  Level of Consciousness: awake and alert   Airway & Oxygen Therapy: Patient Spontanous Breathing and Patient connected to face mask oxygen  Post-op Assessment: Report given to RN and Post -op Vital signs reviewed and stable  Post vital signs: Reviewed and stable  Last Vitals:  Vitals Value Taken Time  BP 133/67 08/07/24 13:45  Temp    Pulse 69 08/07/24 13:45  Resp 29 08/07/24 13:45  SpO2 100 % 08/07/24 13:45  Vitals shown include unfiled device data.  Last Pain:  Vitals:   08/07/24 0900  TempSrc: Oral  PainSc: 0-No pain         Complications: No notable events documented.

## 2024-08-07 NOTE — Anesthesia Procedure Notes (Signed)
 Procedure Name: LMA Insertion Date/Time: 08/07/2024 12:45 PM  Performed by: Therisa Doyal CROME, CRNAPatient Re-evaluated:Patient Re-evaluated prior to induction Oxygen Delivery Method: Circle system utilized Preoxygenation: Pre-oxygenation with 100% oxygen Induction Type: IV induction LMA: LMA inserted LMA Size: 5.0 Number of attempts: 2 Placement Confirmation: positive ETCO2 and breath sounds checked- equal and bilateral Tube secured with: Tape Dental Injury: Teeth and Oropharynx as per pre-operative assessment

## 2024-08-07 NOTE — Op Note (Addendum)
 Pre-operative diagnosis: Phimosis  Postoperative diagnosis: same  Procedure:  Circumcision Penile block  Surgeon: Herlene Foot. M.D.  Assistant: Maurilio Agar MD  Anesthesia: General  Complications: None  EBL: Minimal  Specimens: None  Indication: Christopher Wilkins. is a 68 y.o. male who was diagnosed with phimosis.  He elected to proceed with circumcision for treatment. The potential risks, complications, alternative options, and expected recovery process was discussed in detail and informed consent was obtained.  Description of procedure: The patient was taken to the operating room and a general anesthetic was administered. He was given preoperative antibiotics, placed in the supine position, and his genitalia was prepped and draped in the usual sterile fashion. Next, a preoperative timeout was performed.  A dorsal and circumferential penile block was done using a 50-50 mixture of 0.25 bupivacaine and 1% lidocaine .   The penis was examined and appropriate incision sites were marked circumferentially proximal and distally. These incision sites were then incised with the knife circumferentially allowing the skin to be separated distally and approximately and isolating the foreskin. Hemostats were then placed in the dorsal aspect of the foreskin and Metzenbaum scissors were used to create an opening connecting the proximal and distal incision sites. The remainder of the foreskin was then excised utilizing cautery dissection as necessary. Once the foreskin was removed, the underlying penile tissue and dartos fascia was examined and hemostasis was achieved with cautery as necessary. The proximal and distal incision sites were then reapproximated utilizing 3-0 chromic suture. 4 interrupted sutures were placed dorsally, ventrally, and on either side. The intervening tissue between each quadrant was then reapproximated with running 3-0 chromic suture.  The frenulum was reconstructed with 3 simple  interrupted 4-0 sutures.   A gauze dressing was then placed and secured with Coband.   The patient tolerated the procedure well and without complications. He was able to be awakened and transferred to the recovery unit in satisfactory condition.  Herlene Foot MD 08/07/2024, 1:41 PM  [facility]  Pager: [phone]

## 2024-08-08 ENCOUNTER — Telehealth: Payer: Self-pay

## 2024-08-08 ENCOUNTER — Encounter (HOSPITAL_COMMUNITY): Payer: Self-pay | Admitting: Urology

## 2024-08-08 NOTE — Telephone Encounter (Signed)
 Patient's urologist for procedure was Dr. Arlyss Foot at [facility]. Ph# [phone]. Patient was provided with the above information.  Copied from CRM [id number]. Topic: Clinical - Medical Advice >> Aug 08, 2024  9:46 AM Farrel B wrote: Reason for CRM: Patient [name]. Called from [phone] stated that he had a circumcision on yesterday, he has the bandages on site and stated his discharge papers does not show how long it has to stay bandage. I attempted to assist him by calling the [place] [facility] but it kept this number is no longer in service Please call patient to advise

## 2024-08-13 ENCOUNTER — Ambulatory Visit: Attending: Nurse Practitioner | Admitting: Nurse Practitioner

## 2024-08-13 ENCOUNTER — Encounter: Payer: Self-pay | Admitting: Nurse Practitioner

## 2024-08-13 VITALS — BP 128/86 | HR 73 | Ht 66.0 in | Wt 229.0 lb

## 2024-08-13 DIAGNOSIS — I350 Nonrheumatic aortic (valve) stenosis: Secondary | ICD-10-CM

## 2024-08-13 DIAGNOSIS — I10 Essential (primary) hypertension: Secondary | ICD-10-CM

## 2024-08-13 DIAGNOSIS — G4733 Obstructive sleep apnea (adult) (pediatric): Secondary | ICD-10-CM

## 2024-08-13 DIAGNOSIS — I480 Paroxysmal atrial fibrillation: Secondary | ICD-10-CM

## 2024-08-13 DIAGNOSIS — I5020 Unspecified systolic (congestive) heart failure: Secondary | ICD-10-CM | POA: Diagnosis not present

## 2024-08-13 DIAGNOSIS — E782 Mixed hyperlipidemia: Secondary | ICD-10-CM

## 2024-08-13 DIAGNOSIS — Z720 Tobacco use: Secondary | ICD-10-CM

## 2024-08-13 NOTE — Patient Instructions (Signed)
 Medication Instructions:  Your physician recommends that you continue on your current medications as directed. Please refer to the Current Medication list given to you today.  *If you need a refill on your cardiac medications before your next appointment, please call your pharmacy*  Lab Work: NONE ordered at this time of appointment   Testing/Procedures: NONE ordered at this time of appointment   Follow-Up: At [facility], you and your health needs are our priority.  As part of our continuing mission to provide you with exceptional heart care, our providers are all part of one team.  This team includes your primary Cardiologist (physician) and Advanced Practice Providers or APPs (Physician Assistants and Nurse Practitioners) who all work together to provide you with the care you need, when you need it.  Your next appointment:   1 month Dr. Shlomo for sleep 6 months Dr. Kate    We recommend signing up for the patient portal called MyChart.  Sign up information is provided on this After Visit Summary.  MyChart is used to connect with patients for Virtual Visits (Telemedicine).  Patients are able to view lab/test results, encounter notes, upcoming appointments, etc.  Non-urgent messages can be sent to your provider as well.   To learn more about what you can do with MyChart, go to [email or web address].   Other Instructions

## 2024-08-13 NOTE — Progress Notes (Signed)
 Office Visit    Patient Name: Christopher Wilkins. Date of Encounter: 08/13/2024  Primary Care Provider:  Leontine Cramp, NP Primary Cardiologist:  Lonni LITTIE Nanas, MD  Chief Complaint    68 year old male with a history of heart failure with recovered EF, paroxysmal atrial fibrillation, moderate aortic stenosis, hypertension, hyperlipidemia, transaminitis in the setting of fatty liver disease, OSA, obesity, and tobacco use who presents for follow-up related to heart failure and atrial fibrillation.  Past Medical History    Past Medical History:  Diagnosis Date   Atrial fibrillation (HCC)    CHF (congestive heart failure) (HCC)    Diabetes (HCC)    Dyspnea    Elevated LFTs    Fatty liver    GERD (gastroesophageal reflux disease)    Hypertension    Insomnia    Pneumonia    Prediabetes    Sleep apnea    SOB (shortness of breath)    Tubular adenoma of colon    Vitamin D  deficiency    Past Surgical History:  Procedure Laterality Date   ATRIAL FIBRILLATION ABLATION N/A 06/23/2022   Procedure: ATRIAL FIBRILLATION ABLATION;  Surgeon: Inocencio Soyla Lunger, MD;  Location: [facility] INVASIVE CV LAB;  Service: Cardiovascular;  Laterality: N/A;   CARDIOVERSION N/A 06/20/2020   Procedure: CARDIOVERSION;  Surgeon: Jeffrie Oneil BROCKS, MD;  Location: [facility] ENDOSCOPY;  Service: Cardiovascular;  Laterality: N/A;   CARDIOVERSION N/A 08/20/2020   Procedure: CARDIOVERSION;  Surgeon: Cherrie Toribio SAUNDERS, MD;  Location: [facility] ENDOSCOPY;  Service: Cardiovascular;  Laterality: N/A;   CHOLECYSTECTOMY     CIRCUMCISION N/A 08/07/2024   Procedure: CIRCUMCISION, ADULT;  Surgeon: Lovie Arlyss LITTIE, MD;  Location: [facility] ORS;  Service: Urology;  Laterality: N/A;   COLONOSCOPY     OTHER SURGICAL HISTORY     Shot a couple of times   RIGHT/LEFT HEART CATH AND CORONARY ANGIOGRAPHY N/A 08/18/2020   Procedure: RIGHT/LEFT HEART CATH AND CORONARY ANGIOGRAPHY;  Surgeon: Cherrie Toribio SAUNDERS, MD;  Location: [facility] INVASIVE CV LAB;   Service: Cardiovascular;  Laterality: N/A;   TEE WITHOUT CARDIOVERSION N/A 06/20/2020   Procedure: TRANSESOPHAGEAL ECHOCARDIOGRAM (TEE);  Surgeon: Jeffrie Oneil BROCKS, MD;  Location: [facility] ENDOSCOPY;  Service: Cardiovascular;  Laterality: N/A;   TEE WITHOUT CARDIOVERSION N/A 08/20/2020   Procedure: TRANSESOPHAGEAL ECHOCARDIOGRAM (TEE);  Surgeon: Cherrie Toribio SAUNDERS, MD;  Location: [facility] ENDOSCOPY;  Service: Cardiovascular;  Laterality: N/A;    Allergies  Allergies  Allergen Reactions   Codeine Nausea And Vomiting   Lisinopril  Other (See Comments)    Didn't do well in my body     Labs/Other Studies Reviewed    The following studies were reviewed today:  Cardiac Studies & Procedures   ______________________________________________________________________________________________ CARDIAC CATHETERIZATION  CARDIAC CATHETERIZATION 08/18/2020  Conclusion Findings:  On milrinone  0.125 mcg/kg/min  Ao = 94/63 (76) LV = 101/18 RA = 9 RV = 38/11 PA = 37/18 (24) PCW =16 Fick cardiac output/index = 5.5/2.8 PVR = 1.5 WU FA sat = 95% PA sat = 71%, 72%  Assessment: 1. Normal coronary arteries 2. Severe NICM EF 15-20% (suspect AF related) 3. Well compensated hemodynamics on milrinone  4. 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 Switch to apixaban  later today. TEE/DC-CV on Wednesday am.  Toribio Cherrie, MD 11:07 AM  Findings Coronary Findings Diagnostic  Dominance: Right  Left Main Vessel is angiographically normal.  Left Anterior Descending Vessel is angiographically normal.  Left Circumflex Vessel is angiographically normal.  Right Coronary Artery Vessel is angiographically normal.  Intervention  No interventions have been documented.     ECHOCARDIOGRAM  ECHOCARDIOGRAM COMPLETE 07/17/2024  Narrative ECHOCARDIOGRAM REPORT    Patient Name:   Christopher Wilkins. Date of Exam: 07/17/2024 Medical Rec #:  [id number]       Height:       68.0  in Accession #:    [id number]      Weight:       229.0 lb Date of Birth:  08/20/1956       BSA:          2.165 m Patient Age:    68 years        BP:           106/70 mmHg Patient Gender: M               HR:           78 bpm. Exam Location:  [address]  Procedure: 2D Echo, Cardiac Doppler, Color Doppler, 3D Echo and Strain Analysis (Both Spectral and Color Flow Doppler were utilized during procedure).  Indications:    Aortic Stenosis I35.0; Atrial Fibrillation I48.91  History:        Patient has prior history of Echocardiogram examinations, most recent 08/04/2023. CHF, Atrial Fibrillation Ablation; Risk Factors:Hypertension.  Sonographer:    Augustin Seals RDCS Referring Phys: [id number] CHRISTOPHER L SCHUMANN  IMPRESSIONS   1. Left ventricular ejection fraction, by estimation, is 55 to 60%. Left ventricular ejection fraction by 3D volume is 57 %. The left ventricle has normal function. The left ventricle has no regional wall motion abnormalities. There is mild left ventricular hypertrophy. Left ventricular diastolic parameters are consistent with Grade I diastolic dysfunction (impaired relaxation). The average left ventricular global longitudinal strain is -18.9 %. The global longitudinal strain is normal. 2. Right ventricular systolic function is low normal. The right ventricular size is normal. Tricuspid regurgitation signal is inadequate for assessing PA pressure. 3. Calcified chords. The mitral valve is degenerative. Mild to moderate mitral valve regurgitation. The mean mitral valve gradient is 2.7 mmHg. Severe mitral annular calcification. 4. The aortic valve is tricuspid. There is moderate calcification of the aortic valve. There is moderate thickening of the aortic valve. Aortic valve regurgitation is trivial. Mild to moderate aortic valve stenosis. Aortic valve mean gradient measures 17.0 mmHg. Aortic valve Vmax measures 2.73 m/s. 5. The inferior vena cava is normal in size  with <50% respiratory variability, suggesting right atrial pressure of 8 mmHg.  Comparison(s): Prior images reviewed side by side. Mild increase in aortic valve gradients with stable to improved DVI; decreased stroke volume index in both studies.  FINDINGS Left Ventricle: Left ventricular ejection fraction, by estimation, is 55 to 60%. Left ventricular ejection fraction by 3D volume is 57 %. The left ventricle has normal function. The left ventricle has no regional wall motion abnormalities. The average left ventricular global longitudinal strain is -18.9 %. Strain was performed and the global longitudinal strain is normal. The left ventricular internal cavity size was normal in size. There is mild left ventricular hypertrophy. Left ventricular diastolic parameters are consistent with Grade I diastolic dysfunction (impaired relaxation).  Right Ventricle: The right ventricular size is normal. No increase in right ventricular wall thickness. Right ventricular systolic function is low normal. Tricuspid regurgitation signal is inadequate for assessing PA pressure.  Left Atrium: Left atrial size was normal in size.  Right Atrium: Right atrial size was normal in size.  Pericardium: There is no evidence of pericardial effusion.  Mitral Valve: Calcified chords. The mitral valve is degenerative in appearance. Severe mitral annular calcification. Mild to moderate mitral valve regurgitation. The mean mitral valve gradient is 2.7 mmHg.  Tricuspid Valve: The tricuspid valve is normal in structure. Tricuspid valve regurgitation is not demonstrated. No evidence of tricuspid stenosis.  Aortic Valve: Nominal decrase in stroke volume index; DVI 0.36. Valve is visually calcified with 2D planimetered area of 1.09 cm2. The aortic valve is tricuspid. There is moderate calcification of the aortic valve. There is moderate thickening of the aortic valve. There is mild aortic valve annular calcification. Aortic valve  regurgitation is trivial. Mild to moderate aortic stenosis is present. Aortic valve mean gradient measures 17.0 mmHg. Aortic valve peak gradient measures 29.8 mmHg. Aortic valve area, by VTI measures 1.36 cm.  Pulmonic Valve: The pulmonic valve was not well visualized. Pulmonic valve regurgitation is not visualized.  Aorta: The aortic root and ascending aorta are structurally normal, with no evidence of dilitation.  Venous: The inferior vena cava is normal in size with less than 50% respiratory variability, suggesting right atrial pressure of 8 mmHg.  IAS/Shunts: The atrial septum is grossly normal.  Additional Comments: 3D was performed not requiring image post processing on an independent workstation and was normal.   LEFT VENTRICLE PLAX 2D LVIDd:         5.40 cm         Diastology LVIDs:         3.60 cm         LV e' medial:    5.63 cm/s LV PW:         1.10 cm         LV E/e' medial:  17.8 LV IVS:        1.10 cm         LV e' lateral:   6.68 cm/s LVOT diam:     2.20 cm         LV E/e' lateral: 15.0 LV SV:         75 LV SV Index:   34              2D Longitudinal LVOT Area:     3.80 cm        Strain 2D Strain GLS   -18.0 % (A4C): 2D Strain GLS   -19.9 % (A3C): 2D Strain GLS   -18.7 % (A2C): 2D Strain GLS   -18.9 % Avg:  3D Volume EF LV 3D EF:    Left ventricul ar ejection fraction by 3D volume is 57 %.  3D Volume EF: 3D EF:        57 % LV EDV:       155 ml LV ESV:       67 ml LV SV:        88 ml  RIGHT VENTRICLE            IVC RV Basal diam:  3.10 cm    IVC diam: 1.80 cm RV Mid diam:    2.60 cm RV S prime:     6.83 cm/s  PULMONARY VEINS TAPSE (M-mode): 1.6 cm     A Reversal Velocity: 26.00 cm/s Diastolic Velocity:  40.60 cm/s S/D Velocity:        1.10 Systolic Velocity:   45.20 cm/s  LEFT ATRIUM             Index        RIGHT ATRIUM  Index LA diam:        4.30 cm 1.99 cm/m   RA Area:     12.70 cm LA Vol (A2C):   66.2 ml 30.58 ml/m  RA  Volume:   27.40 ml  12.66 ml/m LA Vol (A4C):   53.3 ml 24.62 ml/m LA Biplane Vol: 61.8 ml 28.55 ml/m AORTIC VALVE AV Area (Vmax):    1.43 cm AV Area (Vmean):   1.39 cm AV Area (VTI):     1.36 cm AV Vmax:           273.00 cm/s AV Vmean:          185.750 cm/s AV VTI:            0.549 m AV Peak Grad:      29.8 mmHg AV Mean Grad:      17.0 mmHg LVOT Vmax:         103.00 cm/s LVOT Vmean:        67.900 cm/s LVOT VTI:          0.196 m LVOT/AV VTI ratio: 0.36  AORTA Ao Root diam: 3.40 cm Ao Asc diam:  3.40 cm  MITRAL VALVE MV Area (PHT): 3.60 cm     SHUNTS MV Mean grad:  2.7 mmHg     Systemic VTI:  0.20 m MV Decel Time: 211 msec     Systemic Diam: 2.20 cm MR Peak grad: 112.6 mmHg MR Mean grad: 62.0 mmHg MR Vmax:      530.50 cm/s MR Vmean:     362.0 cm/s MV E velocity: 100.00 cm/s MV A velocity: 123.00 cm/s MV E/A ratio:  0.81  Stanly Leavens MD Electronically signed by Stanly Leavens MD Signature Date/Time: 07/17/2024/1:45:56 PM    Final   TEE  ECHO TEE 06/20/2020  Narrative TRANSESOPHOGEAL ECHO REPORT    Patient Name:   EDVARDO HONSE Date of Exam: 06/20/2020 Medical Rec #:  [id number]     Height:       66.0 in Accession #:    [id number]    Weight:       206.8 lb Date of Birth:  Oct 28, 1955     BSA:          2.028 m Patient Age:    63 years      BP:           110/100 mmHg Patient Gender: M             HR:           121 bpm. Exam Location:  Inpatient  Procedure: Transesophageal Echo, 3D Echo, Cardiac Doppler and Color Doppler  Indications:     atrial fibrillation  History:         Patient has prior history of Echocardiogram examinations, most recent 06/19/2020.  Sonographer:     Tinnie Barefoot Referring Phys:  [id number] OPWIDJB B ROBERTS Diagnosing Phys: Oneil Parchment MD  PROCEDURE: After discussion of the risks and benefits of a TEE, an informed consent was obtained from the patient. The transesophogeal probe was passed without difficulty through  the esophogus of the patient. Sedation performed by different physician. The patient was monitored while under deep sedation. Anesthestetic sedation was provided intravenously by Anesthesiology: 112mg  of Propofol . The patient developed no complications during the procedure. A direct current cardioversion was performed.  IMPRESSIONS   1. Left ventricular ejection fraction, by estimation, is 30 to 35%. The left ventricle has moderately decreased function. 2. Right ventricular systolic function is  normal. The right ventricular size is normal. 3. Left atrial size was severely dilated. No left atrial/left atrial appendage thrombus was detected. 4. Right atrial size was severely dilated. 5. 3D images of mitral valve taken. The mitral valve is degenerative. Moderate mitral valve regurgitation. 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 6. The aortic valve is tricuspid. Aortic valve regurgitation is not visualized. Mild aortic valve sclerosis is present, with no evidence of aortic valve stenosis.  FINDINGS Left Ventricle: Left ventricular ejection fraction, by estimation, is 30 to 35%. The left ventricle has moderately decreased function. The left ventricular internal cavity size was normal in size.  Right Ventricle: The right ventricular size is normal. No increase in right ventricular wall thickness. Right ventricular systolic function is normal.  Left Atrium: Left atrial size was severely dilated. No left atrial/left atrial appendage thrombus was detected.  Right Atrium: Right atrial size was severely dilated.  Pericardium: There is no evidence of pericardial effusion.  Mitral Valve: 3D images of mitral valve taken. The mitral valve is degenerative in appearance. 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 There is mild thickening of the mitral valve leaflet(s). There  is mild calcification of the mitral valve leaflet(s). Mildly decreased mobility of the mitral valve posterior leaflet. Mild mitral annular calcification. Moderate mitral valve regurgitation.  Tricuspid Valve: The tricuspid valve is normal in structure. Tricuspid valve regurgitation is mild.  Aortic Valve: The aortic valve is tricuspid. Aortic valve regurgitation is not visualized. Mild aortic valve sclerosis is present, with no evidence of aortic valve stenosis.  Pulmonic Valve: The pulmonic valve was not assessed. Pulmonic valve regurgitation is not visualized.  Aorta: The aortic root is normal in size and structure.  IAS/Shunts: No atrial level shunt detected by color flow Doppler.  Oneil Parchment MD Electronically signed by Oneil Parchment MD Signature Date/Time: 06/20/2020/2:19:34 PM    Final  MONITORS  LONG TERM MONITOR (3-14 DAYS) 10/17/2020  Narrative Sinus with PACs/ PVCs Nonsustained ventricular tachycardia Nonsustained atrial tachycardia No afib       ______________________________________________________________________________________________     Recent Labs: 01/18/2024: ALT 38; Magnesium  2.1 08/01/2024: BUN 15; Creatinine, Ser 1.05; Hemoglobin 16.6; Platelets 239; Potassium 3.9; Sodium 143  Recent Lipid Panel    Component Value Date/Time   CHOL 160 01/18/2024 0920   TRIG 110 01/18/2024 0920   HDL 54 01/18/2024 0920   CHOLHDL 3.0 01/18/2024 0920   CHOLHDL 3.2 06/19/2020 0120   VLDL 9 06/19/2020 0120   LDLCALC 86 01/18/2024 0920    History of Present Illness    68 year old male with the above past medical history including heart failure with recovered EF, paroxysmal atrial fibrillation, moderate aortic stenosis, hypertension, hyperlipidemia, transaminitis in the setting of fatty liver disease, OSA, obesity, and tobacco use.  He was hospitalized in November 2021 in the setting of acute systolic heart failure in the setting of atrial fibrillation with RVR,  cardiogenic shock requiring milrinone .  Echocardiogram showed EF 20%.  Cardiac catheterization revealed normal coronary arteries.  Repeat echocardiogram in 11/2020 showed EF improved to 50 to 55%.  He has a history of persistent atrial fibrillation s/p DCCV in 06/2020 with early recurrence of atrial fibrillation s/p repeat DCCV in 08/2020.  He underwent atrial fibrillation ablation in 06/2022.  He has a history of moderate aortic stenosis.  He was last seen in the office on 01/18/2024 and was stable from  a cardiac standpoint. He was seen virtually on 06/14/2024 for preoperative cardiac evaluation and was doing well.  Most recent echocardiogram in 07/2024 for monitoring of aortic stenosis showed EF 55 to 60%, normal LV function, no RWMA, mild LVH, G1 DD, low normal RV systolic function, mild to moderate mitral valve regurgitation, severe MAC, mild to moderate aortic valve stenosis, mean gradient 17 mmHg.  He presents today for follow-up.  Since his last visit he has done well from a cardiac standpoint. He denies any chest pain, palpitations, dizziness, dyspnea, edema, pnd, orthopnea or weight gain. Overall, he reports feeling well.   Home Medications    Current Outpatient Medications  Medication Sig Dispense Refill   albuterol  (VENTOLIN  HFA) 108 (90 Base) MCG/ACT inhaler Inhale 1-2 puffs into the lungs every 8 (eight) hours as needed for wheezing or shortness of breath. 8.5 each 0   apixaban  (ELIQUIS ) 5 MG TABS tablet TAKE 1 TABLET (5 MG TOTAL) BY MOUTH 2 (TWO) TIMES DAILY. 60 tablet 0   atorvastatin  (LIPITOR) 80 MG tablet Take 80 mg by mouth every evening.     blood glucose meter kit and supplies KIT Dispense based on patient and insurance preference. Use up to two times daily as directed. 1 each 0   Blood Glucose Monitoring Suppl Supplies MISC 1 strip by Does not apply route 2 (two) times daily. 100 strip 0   celecoxib (CELEBREX) 200 MG capsule Take 1 capsule (200 mg total) by mouth 2 (two) times daily for 10  days. 20 capsule 0   Cholecalciferol (VITAMIN D ) 50 MCG (2000 UT) tablet Take 2,000 Units by mouth daily.     dapagliflozin  propanediol (FARXIGA ) 10 MG TABS tablet Take 10 mg by mouth daily.     ENTRESTO  49-51 MG Take 1 tablet by mouth 2 (two) times daily.     ezetimibe (ZETIA) 10 MG tablet Take 10 mg by mouth daily.     furosemide  (LASIX ) 80 MG tablet Take 80 mg by mouth daily.     metFORMIN (GLUCOPHAGE-XR) 500 MG 24 hr tablet Take 500 mg by mouth daily.     metoprolol  succinate (TOPROL -XL) 25 MG 24 hr tablet Take 25 mg by mouth daily.     Misc. Devices (GNP DIGITAL WEIGHT SCALE) MISC 1 Device by Does not apply route daily. Please provide patient with insurance approved scale ICD110 I50.2 1 each 0   NON FORMULARY Pt uses a cpap nightly     potassium chloride  SA (KLOR-CON  M20) 20 MEQ tablet TAKE 1 TABLET BY MOUTH DAILY. PLEASE CONTACT THE OFFICE TO SCHEDULE A FOLLOW UP APPT 1ST ATTEMPT 90 tablet 3   WIXELA INHUB 100-50 MCG/ACT AEPB Inhale 1 puff into the lungs 2 (two) times daily.     OZEMPIC , 0.25 OR 0.5 MG/DOSE, 2 MG/3ML SOPN Inject 0.5 mg into the skin every 7 (seven) days. (Patient not taking: Reported on 08/13/2024)     No current facility-administered medications for this visit.     Review of Systems    He denies chest pain, palpitations, dyspnea, pnd, orthopnea, n, v, dizziness, syncope, edema, weight gain, or early satiety. All other systems reviewed and are otherwise negative except as noted above.   Physical Exam    VS:  BP 128/86 (BP Location: Left Arm, Patient Position: Sitting, Cuff Size: Normal)   Pulse 73   Ht 5' 6 (1.676 m)   Wt 229 lb (103.9 kg)   SpO2 96%   BMI 36.96 kg/m   GEN: Well nourished, well  developed, in no acute distress. HEENT: normal. Neck: Supple, no JVD, carotid bruits, or masses. Cardiac: RRR, 2/6 murmur, no rubs, or gallops. No clubbing, cyanosis, edema.  Radials/DP/PT 2+ and equal bilaterally.  Respiratory:  Respirations regular and unlabored,  clear to auscultation bilaterally. GI: Soft, nontender, nondistended, BS + x 4. MS: no deformity or atrophy. Skin: warm and dry, no rash. Neuro:  Strength and sensation are intact. Psych: Normal affect.  Accessory Clinical Findings    ECG personally reviewed by me today - EKG Interpretation Date/Time:  Monday August 13 2024 11:15:04 EST Ventricular Rate:  73 PR Interval:  182 QRS Duration:  96 QT Interval:  412 QTC Calculation: 453 R Axis:   28  Text Interpretation: Normal sinus rhythm Nonspecific T wave abnormality When compared with ECG of 18-Jan-2024 08:44, Premature ventricular complexes are no longer Present Confirmed by Daneen Perkins ([id number]) on 08/13/2024 11:21:48 AM  - no acute changes.   Lab Results  Component Value Date   WBC 5.5 08/01/2024   HGB 16.6 08/01/2024   HCT 52.4 (H) 08/01/2024   MCV 86.0 08/01/2024   PLT 239 08/01/2024   Lab Results  Component Value Date   CREATININE 1.05 08/01/2024   BUN 15 08/01/2024   NA 143 08/01/2024   K 3.9 08/01/2024   CL 106 08/01/2024   CO2 26 08/01/2024   Lab Results  Component Value Date   ALT 38 01/18/2024   AST 40 01/18/2024   ALKPHOS 109 01/18/2024   BILITOT 0.5 01/18/2024   Lab Results  Component Value Date   CHOL 160 01/18/2024   HDL 54 01/18/2024   LDLCALC 86 01/18/2024   TRIG 110 01/18/2024   CHOLHDL 3.0 01/18/2024    Lab Results  Component Value Date   HGBA1C 6.0 (H) 08/01/2024    Assessment & Plan   1. Heart failure with recovered EF: Prior EF 20%. Cardiac catheterization revealed normal coronary arteries.  Most recent echocardiogram in 07/2024 for monitoring of aortic stenosis showed EF 55 to 60%, normal LV function, no RWMA, mild LVH, G1 DD, low normal RV systolic function, mild to moderate mitral valve regurgitation, severe MAC, mild to moderate aortic valve stenosis, mean gradient 17 mmHg. Euvolemic and well compensated on exam. Continue metoprolol , Entresto , Farxiga , and Lasix .  2.  Paroxysmal  atrial fibrillation: Maintaining NSR. Denies any palpitations, denies bleeding. Recent CBC, BMET stable. Continue metoprolol , Eliquis .   3. Moderate aortic stenosis: Mild to moderate on most recent echo, mean gradient 17 mmHg. Asymptomatic. Repeat echocardiogram recommended in 1 year (07/2025).  4. Hypertension: BP well controlled. Continue current antihypertensive regimen.   5. Hyperlipidemia: LDL was 86 in 01/2024.  Continue Lipitor.  6. OSA/obesity: Adherent to CPAP, he is overdue for follow-up with Dr. Shlomo, will arrange for follow-up.   7. Tobacco use: He occasionally smokes cigars. Full cessation advised.   8. Disposition: Follow-up with Dr. Shlomo for sleep. Follow-up in 6 months with Dr. Kate.        Perkins JAYSON Daneen, NP 08/13/2024, 11:50 AM

## 2024-09-03 ENCOUNTER — Other Ambulatory Visit: Payer: Self-pay | Admitting: Registered Nurse

## 2024-09-03 ENCOUNTER — Ambulatory Visit
Admission: RE | Admit: 2024-09-03 | Discharge: 2024-09-03 | Disposition: A | Discharge status: Home / Self Care | Source: Ambulatory Visit | Attending: Registered Nurse | Admitting: Registered Nurse

## 2024-09-03 DIAGNOSIS — R911 Solitary pulmonary nodule: Secondary | ICD-10-CM

## 2024-09-13 ENCOUNTER — Telehealth: Payer: Self-pay

## 2024-09-13 NOTE — Telephone Encounter (Signed)
 [name]

## 2024-09-19 ENCOUNTER — Encounter: Payer: Self-pay | Admitting: Cardiology

## 2024-09-19 ENCOUNTER — Ambulatory Visit: Attending: Cardiology | Admitting: Cardiology

## 2024-09-19 ENCOUNTER — Telehealth: Payer: Self-pay | Admitting: *Deleted

## 2024-09-19 VITALS — BP 126/80 | HR 84 | Ht 66.0 in | Wt 231.1 lb

## 2024-09-19 DIAGNOSIS — I5020 Unspecified systolic (congestive) heart failure: Secondary | ICD-10-CM

## 2024-09-19 DIAGNOSIS — I10 Essential (primary) hypertension: Secondary | ICD-10-CM | POA: Diagnosis not present

## 2024-09-19 DIAGNOSIS — I5022 Chronic systolic (congestive) heart failure: Secondary | ICD-10-CM

## 2024-09-19 DIAGNOSIS — I480 Paroxysmal atrial fibrillation: Secondary | ICD-10-CM

## 2024-09-19 DIAGNOSIS — I5042 Chronic combined systolic (congestive) and diastolic (congestive) heart failure: Secondary | ICD-10-CM

## 2024-09-19 DIAGNOSIS — I4819 Other persistent atrial fibrillation: Secondary | ICD-10-CM

## 2024-09-19 DIAGNOSIS — G4733 Obstructive sleep apnea (adult) (pediatric): Secondary | ICD-10-CM

## 2024-09-19 NOTE — Telephone Encounter (Addendum)
 Order placed to [employer] via community message.  Upon patient request DME selection is [facility]. Patient understands he will be contacted by [facility] to set up his cpap.

## 2024-09-19 NOTE — Telephone Encounter (Signed)
-----   Message from Wilbert Bihari sent at 09/19/2024  2:06 PM EST ----- Order an under the nose full face mask and needs an appt to get the mask fitted. He needs an appt with DME to be taught how to use the humidity

## 2024-09-19 NOTE — Progress Notes (Signed)
 Sleep Medicine CONSULT Note    Date:  09/19/2024   ID:  Christopher Wilkins., DOB Jun 11, 1956, MRN [id number]  PCP:  Leontine Cramp, NP  Cardiologist: Lonni LITTIE Nanas, MD   Chief Complaint  Patient presents with   New Patient (Initial Visit)    Obstructive sleep apnea    History of Present Illness:  Christopher Wilkins. is a 68 y.o. male who is being seen today for the evaluation of sleep apnea at the request of Lonni, MD.  This is a 68 year old male with a history of atrial fibrillation, diabetes, GERD, fatty liver, hypertension and chronic combined systolic/diastolic CHF.  He was seen by Dr. Nanas back in March 2022 and due to persistent atrial fibrillation and complaints of excessive daytime sleepiness with snoring, a home sleep study was ordered.  Home sleep study showed moderate obstructive sleep apnea with an AHI of 17.6/h and no significant central events.  There was no significant nocturnal hypoxemia and O2 sat nadir was 89%.  A ResMed auto CPAP from 4 to 15 cm H2O with heated humidity and mask of choice was ordered.  He is now referred for sleep consultation for treatment of obstructive sleep apnea.  He is doing well with his PAP device and thinks that he has gotten used to it.  He tolerates the nasal mask and feels the pressure is adequate.  Since going on PAP he feels rested in the am and has no significant daytime sleepiness. Occasionally he will take a nap for an hour.  He has a lot of mouth dryness to the point he will need to jump out of bed to get some water.  Also has dry nose.  He has not been able to figure out how to adjust his humidity in the device.  He denies any significant nasal congestion.  He does not think that he snores.  Patient denies any episodes of bruxism, restless legs, No gagging hallucinations or cataplectic events.    Past Medical History:  Diagnosis Date   Atrial fibrillation (HCC)    CHF (congestive heart failure) (HCC)    Diabetes (HCC)     Dyspnea    Elevated LFTs    Fatty liver    GERD (gastroesophageal reflux disease)    Hypertension    Insomnia    Pneumonia    Prediabetes    Sleep apnea    SOB (shortness of breath)    Tubular adenoma of colon    Vitamin D  deficiency     Past Surgical History:  Procedure Laterality Date   ATRIAL FIBRILLATION ABLATION N/A 06/23/2022   Procedure: ATRIAL FIBRILLATION ABLATION;  Surgeon: Inocencio Soyla Lunger, MD;  Location: [facility] INVASIVE CV LAB;  Service: Cardiovascular;  Laterality: N/A;   CARDIOVERSION N/A 06/20/2020   Procedure: CARDIOVERSION;  Surgeon: Jeffrie Oneil BROCKS, MD;  Location: [facility] ENDOSCOPY;  Service: Cardiovascular;  Laterality: N/A;   CARDIOVERSION N/A 08/20/2020   Procedure: CARDIOVERSION;  Surgeon: Cherrie Toribio SAUNDERS, MD;  Location: [facility] ENDOSCOPY;  Service: Cardiovascular;  Laterality: N/A;   CHOLECYSTECTOMY     CIRCUMCISION N/A 08/07/2024   Procedure: CIRCUMCISION, ADULT;  Surgeon: Lovie Arlyss LITTIE, MD;  Location: [facility] ORS;  Service: Urology;  Laterality: N/A;   COLONOSCOPY     OTHER SURGICAL HISTORY     Shot a couple of times   RIGHT/LEFT HEART CATH AND CORONARY ANGIOGRAPHY N/A 08/18/2020   Procedure: RIGHT/LEFT HEART CATH AND CORONARY ANGIOGRAPHY;  Surgeon: Cherrie Toribio SAUNDERS, MD;  Location: [facility] INVASIVE  CV LAB;  Service: Cardiovascular;  Laterality: N/A;   TEE WITHOUT CARDIOVERSION N/A 06/20/2020   Procedure: TRANSESOPHAGEAL ECHOCARDIOGRAM (TEE);  Surgeon: Jeffrie Oneil BROCKS, MD;  Location: [facility] ENDOSCOPY;  Service: Cardiovascular;  Laterality: N/A;   TEE WITHOUT CARDIOVERSION N/A 08/20/2020   Procedure: TRANSESOPHAGEAL ECHOCARDIOGRAM (TEE);  Surgeon: Cherrie Toribio SAUNDERS, MD;  Location: [facility] ENDOSCOPY;  Service: Cardiovascular;  Laterality: N/A;    Current Medications: Current Meds  Medication Sig   albuterol  (VENTOLIN  HFA) 108 (90 Base) MCG/ACT inhaler Inhale 1-2 puffs into the lungs every 8 (eight) hours as needed for wheezing or shortness of breath.   apixaban  (ELIQUIS )  5 MG TABS tablet TAKE 1 TABLET (5 MG TOTAL) BY MOUTH 2 (TWO) TIMES DAILY.   atorvastatin  (LIPITOR) 80 MG tablet Take 80 mg by mouth every evening.   blood glucose meter kit and supplies KIT Dispense based on patient and insurance preference. Use up to two times daily as directed.   Blood Glucose Monitoring Suppl Supplies MISC 1 strip by Does not apply route 2 (two) times daily.   Cholecalciferol (VITAMIN D ) 50 MCG (2000 UT) tablet Take 2,000 Units by mouth daily.   dapagliflozin  propanediol (FARXIGA ) 10 MG TABS tablet Take 10 mg by mouth daily.   ENTRESTO  49-51 MG Take 1 tablet by mouth 2 (two) times daily.   ezetimibe (ZETIA) 10 MG tablet Take 10 mg by mouth daily.   furosemide  (LASIX ) 80 MG tablet Take 80 mg by mouth daily.   metFORMIN (GLUCOPHAGE-XR) 500 MG 24 hr tablet Take 500 mg by mouth daily.   metoprolol  succinate (TOPROL -XL) 25 MG 24 hr tablet Take 25 mg by mouth daily.   Misc. Devices (GNP DIGITAL WEIGHT SCALE) MISC 1 Device by Does not apply route daily. Please provide patient with insurance approved scale ICD110 I50.2   NON FORMULARY Pt uses a cpap nightly   OZEMPIC , 0.25 OR 0.5 MG/DOSE, 2 MG/3ML SOPN Inject 0.5 mg into the skin every 7 (seven) days.   potassium chloride  SA (KLOR-CON  M20) 20 MEQ tablet TAKE 1 TABLET BY MOUTH DAILY. PLEASE CONTACT THE OFFICE TO SCHEDULE A FOLLOW UP APPT 1ST ATTEMPT   WIXELA INHUB 100-50 MCG/ACT AEPB Inhale 1 puff into the lungs 2 (two) times daily.    Allergies:   Codeine and Lisinopril    Social History   Socioeconomic History   Marital status: Single    Spouse name: Not on file   Number of children: Not on file   Years of education: Not on file   Highest education level: Not on file  Occupational History   Occupation: Retired  Tobacco Use   Smoking status: Some Days    Current packs/day: 0.00    Types: Cigarettes, Cigars    Last attempt to quit: 05/14/2020    Years since quitting: 4.3   Smokeless tobacco: Never   Tobacco comments:     2 cigar per week  Vaping Use   Vaping status: Never Used  Substance and Sexual Activity   Alcohol use: Not Currently    Comment: occasionally; once every 2 weeks   Drug use: Not Currently   Sexual activity: Not on file  Other Topics Concern   Not on file  Social History Narrative   Not on file   Social Drivers of Health   Financial Resource Strain: Low Risk  (04/01/2022)   Overall Financial Resource Strain (CARDIA)    Difficulty of Paying Living Expenses: Not hard at all  Food Insecurity: No Food Insecurity (04/01/2022)  Hunger Vital Sign    Worried About Running Out of Food in the Last Year: Never true    Ran Out of Food in the Last Year: Never true  Transportation Needs: No Transportation Needs (04/01/2022)   PRAPARE - Administrator, Civil Service (Medical): No    Lack of Transportation (Non-Medical): No  Physical Activity: Insufficiently Active (04/01/2022)   Exercise Vital Sign    Days of Exercise per Week: 3 days    Minutes of Exercise per Session: 30 min  Stress: No Stress Concern Present (04/01/2022)   [employer] of Occupational Health - Occupational Stress Questionnaire    Feeling of Stress : Not at all  Social Connections: Unknown (02/19/2022)   Received from [facility]   Social Network    Social Network: Not on file     Family History:  The patient's family history includes Cancer in his mother; High Cholesterol in his father; High blood pressure in his father; Obesity in his mother.   ROS:   Please see the history of present illness.    ROS All other systems reviewed and are negative.      No data to display             PHYSICAL EXAM:   VS:  BP 126/80 (BP Location: Left Arm, Patient Position: Sitting, Cuff Size: Large)   Pulse 84   Ht 5' 6 (1.676 m)   Wt 231 lb 1.6 oz (104.8 kg)   SpO2 94%   BMI 37.30 kg/m    GEN: Well nourished, well developed, in no acute distress  HEENT: normal  Neck: no JVD, carotid bruits, or  masses Cardiac: RRR; no murmurs, rubs, or gallops,no edema.  Intact distal pulses bilaterally.  Respiratory:  clear to auscultation bilaterally, normal work of breathing GI: soft, nontender, nondistended, + BS MS: no deformity or atrophy  Skin: warm and dry, no rash Neuro:  Alert and Oriented x 3, Strength and sensation are intact Psych: euthymic mood, full affect  Wt Readings from Last 3 Encounters:  09/19/24 231 lb 1.6 oz (104.8 kg)  08/13/24 229 lb (103.9 kg)  08/01/24 222 lb (100.7 kg)      Studies/Labs Reviewed:   HST< PAP compliance download  Recent Labs: 01/18/2024: ALT 38; Magnesium  2.1 08/01/2024: BUN 15; Creatinine, Ser 1.05; Hemoglobin 16.6; Platelets 239; Potassium 3.9; Sodium 143    CHA2DS2-VASc Score = 4   This indicates a 4.8% annual risk of stroke. The patient's score is based upon: CHF History: 1 HTN History: 1 Diabetes History: 1 Stroke History: 0 Vascular Disease History: 0 Age Score: 1 Gender Score: 0     ASSESSMENT:    1. OSA (obstructive sleep apnea)   2. Primary hypertension      PLAN:  In order of problems listed above: OSA - The patient is tolerating PAP therapy well without any problems. The PAP download performed by his DME was personally reviewed and interpreted by me today and showed an AHI of 4.4 /hr on auto CPAP from 4-15 cm H2O with 40% compliance in using more than 4 hours nightly.  The patient has been using and benefiting from PAP use and will continue to benefit from therapy.  - I encouraged him to be more compliant with his device -he is having a lot of problems with mouth dryness and is using a nasal mask.  He says that he knows he is breathing some through his mouth.  Recommend changing him to an  under the nose FFM to see if that helps -I have also told him to read his info booklet on humidity to adjust the humidity to help with nose and mouth dryness.  Hypertension - BP controlled on exam today - Continue Entresto  49-51 mg  twice daily, Toprol  XL 25 mg daily with as needed refills   Time Spent: 20 minutes total time of encounter, including 15 minutes spent in face-to-face patient care on the date of this encounter. This time includes coordination of care and counseling regarding above mentioned problem list. Remainder of non-face-to-face time involved reviewing chart documents/testing relevant to the patient encounter and documentation in the medical record. I have independently reviewed documentation from referring provider  Medication Adjustments/Labs and Tests Ordered: Current medicines are reviewed at length with the patient today.  Concerns regarding medicines are outlined above.  Medication changes, Labs and Tests ordered today are listed in the Patient Instructions below.  There are no Patient Instructions on file for this visit.  Followup with me in 3 months   Signed, Wilbert Bihari, MD  09/19/2024 1:58 PM    [facility] [address] [place], [place], [place]  [address] Phone: [phone]; Fax: [phone]

## 2024-09-19 NOTE — Patient Instructions (Signed)
 Medication Instructions:  Your physician recommends that you continue on your current medications as directed. Please refer to the Current Medication list given to you today.  *If you need a refill on your cardiac medications before your next appointment, please call your pharmacy*  Lab Work: None If you have labs (blood work) drawn today and your tests are completely normal, you will receive your results only by: MyChart Message (if you have MyChart) OR A paper copy in the mail If you have any lab test that is abnormal or we need to change your treatment, we will call you to review the results.  Testing/Procedures: None  Follow-Up: At [facility], you and your health needs are our priority.  As part of our continuing mission to provide you with exceptional heart care, our providers are all part of one team.  This team includes your primary Cardiologist (physician) and Advanced Practice Providers or APPs (Physician Assistants and Nurse Practitioners) who all work together to provide you with the care you need, when you need it.  Your next appointment:   3 month(s)  Provider:   Dr. Wilbert Bihari

## 2024-10-08 NOTE — Telephone Encounter (Signed)
 Fax came from [employer] stating [employer] could not reach the patient. There is a typo in the patients phone number on the fax. [employer] has [phone] as the number they have been calling. The correct number is [phone] to reach the patient. This communication hs been sent to Adina Kerns at [employer].

## 2024-10-22 NOTE — Progress Notes (Unsigned)
 "  SUBJECTIVE: Discussed the use of AI scribe software for clinical note transcription with the patient, who gave verbal consent to proceed.  Chief Complaint: Obesity  Interim History: He is down 8 lbs since his last visit in August 2025.     Down 32 lbs overall    TBW loss of 12.6%  Last in OV 05/16/2024 Underwent circumcision 08/07/2024   Christopher Wilkins is here to discuss his progress with his obesity treatment plan. He is on the Category 2 Plan and states he is following his eating plan approximately 75 % of the time. He states he is exercising walking 90 minutes 7 times per week.  Christopher Wilkins. is a 69 year old male who presents for follow-up on his obesity treatment plan.  He has lost a total of 32 pounds since starting his treatment plan, with an additional 8 pounds lost since his last visit in August 2025. He reports consuming protein, drinking enough water, and not skipping meals. He maintains a regular sleep schedule of at least seven hours per night and engages in daily physical activity, walking one and a half hours each day. His daily diet includes a morning sandwich or egg sandwich with bacon or sausage, a turkey sandwich with grapes at noon, and fish with strawberries in the evening. He drinks two cups of milk daily but does not consume many vegetables. He walks with his sister daily.  He underwent a circumcision due to phimosis since his last visit. Post-surgery, he reports erectile dysfunction, stating 'it ain't working no more' despite following post-operative instructions to abstain from sexual activity for a month and a half to two months. He has not used medications like Viagra or Cialis before and has not experienced pain post-surgery, thus did not take prescribed pain medication. He saw urology in follow up and it was recommended he see cardiology prior to considering medication for ED.   He is currently on Farxiga  10 mg daily, Metformin 500 mg daily, and Ozempic  0.25 mg once weekly  for type 2 diabetes. He is also on Entresto  and Farxiga  for chronic heart failure and is anticoagulated with Eliquis . For lipid management, he takes Atorvastatin  80 mg daily and Zetia 10 mg daily. He continues to take vitamin D  supplements.  He reports experiencing 'crazy dreams' and blurry vision, despite having two different sets of glasses. He uses a CPAP machine at night but reports issues with its operation and lack of guidance on its use. He typically wears the CPAP from around 6:30 PM to 2:30-3:00 AM, removing it only to use the restroom. He has not had a recent follow-up for CPAP adjustments.  OBJECTIVE: Visit Diagnoses: Problem List Items Addressed This Visit     CHF (congestive heart failure) (HCC)   Type 2 diabetes mellitus with other specified complication (HCC) - Primary   Vitamin D  deficiency   Obesity- Start BMI 38.47   Other Visit Diagnoses       OSA on CPAP         BMI 33.0-33.9,adult Current BMI 33.6         Obesity Continued weight loss with an 8-pound reduction since the last visit, totaling a 32-pound loss. He is adhering to a nutrition plan and engaging in regular physical activity, walking 1.5 hours daily. He is satisfied with his current diet, which includes lean meats and fruits, but needs to increase vegetable intake. - Continue current nutrition plan and physical activity regimen - Encouraged increased vegetable intake  Type 2  diabetes mellitus Managed with Farxiga , metformin, and Ozempic . He is on a low dose of Ozempic  (0.25 mg) and reports doing well on the current regimen. Blood glucose levels are well-controlled. Lab Results  Component Value Date   HGBA1C 6.0 (H) 08/01/2024   HGBA1C 6.0 (H) 12/23/2022   HGBA1C 6.5 (H) 12/31/2021   Lab Results  Component Value Date   MICROALBUR 0.84 04/03/2010   LDLCALC 86 01/18/2024   CREATININE 1.05 08/01/2024   A1c was at goal.  - Continue Farxiga , metformin, and Ozempic  as prescribed by PCP.  He is working  on  engineer, technical sales to decrease simple carbohydrates, increase lean proteins and exercise to promote weight loss and improve glycemic control .   Heart failure Managed with Entresto  and Farxiga . He has not seen his cardiologist recently and needs follow-up to ensure medication efficacy and address any potential issues with his CPAP machine in addition to any concerns for possible medication use for ED. He reports issues with CPAP machine adjustment and usage, which may contribute to sleep disturbances. Dr. IVAR Bihari had ordered CPAP in past.  - Referred to cardiologist for follow-up on heart failure management and CPAP machine adjustment - Continue Entresto  and Farxiga  as prescribed  Obstructive sleep apnea Managed with CPAP. He reports issues with CPAP machine adjustment and usage, which may contribute to sleep disturbances and vivid dreams. He has not received adequate instruction on CPAP use. - Discuss CPAP machine adjustment and usage with cardiologist - Ensure proper CPAP machine adjustment and usage  Vitamin D  Deficiency Vitamin D  is not at goal of 50.  Most recent vitamin D  level was 39.7. He is on OTC vitamin D3 2000 IU daily. Lab Results  Component Value Date   VD25OH 39.7 12/23/2022   VD25OH 27.3 (L) 12/31/2021    Plan: Continue OTC vitamin D3 2000 IU daily Low vitamin D  levels can be associated with adiposity and may result in leptin resistance and weight gain. Also associated with fatigue.  Currently on vitamin D  supplementation without any adverse effects such as nausea, vomiting or muscle weakness.  Recheck vitamin D  level next OV along with any other indicated labs.   Vitals Temp: 98.2 F (36.8 C) BP: 103/69 Pulse Rate: 78 SpO2: 98 %   Anthropometric Measurements Height: 5' 8 (1.727 m) Weight: 221 lb (100.2 kg) BMI (Calculated): 33.61 Weight at Last Visit: 229 lb Weight Lost Since Last Visit: 8 lb Weight Gained Since Last Visit: 0 Starting Weight: 253 lb Total  Weight Loss (lbs): 32 lb (14.5 kg) Peak Weight: 275 lb Waist Measurement : 48 inches   Body Composition  Body Fat %: 31.8 % Fat Mass (lbs): 70.2 lbs Muscle Mass (lbs): 143.4 lbs Total Body Water (lbs): 101.6 lbs Visceral Fat Rating : 20   Other Clinical Data Today's Visit #: 98 Starting Date: 12/31/21     ASSESSMENT AND PLAN:  Diet: Christopher Wilkins is currently in the action stage of change. As such, his goal is to continue with weight loss efforts. He has agreed to Category 2 Plan.  Exercise: Christopher Wilkins has been instructed to continue exercising as is for weight loss and overall health benefits.   Behavior Modification:  We discussed the following Behavioral Modification Strategies today: increasing lean protein intake, decreasing simple carbohydrates, increasing vegetables, increase H2O intake, increase high fiber foods, meal planning and cooking strategies, avoiding temptations, and planning for success. We discussed various medication options to help Christopher Wilkins with his weight loss efforts and we both agreed to continue  Ozempic  0.25 mg weekly and Farxiga   and metformin for T2DM management and medical weight loss.  Return in about 6 weeks (around 12/04/2024).[name] He was informed of the importance of frequent follow up visits to maximize his success with intensive lifestyle modifications for his multiple health conditions.  Attestation Statements:   Reviewed by clinician on day of visit: allergies, medications, problem list, medical history, surgical history, family history, social history, and previous encounter notes.   Time spent on visit including pre-visit chart review and post-visit care and charting was 31 minutes.    Donyell Ding, PA-C  "

## 2024-10-23 ENCOUNTER — Ambulatory Visit (INDEPENDENT_AMBULATORY_CARE_PROVIDER_SITE_OTHER): Admitting: Physician Assistant

## 2024-10-23 ENCOUNTER — Encounter (INDEPENDENT_AMBULATORY_CARE_PROVIDER_SITE_OTHER): Payer: Self-pay | Admitting: Physician Assistant

## 2024-10-23 VITALS — BP 103/69 | HR 78 | Temp 98.2°F | Ht 68.0 in | Wt 221.0 lb

## 2024-10-23 DIAGNOSIS — E559 Vitamin D deficiency, unspecified: Secondary | ICD-10-CM

## 2024-10-23 DIAGNOSIS — E1169 Type 2 diabetes mellitus with other specified complication: Secondary | ICD-10-CM

## 2024-10-23 DIAGNOSIS — Z6833 Body mass index (BMI) 33.0-33.9, adult: Secondary | ICD-10-CM

## 2024-10-23 DIAGNOSIS — I509 Heart failure, unspecified: Secondary | ICD-10-CM

## 2024-10-23 DIAGNOSIS — G4733 Obstructive sleep apnea (adult) (pediatric): Secondary | ICD-10-CM

## 2024-10-23 DIAGNOSIS — E669 Obesity, unspecified: Secondary | ICD-10-CM

## 2024-10-25 ENCOUNTER — Telehealth: Payer: Self-pay | Admitting: Cardiology

## 2024-10-25 DIAGNOSIS — I4819 Other persistent atrial fibrillation: Secondary | ICD-10-CM

## 2024-10-25 DIAGNOSIS — I480 Paroxysmal atrial fibrillation: Secondary | ICD-10-CM

## 2024-10-25 DIAGNOSIS — I10 Essential (primary) hypertension: Secondary | ICD-10-CM

## 2024-10-25 DIAGNOSIS — I5022 Chronic systolic (congestive) heart failure: Secondary | ICD-10-CM

## 2024-10-25 DIAGNOSIS — G4733 Obstructive sleep apnea (adult) (pediatric): Secondary | ICD-10-CM

## 2024-10-25 DIAGNOSIS — I5042 Chronic combined systolic (congestive) and diastolic (congestive) heart failure: Secondary | ICD-10-CM

## 2024-10-25 DIAGNOSIS — I5020 Unspecified systolic (congestive) heart failure: Secondary | ICD-10-CM

## 2024-10-25 NOTE — Telephone Encounter (Signed)
 The patient states that since his circumcision, he has been unable to get an erection. He is asking if Dr. Kate can prescribe medication to help with this, or if it would be safe to take a medication prescribed by another provider with his current medications. This will be forwarded to Dr. Kate for advisement.

## 2024-10-25 NOTE — Telephone Encounter (Signed)
 Patient wants a call back to discuss issue with circumcision procedure done.

## 2024-10-25 NOTE — Telephone Encounter (Signed)
 Patient stated he wants a call back regarding instructions for his CPAP machine.

## 2024-10-26 NOTE — Telephone Encounter (Signed)
 Yes that is okay to take an ED medication on the medications he is on.  These are not medications that we prescribe but would follow-up with his PCP

## 2024-10-26 NOTE — Telephone Encounter (Signed)
 Spoke with pt. Recommendations given. Pt stated understanding.

## 2024-10-29 NOTE — Telephone Encounter (Signed)
 Reached out to patient and he says he does not know how to use his machine. He says the air is blowing too hard and he has to take the mask off at night. He states his machine came in the mail and he needs help with operating it. He states he got his machine from [employer]. I will message Adina Kerns and ask him to have someone contact the patient and assist him with operating it. The patient is willing to take the machine into the retail store.

## 2024-10-31 NOTE — Telephone Encounter (Signed)
 Per Dr Shlomo, Change auto CPAP to CPAP at 9cm H2O and get a download in 4 weeks    Order placed to [employer] via community message.

## 2024-11-01 ENCOUNTER — Other Ambulatory Visit: Payer: Self-pay | Admitting: Cardiology

## 2024-12-05 ENCOUNTER — Ambulatory Visit (INDEPENDENT_AMBULATORY_CARE_PROVIDER_SITE_OTHER): Admitting: Physician Assistant
